# Patient Record
Sex: Male | Born: 1940
Health system: Southern US, Community
[De-identification: ages and names within clinical notes are randomized; demographics above are authoritative.]

## PROBLEM LIST (undated history)

## (undated) DIAGNOSIS — G5620 Lesion of ulnar nerve, unspecified upper limb: Secondary | ICD-10-CM

## (undated) DIAGNOSIS — R0789 Other chest pain: Secondary | ICD-10-CM

## (undated) DIAGNOSIS — L0291 Cutaneous abscess, unspecified: Secondary | ICD-10-CM

## (undated) DIAGNOSIS — R935 Abnormal findings on diagnostic imaging of other abdominal regions, including retroperitoneum: Secondary | ICD-10-CM

## (undated) DIAGNOSIS — D649 Anemia, unspecified: Secondary | ICD-10-CM

## (undated) DIAGNOSIS — Z136 Encounter for screening for cardiovascular disorders: Secondary | ICD-10-CM

## (undated) DIAGNOSIS — M1990 Unspecified osteoarthritis, unspecified site: Secondary | ICD-10-CM

## (undated) DIAGNOSIS — M6200 Separation of muscle (nontraumatic), unspecified site: Secondary | ICD-10-CM

## (undated) DIAGNOSIS — S53106A Unspecified dislocation of unspecified ulnohumeral joint, initial encounter: Secondary | ICD-10-CM

## (undated) DIAGNOSIS — H269 Unspecified cataract: Secondary | ICD-10-CM

## (undated) DIAGNOSIS — L0390 Cellulitis, unspecified: Secondary | ICD-10-CM

## (undated) DIAGNOSIS — E119 Type 2 diabetes mellitus without complications: Secondary | ICD-10-CM

## (undated) DIAGNOSIS — Z9889 Other specified postprocedural states: Secondary | ICD-10-CM

## (undated) DIAGNOSIS — T783XXA Angioneurotic edema, initial encounter: Secondary | ICD-10-CM

## (undated) DIAGNOSIS — I10 Essential (primary) hypertension: Secondary | ICD-10-CM

## (undated) DIAGNOSIS — Z01818 Encounter for other preprocedural examination: Secondary | ICD-10-CM

## (undated) HISTORY — DX: Cellulitis, unspecified: L03.90

## (undated) HISTORY — DX: Essential (primary) hypertension: I10

## (undated) HISTORY — DX: Unspecified dislocation of unspecified ulnohumeral joint, initial encounter: S53.106A

## (undated) HISTORY — DX: Encounter for other preprocedural examination: Z01.818

## (undated) HISTORY — DX: Other specified postprocedural states: Z98.890

## (undated) HISTORY — DX: Cutaneous abscess, unspecified: L02.91

## (undated) HISTORY — DX: Abnormal findings on diagnostic imaging of other abdominal regions, including retroperitoneum: R93.5

## (undated) HISTORY — DX: Angioneurotic edema, initial encounter: T78.3XXA

## (undated) HISTORY — DX: Separation of muscle (nontraumatic), unspecified site: M62.00

## (undated) HISTORY — DX: Other chest pain: R07.89

## (undated) HISTORY — DX: Lesion of ulnar nerve, unspecified upper limb: G56.20

## (undated) HISTORY — DX: Unspecified osteoarthritis, unspecified site: M19.90

## (undated) HISTORY — DX: Anemia, unspecified: D64.9

## (undated) HISTORY — DX: Encounter for screening for cardiovascular disorders: Z13.6

## (undated) HISTORY — DX: Unspecified cataract: H26.9

---
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
## 1968-04-29 HISTORY — PX: INGUINAL HERNIA REPAIR: SUR1180

## 1996-08-27 HISTORY — PX: ANTERIOR CRUCIATE LIGAMENT REPAIR: SHX115

## 1997-09-06 ENCOUNTER — Encounter: Admission: RE | Admit: 1997-09-06 | Discharge: 1997-09-06 | Payer: Self-pay | Admitting: Family Medicine

## 1998-01-04 ENCOUNTER — Encounter: Admission: RE | Admit: 1998-01-04 | Discharge: 1998-01-04 | Payer: Self-pay | Admitting: Family Medicine

## 1998-02-21 ENCOUNTER — Encounter: Admission: RE | Admit: 1998-02-21 | Discharge: 1998-02-21 | Payer: Self-pay | Admitting: Sports Medicine

## 1998-04-11 ENCOUNTER — Encounter: Admission: RE | Admit: 1998-04-11 | Discharge: 1998-04-11 | Payer: Self-pay | Admitting: Family Medicine

## 1998-07-05 ENCOUNTER — Encounter: Admission: RE | Admit: 1998-07-05 | Discharge: 1998-07-05 | Payer: Self-pay | Admitting: Family Medicine

## 1999-08-03 ENCOUNTER — Encounter: Admission: RE | Admit: 1999-08-03 | Discharge: 1999-08-03 | Payer: Self-pay | Admitting: Family Medicine

## 1999-08-07 ENCOUNTER — Encounter: Admission: RE | Admit: 1999-08-07 | Discharge: 1999-08-07 | Payer: Self-pay | Admitting: Family Medicine

## 1999-08-07 ENCOUNTER — Ambulatory Visit (HOSPITAL_COMMUNITY): Admission: RE | Admit: 1999-08-07 | Discharge: 1999-08-07 | Payer: Self-pay | Admitting: Family Medicine

## 1999-08-17 ENCOUNTER — Emergency Department (HOSPITAL_COMMUNITY): Admission: EM | Admit: 1999-08-17 | Discharge: 1999-08-17 | Payer: Self-pay

## 1999-08-23 ENCOUNTER — Encounter: Admission: RE | Admit: 1999-08-23 | Discharge: 1999-08-23 | Payer: Self-pay | Admitting: Family Medicine

## 1999-09-05 ENCOUNTER — Encounter: Admission: RE | Admit: 1999-09-05 | Discharge: 1999-09-05 | Payer: Self-pay | Admitting: Family Medicine

## 1999-09-19 ENCOUNTER — Encounter: Admission: RE | Admit: 1999-09-19 | Discharge: 1999-09-19 | Payer: Self-pay | Admitting: Family Medicine

## 2000-01-01 ENCOUNTER — Encounter: Admission: RE | Admit: 2000-01-01 | Discharge: 2000-01-01 | Payer: Self-pay | Admitting: Family Medicine

## 2000-02-26 ENCOUNTER — Encounter: Admission: RE | Admit: 2000-02-26 | Discharge: 2000-02-26 | Payer: Self-pay | Admitting: Family Medicine

## 2000-05-06 ENCOUNTER — Encounter: Admission: RE | Admit: 2000-05-06 | Discharge: 2000-05-06 | Payer: Self-pay | Admitting: Family Medicine

## 2000-11-21 ENCOUNTER — Encounter: Admission: RE | Admit: 2000-11-21 | Discharge: 2000-11-21 | Payer: Self-pay | Admitting: Family Medicine

## 2001-01-13 ENCOUNTER — Encounter: Admission: RE | Admit: 2001-01-13 | Discharge: 2001-01-13 | Payer: Self-pay | Admitting: Family Medicine

## 2001-05-12 ENCOUNTER — Encounter: Admission: RE | Admit: 2001-05-12 | Discharge: 2001-05-12 | Payer: Self-pay | Admitting: Family Medicine

## 2001-05-26 ENCOUNTER — Encounter: Admission: RE | Admit: 2001-05-26 | Discharge: 2001-05-26 | Payer: Self-pay | Admitting: Family Medicine

## 2001-07-14 ENCOUNTER — Encounter: Admission: RE | Admit: 2001-07-14 | Discharge: 2001-07-14 | Payer: Self-pay | Admitting: Family Medicine

## 2001-08-18 ENCOUNTER — Encounter: Admission: RE | Admit: 2001-08-18 | Discharge: 2001-08-18 | Payer: Self-pay | Admitting: Family Medicine

## 2002-03-11 ENCOUNTER — Encounter: Admission: RE | Admit: 2002-03-11 | Discharge: 2002-03-11 | Payer: Self-pay | Admitting: Family Medicine

## 2002-06-22 ENCOUNTER — Encounter: Admission: RE | Admit: 2002-06-22 | Discharge: 2002-06-22 | Payer: Self-pay | Admitting: Family Medicine

## 2002-06-28 DIAGNOSIS — Z136 Encounter for screening for cardiovascular disorders: Secondary | ICD-10-CM

## 2002-06-28 HISTORY — DX: Encounter for screening for cardiovascular disorders: Z13.6

## 2002-11-30 ENCOUNTER — Encounter: Admission: RE | Admit: 2002-11-30 | Discharge: 2002-11-30 | Payer: Self-pay | Admitting: Sports Medicine

## 2002-12-02 ENCOUNTER — Encounter: Admission: RE | Admit: 2002-12-02 | Discharge: 2002-12-02 | Payer: Self-pay | Admitting: Family Medicine

## 2003-01-05 ENCOUNTER — Encounter: Admission: RE | Admit: 2003-01-05 | Discharge: 2003-01-05 | Payer: Self-pay | Admitting: Family Medicine

## 2003-02-16 ENCOUNTER — Encounter: Admission: RE | Admit: 2003-02-16 | Discharge: 2003-02-16 | Payer: Self-pay | Admitting: Family Medicine

## 2003-08-09 ENCOUNTER — Encounter: Admission: RE | Admit: 2003-08-09 | Discharge: 2003-08-09 | Payer: Self-pay | Admitting: Family Medicine

## 2003-09-06 ENCOUNTER — Encounter: Admission: RE | Admit: 2003-09-06 | Discharge: 2003-09-06 | Payer: Self-pay | Admitting: Family Medicine

## 2004-02-28 DIAGNOSIS — R935 Abnormal findings on diagnostic imaging of other abdominal regions, including retroperitoneum: Secondary | ICD-10-CM

## 2004-02-28 HISTORY — DX: Abnormal findings on diagnostic imaging of other abdominal regions, including retroperitoneum: R93.5

## 2004-03-13 ENCOUNTER — Ambulatory Visit: Payer: Self-pay | Admitting: Family Medicine

## 2004-03-14 ENCOUNTER — Encounter: Admission: RE | Admit: 2004-03-14 | Discharge: 2004-03-14 | Payer: Self-pay | Admitting: Family Medicine

## 2004-04-03 ENCOUNTER — Ambulatory Visit: Payer: Self-pay | Admitting: Sports Medicine

## 2004-04-17 ENCOUNTER — Ambulatory Visit: Payer: Self-pay | Admitting: Family Medicine

## 2004-05-29 ENCOUNTER — Ambulatory Visit: Payer: Self-pay | Admitting: Family Medicine

## 2004-06-08 ENCOUNTER — Ambulatory Visit (HOSPITAL_COMMUNITY): Admission: RE | Admit: 2004-06-08 | Discharge: 2004-06-08 | Payer: Self-pay | Admitting: Gastroenterology

## 2004-07-13 ENCOUNTER — Ambulatory Visit: Payer: Self-pay | Admitting: Family Medicine

## 2004-07-17 ENCOUNTER — Ambulatory Visit: Payer: Self-pay | Admitting: Family Medicine

## 2004-10-22 DIAGNOSIS — Z01818 Encounter for other preprocedural examination: Secondary | ICD-10-CM

## 2004-10-22 HISTORY — DX: Encounter for other preprocedural examination: Z01.818

## 2004-11-13 ENCOUNTER — Ambulatory Visit: Payer: Self-pay | Admitting: Family Medicine

## 2005-02-27 HISTORY — PX: BASAL CELL CARCINOMA EXCISION: SHX1214

## 2005-03-29 HISTORY — PX: BLEPHAROPLASTY: SUR158

## 2005-04-02 ENCOUNTER — Ambulatory Visit: Payer: Self-pay | Admitting: Family Medicine

## 2005-06-04 ENCOUNTER — Ambulatory Visit: Payer: Self-pay | Admitting: Family Medicine

## 2005-10-08 ENCOUNTER — Ambulatory Visit: Payer: Self-pay | Admitting: Family Medicine

## 2005-12-02 ENCOUNTER — Ambulatory Visit (HOSPITAL_BASED_OUTPATIENT_CLINIC_OR_DEPARTMENT_OTHER): Admission: RE | Admit: 2005-12-02 | Discharge: 2005-12-02 | Payer: Self-pay | Admitting: Surgery

## 2005-12-13 ENCOUNTER — Ambulatory Visit: Payer: Self-pay | Admitting: Family Medicine

## 2006-01-22 ENCOUNTER — Ambulatory Visit: Payer: Self-pay | Admitting: Family Medicine

## 2006-03-05 ENCOUNTER — Encounter: Admission: RE | Admit: 2006-03-05 | Discharge: 2006-03-05 | Payer: Self-pay | Admitting: Otolaryngology

## 2006-03-29 LAB — CONVERTED CEMR LAB: PSA: NORMAL ng/mL

## 2006-04-08 ENCOUNTER — Ambulatory Visit: Payer: Self-pay | Admitting: Family Medicine

## 2006-06-26 DIAGNOSIS — N401 Enlarged prostate with lower urinary tract symptoms: Secondary | ICD-10-CM | POA: Insufficient documentation

## 2006-06-26 DIAGNOSIS — E1143 Type 2 diabetes mellitus with diabetic autonomic (poly)neuropathy: Secondary | ICD-10-CM | POA: Insufficient documentation

## 2006-06-26 DIAGNOSIS — K219 Gastro-esophageal reflux disease without esophagitis: Secondary | ICD-10-CM | POA: Insufficient documentation

## 2006-06-26 DIAGNOSIS — D649 Anemia, unspecified: Secondary | ICD-10-CM | POA: Insufficient documentation

## 2006-06-26 DIAGNOSIS — E7800 Pure hypercholesterolemia, unspecified: Secondary | ICD-10-CM | POA: Insufficient documentation

## 2006-06-26 DIAGNOSIS — T783XXA Angioneurotic edema, initial encounter: Secondary | ICD-10-CM | POA: Insufficient documentation

## 2006-07-01 ENCOUNTER — Ambulatory Visit: Payer: Self-pay | Admitting: Family Medicine

## 2006-07-01 LAB — CONVERTED CEMR LAB: Hgb A1c MFr Bld: 5.3 %

## 2006-07-31 ENCOUNTER — Telehealth: Payer: Self-pay | Admitting: *Deleted

## 2006-11-11 ENCOUNTER — Telehealth: Payer: Self-pay | Admitting: Family Medicine

## 2006-11-14 ENCOUNTER — Ambulatory Visit: Payer: Self-pay | Admitting: Family Medicine

## 2006-11-17 LAB — CONVERTED CEMR LAB
ALT: 19 units/L (ref 0–53)
AST: 20 units/L (ref 0–37)
Albumin: 4.6 g/dL (ref 3.5–5.2)
Alkaline Phosphatase: 46 units/L (ref 39–117)
BUN: 20 mg/dL (ref 6–23)
CO2: 25 meq/L (ref 19–32)
Calcium: 9.3 mg/dL (ref 8.4–10.5)
Chloride: 106 meq/L (ref 96–112)
Cholesterol: 175 mg/dL (ref 0–200)
Creatinine, Ser: 1.29 mg/dL (ref 0.40–1.50)
Folate: >20 ng/mL
Glucose, Bld: 117 mg/dL — ABNORMAL HIGH (ref 70–99)
HCT: 34.4 % — ABNORMAL LOW (ref 39.0–52.0)
HDL: 39 mg/dL — ABNORMAL LOW (ref >39)
Hemoglobin: 11.5 g/dL — ABNORMAL LOW (ref 13.0–17.0)
LDL Cholesterol: 65 mg/dL (ref 0–99)
MCHC: 33.4 g/dL (ref 30.0–36.0)
MCV: 98.6 fL (ref 78.0–100.0)
Platelets: 164 10*3/uL (ref 150–400)
Potassium: 4.1 meq/L (ref 3.5–5.3)
RBC: 3.49 M/uL — ABNORMAL LOW (ref 4.22–5.81)
RDW: 14.8 % — ABNORMAL HIGH (ref 11.5–14.0)
Retic Count, Absolute: 62.8 (ref 19.0–186.0)
Retic Ct Pct: 1.8 % (ref 0.4–3.1)
Sodium: 140 meq/L (ref 135–145)
TSH: 1.185 microintl units/mL (ref 0.350–5.50)
Total Bilirubin: 0.7 mg/dL (ref 0.3–1.2)
Total CHOL/HDL Ratio: 4.5
Total Protein: 7.1 g/dL (ref 6.0–8.3)
Triglycerides: 355 mg/dL — ABNORMAL HIGH (ref <150)
VLDL: 71 mg/dL — ABNORMAL HIGH (ref 0–40)
Vitamin B-12: 166 pg/mL — ABNORMAL LOW (ref 211–911)
WBC: 3.8 10*3/uL — ABNORMAL LOW (ref 4.0–10.5)

## 2006-11-18 ENCOUNTER — Ambulatory Visit: Payer: Self-pay | Admitting: Family Medicine

## 2006-11-18 ENCOUNTER — Encounter: Payer: Self-pay | Admitting: *Deleted

## 2006-11-18 ENCOUNTER — Encounter: Payer: Self-pay | Admitting: Family Medicine

## 2006-11-18 LAB — CONVERTED CEMR LAB
Homocysteine: 12.2 micromoles/L (ref 4.0–15.4)
Vitamin B-12: 227 pg/mL (ref 211–911)

## 2006-12-02 ENCOUNTER — Ambulatory Visit: Payer: Self-pay | Admitting: Family Medicine

## 2006-12-02 LAB — CONVERTED CEMR LAB: Hgb A1c MFr Bld: 5.4 %

## 2006-12-03 ENCOUNTER — Encounter: Payer: Self-pay | Admitting: Family Medicine

## 2006-12-03 ENCOUNTER — Telehealth: Payer: Self-pay | Admitting: *Deleted

## 2006-12-04 ENCOUNTER — Ambulatory Visit: Payer: Self-pay | Admitting: Oncology

## 2006-12-25 ENCOUNTER — Encounter: Payer: Self-pay | Admitting: Family Medicine

## 2006-12-25 LAB — CBC WITH DIFFERENTIAL/PLATELET
BASO%: 0.4 % (ref 0.0–2.0)
Basophils Absolute: 0 10*3/uL (ref 0.0–0.1)
EOS%: 1.8 % (ref 0.0–7.0)
Eosinophils Absolute: 0.1 10*3/uL (ref 0.0–0.5)
HCT: 30.9 % — ABNORMAL LOW (ref 38.7–49.9)
HGB: 11.2 g/dL — ABNORMAL LOW (ref 13.0–17.1)
LYMPH%: 32.2 % (ref 14.0–48.0)
MCH: 34 pg — ABNORMAL HIGH (ref 28.0–33.4)
MCHC: 36.3 g/dL — ABNORMAL HIGH (ref 32.0–35.9)
MCV: 93.7 fL (ref 81.6–98.0)
MONO#: 0.3 10*3/uL (ref 0.1–0.9)
MONO%: 8.1 % (ref 0.0–13.0)
NEUT#: 2.2 10*3/uL (ref 1.5–6.5)
NEUT%: 57.5 % (ref 40.0–75.0)
Platelets: 150 10*3/uL (ref 145–400)
RBC: 3.3 10*6/uL — ABNORMAL LOW (ref 4.20–5.71)
RDW: 13.4 % (ref 11.2–14.6)
WBC: 3.8 10*3/uL — ABNORMAL LOW (ref 4.0–10.0)
lymph#: 1.2 10*3/uL (ref 0.9–3.3)

## 2006-12-25 LAB — CHCC SMEAR

## 2006-12-30 LAB — COMPREHENSIVE METABOLIC PANEL
ALT: 23 U/L (ref 0–53)
AST: 22 U/L (ref 0–37)
Albumin: 4.5 g/dL (ref 3.5–5.2)
Alkaline Phosphatase: 46 U/L (ref 39–117)
BUN: 18 mg/dL (ref 6–23)
CO2: 25 mEq/L (ref 19–32)
Calcium: 9 mg/dL (ref 8.4–10.5)
Chloride: 107 mEq/L (ref 96–112)
Creatinine, Ser: 1.18 mg/dL (ref 0.40–1.50)
Glucose, Bld: 149 mg/dL — ABNORMAL HIGH (ref 70–99)
Potassium: 3.8 mEq/L (ref 3.5–5.3)
Sodium: 143 mEq/L (ref 135–145)
Total Bilirubin: 0.7 mg/dL (ref 0.3–1.2)
Total Protein: 6.7 g/dL (ref 6.0–8.3)

## 2006-12-30 LAB — IRON AND TIBC
%SAT: 28 % (ref 20–55)
Iron: 90 ug/dL (ref 42–165)
TIBC: 326 ug/dL (ref 215–435)
UIBC: 236 ug/dL

## 2006-12-30 LAB — IMMUNOFIXATION ELECTROPHORESIS
IgA: 118 mg/dL (ref 68–378)
IgG (Immunoglobin G), Serum: 901 mg/dL (ref 694–1618)
IgM, Serum: 112 mg/dL (ref 60–263)
Total Protein, Serum Electrophoresis: 6.7 g/dL (ref 6.0–8.3)

## 2006-12-30 LAB — LACTATE DEHYDROGENASE: LDH: 141 U/L (ref 94–250)

## 2006-12-30 LAB — VITAMIN B12: Vitamin B-12: 187 pg/mL — ABNORMAL LOW (ref 211–911)

## 2006-12-30 LAB — SEDIMENTATION RATE: Sed Rate: 9 mm/hr (ref 0–16)

## 2006-12-30 LAB — FERRITIN: Ferritin: 63 ng/mL (ref 22–322)

## 2006-12-30 LAB — FOLATE RBC: RBC Folate: 842 ng/mL — ABNORMAL HIGH (ref 180–600)

## 2007-01-14 ENCOUNTER — Ambulatory Visit: Payer: Self-pay | Admitting: Oncology

## 2007-02-03 ENCOUNTER — Encounter: Payer: Self-pay | Admitting: Family Medicine

## 2007-02-03 LAB — CBC WITH DIFFERENTIAL/PLATELET
BASO%: 0.9 % (ref 0.0–2.0)
Basophils Absolute: 0 10*3/uL (ref 0.0–0.1)
EOS%: 1.6 % (ref 0.0–7.0)
Eosinophils Absolute: 0.1 10*3/uL (ref 0.0–0.5)
HCT: 29.6 % — ABNORMAL LOW (ref 38.7–49.9)
HGB: 10.9 g/dL — ABNORMAL LOW (ref 13.0–17.1)
LYMPH%: 36.3 % (ref 14.0–48.0)
MCH: 34.5 pg — ABNORMAL HIGH (ref 28.0–33.4)
MCHC: 36.8 g/dL — ABNORMAL HIGH (ref 32.0–35.9)
MCV: 93.6 fL (ref 81.6–98.0)
MONO#: 0.3 10*3/uL (ref 0.1–0.9)
MONO%: 7.9 % (ref 0.0–13.0)
NEUT#: 1.9 10*3/uL (ref 1.5–6.5)
NEUT%: 53.3 % (ref 40.0–75.0)
Platelets: 154 10*3/uL (ref 145–400)
RBC: 3.16 10*6/uL — ABNORMAL LOW (ref 4.20–5.71)
RDW: 13.7 % (ref 11.2–14.6)
WBC: 3.7 10*3/uL — ABNORMAL LOW (ref 4.0–10.0)
lymph#: 1.3 10*3/uL (ref 0.9–3.3)

## 2007-02-03 LAB — VITAMIN B12: Vitamin B-12: 435 pg/mL (ref 211–911)

## 2007-02-10 ENCOUNTER — Other Ambulatory Visit: Admission: RE | Admit: 2007-02-10 | Discharge: 2007-02-10 | Payer: Self-pay | Admitting: Oncology

## 2007-02-10 ENCOUNTER — Encounter (HOSPITAL_COMMUNITY): Payer: Self-pay | Admitting: Oncology

## 2007-02-24 ENCOUNTER — Ambulatory Visit: Payer: Self-pay | Admitting: Family Medicine

## 2007-02-24 LAB — CONVERTED CEMR LAB
Cholesterol, target level: 200 mg/dL
HDL goal, serum: 40 mg/dL
LDL Goal: 100 mg/dL

## 2007-03-03 ENCOUNTER — Ambulatory Visit: Payer: Self-pay | Admitting: Oncology

## 2007-03-05 ENCOUNTER — Encounter: Payer: Self-pay | Admitting: Family Medicine

## 2007-03-05 LAB — CBC WITH DIFFERENTIAL/PLATELET
BASO%: 0.6 % (ref 0.0–2.0)
Basophils Absolute: 0 10*3/uL (ref 0.0–0.1)
EOS%: 1.2 % (ref 0.0–7.0)
Eosinophils Absolute: 0 10*3/uL (ref 0.0–0.5)
HCT: 31.3 % — ABNORMAL LOW (ref 38.7–49.9)
HGB: 11.4 g/dL — ABNORMAL LOW (ref 13.0–17.1)
LYMPH%: 35.7 % (ref 14.0–48.0)
MCH: 34 pg — ABNORMAL HIGH (ref 28.0–33.4)
MCHC: 36.4 g/dL — ABNORMAL HIGH (ref 32.0–35.9)
MCV: 93.4 fL (ref 81.6–98.0)
MONO#: 0.2 10*3/uL (ref 0.1–0.9)
MONO%: 6.2 % (ref 0.0–13.0)
NEUT#: 2.3 10*3/uL (ref 1.5–6.5)
NEUT%: 56.3 % (ref 40.0–75.0)
Platelets: 157 10*3/uL (ref 145–400)
RBC: 3.35 10*6/uL — ABNORMAL LOW (ref 4.20–5.71)
RDW: 13.1 % (ref 11.2–14.6)
WBC: 4 10*3/uL (ref 4.0–10.0)
lymph#: 1.4 10*3/uL (ref 0.9–3.3)

## 2007-03-05 LAB — COMPREHENSIVE METABOLIC PANEL
ALT: 19 U/L (ref 0–53)
AST: 17 U/L (ref 0–37)
Albumin: 4.5 g/dL (ref 3.5–5.2)
Alkaline Phosphatase: 38 U/L — ABNORMAL LOW (ref 39–117)
BUN: 19 mg/dL (ref 6–23)
CO2: 22 mEq/L (ref 19–32)
Calcium: 9.7 mg/dL (ref 8.4–10.5)
Chloride: 102 mEq/L (ref 96–112)
Creatinine, Ser: 1.27 mg/dL (ref 0.40–1.50)
Glucose, Bld: 130 mg/dL — ABNORMAL HIGH (ref 70–99)
Potassium: 4 mEq/L (ref 3.5–5.3)
Sodium: 140 mEq/L (ref 135–145)
Total Bilirubin: 0.8 mg/dL (ref 0.3–1.2)
Total Protein: 7.1 g/dL (ref 6.0–8.3)

## 2007-03-05 LAB — CONVERTED CEMR LAB
HCT: 31.3 %
Hemoglobin: 11.4 g/dL
MCV: 93.4 fL
Neutrophils Relative %: 56.3 %
Platelets: 157 10*3/uL
RBC: 3.35 M/uL
RDW: 13.1 %
WBC: 4 10*3/uL

## 2007-03-05 LAB — VITAMIN B12: Vitamin B-12: 341 pg/mL (ref 211–911)

## 2007-03-08 LAB — GLIA (IGA/G) + TTG IGA
Gliadin IgA: 0 U/mL (ref <7)
Gliadin IgG: 1 U/mL (ref <7)
Reticulin Antibody, IgA: <1:10 {titer}
Tissue Transglutaminase Ab, IgA: 0 U/mL (ref <7)

## 2007-03-11 LAB — FECAL OCCULT BLOOD, GUAIAC: Occult Blood: NEGATIVE

## 2007-03-19 ENCOUNTER — Encounter: Payer: Self-pay | Admitting: Family Medicine

## 2007-04-16 ENCOUNTER — Ambulatory Visit: Payer: Self-pay | Admitting: Oncology

## 2007-04-20 LAB — CBC WITH DIFFERENTIAL/PLATELET
BASO%: 0.7 % (ref 0.0–2.0)
Basophils Absolute: 0 10*3/uL (ref 0.0–0.1)
EOS%: 1.9 % (ref 0.0–7.0)
Eosinophils Absolute: 0.1 10*3/uL (ref 0.0–0.5)
HCT: 34.9 % — ABNORMAL LOW (ref 38.7–49.9)
HGB: 12.2 g/dL — ABNORMAL LOW (ref 13.0–17.1)
LYMPH%: 38.6 % (ref 14.0–48.0)
MCH: 33.1 pg (ref 28.0–33.4)
MCHC: 35.1 g/dL (ref 32.0–35.9)
MCV: 94.3 fL (ref 81.6–98.0)
MONO#: 0.3 10*3/uL (ref 0.1–0.9)
MONO%: 6.7 % (ref 0.0–13.0)
NEUT#: 2 10*3/uL (ref 1.5–6.5)
NEUT%: 52.1 % (ref 40.0–75.0)
Platelets: 139 10*3/uL — ABNORMAL LOW (ref 145–400)
RBC: 3.7 10*6/uL — ABNORMAL LOW (ref 4.20–5.71)
RDW: 11.8 % (ref 11.2–14.6)
WBC: 3.8 10*3/uL — ABNORMAL LOW (ref 4.0–10.0)
lymph#: 1.5 10*3/uL (ref 0.9–3.3)

## 2007-05-06 LAB — CYTOGENETICS, PERIPHERAL BLOOD

## 2007-05-12 ENCOUNTER — Encounter: Payer: Self-pay | Admitting: Family Medicine

## 2007-05-12 LAB — CBC WITH DIFFERENTIAL/PLATELET
BASO%: 0.6 % (ref 0.0–2.0)
Basophils Absolute: 0 10*3/uL (ref 0.0–0.1)
EOS%: 1.3 % (ref 0.0–7.0)
Eosinophils Absolute: 0.1 10*3/uL (ref 0.0–0.5)
HCT: 34.6 % — ABNORMAL LOW (ref 38.7–49.9)
HGB: 12.3 g/dL — ABNORMAL LOW (ref 13.0–17.1)
LYMPH%: 34.9 % (ref 14.0–48.0)
MCH: 33.2 pg (ref 28.0–33.4)
MCHC: 35.5 g/dL (ref 32.0–35.9)
MCV: 93.5 fL (ref 81.6–98.0)
MONO#: 0.4 10*3/uL (ref 0.1–0.9)
MONO%: 9.6 % (ref 0.0–13.0)
NEUT#: 2.4 10*3/uL (ref 1.5–6.5)
NEUT%: 53.6 % (ref 40.0–75.0)
Platelets: 164 10*3/uL (ref 145–400)
RBC: 3.7 10*6/uL — ABNORMAL LOW (ref 4.20–5.71)
RDW: 13.4 % (ref 11.2–14.6)
WBC: 4.4 10*3/uL (ref 4.0–10.0)
lymph#: 1.5 10*3/uL (ref 0.9–3.3)

## 2007-05-12 LAB — COMPREHENSIVE METABOLIC PANEL
ALT: 33 U/L (ref 0–53)
AST: 23 U/L (ref 0–37)
Albumin: 4.6 g/dL (ref 3.5–5.2)
Alkaline Phosphatase: 50 U/L (ref 39–117)
BUN: 18 mg/dL (ref 6–23)
CO2: 25 mEq/L (ref 19–32)
Calcium: 9.1 mg/dL (ref 8.4–10.5)
Chloride: 103 mEq/L (ref 96–112)
Creatinine, Ser: 1.27 mg/dL (ref 0.40–1.50)
Glucose, Bld: 147 mg/dL — ABNORMAL HIGH (ref 70–99)
Potassium: 3.7 mEq/L (ref 3.5–5.3)
Sodium: 140 mEq/L (ref 135–145)
Total Bilirubin: 0.6 mg/dL (ref 0.3–1.2)
Total Protein: 7.2 g/dL (ref 6.0–8.3)

## 2007-05-12 LAB — IRON AND TIBC
%SAT: 33 % (ref 20–55)
Iron: 111 ug/dL (ref 42–165)
TIBC: 333 ug/dL (ref 215–435)
UIBC: 222 ug/dL

## 2007-05-12 LAB — FERRITIN: Ferritin: 1304 ng/mL — ABNORMAL HIGH (ref 22–322)

## 2007-05-12 LAB — LACTATE DEHYDROGENASE: LDH: 140 U/L (ref 94–250)

## 2007-05-12 LAB — VITAMIN B12: Vitamin B-12: 298 pg/mL (ref 211–911)

## 2007-06-16 ENCOUNTER — Ambulatory Visit: Payer: Self-pay | Admitting: Family Medicine

## 2007-06-16 LAB — CONVERTED CEMR LAB: Hgb A1c MFr Bld: 5.8 %

## 2007-06-24 ENCOUNTER — Encounter: Admission: RE | Admit: 2007-06-24 | Discharge: 2007-06-24 | Payer: Self-pay | Admitting: Family Medicine

## 2007-06-30 ENCOUNTER — Telehealth: Payer: Self-pay | Admitting: *Deleted

## 2007-06-30 ENCOUNTER — Encounter: Payer: Self-pay | Admitting: *Deleted

## 2007-07-01 ENCOUNTER — Ambulatory Visit: Payer: Self-pay | Admitting: Family Medicine

## 2007-07-01 LAB — CONVERTED CEMR LAB
BUN: 23 mg/dL (ref 6–23)
Creatinine, Ser: 1.25 mg/dL (ref 0.40–1.50)

## 2007-07-02 ENCOUNTER — Encounter (INDEPENDENT_AMBULATORY_CARE_PROVIDER_SITE_OTHER): Payer: Self-pay | Admitting: *Deleted

## 2007-07-03 ENCOUNTER — Encounter: Admission: RE | Admit: 2007-07-03 | Discharge: 2007-07-03 | Payer: Self-pay | Admitting: Family Medicine

## 2007-07-11 ENCOUNTER — Encounter: Payer: Self-pay | Admitting: Family Medicine

## 2007-07-14 ENCOUNTER — Encounter: Payer: Self-pay | Admitting: Family Medicine

## 2007-07-15 ENCOUNTER — Encounter: Payer: Self-pay | Admitting: Family Medicine

## 2007-07-16 ENCOUNTER — Encounter: Payer: Self-pay | Admitting: Family Medicine

## 2007-07-16 ENCOUNTER — Telehealth: Payer: Self-pay | Admitting: *Deleted

## 2007-08-05 ENCOUNTER — Ambulatory Visit: Payer: Self-pay | Admitting: Oncology

## 2007-08-10 ENCOUNTER — Ambulatory Visit (HOSPITAL_COMMUNITY): Admission: RE | Admit: 2007-08-10 | Discharge: 2007-08-10 | Payer: Self-pay | Admitting: Oncology

## 2007-08-10 ENCOUNTER — Encounter: Payer: Self-pay | Admitting: Family Medicine

## 2007-08-10 LAB — CBC WITH DIFFERENTIAL/PLATELET
BASO%: 0.4 % (ref 0.0–2.0)
Basophils Absolute: 0 10*3/uL (ref 0.0–0.1)
EOS%: 1.6 % (ref 0.0–7.0)
Eosinophils Absolute: 0.1 10*3/uL (ref 0.0–0.5)
HCT: 31.3 % — ABNORMAL LOW (ref 38.7–49.9)
HGB: 11.3 g/dL — ABNORMAL LOW (ref 13.0–17.1)
LYMPH%: 33.6 % (ref 14.0–48.0)
MCH: 34 pg — ABNORMAL HIGH (ref 28.0–33.4)
MCHC: 36.1 g/dL — ABNORMAL HIGH (ref 32.0–35.9)
MCV: 94 fL (ref 81.6–98.0)
MONO#: 0.4 10*3/uL (ref 0.1–0.9)
MONO%: 9.1 % (ref 0.0–13.0)
NEUT#: 2.1 10*3/uL (ref 1.5–6.5)
NEUT%: 55.3 % (ref 40.0–75.0)
Platelets: 149 10*3/uL (ref 145–400)
RBC: 3.33 10*6/uL — ABNORMAL LOW (ref 4.20–5.71)
RDW: 13.4 % (ref 11.2–14.6)
WBC: 3.9 10*3/uL — ABNORMAL LOW (ref 4.0–10.0)
lymph#: 1.3 10*3/uL (ref 0.9–3.3)

## 2007-08-10 LAB — COMPREHENSIVE METABOLIC PANEL
ALT: 22 U/L (ref 0–53)
AST: 25 U/L (ref 0–37)
Albumin: 4.6 g/dL (ref 3.5–5.2)
Alkaline Phosphatase: 38 U/L — ABNORMAL LOW (ref 39–117)
BUN: 27 mg/dL — ABNORMAL HIGH (ref 6–23)
CO2: 23 mEq/L (ref 19–32)
Calcium: 9.4 mg/dL (ref 8.4–10.5)
Chloride: 103 mEq/L (ref 96–112)
Creatinine, Ser: 1.6 mg/dL — ABNORMAL HIGH (ref 0.40–1.50)
Glucose, Bld: 118 mg/dL — ABNORMAL HIGH (ref 70–99)
Potassium: 3.7 mEq/L (ref 3.5–5.3)
Sodium: 140 mEq/L (ref 135–145)
Total Bilirubin: 0.7 mg/dL (ref 0.3–1.2)
Total Protein: 7 g/dL (ref 6.0–8.3)

## 2007-08-10 LAB — VITAMIN B12: Vitamin B-12: 481 pg/mL (ref 211–911)

## 2007-08-10 LAB — CONVERTED CEMR LAB
BUN: 27 mg/dL
Creatinine, Ser: 1.6 mg/dL
Glucose, Bld: 118 mg/dL
Vitamin B-12: 481 pg/mL

## 2007-08-10 LAB — IRON AND TIBC
%SAT: 24 % (ref 20–55)
Iron: 75 ug/dL (ref 42–165)
TIBC: 307 ug/dL (ref 215–435)
UIBC: 232 ug/dL

## 2007-08-10 LAB — FERRITIN: Ferritin: 657 ng/mL — ABNORMAL HIGH (ref 22–322)

## 2007-08-10 LAB — LACTATE DEHYDROGENASE: LDH: 160 U/L (ref 94–250)

## 2007-08-13 ENCOUNTER — Encounter: Payer: Self-pay | Admitting: Family Medicine

## 2007-08-13 DIAGNOSIS — Z9889 Other specified postprocedural states: Secondary | ICD-10-CM

## 2007-08-13 HISTORY — DX: Other specified postprocedural states: Z98.890

## 2007-08-17 ENCOUNTER — Encounter: Payer: Self-pay | Admitting: Family Medicine

## 2007-11-04 ENCOUNTER — Ambulatory Visit: Payer: Self-pay | Admitting: Oncology

## 2007-11-09 ENCOUNTER — Encounter: Payer: Self-pay | Admitting: Family Medicine

## 2007-11-09 LAB — CONVERTED CEMR LAB
Creatinine, Ser: 1.6 mg/dL
Glucose, Bld: 118 mg/dL
HCT: 31.3 %
Hemoglobin: 11.3 g/dL
MCV: 94 fL
Platelets: 149 10*3/uL
WBC: 3.9 10*3/uL

## 2007-11-09 LAB — COMPREHENSIVE METABOLIC PANEL
ALT: 23 U/L (ref 0–53)
AST: 19 U/L (ref 0–37)
Albumin: 4.7 g/dL (ref 3.5–5.2)
Alkaline Phosphatase: 39 U/L (ref 39–117)
BUN: 27 mg/dL — ABNORMAL HIGH (ref 6–23)
CO2: 21 mEq/L (ref 19–32)
Calcium: 9 mg/dL (ref 8.4–10.5)
Chloride: 102 mEq/L (ref 96–112)
Creatinine, Ser: 1.66 mg/dL — ABNORMAL HIGH (ref 0.40–1.50)
Glucose, Bld: 149 mg/dL — ABNORMAL HIGH (ref 70–99)
Potassium: 3.7 mEq/L (ref 3.5–5.3)
Sodium: 140 mEq/L (ref 135–145)
Total Bilirubin: 0.9 mg/dL (ref 0.3–1.2)
Total Protein: 7 g/dL (ref 6.0–8.3)

## 2007-11-09 LAB — CBC WITH DIFFERENTIAL/PLATELET
BASO%: 0.4 % (ref 0.0–2.0)
Basophils Absolute: 0 10*3/uL (ref 0.0–0.1)
EOS%: 1.3 % (ref 0.0–7.0)
Eosinophils Absolute: 0.1 10*3/uL (ref 0.0–0.5)
HCT: 31.4 % — ABNORMAL LOW (ref 38.7–49.9)
HGB: 11.4 g/dL — ABNORMAL LOW (ref 13.0–17.1)
LYMPH%: 35 % (ref 14.0–48.0)
MCH: 34.1 pg — ABNORMAL HIGH (ref 28.0–33.4)
MCHC: 36.5 g/dL — ABNORMAL HIGH (ref 32.0–35.9)
MCV: 93.6 fL (ref 81.6–98.0)
MONO#: 0.4 10*3/uL (ref 0.1–0.9)
MONO%: 8.2 % (ref 0.0–13.0)
NEUT#: 2.4 10*3/uL (ref 1.5–6.5)
NEUT%: 55.1 % (ref 40.0–75.0)
Platelets: 165 10*3/uL (ref 145–400)
RBC: 3.35 10*6/uL — ABNORMAL LOW (ref 4.20–5.71)
RDW: 13.1 % (ref 11.2–14.6)
WBC: 4.4 10*3/uL (ref 4.0–10.0)
lymph#: 1.6 10*3/uL (ref 0.9–3.3)

## 2007-11-09 LAB — IRON AND TIBC
%SAT: 33 % (ref 20–55)
Iron: 107 ug/dL (ref 42–165)
TIBC: 324 ug/dL (ref 215–435)
UIBC: 217 ug/dL

## 2007-11-09 LAB — VITAMIN B12: Vitamin B-12: 412 pg/mL (ref 211–911)

## 2007-11-09 LAB — LACTATE DEHYDROGENASE: LDH: 152 U/L (ref 94–250)

## 2007-11-09 LAB — FERRITIN: Ferritin: 532 ng/mL — ABNORMAL HIGH (ref 22–322)

## 2007-11-10 ENCOUNTER — Encounter: Payer: Self-pay | Admitting: Family Medicine

## 2007-12-08 ENCOUNTER — Ambulatory Visit: Payer: Self-pay | Admitting: Family Medicine

## 2007-12-08 DIAGNOSIS — M6200 Separation of muscle (nontraumatic), unspecified site: Secondary | ICD-10-CM

## 2007-12-08 DIAGNOSIS — I10 Essential (primary) hypertension: Secondary | ICD-10-CM | POA: Insufficient documentation

## 2007-12-08 HISTORY — DX: Separation of muscle (nontraumatic), unspecified site: M62.00

## 2007-12-08 LAB — CONVERTED CEMR LAB: Hgb A1c MFr Bld: 5.6 %

## 2007-12-09 ENCOUNTER — Encounter: Payer: Self-pay | Admitting: Family Medicine

## 2007-12-09 DIAGNOSIS — E119 Type 2 diabetes mellitus without complications: Secondary | ICD-10-CM | POA: Insufficient documentation

## 2007-12-09 LAB — CONVERTED CEMR LAB
ALT: 19 units/L (ref 0–53)
AST: 19 units/L (ref 0–37)
Albumin: 4.5 g/dL (ref 3.5–5.2)
Alkaline Phosphatase: 40 units/L (ref 39–117)
BUN: 23 mg/dL (ref 6–23)
CO2: 25 meq/L (ref 19–32)
Calcium: 8.6 mg/dL (ref 8.4–10.5)
Chloride: 103 meq/L (ref 96–112)
Cholesterol: 140 mg/dL (ref 0–200)
Creatinine, Ser: 1.52 mg/dL — ABNORMAL HIGH (ref 0.40–1.50)
Glucose, Bld: 115 mg/dL — ABNORMAL HIGH (ref 70–99)
HDL: 39 mg/dL — ABNORMAL LOW (ref >39)
LDL Cholesterol: 49 mg/dL (ref 0–99)
Potassium: 3.8 meq/L (ref 3.5–5.3)
Sodium: 140 meq/L (ref 135–145)
Total Bilirubin: 0.7 mg/dL (ref 0.3–1.2)
Total CHOL/HDL Ratio: 3.6
Total Protein: 6.9 g/dL (ref 6.0–8.3)
Triglycerides: 260 mg/dL — ABNORMAL HIGH (ref <150)
VLDL: 52 mg/dL — ABNORMAL HIGH (ref 0–40)

## 2008-01-11 ENCOUNTER — Encounter: Payer: Self-pay | Admitting: Family Medicine

## 2008-01-20 ENCOUNTER — Encounter: Payer: Self-pay | Admitting: Family Medicine

## 2008-01-31 ENCOUNTER — Encounter: Payer: Self-pay | Admitting: Family Medicine

## 2008-06-07 ENCOUNTER — Ambulatory Visit: Payer: Self-pay | Admitting: Family Medicine

## 2008-06-07 DIAGNOSIS — G5620 Lesion of ulnar nerve, unspecified upper limb: Secondary | ICD-10-CM | POA: Insufficient documentation

## 2008-06-07 HISTORY — DX: Lesion of ulnar nerve, unspecified upper limb: G56.20

## 2008-06-07 LAB — CONVERTED CEMR LAB
BUN: 25 mg/dL — ABNORMAL HIGH (ref 6–23)
CO2: 24 meq/L (ref 19–32)
Calcium: 9.4 mg/dL (ref 8.4–10.5)
Chloride: 101 meq/L (ref 96–112)
Creatinine, Ser: 1.28 mg/dL (ref 0.40–1.50)
Glucose, Bld: 106 mg/dL — ABNORMAL HIGH (ref 70–99)
HCT: 31.1 % — ABNORMAL LOW (ref 39.0–52.0)
Hemoglobin: 10.3 g/dL — ABNORMAL LOW (ref 13.0–17.0)
Hgb A1c MFr Bld: 5.7 %
MCHC: 33.1 g/dL (ref 30.0–36.0)
MCV: 94.5 fL (ref 78.0–100.0)
Microalbumin U total vol: NORMAL mg/L
PSA: 3.67 ng/mL (ref 0.10–4.00)
Platelets: 204 10*3/uL (ref 150–400)
Potassium: 3.9 meq/L (ref 3.5–5.3)
RBC: 3.29 M/uL — ABNORMAL LOW (ref 4.22–5.81)
RDW: 13.9 % (ref 11.5–15.5)
Sodium: 139 meq/L (ref 135–145)
WBC: 5.2 10*3/uL (ref 4.0–10.5)

## 2008-06-08 ENCOUNTER — Encounter: Payer: Self-pay | Admitting: Family Medicine

## 2008-07-01 ENCOUNTER — Ambulatory Visit: Payer: Self-pay | Admitting: Family Medicine

## 2008-07-01 ENCOUNTER — Encounter: Payer: Self-pay | Admitting: Family Medicine

## 2008-07-01 LAB — CONVERTED CEMR LAB
Ferritin: 566 ng/mL — ABNORMAL HIGH (ref 22–322)
HCT: 30.3 % — ABNORMAL LOW (ref 39.0–52.0)
Hemoglobin: 10.5 g/dL — ABNORMAL LOW (ref 13.0–17.0)
Iron: 72 ug/dL (ref 42–165)
MCHC: 34.7 g/dL (ref 30.0–36.0)
MCV: 96.8 fL (ref 78.0–100.0)
Platelets: 129 10*3/uL — ABNORMAL LOW (ref 150–400)
RBC: 3.13 M/uL — ABNORMAL LOW (ref 4.22–5.81)
RDW: 14.5 % (ref 11.5–15.5)
Saturation Ratios: 23 % (ref 20–55)
TIBC: 316 ug/dL (ref 215–435)
UIBC: 244 ug/dL
WBC: 3.6 10*3/uL — ABNORMAL LOW (ref 4.0–10.5)

## 2008-07-07 ENCOUNTER — Encounter: Payer: Self-pay | Admitting: Family Medicine

## 2008-08-15 ENCOUNTER — Encounter: Payer: Self-pay | Admitting: Family Medicine

## 2008-11-22 ENCOUNTER — Ambulatory Visit: Payer: Self-pay | Admitting: Family Medicine

## 2008-11-22 LAB — CONVERTED CEMR LAB: Hgb A1c MFr Bld: 5.3 %

## 2008-12-22 ENCOUNTER — Ambulatory Visit: Payer: Self-pay | Admitting: Family Medicine

## 2008-12-22 ENCOUNTER — Encounter: Payer: Self-pay | Admitting: Family Medicine

## 2008-12-22 LAB — CONVERTED CEMR LAB
ALT: 16 units/L (ref 0–53)
AST: 19 units/L (ref 0–37)
Albumin: 4.4 g/dL (ref 3.5–5.2)
Alkaline Phosphatase: 42 units/L (ref 39–117)
BUN: 20 mg/dL (ref 6–23)
CO2: 25 meq/L (ref 19–32)
Calcium: 8.8 mg/dL (ref 8.4–10.5)
Chloride: 104 meq/L (ref 96–112)
Cholesterol: 136 mg/dL (ref 0–200)
Creatinine, Ser: 1.36 mg/dL (ref 0.40–1.50)
Glucose, Bld: 114 mg/dL — ABNORMAL HIGH (ref 70–99)
HCT: 32.9 % — ABNORMAL LOW (ref 39.0–52.0)
HDL: 32 mg/dL — ABNORMAL LOW (ref >39)
Hemoglobin: 10.7 g/dL — ABNORMAL LOW (ref 13.0–17.0)
LDL Cholesterol: 37 mg/dL (ref 0–99)
MCHC: 32.5 g/dL (ref 30.0–36.0)
MCV: 98.8 fL (ref 78.0–100.0)
Platelets: 157 10*3/uL (ref 150–400)
Potassium: 3.9 meq/L (ref 3.5–5.3)
RBC: 3.33 M/uL — ABNORMAL LOW (ref 4.22–5.81)
RDW: 14.1 % (ref 11.5–15.5)
Sodium: 142 meq/L (ref 135–145)
Total Bilirubin: 0.5 mg/dL (ref 0.3–1.2)
Total CHOL/HDL Ratio: 4.3
Total Protein: 6.8 g/dL (ref 6.0–8.3)
Triglycerides: 335 mg/dL — ABNORMAL HIGH (ref <150)
VLDL: 67 mg/dL — ABNORMAL HIGH (ref 0–40)
WBC: 4 10*3/uL (ref 4.0–10.5)

## 2008-12-26 ENCOUNTER — Encounter: Payer: Self-pay | Admitting: Family Medicine

## 2009-01-20 ENCOUNTER — Encounter: Payer: Self-pay | Admitting: Family Medicine

## 2009-01-31 ENCOUNTER — Ambulatory Visit: Payer: Self-pay | Admitting: Family Medicine

## 2009-03-07 ENCOUNTER — Ambulatory Visit: Payer: Self-pay | Admitting: Family Medicine

## 2009-03-07 LAB — CONVERTED CEMR LAB: Hgb A1c MFr Bld: 5.8 %

## 2009-04-04 ENCOUNTER — Ambulatory Visit: Payer: Self-pay | Admitting: Family Medicine

## 2009-04-05 ENCOUNTER — Encounter: Payer: Self-pay | Admitting: Family Medicine

## 2009-05-30 ENCOUNTER — Ambulatory Visit: Payer: Self-pay | Admitting: Family Medicine

## 2009-05-30 LAB — CONVERTED CEMR LAB
BUN: 21 mg/dL (ref 6–23)
CO2: 23 meq/L (ref 19–32)
Calcium: 8.9 mg/dL (ref 8.4–10.5)
Chloride: 99 meq/L (ref 96–112)
Creatinine, Ser: 1.3 mg/dL (ref 0.40–1.50)
Glucose, Bld: 154 mg/dL — ABNORMAL HIGH (ref 70–99)
HCT: 31.8 % — ABNORMAL LOW (ref 39.0–52.0)
Hemoglobin: 11.1 g/dL — ABNORMAL LOW (ref 13.0–17.0)
MCHC: 34.9 g/dL (ref 30.0–36.0)
MCV: 90.1 fL (ref 78.0–100.0)
PSA: 2.19 ng/mL (ref 0.10–4.00)
Platelets: 139 10*3/uL — ABNORMAL LOW (ref 150–400)
Potassium: 3.8 meq/L (ref 3.5–5.3)
RBC: 3.53 M/uL — ABNORMAL LOW (ref 4.22–5.81)
RDW: 14.1 % (ref 11.5–15.5)
Sodium: 137 meq/L (ref 135–145)
WBC: 4.7 10*3/uL (ref 4.0–10.5)

## 2009-06-02 ENCOUNTER — Encounter: Payer: Self-pay | Admitting: Family Medicine

## 2009-08-08 ENCOUNTER — Ambulatory Visit: Payer: Self-pay | Admitting: Family Medicine

## 2009-08-08 LAB — CONVERTED CEMR LAB: Hgb A1c MFr Bld: 6.2 %

## 2009-08-22 ENCOUNTER — Encounter: Payer: Self-pay | Admitting: Family Medicine

## 2009-11-23 ENCOUNTER — Ambulatory Visit: Payer: Self-pay | Admitting: Family Medicine

## 2009-11-24 ENCOUNTER — Ambulatory Visit: Payer: Self-pay | Admitting: Family Medicine

## 2009-12-13 ENCOUNTER — Telehealth: Payer: Self-pay | Admitting: Family Medicine

## 2010-02-27 ENCOUNTER — Encounter (INDEPENDENT_AMBULATORY_CARE_PROVIDER_SITE_OTHER): Payer: Self-pay | Admitting: Pharmacist

## 2010-03-06 ENCOUNTER — Ambulatory Visit: Payer: Self-pay | Admitting: Family Medicine

## 2010-03-06 LAB — CONVERTED CEMR LAB: Hgb A1c MFr Bld: 5.9 %

## 2010-03-08 ENCOUNTER — Encounter: Payer: Self-pay | Admitting: *Deleted

## 2010-03-13 ENCOUNTER — Ambulatory Visit
Admission: RE | Admit: 2010-03-13 | Discharge: 2010-03-13 | Payer: Self-pay | Source: Home / Self Care | Admitting: Family Medicine

## 2010-03-15 ENCOUNTER — Encounter: Payer: Self-pay | Admitting: Family Medicine

## 2010-03-16 ENCOUNTER — Telehealth: Payer: Self-pay | Admitting: Family Medicine

## 2010-03-28 ENCOUNTER — Telehealth: Payer: Self-pay | Admitting: Family Medicine

## 2010-04-03 ENCOUNTER — Ambulatory Visit: Payer: Self-pay | Admitting: Family Medicine

## 2010-04-03 ENCOUNTER — Encounter: Payer: Self-pay | Admitting: Family Medicine

## 2010-04-04 ENCOUNTER — Encounter: Payer: Self-pay | Admitting: Family Medicine

## 2010-04-25 LAB — CONVERTED CEMR LAB
ALT: 28 units/L (ref 0–53)
AST: 24 units/L (ref 0–37)
Albumin: 4.4 g/dL (ref 3.5–5.2)
Alkaline Phosphatase: 45 units/L (ref 39–117)
BUN: 21 mg/dL (ref 6–23)
CO2: 26 meq/L (ref 19–32)
Calcium: 8.9 mg/dL (ref 8.4–10.5)
Chloride: 105 meq/L (ref 96–112)
Cholesterol: 111 mg/dL (ref 0–200)
Creatinine, Ser: 1.55 mg/dL — ABNORMAL HIGH (ref 0.40–1.50)
Glucose, Bld: 176 mg/dL — ABNORMAL HIGH (ref 70–99)
HCT: 32.8 % — ABNORMAL LOW (ref 39.0–52.0)
HDL: 30 mg/dL — ABNORMAL LOW (ref >39)
Hemoglobin: 10.6 g/dL — ABNORMAL LOW (ref 13.0–17.0)
LDL Cholesterol: 36 mg/dL (ref 0–99)
MCHC: 32.3 g/dL (ref 30.0–36.0)
MCV: 96.5 fL (ref 78.0–100.0)
Platelets: 125 10*3/uL — ABNORMAL LOW (ref 150–400)
Potassium: 3.9 meq/L (ref 3.5–5.3)
RBC: 3.4 M/uL — ABNORMAL LOW (ref 4.22–5.81)
RDW: 14.8 % (ref 11.5–15.5)
Sodium: 141 meq/L (ref 135–145)
Total Bilirubin: 0.5 mg/dL (ref 0.3–1.2)
Total CHOL/HDL Ratio: 3.7
Total Protein: 6.5 g/dL (ref 6.0–8.3)
Triglycerides: 224 mg/dL — ABNORMAL HIGH (ref <150)
VLDL: 45 mg/dL — ABNORMAL HIGH (ref 0–40)
Vitamin B-12: 306 pg/mL (ref 211–911)
WBC: 3.7 10*3/uL — ABNORMAL LOW (ref 4.0–10.5)

## 2010-05-20 ENCOUNTER — Encounter: Payer: Self-pay | Admitting: Family Medicine

## 2010-05-29 NOTE — Consult Note (Signed)
Summary: Cancer Center Note/ConsiderGI,AAA studies  [facility] Progress Note   Imported By: Haydee Salter 05/20/2007 16:19:46  _____________________________________________________________________  External Attachment:    Type:   Image     Comment:   External Document

## 2010-05-29 NOTE — Letter (Signed)
Summary: Results Follow-up Letter  [facility]  [address]   [place], [place] [address]   Phone: [phone]  Fax: [phone]    06/02/2009  [address] [place], [place]  [address]  Dear [name],   The following are the results of your recent test(s): Patient: [name] Your hemoglobin has improved (less anemic) and your kidney function remains good.  Tests: (1) CBC NO Diff (Complete Blood Count) (10000)   WBC                       4.7 K/uL                    4.0-10.5   RBC                  [L]  3.53 MIL/uL                 4.22-5.81   Hemoglobin           [L]  11.1 g/dL                   83.1-51.7   Hematocrit           [L]  31.8 %                      39.0-52.0   MCV                       90.1 fL                     78.0-100.0   MCHC                      34.9 g/dL                   61.6-07.3   RDW                       14.1 %                      11.5-15.5   Platelet Count       [L]  139 K/uL                    150-400  Tests: (2) Basic Metabolic Panel (71062)   Sodium                    137 mEq/L                   135-145   Potassium                 3.8 mEq/L                   3.5-5.3   Chloride                  99 mEq/L                    96-112   CO2                       23 mEq/L                    19-32   Glucose              [  H]  154 mg/dL                   16-10   BUN                       21 mg/dL                    9-60   Creatinine                1.30 mg/dL                  0.40-1.50   Calcium                   8.9 mg/dL                   4.5-40.9 Your prostate screening test is lower, which is good. Tests: (3) PSA (81191)   PSA                       2.19 ng/mL                  0.10-4.00     Test Methodology: [employer] PSA  Document Creation Date: 05/30/2009 11:58 PM _______________________________________________________________________  Sincerely,  Zachery Dauer MD [facility]            Appended  Document: Results Follow-up Letter mailed.

## 2010-05-29 NOTE — Consult Note (Signed)
Summary: Heme Plan - trial of IV iron  [facility] Progress Note   Imported By: Haydee Salter 03/25/2007 14:37:19  _____________________________________________________________________  External Attachment:    Type:   Image     Comment:   External Document

## 2010-05-29 NOTE — Assessment & Plan Note (Signed)
Summary: DM, R elbow discomfort/ bmc   Vital Signs:  Patient Profile:   70 Years Old Male Height:     66.5 inches Weight:      155.3 pounds BMI:     24.78 Pulse rate:   64 / minute BP sitting:   110 / 62  (left arm)  Pt. in pain?   no  Vitals Entered By: Arlyss Repress CMA, (June 07, 2008 3:31 PM)              Is Patient Diabetic? Yes    Last Flu Vaccine:  given (12/28/2005 2:19:24 PM) Flu Vaccine Result Date:  01/27/2008 Flu Vaccine Result:  given Flu Vaccine Next Due:  1 yr Last HGBA1C:  5.6 (12/08/2007 9:34:54 AM) HGBA1C Result Date:  06/07/2008 HGBA1C Next Due:  6 mo Microalbumin Result Date:  06/07/2008 Microalbumin Result:  normal Microalbumin Next Due:  1 yr Foot Care Education Date:  06/07/2008 Foot Care Education:  completed Foot Care Education Due:  1 yr Self-Mgt EDU Date:  06/07/2008 Self-Mgt EDU:  completed Self-Mgt EDU Next Due:  1 yr   PCP:  Zachery Dauer  Chief Complaint:  regular visit. f/up DM.  History of Present Illness: For a couple days last week he felt hot and cold and diaphoretic. No appetite and lost 4-5 pounds.   Feet have felt strange for several years. Not oversensitive, feels better barefoot.  [facility] [employer] [facility]. Hopes to do more when her retires in June.   He dislocated his right elbow at age 5 and 59. He devleopes numbness in the entire right hand when he lies in bed wtih the right elbow flexed. Has been told there is scar tissue that would have to be drained to get full extension of the elbow and he wonders if it is impeding his circulation    Current Allergies: ! * MAALOX ! ACE INHIBITORS  Past Medical History:    right elbow dislocation 1961 and 1978    ETT 04/08/94 -, ETT neg poor HR and BP recovery - 07/07/2002    Hep B vaccinated 2005    Abdominal U.S. no aneurysm, fatty liver - 03/19/2004    EGD neg - 10/22/2004    PPD neg - 12/02/2002, TSH nl - 01/16/2001        Waist 35.5 in - 08/17/2001   Family History:   Alzheimers - M died age 83    Family History of Aneurysm Aortic - Father died at age 88,  Orlando Penner has had AAA repaired plus aorto iliac graft.     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  well-developed and well-nourished.   Lungs:     normal respiratory effort, normal breath sounds, and no crackles.   Heart:     normal rate, regular rhythm, and no murmur.   Abdomen:     diastasis recti and abdominal obesity  linear thickening right inguinal surgery area, slightly bulging with Valsalva. unchanged Msk:     partial loss of metatarsal arches. No callouses Pulses:  R & L posterior tibial normal and R dorsalis pedis normal.  R posterior tibial normal and R dorsalis pedis normal.  Hair on toes Extremities:     right elbow lacks 15 degrees of extension. thickened tissue anterior and posterior. Neurologic:     Normal strength in the right hand, normal sensation  Diabetes Management Exam:    Foot Exam (with socks and/or shoes not present):       Sensory-Pinprick/Light touch:          Left medial foot (L-4): normal          Left dorsal foot (L-5): normal          Left lateral foot (S-1): normal          Right medial foot (L-4): normal          Right dorsal foot (L-5): normal          Right lateral foot (S-1): normal       Sensory-Monofilament:          Left foot: normal          Right foot: normal       Inspection:          Left foot: normal          Right foot: normal       Nails:          Left foot: normal          Right foot: normal    Impression & Recommendations:  Problem # 1:  DIABETES MELLITUS, TYPE II, CONTROLLED, W/RENAL COMPS (ICD-250.40) Well  controlled His updated medication list for this problem includes:    Avandamet 07-998 Mg Tabs (Rosiglitazone-metformin) .Marland Kitchen... Take one tablet twice daily    Bayer Childrens Aspirin 81 Mg Chew (Aspirin) .Marland Kitchen... Take 1 tablet by mouth once a day    Glipizide 2.5 Mg Tb24 (Glipizide) .Marland Kitchen... Take one tablet daily  Orders: A1C-[facility] ([address]) UA Microalbumin-[facility] (60454) Basic Met-[facility] ([address]-91478) [facility]- Est  Level 4 (29562)   Problem # 2:  ESSENTIAL HYPERTENSION, BENIGN (ICD-401.1) Controlled His updated medication list for this problem includes:    Doxazosin Mesylate 4 Mg Tabs (Doxazosin mesylate) .Marland Kitchen... Take one tablet daily at bedtime    Hydrochlorothiazide 25 Mg Tabs (Hydrochlorothiazide) .Marland Kitchen... Take 1/2  tab daily    Metoprolol Tartrate 50 Mg Tabs (Metoprolol tartrate) .Marland Kitchen... Take 1 tablet by mouth twice a day  Orders: [facility]- Est  Level 4 (13086)   Problem # 3:  ANGIOEDEMA (ICD-995.1) Had swelling of the tongue for half a day recently. Onion was apparent precipitant. ACE contraindicated  Problem # 4:  NEUROPATHY, PERIPHERAL (ICD-356.9) Probable non-diabetic component, but not at risk for diabetic foot ulcers at this point Orders: [facility]- Est  Level 4 (99214)   Problem # 5:  FAMILY HISTORY OF ANEURYSM AORTIC (ICD-V17.4) Five years since his last screen so will order another ultrasound.  Orders: Ultrasound (Ultrasound)   Problem # 6:  ANEMIA, OTHER, UNSPECIFIED (ICD-285.9) Will follow-up  His updated medication list for this problem includes:    Vitamin B-12 Cr 1000 Mcg Tbcr (Cyanocobalamin) .Marland Kitchen... Take one tablet by mouth daily  Orders: CBC-[facility] (57846) [facility]- Est  Level 4 (96295)   Problem # 7:  INGUINAL HERNIA (ICD-550.90) Scar tissue in area of mesh placement.   Problem # 8:  BPH (ICD-600) No progression of symptoms   Problem # 9:  STIFFNESS OF JOINT NEC UPPER ARM (ICD-719.52)  Problem # 10:  ULNAR NERVE  ENTRAPMENT, RIGHT (ICD-354.2) Suspect this is the cause of numbness of R  hand, symptomatic when the right elbow is flexed.   Complete Medication List: 1)  Avandamet 07-998 Mg Tabs (Rosiglitazone-metformin) .... Take one tablet twice daily 2)  Bayer Childrens Aspirin 81 Mg Chew (Aspirin) .... Take 1 tablet by mouth once a day 3)  Doxazosin Mesylate 4 Mg Tabs (Doxazosin mesylate) .... Take one tablet daily at bedtime 4)  Epipen 2-pak 0.3 Mg/0.47ml (1:1000) Devi (Epinephrine hcl (anaphylaxis)) .... Inject 1 pen intramuscularly as directed 5)  Glipizide 2.5 Mg Tb24 (Glipizide) .... Take one tablet daily 6)  Hydrochlorothiazide 25 Mg Tabs (Hydrochlorothiazide) .... Take 1/2  tab daily 7)  Metoprolol Tartrate 50 Mg Tabs (Metoprolol tartrate) .... Take 1 tablet by mouth twice a day 8)  Omeprazole 20 Mg Cpdr (Omeprazole) .... Take one tab each am 9)  Simvastatin 20 Mg Tabs (Simvastatin) .... Take one tablet daily at bedtime 10)  Fish Oil Concentrate 1000 Mg Caps (Omega-3 fatty acids) .... Take one cap two times a day 11)  Vitamin B-12 Cr 1000 Mcg Tbcr (Cyanocobalamin) .... Take one tablet by mouth daily 12)  Freestyle Test Strp (Glucose blood) .... Check fasting blood sugars  Other Orders: PSA-[facility] ([id number])   Patient Instructions: 1)  Please schedule a follow-up appointment in 6 months. 2)  It is important that you exercise regularly at least 20 minutes 5 times a week. If you develop chest pain, have severe difficulty breathing, or feel very tired , stop exercising immediately and seek medical attention.   Prescriptions: GLIPIZIDE 2.5 MG TB24 (GLIPIZIDE) take one tablet daily  #30 Tablet x 11   Entered and Authorized by:   Zachery Dauer MD   Signed by:   Zachery Dauer MD on 06/07/2008   Method used:   Electronically to        [facility] (retail)       [address]       [place], [place]  [address]       Ph: [phone] or [phone]       Fax: [phone]   RxID:   [id number] FREESTYLE TEST  STRP (GLUCOSE BLOOD) Check fasting blood sugars  #50 x 11    Entered and Authorized by:   Zachery Dauer MD   Signed by:   Zachery Dauer MD on 06/07/2008   Method used:   Electronically to        [facility] (retail)       [address]       [place], [place]  [address]       Ph: [phone] or [phone]       Fax: [phone]   RxID:   [id number]    Laboratory Results   Urine Tests  Date/Time Received: June 07, 2008 4:37 PM  Date/Time Reported: June 07, 2008 4:54 PM   Microalbumin (urine): normal mg/L   Comments: ..............test performed by Maree Erie (SMA)...... ...........verified by Dewitt Hoes, [place](ASCP).........   Blood Tests   Date/Time Received: June 07, 2008 3:37 PM  Date/Time Reported: June 07, 2008 3:57 PM   HGBA1C: 5.7%   (Normal Range: Non-Diabetic - 3-6%   Control Diabetic - 6-8%)  Comments: ..........test performed by.....Marland KitchenMarland KitchenDeseree Blount (SMA)                 confirmed by...Marland KitchenMarland KitchenMarland Kitchen Bonnie A. [place], [place] (ASCP)

## 2010-05-29 NOTE — Consult Note (Signed)
Summary: [place] [facility] No retinopathy  [facility]   Imported By: Knox Royalty 03/15/2010 16:37:53  _____________________________________________________________________  External Attachment:    Type:   Image     Comment:   External Document  Appended Document:  Opth No retinopathy    Clinical Lists Changes  Observations: Added new observation of DIAB EYE EX: No diabetic retinopathy.   Cataract.    (03/13/2010 21:53)       Diabetic Eye Exam  Procedure date:  03/13/2010  Findings:      No diabetic retinopathy.   Cataract.     Comments:      Dr Eulah Pont   Diabetic Eye Exam  Procedure date:  03/13/2010  Findings:      No diabetic retinopathy.   Cataract.     Comments:      Dr Eulah Pont

## 2010-05-29 NOTE — Miscellaneous (Signed)
  Clinical Lists Changes  Orders: Added new Test order of Creatinine-[facility] [id number]) - Signed Added new Test order of Miscellaneous Lab Charge-[facility] [phone]) - Signed

## 2010-05-29 NOTE — Assessment & Plan Note (Signed)
Summary: DM, anemia/ up wp   Vital Signs:  Patient Profile:   70 Years Old Male Height:     66.5 inches Weight:      156 pounds Pulse rate:   65 / minute BP sitting:   145 / 79  (right arm)  Pt. in pain?   no  Vitals Entered By: Tanner Mason CMA, (February 24, 2007 8:28 AM)              Is Patient Diabetic? Yes      PCP:  Tanner Mason  Chief Complaint:  f/up per Tanner.Arzu Mason; f/up labs.  History of Present Illness: After series of Vitamin B12 by Tanner continues to feel tired described as fatigability. Better with rest. Sleeping well, nocturia x 2. Hasn't gotten a report from his bone marrow test. Wonders if Avandia may be causing the anemia.   Feet uncomfortable when first walks,  gradually improves. No nighttime symptoms.  Toes still numb bilaterally  Mild discomfort in R inguinal area of herniorrhaphy at times.  The dysesthesias R ant lat thigh has improved, but still mildly numb there. Not aware of a compression injury above there.   Diabetes Management History:      The patient is a 70 years old male who comes in for evaluation of DM Type 2.  He has not been enrolled in the "Diabetic Education Program".  He is checking home blood sugars.  He says that he is exercising.  Type of exercise includes: walking.  Duration of exercise is estimated to be 45 min.  He is doing this 3 times per week.        Hypoglycemic symptoms are not occurring.  No hyperglycemic symptoms are reported.  Other comments include: Fasting CBGs 98-110.    Dyspepsia History:      There is a prior history of GERD.    Lipid Management History:      Positive NCEP/ATP III risk factors include male age 51 years old or older, diabetes, and HDL cholesterol less than 40.  Negative NCEP/ATP III risk factors include non-tobacco-user status.       Current Allergies: ! * MAALOX ! ACE INHIBITORS     Alzheimers - M died age 28    Family History of Aneurysm Aortic - Father died,  Bro has    Family History Diabetes  1st degree relative -  Bro     Physical Exam  General:     alert and well-nourished.   Lungs:     normal respiratory effort, normal breath sounds, and no crackles.   Heart:     normal rate, regular rhythm, and no murmur.   Abdomen:     Bowel sounds positive,abdomen soft and non-tender without masses, organomegaly or hernias noted.    Impression & Recommendations:  Problem # 1:  DIABETES MELLITUS II, UNCOMPLICATED (ICD-250.00)  His updated medication list for this problem includes:    Avandamet 07-998 Mg Tabs (Rosiglitazone-metformin)    Bayer Childrens Aspirin 81 Mg Chew (Aspirin) .Marland Kitchen... Take 1 tablet by mouth once a day    Glipizide 2.5 Mg Tb24 (Glipizide)  Orders: [facility]- Est  Level 4 (69629)   Problem # 2:  GASTROESOPHAGEAL REFLUX, NO ESOPHAGITIS (ICD-530.81) Assessment: Improved  His updated medication list for this problem includes:    Omeprazole 20 Mg Cpdr (Omeprazole)  Orders: [facility]- Est  Level 4 (52841)   Problem # 3:  ANEMIA, OTHER, UNSPECIFIED (ICD-285.9) Will discuss with his hematologist the possible benefit from switching from  Avandia. The bone marrow biopsy apparently was normal. Orders: [facility]- Est  Level 4 (99214)   Problem # 4:  INGUINAL HERNIA (ICD-550.90) No sign of recurrence  Problem # 5:  MERALGIA PARESTHETICA (ICD-355.1) Improving. May relate to his inguinal hernia surgery.  Complete Medication List: 1)  Avandamet 07-998 Mg Tabs (Rosiglitazone-metformin) 2)  Bayer Childrens Aspirin 81 Mg Chew (Aspirin) .... Take 1 tablet by mouth once a day 3)  Doxazosin Mesylate 4 Mg Tabs (Doxazosin mesylate) 4)  Epipen 2-pak 0.3 Mg/0.58ml (1:1000) Devi (Epinephrine hcl (anaphylaxis)) .... Inject 1 pen intramuscularly as directed 5)  Glipizide 2.5 Mg Tb24 (Glipizide) 6)  Hydrochlorothiazide 25 Mg Tabs (Hydrochlorothiazide) 7)  Lovaza 1 Gm Caps (Omega-3-acid ethyl esters) .... Take 2 capsule by mouth twice a day 8)  Metoprolol Tartrate 50 Mg Tabs (Metoprolol  tartrate) .... Take 1 tablet by mouth twice a day 9)  Omeprazole 20 Mg Cpdr (Omeprazole) 10)  Simvastatin 20 Mg Tabs (Simvastatin) 11)  Zyrtec Allergy 10 Mg Tabs (Cetirizine hcl) .... Take 1 tablet by mouth once a day 12)  Therapeutic Multivitamin Tabs (Multiple vitamin) .... Take 1 tab daily  Diabetes Management Assessment/Plan:      The following lipid goals have been established for the patient: Total cholesterol goal of 200; LDL cholesterol goal of 100; HDL cholesterol goal of 40; Triglyceride goal of 150.    Lipid Assessment/Plan:      Based on NCEP/ATP III, the patient's risk factor category is "history of diabetes".  From this information, the patient's calculated lipid goals are as follows: Total cholesterol goal is 200; LDL cholesterol goal is 100; HDL cholesterol goal is 40; Triglyceride goal is 150.     Patient Instructions: 1)  Please schedule a follow-up appointment in 3 months. 2)  It is important that you exercise regularly at least 20 minutes 5 times a week. If you develop chest pain, have severe difficulty breathing, or feel very tired , stop exercising immediately and seek medical attention. 3)  Restart fish oil capsules. Call if would like a Rx for it.  4)  Get flu shot at work 5)  I'll contact you about possibly changing meds to see effects on blood count and fatigue.    ]  Appended Document: DM, anemia/ up wp I spoke with his hematologist, Tanner Mason, who recalls the Tanner Mason biopsy showing scanty iron stores. I told him of Tanner Mason colonoscopy last year and history of esophagitis. Tanner Mason will contact me if he thinks it worthwhile to try him off Avandia or another of his meds.

## 2010-05-29 NOTE — Miscellaneous (Signed)
Summary: Omeprazole authorization signed  Moderate GERD with daily and disabling symptoms  Clinical Lists Changes  Problems: Changed problem from GASTROESOPHAGEAL REFLUX, NO ESOPHAGITIS (ICD-530.81) to GASTROESOPHAGEAL REFLUX DISEASE, CHRONIC (ICD-530.81)

## 2010-05-29 NOTE — Miscellaneous (Signed)
Summary: prevention update  Clinical Lists Changes  Observations: Added new observation of COLONNXTDUE: 08/12/2012 (01/11/2008 12:14)     Last Colonoscopy:  Rectal polyp excised (08/13/2007 10:53:05 AM) Colonoscopy Next Due:  5 yr

## 2010-05-29 NOTE — Consult Note (Signed)
Summary: [facility]  [facility]   Imported By: [facility] KIVETT 12/10/2007 14:21:05  _____________________________________________________________________  External Attachment:    Type:   Image     Comment:   External Document

## 2010-05-29 NOTE — Miscellaneous (Signed)
Summary: re: appt for LE Dopplers/ts  called pt and lmvm to call back. PT HAS APPT AT [facility] FOR LE ARTERIAL DOPPLER 03-13-10 AT 10 AM Arlyss Repress CMA,  March 08, 2010 4:46 PM called pt again and lmvm with above information .Arlyss Repress CMA,  March 09, 2010 2:41 PM

## 2010-05-29 NOTE — Assessment & Plan Note (Signed)
Summary: f/u,df   Vital Signs:  Patient profile:   70 year old male Height:      66.5 inches Weight:      151 pounds BMI:     24.09 Temp:     97.9 degrees F oral Pulse rate:   61 / minute BP sitting:   154 / 84  (right arm) Cuff size:   regular  Vitals Entered By: Jimmy Footman, CMA (November 23, 2009 8:35 AM)  Serial Vital Signs/Assessments:  Time      Position  BP       Pulse  Resp  Temp     By                     135/75                         Angelena Sole MD  CC: f/u Is Patient Diabetic? Yes   Primary Care Provider:  Zachery Dauer  CC:  f/u.  History of Present Illness: 1. DM: - taking and tolerating medicines as prescribed - CBGs at home averaging 120-130 - Has annual eye exams ROS: denies vision problems, numbness / weakness  2. HTN - taking and tolerating medicines as prescribed - doesn't check his blood pressure at home ROS: denies chest pain, shortness of breath  3. HLD - Taking and tolerating medicine as prescribed - Last LDL was 37  4. Acid Reflux - Taking Omeprazole as prescribed - No recent problems or burning type symptoms  Habits & Providers  Alcohol-Tobacco-Diet     Tobacco Status: never  Current Medications (verified): 1)  Bayer Childrens Aspirin 81 Mg Chew (Aspirin) .... Take 1 Tablet By Mouth Once A Day 2)  Doxazosin Mesylate 4 Mg Tabs (Doxazosin Mesylate) .... Take One Tablet Daily At Bedtime 3)  Epipen 2-Pak 0.3 Mg/0.72ml (1:1000) Devi (Epinephrine Hcl (Anaphylaxis)) .... Inject 1 Pen Intramuscularly As Directed 4)  Glipizide 5 Mg Tabs (Glipizide) .... Take One Half  Tab Daily 5)  Hydrochlorothiazide 25 Mg Tabs (Hydrochlorothiazide) .... Take 1  Tab Daily 6)  Metoprolol Tartrate 100 Mg Tabs (Metoprolol Tartrate) .... Take One Tablet Twice Daily 7)  Omeprazole 20 Mg Cpdr (Omeprazole) .... Take One Tab Each Am 8)  Simvastatin 20 Mg Tabs (Simvastatin) .... Take One Tablet Daily At Bedtime 9)  Fish Oil Concentrate 1000 Mg Caps (Omega-3 Fatty  Acids) .... Take One Cap Two Times A Day 10)  Vitamin B-12 Cr 1000 Mcg  Tbcr (Cyanocobalamin) .... Take One Tablet By Mouth Daily 11)  Freestyle Test  Strp (Glucose Blood) .... Check Fasting Blood Sugars 12)  Metformin Hcl 1000 Mg Tabs (Metformin Hcl) .... Take One Tab Two Times A Day 13)  Zostavax 16109 Unt/0.68ml Solr (Zoster Vaccine Live) .... Injection  Allergies: 1)  ! * Maalox 2)  ! Ace Inhibitors  Past History:  Past Medical History: Reviewed history from 06/07/2008 and no changes required. right elbow dislocation 1961 and 1978 ETT 04/08/94 -, ETT neg poor HR and BP recovery - 07/07/2002 Hep B vaccinated 2005 Abdominal U.S. no aneurysm, fatty liver - 03/19/2004 EGD neg - 10/22/2004 PPD neg - 12/02/2002, TSH nl - 01/16/2001  Waist 35.5 in - 08/17/2001  Social History: Reviewed history from 08/08/2009 and no changes required. Married, Oncologist, retired Runner, broadcasting/film/video;  Daughter, Music therapist in [place], 2 children 11 and 9 Daughter, Brooke - [place], 2 children, 2 and 4, third on the way Non-smoker since college;  2 years Alcohol occ Occupation:teacher, retired in June, 2010 Regular exercise-yes  Physical Exam  General:  vitals reviewed and rechecked.  alert.  well-developed and well-nourished.   Eyes:  fundoscopic exam benign Mouth:  No apparent facial swelling Lungs:  normal respiratory effort, normal breath sounds, and no crackles.   Heart:  normal rate, regular rhythm, and no murmur.   Abdomen:  soft, non-tender, normal bowel sounds, and no distention.   Extremities:  No clubbing, cyanosis, edema Psych:  Cognition and judgment appear intact. Alert and cooperative with normal attention span and concentration. No apparent delusions, illusions, hallucinations   Impression & Recommendations:  Problem # 1:  DIABETES MELLITUS, TYPE II, CONTROLLED, W/RENAL COMPS (ICD-250.40) Assessment Unchanged Appears well controlled.   Check A1C today.  Continue current meds. His updated  medication list for this problem includes:    Bayer Childrens Aspirin 81 Mg Chew (Aspirin) .Marland Kitchen... Take 1 tablet by mouth once a day    Glipizide 5 Mg Tabs (Glipizide) .Marland Kitchen... Take one half  tab daily    Metformin Hcl 1000 Mg Tabs (Metformin hcl) .Marland Kitchen... Take one tab two times a day  Orders: A1C-[facility] (04540) [facility]- Est  Level 4 (98119)  Problem # 2:  ESSENTIAL HYPERTENSION, BENIGN (ICD-401.1) Assessment: Unchanged  At goal when rechecked.  Continue current medications. His updated medication list for this problem includes:    Doxazosin Mesylate 4 Mg Tabs (Doxazosin mesylate) .Marland Kitchen... Take one tablet daily at bedtime    Hydrochlorothiazide 25 Mg Tabs (Hydrochlorothiazide) .Marland Kitchen... Take 1  tab daily    Metoprolol Tartrate 100 Mg Tabs (Metoprolol tartrate) .Marland Kitchen... Take one tablet twice daily  Orders: [facility]- Est  Level 4 (14782)  Problem # 3:  HYPERLIPIDEMIA (ICD-272.4) Assessment: Unchanged  At goal.  Continue Simvastatin His updated medication list for this problem includes:    Simvastatin 20 Mg Tabs (Simvastatin) .Marland Kitchen... Take one tablet daily at bedtime  Orders: [facility]- Est  Level 4 (95621)  Problem # 4:  GASTROESOPHAGEAL REFLUX DISEASE, CHRONIC (ICD-530.81) Assessment: Unchanged  Well controlled His updated medication list for this problem includes:    Omeprazole 20 Mg Cpdr (Omeprazole) .Marland Kitchen... Take one tab each am  Orders: [facility]- Est  Level 4 (30865)  Complete Medication List: 1)  Bayer Childrens Aspirin 81 Mg Chew (Aspirin) .... Take 1 tablet by mouth once a day 2)  Doxazosin Mesylate 4 Mg Tabs (Doxazosin mesylate) .... Take one tablet daily at bedtime 3)  Epipen 2-pak 0.3 Mg/0.54ml (1:1000) Devi (Epinephrine hcl (anaphylaxis)) .... Inject 1 pen intramuscularly as directed 4)  Glipizide 5 Mg Tabs (Glipizide) .... Take one half  tab daily 5)  Hydrochlorothiazide 25 Mg Tabs (Hydrochlorothiazide) .... Take 1  tab daily 6)  Metoprolol Tartrate 100 Mg Tabs (Metoprolol tartrate) .... Take one tablet  twice daily 7)  Omeprazole 20 Mg Cpdr (Omeprazole) .... Take one tab each am 8)  Simvastatin 20 Mg Tabs (Simvastatin) .... Take one tablet daily at bedtime 9)  Fish Oil Concentrate 1000 Mg Caps (Omega-3 fatty acids) .... Take one cap two times a day 10)  Vitamin B-12 Cr 1000 Mcg Tbcr (Cyanocobalamin) .... Take one tablet by mouth daily 11)  Freestyle Test Strp (Glucose blood) .... Check fasting blood sugars 12)  Metformin Hcl 1000 Mg Tabs (Metformin hcl) .... Take one tab two times a day 13)  Zostavax 78469 Unt/0.30ml Solr (Zoster vaccine live) .... Injection  Patient Instructions: 1)  It was a pleasure to meet you. 2)  You are doing great 3)  We will not make any changes today 4)  Here is a prescription for the Shingles vaccine, you can take it to any pharmacy and see how expensive it will be 5)  Please schedule a follow up appointment in 3 months Prescriptions: ZOSTAVAX 16109 UNT/0.65ML SOLR (ZOSTER VACCINE LIVE) Injection  #1 x 0   Entered and Authorized by:   Angelena Sole MD   Signed by:   Angelena Sole MD on 11/23/2009   Method used:   Print then Give to Patient   RxID:   [id number]   Prevention & Chronic Care Immunizations   Influenza vaccine: Fluvax MCR  (01/31/2009)   Influenza vaccine due: 01/26/2009    Tetanus booster: 03/29/2004: given   Tetanus booster due: 03/29/2014    Pneumococcal vaccine: Pneumovax (Medicare)  (04/04/2009)   Pneumococcal vaccine deferral: Not indicated  (11/22/2008)   Pneumococcal vaccine due: None    H. zoster vaccine: Not documented  Colorectal Screening   Hemoccult: Done.  (06/27/2004)   Hemoccult due: Not Indicated    Colonoscopy: Rectal polyp excised  (08/13/2007)   Colonoscopy due: 08/12/2012  Other Screening   PSA: 2.19  (05/30/2009)   PSA action/deferral: Discussed-PSA requested  (05/30/2009)   PSA due due: 03/2008   Smoking status: never  (11/23/2009)  Diabetes Mellitus   HgbA1C: 6.2  (08/08/2009)   Hemoglobin  A1C due: 06/09/2008    Eye exam: normal  (04/04/2009)   Eye exam due: 01/19/2009    Foot exam: yes  (08/08/2009)   High risk foot: Not documented   Foot care education: completed  (06/07/2008)   Foot exam due: 12/07/2008    Urine microalbumin/creatinine ratio: Not documented   Urine microalbumin action/deferral: Not indicated   Urine microalbumin/cr due: 06/07/2009    Diabetes flowsheet reviewed?: Yes   Progress toward A1C goal: Unchanged  Lipids   Total Cholesterol: 136  (12/22/2008)   LDL: 37  (12/22/2008)   LDL Direct: Not documented   HDL: 32  (12/22/2008)   Triglycerides: 335  (12/22/2008)    SGOT (AST): 19  (12/22/2008)   SGPT (ALT): 16  (12/22/2008)   Alkaline phosphatase: 42  (12/22/2008)   Total bilirubin: 0.5  (12/22/2008)    Lipid flowsheet reviewed?: Yes   Progress toward LDL goal: Unchanged  Hypertension   Last Blood Pressure: 154 / 84  (11/23/2009)   Serum creatinine: 1.30  (05/30/2009)   Serum potassium 3.8  (05/30/2009)    Hypertension flowsheet reviewed?: Yes   Progress toward BP goal: Unchanged  Self-Management Support :   Personal Goals (by the next clinic visit) :     Personal A1C goal: 7  (01/20/2009)     Personal blood pressure goal: 130/80  (01/20/2009)     Personal LDL goal: 100  (01/20/2009)    Diabetes self-management support: CBG self-monitoring log  (03/07/2009)   Last diabetes self-management training by diabetes educator: completed    Hypertension self-management support: BP self-monitoring log  (03/07/2009)    Lipid self-management support: Not documented    Nursing Instructions: Give Herpes zoster vaccine today   Appended Document: A1c  5.6 %    Lab Visit  Laboratory Results   Blood Tests   Date/Time Received: November 23, 2009 9:19 AM  Date/Time Reported: November 23, 2009 9:34 AM   HGBA1C: 5.6%   (Normal Range: Non-Diabetic - 3-6%   Control Diabetic - 6-8%)  Comments: ...............test performed by......Marland KitchenBonnie A.  [place], MLS (ASCP)cm    Orders Today:

## 2010-05-29 NOTE — Assessment & Plan Note (Signed)
Summary: Question about AAA screening  I called Tanner Mason with the CT results. I recommended repeat aortic ultrasound perhaps in 2012  CT of Abdomen  Procedure date:  07/03/2007  Findings:      normal:  No aortic enlargement     CT of Abdomen  Procedure date:  07/03/2007  Findings:      normal:  No aortic enlargement

## 2010-05-29 NOTE — Procedures (Signed)
Summary: Colonoscopy and EGD normal  Hardcopy in MD box.  Colonoscopy  Procedure date:  08/13/2007  Findings:      Rectal polyp excised  EGD  Procedure date:  08/13/2007  Findings:      Normal esophagus, stomach, and duodum. Bx of duodenum for Celiac Disease showed only duodenitis  Colposcopy Biopsy Results  Procedure date:  08/13/2007  Findings:      non-adenomatous rectal polyp    Colonoscopy  Procedure date:  08/13/2007  Findings:      Rectal polyp excised  EGD  Procedure date:  08/13/2007  Findings:      Normal esophagus, stomach, and duodum. Bx of duodenum for Celiac Disease showed only duodenitis  Colposcopy Biopsy Results  Procedure date:  08/13/2007  Findings:      non-adenomatous rectal polyp

## 2010-05-29 NOTE — Miscellaneous (Signed)
Summary: Normal opthalmologic exam  Clinical Lists Changes  Observations: Added new observation of DB FT EX DUE: 12/07/2008 (01/31/2008 20:23) Added new observation of CREATNXTDUE: 12/07/2008 (01/31/2008 20:23) Added new observation of POTASSIUMDUE: 12/07/2008 (01/31/2008 20:23) Added new observation of HGBA1CNXTDUE: 06/09/2008 (01/31/2008 20:23) Added new observation of DMEYEEXAMNXT: 01/19/2009 (01/31/2008 20:23) Added new observation of DBT EY CK DT: 01/20/2008 (01/20/2008 20:25) Added new observation of DIAB EYE EX: normal (01/20/2008 20:25) Added new observation of EYE: FUNDI: No retinopathy (01/20/2008 20:23)     Last Diabetes Foot Check:  yes (12/08/2007 9:34:54 AM) Diabetes Foot Check Next Due:  1 yr Diabetic Eye Exam Date:  01/20/2008 Diabetes Eye Exam Result:  normal Diabetes Eye Exam Due:  1 yr Last HGBA1C:  5.6 (12/08/2007 9:34:54 AM) HGBA1C Next Due:  6 mo Last Creatinine:  1.52 (12/08/2007 6:25:00 PM) Creatinine Next Due: 1 yr Last Potassium:  3.8 (12/08/2007 6:25:00 PM) Potassium Next Due:  1 yr

## 2010-05-29 NOTE — Letter (Signed)
Summary: Results Follow-up Letter  [facility]  [address]   [place], [place] [address]   Phone: [phone]  Fax: [phone]    06/08/2008  [address] [place], [place]  [address]  Dear Mr. Gautier,   The following are the results of your recent test(s): Patient: Tanner Mason Note: All result statuses are Final unless otherwise noted.  Tests: (1) CBC NO Diff (Complete Blood Count) (10000)   WBC                       5.2 K/uL                    4.0-10.5   RBC                  [L]  3.29 MIL/uL                 4.22-5.81   Hemoglobin           [L]  10.3 g/dL                   02.7-25.3   Hematocrit           [L]  31.1 %                      39.0-52.0   MCV                       94.5 fL                     78.0-100.0   MCHC                      33.1 g/dL                   66.4-40.3   RDW                       13.9 %                      11.5-15.5   Platelet Count            204 K/uL                    150-400 Your hemoglobin has decreased from 11.4 when it was last checked. Please call to schedule a repeat in one month with iron studies at that time. YOur kidney function (creatinine) has improved.  Tests: (2) Basic Metabolic Panel (47425)   Sodium                    139 mEq/L                   135-145   Potassium                 3.9 mEq/L                   3.5-5.3   Chloride                  101 mEq/L                   96-112   CO2  24 mEq/L                    19-32   Glucose              [H]  106 mg/dL                   53-66   BUN                  [H]  25 mg/dL                    4-40   Creatinine                1.28 mg/dL                  0.40-1.50   Calcium                   9.4 mg/dL                   3.4-74.2  Tests: (3) PSA (59563)   PSA                       3.67 ng/mL                  0.10-4.00     Test Methodology: Hybritech PSA This test for prostate cancer is considered to identify those at risk when it is over 4.0. It  was 2.32 in December of 2007. The rise can just be from benign prostate enlargement or inflammation, but it should be repeated in 6 months to see how fast it is changing.  Document Creation Date: 06/08/2008 2:37 AM  Sincerely,  Zachery Dauer MD [facility]            Appended Document: Anemia followup labs    Clinical Lists Changes  Orders: Added new Test order of CBC-[facility] (87564) - Signed Added new Test order of Iron -[facility] [phone]) - Signed Added new Test order of Iron Binding Cap (TIBC)-[facility] ([address]) - Signed Added new Test order of Ferritin-[facility] [phone]) - Signed      Appended Document: Results Follow-up Letter mailed

## 2010-05-29 NOTE — Assessment & Plan Note (Signed)
Summary: fu DM, lightheadedwp   Vital Signs:  Patient Profile:   70 Years Old Male Height:     66.5 inches Weight:      151 pounds BMI:     24.09 Pulse rate:   64 / minute BP sitting:   112 / 72  (right arm) BP standing:   104 / 64  Pt. in pain?   no  Vitals Entered By: Arlyss Repress CMA, (December 08, 2007 9:37 AM)              Is Patient Diabetic? Yes      Serial Vital Signs/Assessments:  Time      Position  BP       Pulse  Resp  Temp     By           Lying RA  132/76                         Zachery Dauer MD           Standing  104/64                         Zachery Dauer MD  Comments: 3 mininute 112/62 By: Zachery Dauer MD    PCP:  Zachery Dauer  Chief Complaint:  f/up DM.  History of Present Illness: Going to [employer] [facility] 3 x weekly. eliptical and walking.   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   One episode of hypoglycemia symptoms capillary blood glucose was 65. capillary blood glucose fasting usually 125 -175  dizzy spells when stands up after bending over, lightheaded without vision change. No gait changes    Current Allergies: ! * MAALOX ! ACE INHIBITORS      Physical Exam  General:     alert.  well-developed and well-nourished.   Lungs:     normal respiratory effort, normal breath sounds, and no crackles.   Heart:     normal rate, regular rhythm, and no murmur.   Abdomen:     diastasis recti  linear thickening right inguinal surgery area, slightly bulging with Valsalva  Diabetes Management Exam:    Foot Exam (with socks and/or shoes not present):       Sensory-Monofilament:          Left foot: normal          Right foot: normal       Inspection:          Left foot: normal          Right foot: normal       Nails:          Left foot: normal          Right foot: normal    Impression & Recommendations:  Problem # 1:  DIABETES MELLITUS II, UNCOMPLICATED (ICD-250.00) Assessment: Unchanged  His updated medication list for  this problem includes:    Avandamet 07-998 Mg Tabs (Rosiglitazone-metformin) .Marland Kitchen... Take one tablet twice daily    Bayer Childrens Aspirin 81 Mg Chew (Aspirin) .Marland Kitchen... Take 1 tablet by mouth once a day    Glipizide 2.5 Mg Tb24 (Glipizide) .Marland Kitchen... Take one tablet daily  Orders: A1C-[facility] (16109) Comp Met-[facility] ([address]) [facility]- Est  Level 4 (91478)   Problem # 2:  DIASTASIS RECTI (ICD-728.84) benign, recommende crunches  Problem # 3:  INGUINAL HERNIA (ICD-550.90) No significant recurrence, had mesh repair  Problem #  4:  HYPERLIPIDEMIA (ICD-272.4)  His updated medication list for this problem includes:    Simvastatin 20 Mg Tabs (Simvastatin) .Marland Kitchen... Take one tablet daily at bedtime  Orders: Lipid-[facility] ([address]-60454) [facility]- Est  Level 4 (09811)   Problem # 5:  ANEMIA, OTHER, UNSPECIFIED (ICD-285.9) Due to CRF per hematologist His updated medication list for this problem includes:    Vitamin B-12 Cr 1000 Mcg Tbcr (Cyanocobalamin) .Marland Kitchen... Take one tablet by mouth daily   Problem # 6:  NEUROPATHY, PERIPHERAL (ICD-356.9)  Orders: Comp Met-[facility] ([address]1478-29562) [facility]- Est  Level 4 (13086)   Problem # 7:  ESSENTIAL HYPERTENSION, BENIGN (ICD-401.1) overcontrolled, decrease HCTZ His updated medication list for this problem includes:    Doxazosin Mesylate 4 Mg Tabs (Doxazosin mesylate) .Marland Kitchen... Take one tablet daily at bedtime    Hydrochlorothiazide 25 Mg Tabs (Hydrochlorothiazide) .Marland Kitchen... Take 1/2  tab daily    Metoprolol Tartrate 50 Mg Tabs (Metoprolol tartrate) .Marland Kitchen... Take 1 tablet by mouth twice a day   Complete Medication List: 1)  Avandamet 07-998 Mg Tabs (Rosiglitazone-metformin) .... Take one tablet twice daily 2)  Bayer Childrens Aspirin 81 Mg Chew (Aspirin) .... Take 1 tablet by mouth once a day 3)  Doxazosin Mesylate 4 Mg Tabs (Doxazosin mesylate) .... Take one tablet daily at bedtime 4)  Epipen 2-pak 0.3 Mg/0.36ml (1:1000) Devi (Epinephrine hcl (anaphylaxis)) .... Inject 1 pen intramuscularly  as directed 5)  Glipizide 2.5 Mg Tb24 (Glipizide) .... Take one tablet daily 6)  Hydrochlorothiazide 25 Mg Tabs (Hydrochlorothiazide) .... Take 1/2  tab daily 7)  Metoprolol Tartrate 50 Mg Tabs (Metoprolol tartrate) .... Take 1 tablet by mouth twice a day 8)  Omeprazole 20 Mg Cpdr (Omeprazole) .... Take one tab each am 9)  Simvastatin 20 Mg Tabs (Simvastatin) .... Take one tablet daily at bedtime 10)  Fish Oil 300 Mg Caps (Omega-3 fatty acids) .... Two  two times a day 11)  Vitamin B-12 Cr 1000 Mcg Tbcr (Cyanocobalamin) .... Take one tablet by mouth daily   Patient Instructions: 1)  Decrease HCTZ to 1/2 tab daily. Drink plenty of fluids 2)  I will call with lab results if they are abnormal  3)  Heart rate goal above 100 - 125. Add some weight lifting 4)  Please schedule a follow-up appointment in 6 months.   Prescriptions: HYDROCHLOROTHIAZIDE 25 MG TABS (HYDROCHLOROTHIAZIDE) Take 1/2  tab daily  #16 x 11   Entered and Authorized by:   Zachery Dauer MD   Signed by:   Zachery Dauer MD on 12/08/2007   Method used:   Electronically sent to ...       [facility] [address]  [id number]       [address]       [place], [place]  [address]       Ph: [phone] or [phone]       Fax: [phone]   RxID:   [id number] VITAMIN B-12 CR 1000 MCG  TBCR (CYANOCOBALAMIN) Take one tablet by mouth daily  #30 x 12   Entered and Authorized by:   Zachery Dauer MD   Signed by:   Zachery Dauer MD on 12/08/2007   Method used:   Historical   RxID:   [id number]  ] Laboratory Results   Blood Tests   Date/Time Received: December 08, 2007 9:44 AM   Date/Time Reported: December 08, 2007 9:57 AM   HGBA1C: 5.6%   (Normal Range: Non-Diabetic - 3-6%  Control Diabetic - 6-8%)  Comments: ...........test performed by...........Marland KitchenTerese Door, CMA

## 2010-05-29 NOTE — Assessment & Plan Note (Signed)
Summary: f/u DM, Htn,df   Vital Signs:  Patient profile:   70 year old male Height:      66.5 inches Weight:      157.4 pounds Pulse rate:   80 / minute BP sitting:   148 / 81  (right arm)  Vitals Entered By: Arlyss Repress CMA, (March 07, 2009 2:24 PM) CC: f/up DM. change of meds. possible refill for metformin Is Patient Diabetic? Yes Pain Assessment Patient in pain? no        Primary Care Provider:  Zachery Dauer  CC:  f/up DM. change of meds. possible refill for metformin.  History of Present Illness: Feeling fine.  capillary blood glucose have been higher 140-150 with change to Metformin. Continues with intermittent diarrhea with occasional cramping.   No chest pain or wheezing  Recent swelling episode of lip, tongue and cheek.   Restarted fish oil   Habits & Providers  Alcohol-Tobacco-Diet     Tobacco Status: never  Allergies (verified): 1)  ! * Maalox 2)  ! Ace Inhibitors  Physical Exam  General:  alert.  well-developed and well-nourished.  Only abdominal obesity Mouth:  Oral mucosa and oropharynx without lesions or exudates.  Teeth in good repair. Lungs:  normal respiratory effort, normal breath sounds, and no crackles.   Heart:  normal rate, regular rhythm, and no murmur.     Impression & Recommendations:  Problem # 1:  DIABETES MELLITUS, TYPE II, CONTROLLED, W/RENAL COMPS (ICD-250.40) Increase glipizide due to higher fasting capillary blood glucoses His updated medication list for this problem includes:    Bayer Childrens Aspirin 81 Mg Chew (Aspirin) .Marland Kitchen... Take 1 tablet by mouth once a day    Glipizide 5 Mg Tabs (Glipizide) .Marland Kitchen... Take one tab daily    Metformin Hcl 1000 Mg Tabs (Metformin hcl) .Marland Kitchen... Take one tab two times a day  Orders: A1C-[facility] (72536) [facility]- Est Level  3 ([address])  Problem # 2:  ESSENTIAL HYPERTENSION, BENIGN (ICD-401.1)  Inadequate control. Increase metoprolol after returns from vacation His updated medication list for this  problem includes:    Doxazosin Mesylate 4 Mg Tabs (Doxazosin mesylate) .Marland Kitchen... Take one tablet daily at bedtime    Hydrochlorothiazide 25 Mg Tabs (Hydrochlorothiazide) .Marland Kitchen... Take 1/2  tab daily    Metoprolol Tartrate 100 Mg Tabs (Metoprolol tartrate) .Marland Kitchen... Take one tablet twice daily  Orders: [facility]- Est Level  3 (47425)  Complete Medication List: 1)  Bayer Childrens Aspirin 81 Mg Chew (Aspirin) .... Take 1 tablet by mouth once a day 2)  Doxazosin Mesylate 4 Mg Tabs (Doxazosin mesylate) .... Take one tablet daily at bedtime 3)  Epipen 2-pak 0.3 Mg/0.38ml (1:1000) Devi (Epinephrine hcl (anaphylaxis)) .... Inject 1 pen intramuscularly as directed 4)  Glipizide 5 Mg Tabs (Glipizide) .... Take one tab daily 5)  Hydrochlorothiazide 25 Mg Tabs (Hydrochlorothiazide) .... Take 1/2  tab daily 6)  Metoprolol Tartrate 100 Mg Tabs (Metoprolol tartrate) .... Take one tablet twice daily 7)  Omeprazole 20 Mg Cpdr (Omeprazole) .... Take one tab each am 8)  Simvastatin 20 Mg Tabs (Simvastatin) .... Take one tablet daily at bedtime 9)  Fish Oil Concentrate 1000 Mg Caps (Omega-3 fatty acids) .... Take one cap two times a day 10)  Vitamin B-12 Cr 1000 Mcg Tbcr (Cyanocobalamin) .... Take one tablet by mouth daily 11)  Freestyle Test Strp (Glucose blood) .... Check fasting blood sugars 12)  Metformin Hcl 1000 Mg Tabs (Metformin hcl) .... Take one tab two times a day  Patient Instructions: 1)  Please schedule a follow-up appointment in 1 month.  2)  Bring meds to appointment. Bring blood pressure and pulse readings.  3)  Your glipizide and metoprolol doses will increase with next prescription. Call if any side effects.  4)  Try to get your Zoster shot.  Prescriptions: METOPROLOL TARTRATE 100 MG TABS (METOPROLOL TARTRATE) Take one tablet twice daily  #60 x 11   Entered and Authorized by:   Zachery Dauer MD   Signed by:   Zachery Dauer MD on 03/08/2009   Method used:   Electronically to        [id number]  (retail)       [address]       [place], [place]  [address]       Ph: [phone] or [phone]       Fax: [phone]   RxID:   [id number] GLIPIZIDE 5 MG TABS (GLIPIZIDE) Take one tab daily  #30 x 11   Entered and Authorized by:   Zachery Dauer MD   Signed by:   Zachery Dauer MD on 03/08/2009   Method used:   Electronically to        [facility] (retail)       [address]       [place], [place]  [address]       Ph: [phone] or [phone]       Fax: [phone]   RxID:   [id number]   Laboratory Results   Blood Tests   Date/Time Received: March 07, 2009 2:26 PM  Date/Time Reported: March 07, 2009 2:39 PM   HGBA1C: 5.8 %   (Normal Range: Non-Diabetic - 3-6%   Control Diabetic - 6-8%)  Comments: ...............test performed by......Marland KitchenBonnie A. [place]az[place], MLS (ASCP)cm        Prevention & Chronic Care Immunizations   Influenza vaccine: Fluvax MCR  (01/31/2009)   Influenza vaccine due: 01/26/2009    Tetanus booster: 03/29/2004: given   Tetanus booster due: 03/29/2014    Pneumococcal vaccine: Done.  (03/23/1996)   Pneumococcal vaccine deferral: Not indicated  (11/22/2008)   Pneumococcal vaccine due: None    H. zoster vaccine: Not documented  Colorectal Screening   Hemoccult: Done.  (06/27/2004)   Hemoccult due: Not Indicated    Colonoscopy: Rectal polyp excised  (08/13/2007)   Colonoscopy due: 08/12/2012  Other Screening   PSA: 3.67  (06/07/2008)   PSA due due: 03/2008   Smoking status: never  (03/07/2009)  Diabetes Mellitus   HgbA1C: 5.8   (03/07/2009)   Hemoglobin A1C due: 06/09/2008    Eye exam: normal  (01/20/2008)   Eye exam due: 01/19/2009    Foot exam: yes  (01/31/2009)   High risk foot: Not documented   Foot care education: completed  (06/07/2008)   Foot exam due: 12/07/2008    Urine microalbumin/creatinine ratio: Not documented   Urine microalbumin action/deferral: Not indicated   Urine microalbumin/cr due: 06/07/2009     Diabetes flowsheet reviewed?: Yes   Progress toward A1C goal: At goal  Lipids   Total Cholesterol: 136  (12/22/2008)   LDL: 37  (12/22/2008)   LDL Direct: Not documented   HDL: 32  (12/22/2008)   Triglycerides: 335  (12/22/2008)    SGOT (AST): 19  (12/22/2008)   SGPT (ALT): 16  (12/22/2008)   Alkaline phosphatase: 42  (12/22/2008)   Total bilirubin: 0.5  (12/22/2008)  Hypertension   Last Blood Pressure: 148 / 81  (03/07/2009)  Serum creatinine: 1.36  (12/22/2008)   Serum potassium 3.9  (12/22/2008)    Hypertension flowsheet reviewed?: Yes   Progress toward BP goal: Unchanged  Self-Management Support :   Personal Goals (by the next clinic visit) :     Personal A1C goal: 7  (01/20/2009)     Personal blood pressure goal: 130/80  (01/20/2009)     Personal LDL goal: 100  (01/20/2009)    Patient will work on the following items until the next clinic visit to reach self-care goals:     Eating: eat more vegetables  (03/07/2009)     Activity: take a 30 minute walk every day  (03/07/2009)     Other: upper body at [facility]  (03/07/2009)    Diabetes self-management support: CBG self-monitoring log  (03/07/2009)   Last diabetes self-management training by diabetes educator: completed    Hypertension self-management support: BP self-monitoring log  (03/07/2009)    Lipid self-management support: Not documented

## 2010-05-29 NOTE — Progress Notes (Signed)
Summary: phn msg  Phone Note Call from Patient Call back at [phone]   Caller: Patient Summary of Call: pt called back and would like to talk to him 1st before an appt is made with neurologist Initial call taken by: De Nurse,  March 16, 2010 3:54 PM  Follow-up for Phone Call        He asked about podiatry referral which I supported, but his symptoms suggest neuropathy so will make neuro referral

## 2010-05-29 NOTE — Consult Note (Signed)
Summary: [facility] NP [facility] NP Eval   Imported By: [facility] KIVETT 01/05/2007 14:45:50  _____________________________________________________________________  External Attachment:    Type:   Image     Comment:   External Document

## 2010-05-29 NOTE — Assessment & Plan Note (Signed)
Summary: Followup DM@   Vital Signs:  Patient Profile:   70 Years Old Male Weight:      156 pounds Temp:     98.7 degrees F Pulse rate:   98 / minute BP sitting:   105 / 70  Pt. in pain?   no  Vitals Entered By: Kathaleen Maser CMA, (July 01, 2006 11:28 AM)              Is Patient Diabetic? Yes    Preventive Care Screening  PSA:    Date:  03/29/2006    Next Due:  03/2008    Results:  normal ng/mL  Last Flu Shot:    Date:  12/28/2005    Next Due:  12/2006    Results:  given   Bone Density:    Next Due:  06/2016  Colonoscopy:    Date:  06/27/2004    Results:  normal   Last Tetanus Booster:    Date:  03/29/2004    Results:  given   Last Pneumovax:    Date:  02/28/1996    Results:  given       Visit Type:  followup PCP:  Zachery Dauer  Chief Complaint:  dm f/up visit.  History of Present Illness: Feeling weak and tired and sore past couple. Maybe low grade fever. Poor appetite, lightheaded.   Buying OTC omeprazole. I faxed the authorization form for it to the PBM yesterday    Diabetes Management History:      The patient is a 70 years old male who comes in for evaluation of DM Type 2.  He has not been enrolled in the "Diabetic Education Program".  He states understanding of dietary principles and is following his diet appropriately.  Sensory loss is noted.  Self foot exams are being performed.  He is checking home blood sugars.  He says that he is exercising.  Type of exercise includes: walking.  Duration of exercise is estimated to be 45 min.  He is doing this 3 times per week.        Hypoglycemic symptoms are not occurring.  No hyperglycemic symptoms are reported.        Symptoms which suggest diabetic complications include sexual dysfunction.  Since his last visit, no infections have occurred.  He's had no treatment plan problems.    Current Allergies: ! * MAALOX ! ACE INHIBITORS   Family History:    Alzheimers - M died age 51    Family History of  Aneurysm Aortic - Father died,  Bro has    Family History Diabetes 1st degree relative -  Bro  Social History:    Married, Oncologist, retired Runner, broadcasting/film/video;     Daughter, Music therapist in [place],     Daughter, Brooke - [place], 2 children      Non-smoker since college;     Alcohol occ    Occupation:teacher,     Regular exercise-yes   Risk Factors:  Tobacco use:  never Exercise:  yes    Times per week:  3    Type:  walking  Colonoscopy History:     Date of Last Colonoscopy:  06/27/2004    Results:  normal    Review of Systems  CV      Denies chest pain or discomfort and difficulty breathing at night.  GU      Complains of nocturia.   Physical Exam  General:     pale.   Lungs:  normal respiratory effort, normal breath sounds, and no crackles.   Heart:     normal rate, regular rhythm, and no murmur.   Pulses:     R & L posterior tibial normal and R dorsalis pedis normal.  R posterior tibial normal and R dorsalis pedis normal.   Extremities:     No clubbing, cyanosis, edema, or deformity noted with normal full range of motion of all joints.    Diabetes Management Exam:    Foot Exam (with socks and/or shoes not present):       Sensory-Pinprick/Light touch:          Left medial foot (L-4): diminished          Left dorsal foot (L-5): diminished          Left lateral foot (S-1): diminished          Right medial foot (L-4): diminished          Right lateral foot (S-1): diminished       Sensory-Monofilament:          Left foot: normal          Right foot: normal       Inspection:          Left foot: normal       Nails:          Left foot: normal          Right foot: normal    Impression & Recommendations:  Problem # 1:  DIABETES MELLITUS II, UNCOMPLICATED (ICD-250.00) Assessment: Unchanged continue diet and exercise Orders: A1C-[facility] (01093) normal  Orders: A1C-[facility] (23557) [facility]- Est  Level 4 (32202)   Problem # 2:  BPH (ICD-600) Assessment: Unchanged   Orders: [facility]- Est  Level 4 (99214)   Problem # 3:  NEUROPATHY, PERIPHERAL (ICD-356.9) Assessment: Unchanged  Orders: [facility]- Est  Level 4 (99214)   Problem # 4:  GASTROESOPHAGEAL REFLUX, NO ESOPHAGITIS (ICD-530.81) Assessment: Unchanged   Orders: [facility]- Est  Level 4 (54270)   Problem # 5:  ANGIOEDEMA (ICD-995.1) Assessment: Unchanged  Dr Thurmon Fair complement tests, ESR, and sed rate were all normal on 10/07 He had prescribed an Epipen  Diabetes Management Assessment/Plan:      The following lipid goals have been established for the patient: Total cholesterol goal of 200; LDL cholesterol goal of 100; HDL cholesterol goal of 40; Triglyceride goal of 200.     Laboratory Results   Blood Tests   Date/Time Recieved: July 01, 2006 12:15 PM   HGBA1C: 5.3%   (Normal Range: Non-Diabetic - 3-6%   Control Diabetic - 6-8%)

## 2010-05-29 NOTE — Miscellaneous (Signed)
Summary: SCHED. PT FOR B12 SHOT  Clinical Lists Changes CALLED PT NEEDS B12 SHOT ...................................................................Alper Guilmette CMA,  November 18, 2006 10:33 AM

## 2010-05-29 NOTE — Assessment & Plan Note (Signed)
Summary: f/u DM,df   Vital Signs:  Patient profile:   70 year old male Height:      66.5 inches Weight:      153.3 pounds BMI:     24.46 Pulse rate:   79 / minute BP sitting:   106 / 64  (left arm)  Vitals Entered By: Arlyss Repress CMA, (November 22, 2008 2:53 PM) CC: f/up DM.  Is Patient Diabetic? Yes  Pain Assessment Patient in pain? no        Primary Care Provider:  Zachery Dauer  CC:  f/up DM. Marland Kitchen  History of Present Illness: Accuchecks fasting usually in the 100 teens. No hypoglycemic symptoms   He's enjoying retirement with his wife.   No heartburn, Occ lip swelling.   Nocuturia x 2, urgency at times  Dysesthesias in his feet when driving a couple hours. Also very stiff in his hips after sitting.   Is aware of the Avandia controversy but not anxious to change meds   Allergies: 1)  ! * Maalox 2)  ! 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  well-developed and well-nourished.   Head:  Soft tissue fullness below both ears "jowls" Mouth:  Lips normal Lungs:  normal respiratory effort, normal breath sounds, and no crackles.   Heart:  normal rate, regular rhythm, and no murmur.   Abdomen:  diastasis recti and mild abdominal obesity  linear thickening right inguinal surgery area, slightly bulging with Valsalva. unchanged Extremities:  No edema  Diabetes Management Exam:    Foot Exam (with socks and/or shoes not present):       Inspection:          Left foot: normal          Right foot: normal       Nails:          Left foot: normal          Right foot: normal   Complete Medication List: 1)  Avandamet 07-998 Mg Tabs (Rosiglitazone-metformin) .... Take one tablet each a.m. 2)   Bayer Childrens Aspirin 81 Mg Chew (Aspirin) .... Take 1 tablet by mouth once a day 3)  Doxazosin Mesylate 4 Mg Tabs (Doxazosin mesylate) .... Take one tablet daily at bedtime 4)  Epipen 2-pak 0.3 Mg/0.89ml (1:1000) Devi (Epinephrine hcl (anaphylaxis)) .... Inject 1 pen intramuscularly as directed 5)  Glipizide 2.5 Mg Tb24 (Glipizide) .... Take one tablet daily 6)  Hydrochlorothiazide 25 Mg Tabs (Hydrochlorothiazide) .... Take 1/2  tab daily 7)  Metoprolol Tartrate 50 Mg Tabs (Metoprolol tartrate) .... Take 1 tablet by mouth twice a day 8)  Omeprazole 20 Mg Cpdr (Omeprazole) .... Take one tab each am 9)  Simvastatin 20 Mg Tabs (Simvastatin) .... Take one tablet daily at bedtime 10)  Fish Oil Concentrate 1000 Mg Caps (Omega-3 fatty acids) .... Take one cap two times a day 11)  Vitamin B-12 Cr 1000 Mcg Tbcr (Cyanocobalamin) .... Take one tablet by mouth daily 12)  Freestyle Test Strp (Glucose blood) .... Check fasting blood sugars 13)  Metformin Hcl 1000 Mg Tabs (Metformin hcl) .... Take one tab each p.m.  Other Orders: A1C-[facility] (09811) [facility]- Est  Level 4 (91478) Future Orders: Lipid-[facility] ([id number]) ... 10/27/2009 CBC-[facility] (  [address]) ... 10/28/2009 Comp Met-[facility] ([id number]) ... 10/27/2009  Patient Instructions: 1)  Please schedule a follow-up appointment in 3 months .  2)  Email [email or web address] [email or web address] with fasting blood glucoses in a couple weeks.  3)  Decrease Avandamet to 1 each A.M. Start metformin 1000 mg each P.M. which is actually maintaining it at the same dose.  4)  The right chest lesion is a seborrheic keratosis which is benign.  5)  Please return for a FASTING Lipid Profile after August 19th  Prescriptions: AVANDAMET 07-998 MG TABS (ROSIGLITAZONE-METFORMIN) Take one tablet each A.M.  #30 x 0   Entered and Authorized by:   Zachery Dauer MD   Signed by:   Zachery Dauer MD on 11/22/2008   Method used:   Electronically to        [facility]       [address]        [place], [place]  [address]       Ph: [phone] or [phone]       Fax: [phone]   RxID:   [id number] METFORMIN HCL 1000 MG TABS (METFORMIN HCL) Take one tab each P.M.  #30 x 11   Entered and Authorized by:   Zachery Dauer MD   Signed by:   Zachery Dauer MD on 11/22/2008   Method used:   Electronically to        [facility] ([facility]ail)       [address]       [place], [place]  [address]       Ph: [phone] or [phone]       Fax: [phone]   RxID:   [id number]    Prevention & Chronic Care Immunizations   Influenza vaccine: given  (01/27/2008)   Influenza vaccine due: 01/26/2009    Tetanus booster: 03/29/2004: given   Tetanus booster due: 03/29/2014    Pneumococcal vaccine: Done.  (03/23/1996)   Pneumococcal vaccine deferral: Not indicated  (11/22/2008)   Pneumococcal vaccine due: None    H. zoster vaccine: Not documented  Colorectal Screening   Hemoccult: Done.  (06/27/2004)   Hemoccult due: Not Indicated    Colonoscopy: Rectal polyp excised  (08/13/2007)   Colonoscopy due: 08/12/2012  Other Screening   PSA: 3.67  (06/07/2008)   PSA due due: 03/2008   Smoking status: quit  (12/02/2006)  Diabetes Mellitus   HgbA1C: 5.3  (11/22/2008)   Hemoglobin A1C due: 06/09/2008    Eye exam: normal  (01/20/2008)   Eye exam due: 01/19/2009    Foot exam: yes  (11/22/2008)   High risk foot: Not documented   Foot care education: completed  (06/07/2008)   Foot exam due: 12/07/2008    Urine microalbumin/creatinine ratio: Not documented   Urine microalbumin action/deferral: Not indicated   Urine microalbumin/cr due: 06/07/2009    Diabetes flowsheet reviewed?: Yes   Progress toward A1C goal: At goal  Lipids   Total Cholesterol: 140  (12/08/2007)   LDL: 49  (12/08/2007)   LDL Direct: Not documented   HDL: 39  (12/08/2007)   Triglycerides: 260  (12/08/2007)    SGOT (AST): 19  (12/08/2007)   SGPT (ALT): 19  (12/08/2007) CMP ordered    Alkaline  phosphatase: 40  (12/08/2007)   Total bilirubin: 0.7  (12/08/2007)  Hypertension   Last Blood Pressure: 106 / 64  (11/22/2008)   Serum creatinine: 1.28  (06/07/2008)   Serum potassium 3.9  (06/07/2008) CMP ordered   Self-Management Support :  Diabetes self-management support: CBG self-monitoring log  (11/22/2008)   Last diabetes self-management training by diabetes educator: completed    Hypertension self-management support: Not documented    Lipid self-management support: Not documented    Laboratory Results   Blood Tests   Date/Time Received: November 22, 2008 2:56 PM  Date/Time Reported: November 22, 2008 3:22 PM   HGBA1C: 5.3%   (Normal Range: Non-Diabetic - 3-6%   Control Diabetic - 6-8%)  Comments: ...............test performed by......Marland KitchenBonnie A. [place], [place] (ASCP)

## 2010-05-29 NOTE — Assessment & Plan Note (Signed)
Summary: f/up DM,tcb   Vital Signs:  Patient profile:   70 year old male Height:      66.5 inches Weight:      156 pounds BMI:     24.89 Pulse rate:   79 / minute BP sitting:   125 / 76  (right arm)  Vitals Entered By: Arlyss Repress CMA, (August 08, 2009 1:39 PM) CC: f/up DM Is Patient Diabetic? Yes Pain Assessment Patient in pain? no        Primary Care Provider:  Zachery Dauer  CC:  f/up DM.  History of Present Illness: Feeling better. No hypoglycemic episodes.    Feet uncomfortable when first up in AM and after prolonged sitting. Hard to describe. No problem walking a distance. Some discomfort between toes 2&3, but no numbness there.   Going to the [facility] about 3 x weekly, wts and elliptical for 60 minutes. No chest pain or dyspnea on exertion Not doing the outside walking the other 2 days.    swelled again, cheek and even more tongue last several times last couple months. cause not apparent. Breathing not affected, Has an Epipen.  Loratidine doesn't help much. Once was concerned about breathing because involved the side of the throat.    Habits & Providers  Alcohol-Tobacco-Diet     Tobacco Status: never  Allergies: 1)  ! * Maalox 2)  ! Ace Inhibitors  Social History: Married, [name], retired Runner, broadcasting/film/video;  Daughter, Marcelino Duster in [place], 2 children 11 and 9 Daughter, Brooke - [place], 2 children, 2 and 4, third on the way Non-smoker since college; 2 years Alcohol occ Occupation:teacher, retired in June, 2010 Regular exercise-yes  Physical Exam  General:  alert.  well-developed and well-nourished.  Only abdominal obesity Lungs:  normal respiratory effort, normal breath sounds, and no crackles.   Heart:  normal rate, regular rhythm, and no murmur.   Pulses:  R posterior tibial normal, R dorsalis pedis normal, L posterior tibial normal, and L dorsalis pedis normal.   Extremities:  No clubbing, cyanosis, edema, or deformity noted with normal full range of motion of  all joints.  Some loss of metatarsal archl. No tenderness between toes.  Neurologic:  alert & oriented X3 and DTRs symmetrical and normal.   Psych:  Cognition and judgment appear intact. Alert and cooperative with normal attention span and concentration. No apparent delusions, illusions, hallucinations  Diabetes Management Exam:    Foot Exam (with socks and/or shoes not present):       Sensory-Pinprick/Light touch:          Left medial foot (L-4): normal          Left dorsal foot (L-5): normal          Left lateral foot (S-1): normal          Right medial foot (L-4): normal          Right dorsal foot (L-5): normal          Right lateral foot (S-1): normal       Sensory-Monofilament:          Left foot: normal          Right foot: normal       Inspection:          Left foot: normal          Right foot: normal       Nails:          Left foot: normal  Right foot: normal   Impression & Recommendations:  Problem # 1:  NEUROPATHY, PERIPHERAL (ICD-356.9)  Unusual in that doesn't keep him awake. No sign of arterial insufficiency  Orders: [facility]- Est Level  3 (56213)  Problem # 2:  ANGIOEDEMA (ICD-995.1)  Has Epipen to use if severe throat swelling.   Orders: [facility]- Est Level  3 (08657)  Problem # 3:  DIABETES MELLITUS, TYPE II, CONTROLLED, W/RENAL COMPS (ICD-250.40) Satisfactory control. Recommended more walking to reduce abdominal obesity His updated medication list for this problem includes:    Bayer Childrens Aspirin 81 Mg Chew (Aspirin) .Marland Kitchen... Take 1 tablet by mouth once a day    Glipizide 5 Mg Tabs (Glipizide) .Marland Kitchen... Take one half  tab daily    Metformin Hcl 1000 Mg Tabs (Metformin hcl) .Marland Kitchen... Take one tab two times a day  Orders: A1C-[facility] (84696) [facility]- Est Level  3 (29528)  Problem # 4:  GASTROESOPHAGEAL REFLUX DISEASE, CHRONIC (ICD-530.81) Assessment: Improved  His updated medication list for this problem includes:    Omeprazole 20 Mg Cpdr (Omeprazole) .Marland Kitchen... Take  one tab each am  Orders: [facility]- Est Level  3 (41324)  Complete Medication List: 1)  Bayer Childrens Aspirin 81 Mg Chew (Aspirin) .... Take 1 tablet by mouth once a day 2)  Doxazosin Mesylate 4 Mg Tabs (Doxazosin mesylate) .... Take one tablet daily at bedtime 3)  Epipen 2-pak 0.3 Mg/0.79ml (1:1000) Devi (Epinephrine hcl (anaphylaxis)) .... Inject 1 pen intramuscularly as directed 4)  Glipizide 5 Mg Tabs (Glipizide) .... Take one half  tab daily 5)  Hydrochlorothiazide 25 Mg Tabs (Hydrochlorothiazide) .... Take 1  tab daily 6)  Metoprolol Tartrate 100 Mg Tabs (Metoprolol tartrate) .... Take one tablet twice daily 7)  Omeprazole 20 Mg Cpdr (Omeprazole) .... Take one tab each am 8)  Simvastatin 20 Mg Tabs (Simvastatin) .... Take one tablet daily at bedtime 9)  Fish Oil Concentrate 1000 Mg Caps (Omega-3 fatty acids) .... Take one cap two times a day 10)  Vitamin B-12 Cr 1000 Mcg Tbcr (Cyanocobalamin) .... Take one tablet by mouth daily 11)  Freestyle Test Strp (Glucose blood) .... Check fasting blood sugars 12)  Metformin Hcl 1000 Mg Tabs (Metformin hcl) .... Take one tab two times a day  Patient Instructions: 1)  Please schedule a follow-up appointment in 3 months .  2)  I'll call if any suggestions for the foot discomfort.  3)  You will walk outside twice weekly.   Laboratory Results   Blood Tests   Date/Time Received: August 08, 2009 1:40 PM  Date/Time Reported: August 08, 2009 1:54 PM   HGBA1C: 6.2%   (Normal Range: Non-Diabetic - 3-6%   Control Diabetic - 6-8%)  Comments: ...........test performed by..........Marland Kitchen Ashlee Dais, SMA .............entered by...........Marland KitchenBonnie A. Swaz[place], MLS (ASCP)cm

## 2010-05-29 NOTE — Consult Note (Signed)
[facility]   Imported By: Haydee Salter 07/20/2007 14:00:58  _____________________________________________________________________  External Attachment:    Type:   Image     Comment:   External Document

## 2010-05-29 NOTE — Consult Note (Signed)
Summary: [facility] Progress Note  [facility] Progress Note   Imported By: Haydee Salter 02/17/2007 14:21:06  _____________________________________________________________________  External Attachment:    Type:   Image     Comment:   External Document

## 2010-05-29 NOTE — Letter (Signed)
Summary: Results Follow-up Letter  [facility]  [address]   [place], [place] [address]   Phone: [phone]  Fax: [phone]    12/26/2008  [address] [place], [place]  [address]  Dear [name],   The following are the results of your recent test(s):  Patient: Tanner Mason Your kidney function and blood count are stable.  Tests: (1) CBC NO Diff (Complete Blood Count) (10000)   Order Note: FASTING   WBC                       4.0 K/uL                    4.0-10.5   RBC                  [L]  3.33 MIL/uL                 4.22-5.81   Hemoglobin           [L]  10.7 g/dL                   44.0-10.2   Hematocrit           [L]  32.9 %                      39.0-52.0   MCV                       98.8 fL                     78.0-100.0   MCHC                      32.5 g/dL                   72.5-36.6   RDW                       14.1 %                      11.5-15.5   Platelet Count            157 K/uL                    150-400  Tests: (2) Comprehensive Metabolic Panel (44034)   Sodium                    142 mEq/L                   135-145   Potassium                 3.9 mEq/L                   3.5-5.3   Chloride                  104 mEq/L                   96-112   CO2                       25 mEq/L                    19-32  Glucose              [H]  114 mg/dL                   16-10   BUN                       20 mg/dL                    9-60   Creatinine                1.36 mg/dL                  0.40-1.50   Bilirubin, Total          0.5 mg/dL                   4.5-4.0   Alkaline Phosphatase      42 U/L                      39-117   AST/SGOT                  19 U/L                      0-37   ALT/SGPT                  16 U/L                      0-53   Total Protein             6.8 g/dL                    9.8-1.1   Albumin                   4.4 g/dL                    9.1-4.7   Calcium                   8.8 mg/dL                   8.2-95.6  Tests:  (3) Lipid Profile (21308)   Cholesterol               136 mg/dL                   6-578        Triglyceride         [H]  335 mg/dL                   <469   HDL Cholesterol      [L]  32 mg/dL                    >62   Total Chol/HDL Ratio      4.3 Ratio  VLDL Cholesterol (Calc)                        [H]  67 mg/dL                    9-52  LDL Cholesterol (Calc)  37 mg/dL                    3-47       Document Creation Date: 12/22/2008 10:32 PM_________ Sincerely,  Zachery Dauer MD [facility]           Appended Document: Results Follow-up Letter Mailed.

## 2010-05-29 NOTE — Consult Note (Signed)
Summary: [facility]   Imported By: Knox Royalty 01/28/2008 11:03:18  _____________________________________________________________________  External Attachment:    Type:   Image     Comment:   External Document

## 2010-05-29 NOTE — Letter (Signed)
Summary: [employer] approved Omeprazole  This patient has been approved to obtain omeprazole from 06/25/07 until 07/15/08, See MD box for hardcopy

## 2010-05-29 NOTE — Miscellaneous (Signed)
Summary: prior auth  Clinical Lists Changes prior auth to md. then will fax to [facility] for omeprazole...Marland KitchenMarland KitchenGolden Circle RN  August 22, 2009 1:37 PM  Completed and returned to Kennon Rounds  Appended Document: prior auth omeprazole approved by [employer]

## 2010-05-29 NOTE — Assessment & Plan Note (Signed)
Summary: b12 shot/ts  Nurse Visit        Prior Medications: AVANDAMET 07-998 MG TABS (ROSIGLITAZONE-METFORMIN)  BAYER CHILDRENS ASPIRIN 81 MG CHEW (ASPIRIN) Take 1 tablet by mouth once a day DOXAZOSIN MESYLATE 4 MG TABS (DOXAZOSIN MESYLATE)  EPIPEN 2-PAK 0.3 MG/0.3ML (1:1000) DEVI (EPINEPHRINE HCL (ANAPHYLAXIS)) Inject 1 pen intramuscularly as directed GLIPIZIDE 2.5 MG TB24 (GLIPIZIDE)  HYDROCHLOROTHIAZIDE 25 MG TABS (HYDROCHLOROTHIAZIDE)  LOVAZA 1 GM CAPS (OMEGA-3-ACID ETHYL ESTERS) Take 2 capsule by mouth twice a day METOPROLOL TARTRATE 50 MG TABS (METOPROLOL TARTRATE) Take 1 tablet by mouth twice a day OMEPRAZOLE 20 MG CPDR (OMEPRAZOLE)  SIMVASTATIN 20 MG TABS (SIMVASTATIN)  ZYRTEC ALLERGY 10 MG TABS (CETIRIZINE HCL) Take 1 tablet by mouth once a day THERAPEUTIC MULTIVITAMIN   TABS (MULTIPLE VITAMIN) Take 1 tab daily Current Allergies: ! * MAALOX ! ACE INHIBITORS    Medication Administration  Injection # 1:    Medication: Vit B12 1000 mcg    Diagnosis: ANEMIA, OTHER, UNSPECIFIED (ICD-285.9)    Route: IM    Site: L deltoid    Exp Date: 04/29/2008    Lot #: [id number]    Mfr: american regent    Patient tolerated injection without complications    Given by: Dedra Skeens CMA, (November 18, 2006 11:34 AM)  Orders Added: 1)  Vit B12 1000 mcg [J3420] 2)  Est Level 1- [facility] [81191]

## 2010-05-29 NOTE — Assessment & Plan Note (Signed)
Summary: shingles vaccine,tcb  Nurse Visit patient has contacted insurance company and he will need to receive Shingles vaccine thru a pharmacy . he has an RX.  no charge for visit today. Theresia Lo RN  November 24, 2009 11:55 AM   Allergies: 1)  ! * Maalox 2)  ! Ace Inhibitors  Orders Added: 1)  No Charge Patient Arrived (NCPA0) [NCPA0]  Appended Document: shingles vaccine,tcb      Allergies: 1)  ! * Maalox 2)  ! Ace Inhibitors   Complete Medication List: 1)  Bayer Childrens Aspirin 81 Mg Chew (Aspirin) .... Take 1 tablet by mouth once a day 2)  Doxazosin Mesylate 4 Mg Tabs (Doxazosin mesylate) .... Take one tablet daily at bedtime 3)  Epipen 2-pak 0.3 Mg/0.61ml (1:1000) Devi (Epinephrine hcl (anaphylaxis)) .... Inject 1 pen intramuscularly as directed 4)  Glipizide 5 Mg Tabs (Glipizide) .... Take one half  tab daily 5)  Hydrochlorothiazide 25 Mg Tabs (Hydrochlorothiazide) .... Take 1  tab daily 6)  Metoprolol Tartrate 100 Mg Tabs (Metoprolol tartrate) .... Take one tablet twice daily 7)  Omeprazole 20 Mg Cpdr (Omeprazole) .... Take one tab each am 8)  Simvastatin 20 Mg Tabs (Simvastatin) .... Take one tablet daily at bedtime 9)  Fish Oil Concentrate 1000 Mg Caps (Omega-3 fatty acids) .... Take one cap two times a day 10)  Vitamin B-12 Cr 1000 Mcg Tbcr (Cyanocobalamin) .... Take one tablet by mouth daily 11)  Freestyle Test Strp (Glucose blood) .... Check fasting blood sugars 12)  Metformin Hcl 1000 Mg Tabs (Metformin hcl) .... Take one tab two times a day 13)  Zostavax 13086 Unt/0.33ml Solr (Zoster vaccine live) .... Injection   Prevention & Chronic Care Immunizations   Influenza vaccine: Fluvax MCR  (01/31/2009)   Influenza vaccine due: 01/26/2009    Tetanus booster: 03/29/2004: given   Tetanus booster due: 03/29/2014    Pneumococcal vaccine: Pneumovax (Medicare)  (04/04/2009)   Pneumococcal vaccine deferral: Not indicated  (11/22/2008)   Pneumococcal vaccine due:  None    H. zoster vaccine: Not documented  Colorectal Screening   Hemoccult: Done.  (06/27/2004)   Hemoccult due: Not Indicated    Colonoscopy: Rectal polyp excised  (08/13/2007)   Colonoscopy due: 08/12/2012  Other Screening   PSA: 2.19  (05/30/2009)   PSA action/deferral: Discussed-PSA requested  (05/30/2009)   PSA due due: 03/2008   Smoking status: never  (11/23/2009)  Diabetes Mellitus   HgbA1C: 5.6  (11/23/2009)   Hemoglobin A1C due: 06/09/2008    Eye exam: normal  (04/04/2009)   Eye exam due: 01/19/2009    Foot exam: yes  (08/08/2009)   High risk foot: Not documented   Foot care education: completed  (06/07/2008)   Foot exam due: 12/07/2008    Urine microalbumin/creatinine ratio: Not documented   Urine microalbumin action/deferral: Not indicated   Urine microalbumin/cr due: 06/07/2009  Lipids   Total Cholesterol: 136  (12/22/2008)   LDL: 37  (12/22/2008)   LDL Direct: Not documented   HDL: 32  (12/22/2008)   Triglycerides: 335  (12/22/2008)    SGOT (AST): 19  (12/22/2008)   SGPT (ALT): 16  (12/22/2008)   Alkaline phosphatase: 42  (12/22/2008)   Total bilirubin: 0.5  (12/22/2008)  Hypertension   Last Blood Pressure: 154 / 84  (11/23/2009)   Serum creatinine: 1.30  (05/30/2009)   Serum potassium 3.8  (05/30/2009)  Self-Management Support :   Personal Goals (by the next clinic visit) :     Personal  A1C goal: 7  (01/20/2009)     Personal blood pressure goal: 130/80  (01/20/2009)     Personal LDL goal: 100  (01/20/2009)    Diabetes self-management support: CBG self-monitoring log  (03/07/2009)   Last diabetes self-management training by diabetes educator: completed    Hypertension self-management support: BP self-monitoring log  (03/07/2009)    Lipid self-management support: Not documented    Nursing Instructions: Give Flu vaccine today Give Herpes zoster vaccine today

## 2010-05-29 NOTE — Letter (Signed)
Summary: *Referral Letter  [facility]  [address]   [place], [place] [address]   Phone: [phone]  Fax: [phone]    01/31/2008  Tanner Mason [address] [place], [place]  [address]  Phone: [phone]  Thank you, Dr Eulah Pont, for the report on Tanner Mason' eye exam.   Current Medical Problems: 1)  DIABETES MELLITUS, TYPE II, CONTROLLED, W/RENAL COMPS (ICD-250.40) 2)  ESSENTIAL HYPERTENSION, BENIGN (ICD-401.1) 3)  DIASTASIS RECTI (ICD-728.84) 4)  SPECIAL SCREENING MALIGNANT NEOPLASM OF PROSTATE (ICD-V76.44) 5)  FAMILY HISTORY DIABETES 1ST DEGREE RELATIVE (ICD-V18.0) 6)  FAMILY HISTORY OF ANEURYSM AORTIC (ICD-V17.4) 7)  NEUROPATHY, PERIPHERAL (ICD-356.9) 8)  INGUINAL HERNIA (ICD-550.90) 9)  HYPERLIPIDEMIA (ICD-272.4) 10)  GASTROESOPHAGEAL REFLUX DISEASE, CHRONIC (ICD-530.81) 11)  BPH (ICD-600) 12)  ANGIOEDEMA (ICD-995.1) 13)  ANEMIA, OTHER, UNSPECIFIED (ICD-285.9)   Current Medications: 1)  AVANDAMET 07-998 MG TABS (ROSIGLITAZONE-METFORMIN) Take one tablet twice daily 2)  BAYER CHILDRENS ASPIRIN 81 MG CHEW (ASPIRIN) Take 1 tablet by mouth once a day 3)  DOXAZOSIN MESYLATE 4 MG TABS (DOXAZOSIN MESYLATE) Take one tablet daily at bedtime 4)  EPIPEN 2-PAK 0.3 MG/0.3ML (1:1000) DEVI (EPINEPHRINE HCL (ANAPHYLAXIS)) Inject 1 pen intramuscularly as directed 5)  GLIPIZIDE 2.5 MG TB24 (GLIPIZIDE) take one tablet daily 6)  HYDROCHLOROTHIAZIDE 25 MG TABS (HYDROCHLOROTHIAZIDE) Take 1/2  tab daily 7)  METOPROLOL TARTRATE 50 MG TABS (METOPROLOL TARTRATE) Take 1 tablet by mouth twice a day 8)  OMEPRAZOLE 20 MG CPDR (OMEPRAZOLE) Take one tab each AM 9)  SIMVASTATIN 20 MG TABS (SIMVASTATIN) Take one tablet daily at bedtime 10)  FISH OIL 300 MG  CAPS (OMEGA-3 FATTY ACIDS) two  two times a day 11)  VITAMIN B-12 CR 1000 MCG  TBCR (CYANOCOBALAMIN) Take one tablet by mouth daily   Pertinent Labs: A1c 5.3   Thank you again for agreeing to see our patient; please  contact us if you have any further questions or need additional information.  Sincerely,  Zachery Dauer MD  Appended Document: *Referral Letter mailed

## 2010-05-29 NOTE — Progress Notes (Signed)
Summary: requesting to speak with Dr. Sheffield Slider       21  22  23  24  25   Phone Note Call from Patient Call back at Home Phone [phone]   Reason for Call: Talk to Doctor Summary of Call: pt sts he had an insurance company obtain his medical records for a policy and they denied it b/c of an abnormal cbc, pt isn't sure what that's about and wants to know if he needs to come back in to have it redone? Initial call taken by: ERIN LEVAN,  November 11, 2006 4:50 PM  Follow-up for Phone Call        Reminded him that he has been mildly anemic for 3 years. Didn't respond to iron.   We will repeat the CBC with other anemia labs, and get the result to the insurance agent if it has normalized.   He will make an appt with me in early August Follow-up by: Zachery Dauer MD,  November 12, 2006 4:56 PM

## 2010-05-29 NOTE — Miscellaneous (Signed)
Summary: Hematology consult  Clinical Lists Changes  Observations: Added new observation of CREATININE: 1.6 mg/dL (14/78/2956 21:30) Added new observation of BG RANDOM: 118 mg/dL (86/57/8469 62:95) Added new observation of PLATELETK/UL: 149 K/uL (11/09/2007 16:19) Added new observation of MCV: 94 fL (11/09/2007 16:19) Added new observation of HCT: 31.3 % (11/09/2007 16:19) Added new observation of HGB: 11.3 g/dL (28/41/3244 01:02) Added new observation of WBC COUNT: 3.9 10*3/microliter (11/09/2007 16:19)  Dr Arline Asp thinks his anemia likely of renal etiology

## 2010-05-29 NOTE — Progress Notes (Signed)
Summary: NEED RECORDS  Phone Note From Other Clinic Call back at [phone]   Caller: [facility] : [facility] Summary of Call: NEED RECORDS FOR REFERRAL Initial call taken by: Levada Schilling,  December 03, 2006 8:41 AM  Follow-up for Phone Call        message left on voicemail that records will be sent as soon as Dr. Sheffield Slider finishes and signs office note from yesterday. Follow-up by: Theresia Lo RN,  December 03, 2006 8:55 AM

## 2010-05-29 NOTE — Consult Note (Signed)
Summary: Hgb11.3, recheck 3 mos  [facility] Progress Note   Imported By: Haydee Salter 08/31/2007 14:40:24  _____________________________________________________________________  External Attachment:    Type:   Image     Comment:   External Document

## 2010-05-29 NOTE — Progress Notes (Signed)
Summary: ct scan/ts  ---- Converted from flag ---- ---- 06/30/2007 10:07 AM, Dennison Nancy RN wrote:   ---- 06/25/2007 5:31 PM, Zachery Dauer MD wrote: Please schedule Abdominal CT without and with iv contrast for evaluation of liver and pancreas as follow-up of the ultrasound report. ------------------------------  sched. above procedure for 07-03-07, 10:30am, at [facility] [address]. pt to pick up contrast. called pt and advised of appt and to pick up contrast. needs prior authorization. will fax the request to ins. co. also, sched. lab visit for tomorrow to check BUN/Creat ...................................................................Verenise Moulin CMA,  June 30, 2007 10:54 AM

## 2010-05-29 NOTE — Assessment & Plan Note (Signed)
Summary: f/u,df   Vital Signs:  Patient profile:   70 year old male Height:      66.5 inches Weight:      152 pounds BMI:     24.25 Temp:     97.9 degrees F oral Pulse rate:   71 / minute BP sitting:   150 / 81  (right arm) Cuff size:   regular  Vitals Entered By: Tessie Fass CMA (March 06, 2010 4:09 PM) CC: F/U Is Patient Diabetic? Yes Pain Assessment Patient in pain? no        Primary Care Provider:  Zachery Dauer  CC:  F/U.  History of Present Illness: Feel well except for discomfort in his legs which requires him to get up and walk around after sitting for a couple hours. Can walk long distances without discomfort in calves. But feet feel like he is walking on eggs. Brother had surgery for iliac disease.   Nocturia x 2, urgency, slightly incontinent if his bladder is too full.   Had a bad episode of tongue and lip swelling recently  Was getting capillary blood glucose fasting ot 170-180 recently, but was using outdated strips with the wrong code. No hypoglycemic symptoms .  Habits & Providers  Alcohol-Tobacco-Diet     Tobacco Status: quit  Current Medications (verified): 1)  Bayer Childrens Aspirin 81 Mg Chew (Aspirin) .... Take 1 Tablet By Mouth Once A Day 2)  Doxazosin Mesylate 4 Mg Tabs (Doxazosin Mesylate) .... Take One Tablet Daily At Bedtime 3)  Epipen 2-Pak 0.3 Mg/0.52ml (1:1000) Devi (Epinephrine Hcl (Anaphylaxis)) .... Inject 1 Pen Intramuscularly As Directed 4)  Glipizide 5 Mg Tabs (Glipizide) .... Take One Half  Tab Daily 5)  Hydrochlorothiazide 25 Mg Tabs (Hydrochlorothiazide) .... Take 1  Tab Daily 6)  Metoprolol Tartrate 100 Mg Tabs (Metoprolol Tartrate) .... Take One Tablet Twice Daily 7)  Omeprazole 20 Mg Cpdr (Omeprazole) .... Take One Tab Each Am 8)  Simvastatin 20 Mg Tabs (Simvastatin) .... Take One Tablet Daily At Bedtime 9)  Fish Oil Concentrate 1000 Mg Caps (Omega-3 Fatty Acids) .... Take One Cap Two Times A Day 10)  Freestyle Test  Strp  (Glucose Blood) .... Check Fasting Blood Sugars 11)  Metformin Hcl 1000 Mg Tabs (Metformin Hcl) .... Take One Tab Two Times A Day  Allergies (verified): 1)  ! * Maalox 2)  ! Ace Inhibitors  Social History: Smoking Status:  quit  Physical Exam  General:  vitals reviewed and rechecked.  alert.  well-developed and well-nourished.   Neck:  no masses.  Parotids less prominent Lungs:  normal respiratory effort, normal breath sounds, and no crackles.   Heart:  normal rate, regular rhythm, and no murmur.   Extremities:  Mild contractures of palmar fascia greater on left, not affecting hand function     Impression & Recommendations:  Problem # 1:  NEUROPATHY, PERIPHERAL (ICD-356.9)  His leg symptoms may be just progression of this, but considering his brother's iliac diseases and irregularity of the aorta seen on his last CT I will get arterial dopplers. If normal, consider referral for evaluation of neuropathy.   Orders: LE Arterial Doppler/ABI (LE arterial doppler)  Problem # 2:  DIABETES MELLITUS, TYPE II, CONTROLLED, W/RENAL COMPS (ICD-250.40) Very well controlled. His updated medication list for this problem includes:    Bayer Childrens Aspirin 81 Mg Chew (Aspirin) .Marland Kitchen... Take 1 tablet by mouth once a day    Glipizide 5 Mg Tabs (Glipizide) .Marland Kitchen... Take one half  tab daily    Metformin Hcl 1000 Mg Tabs (Metformin hcl) .Marland Kitchen... Take one tab two times a day  Orders: A1C-[facility] (64332) [facility]- Est  Level 4 (99214)Future Orders: Comp Met-[facility] ([id number]) ... 02/28/2011  Problem # 3:  ESSENTIAL HYPERTENSION, BENIGN (ICD-401.1) More elevated. Will try to control with increased Doxazosin for convenience since BPH symptoms no worse His updated medication list for this problem includes:    Doxazosin Mesylate 4 Mg Tabs (Doxazosin mesylate) .Marland Kitchen... Take two tablets daily at bedtime    Hydrochlorothiazide 25 Mg Tabs (Hydrochlorothiazide) .Marland Kitchen... Take 1  tab daily    Metoprolol Tartrate 100 Mg Tabs  (Metoprolol tartrate) .Marland Kitchen... Take one tablet twice daily  Orders: [facility]- Est  Level 4 (99214)Future Orders: Comp Met-[facility] ([id number]) ... 02/28/2011  Problem # 4:  ANGIOEDEMA (ICD-995.1) Recurrent. Precludes use of ACE or ARB's Orders: [facility]- Est  Level 4 (23557)  Complete Medication List: 1)  Bayer Childrens Aspirin 81 Mg Chew (Aspirin) .... Take 1 tablet by mouth once a day 2)  Doxazosin Mesylate 4 Mg Tabs (Doxazosin mesylate) .... Take two tablets daily at bedtime 3)  Epipen 2-pak 0.3 Mg/0.28ml (1:1000) Devi (Epinephrine hcl (anaphylaxis)) .... Inject 1 pen intramuscularly as directed 4)  Glipizide 5 Mg Tabs (Glipizide) .... Take one half  tab daily 5)  Hydrochlorothiazide 25 Mg Tabs (Hydrochlorothiazide) .... Take 1  tab daily 6)  Metoprolol Tartrate 100 Mg Tabs (Metoprolol tartrate) .... Take one tablet twice daily 7)  Omeprazole 20 Mg Cpdr (Omeprazole) .... Take one tab each am 8)  Simvastatin 20 Mg Tabs (Simvastatin) .... Take one tablet daily at bedtime 9)  Freestyle Test Strp (Glucose blood) .... Check fasting blood sugars 10)  Metformin Hcl 1000 Mg Tabs (Metformin hcl) .... Take one tab two times a day  Other Orders: Influenza Vaccine MCR (32202) Future Orders: Lipid-[facility] ([id number]) ... 03/30/2011 CBC-[facility] (28315) ... 02/28/2011  Patient Instructions: 1)  Please schedule a follow-up appointment in 1 month.  2)  Call  or email.in 2 weeks with your blood pressure and urinary response to increasing the Doxazosin to 2 of the 4 mg tabs daily.  Prescriptions: FREESTYLE TEST  STRP (GLUCOSE BLOOD) Check fasting blood sugars  #50 x 11   Entered and Authorized by:   Zachery Dauer MD   Signed by:   Zachery Dauer MD on 03/06/2010   Method used:   Electronically to        [facility] ([facility]ail[facility]       [address]       [place], [place]  [address]       Ph: [phone] or [phone]       Fax: [phone]   RxID:   [id number]    Orders Added: 1)  A1C-[facility] [83036] 2)   Influenza Vaccine MCR [00025] 3)  [facility]- Est  Level 4 [10258] 4)  LE Arterial Doppler/ABI [LE arterial doppler] 5)  Comp Met-[facility] [80053-22900] 6)  Lipid-[facility] [80061-22930] 7)  CBC-[facility] [85027]   Immunization History:  Zostavax History:    Zostavax # 1:  zostavax (12/06/2009)  Immunizations Administered:  Influenza Vaccine # 1:    Vaccine Type: Fluvax MCR    Site: right deltoid    Mfr: GlaxoSmithKline    Dose: 0.5 ml    Route: IM    Given by: Tessie Fass CMA    Exp. Date: 10/27/2010    Lot #: [id number]    VIS given: 11/21/09 version given March 06, 2010.  Flu Vaccine Consent Questions:  Do you have a history of severe allergic reactions to this vaccine? no    Any prior history of allergic reactions to egg and/or gelatin? no    Do you have a sensitivity to the preservative Thimersol? no    Do you have a past history of Guillan-Barre Syndrome? no    Do you currently have an acute febrile illness? no    Have you ever had a severe reaction to latex? no    Vaccine information given and explained to patient? yes   Immunization History:  Zostavax History:    Zostavax # 1:  Zostavax (12/06/2009)  Immunizations Administered:  Influenza Vaccine # 1:    Vaccine Type: Fluvax MCR    Site: right deltoid    Mfr: GlaxoSmithKline    Dose: 0.5 ml    Route: IM    Given by: Tessie Fass CMA    Exp. Date: 10/27/2010    Lot #: [id number]    VIS given: 11/21/09 version given March 06, 2010.  Laboratory Results   Blood Tests   Date/Time Received: March 06, 2010 4:05 PM  Date/Time Reported: March 06, 2010 4:27 PM   HGBA1C: 5.9%   (Normal Range: Non-Diabetic - 3-6%   Control Diabetic - 6-8%)  Comments: ...............test performed by......Marland KitchenBonnie A. [place], MLS (ASCP)cm        Prevention & Chronic Care Immunizations   Influenza vaccine: Fluvax MCR  (03/06/2010)   Influenza vaccine due: 01/26/2009    Tetanus booster: 03/29/2004: given   Tetanus booster  due: 03/29/2014    Pneumococcal vaccine: Pneumovax (Medicare)  (04/04/2009)   Pneumococcal vaccine deferral: Not indicated  (11/22/2008)   Pneumococcal vaccine due: None    H. zoster vaccine: 12/06/2009: Zostavax  Colorectal Screening   Hemoccult: Done.  (06/27/2004)   Hemoccult due: Not Indicated    Colonoscopy: Rectal polyp excised  (08/13/2007)   Colonoscopy due: 08/12/2012  Other Screening   PSA: 2.19  (05/30/2009)   PSA action/deferral: Discussed-PSA requested  (05/30/2009)   PSA due due: 03/2008   Smoking status: quit  (03/06/2010)  Diabetes Mellitus   HgbA1C: 5.9  (03/06/2010)   Hemoglobin A1C due: 06/09/2008    Eye exam: normal  (04/04/2009)   Eye exam due: 01/19/2009    Foot exam: yes  (08/08/2009)   High risk foot: Not documented   Foot care education: completed  (06/07/2008)   Foot exam due: 12/07/2008    Urine microalbumin/creatinine ratio: Not documented   Urine microalbumin action/deferral: Not indicated   Urine microalbumin/cr due: 06/07/2009    Diabetes flowsheet reviewed?: Yes   Progress toward A1C goal: At goal  Lipids   Total Cholesterol: 136  (12/22/2008)   LDL: 37  (12/22/2008)   LDL Direct: Not documented   HDL: 32  (12/22/2008)   Triglycerides: 335  (12/22/2008)    SGOT (AST): 19  (12/22/2008)   SGPT (ALT): 16  (12/22/2008) CMP ordered    Alkaline phosphatase: 42  (12/22/2008)   Total bilirubin: 0.5  (12/22/2008)    Lipid flowsheet reviewed?: Yes   Progress toward LDL goal: At goal  Hypertension   Last Blood Pressure: 150 / 81  (03/06/2010)   Serum creatinine: 1.30  (05/30/2009)   Serum potassium 3.8  (05/30/2009) CMP ordered     Hypertension flowsheet reviewed?: Yes   Progress toward BP goal: Deteriorated  Self-Management Support :   Personal Goals (by the next clinic visit) :     Personal A1C goal: 7  (01/20/2009)  Personal blood pressure goal: 130/80  (01/20/2009)     Personal LDL goal: 100  (01/20/2009)    Diabetes  self-management support: CBG self-monitoring log  (03/07/2009)   Last diabetes self-management training by diabetes educator: completed    Hypertension self-management support: BP self-monitoring log  (03/07/2009)    Lipid self-management support: Not documented

## 2010-05-29 NOTE — Progress Notes (Signed)
----   Converted from flag ---- ---- 07/15/2007 5:45 PM, Zachery Dauer MD wrote: Please send the referral letter, Abd CT and ultrasound report, and most recent labs to Dr Dahlia Client at [facility] ------------------------------  records sent as requested .

## 2010-05-29 NOTE — Assessment & Plan Note (Signed)
Summary: f/u htn,df   Vital Signs:  Patient profile:   70 year old male Height:      66.5 inches Weight:      154.3 pounds BMI:     24.62 Pulse rate:   80 / minute BP sitting:   134 / 75  (left arm)  Vitals Entered By: Renato Battles slade,cma CC: f/up HTN and DM Is Patient Diabetic? Yes Pain Assessment Patient in pain? no        Primary Care Provider:  Zachery Dauer  CC:  f/up HTN and DM.  History of Present Illness: Hypoglycemic twice, measured in 50's once before supper, dizzy, disoriented, weak, tired.   Going to the [facility] about 3 x weekly, wts and elliptical for 60 minutes. No chest pain or dyspnea on exertion   cheek and  tongue swelled last several times last month. cause not apparent. Benadryl doesn't help much. Once was concerned about breathing because into the side of the throat.    Habits & Providers  Alcohol-Tobacco-Diet     Tobacco Status: never  Current Medications (verified): 1)  Bayer Childrens Aspirin 81 Mg Chew (Aspirin) .... Take 1 Tablet By Mouth Once A Day 2)  Doxazosin Mesylate 4 Mg Tabs (Doxazosin Mesylate) .... Take One Tablet Daily At Bedtime 3)  Epipen 2-Pak 0.3 Mg/0.57ml (1:1000) Devi (Epinephrine Hcl (Anaphylaxis)) .... Inject 1 Pen Intramuscularly As Directed 4)  Glipizide 5 Mg Tabs (Glipizide) .... Take One Tab Daily 5)  Hydrochlorothiazide 25 Mg Tabs (Hydrochlorothiazide) .... Take 1  Tab Daily 6)  Metoprolol Tartrate 100 Mg Tabs (Metoprolol Tartrate) .... Take One Tablet Twice Daily 7)  Omeprazole 20 Mg Cpdr (Omeprazole) .... Take One Tab Each Am 8)  Simvastatin 20 Mg Tabs (Simvastatin) .... Take One Tablet Daily At Bedtime 9)  Fish Oil Concentrate 1000 Mg Caps (Omega-3 Fatty Acids) .... Take One Cap Two Times A Day 10)  Vitamin B-12 Cr 1000 Mcg  Tbcr (Cyanocobalamin) .... Take One Tablet By Mouth Daily 11)  Freestyle Test  Strp (Glucose Blood) .... Check Fasting Blood Sugars 12)  Metformin Hcl 1000 Mg Tabs (Metformin Hcl) .... Take One Tab Two  Times A Day  Allergies (verified): 1)  ! * Maalox 2)  ! Ace Inhibitors  Physical Exam  Mouth:  No apparent facial swelling Lungs:  normal respiratory effort, normal breath sounds, and no crackles.   Heart:  normal rate, regular rhythm, and no murmur.   Psych:  normally interactive, good eye contact, and not anxious appearing.     Impression & Recommendations:  Problem # 1:  DIABETES MELLITUS, TYPE II, CONTROLLED, W/RENAL COMPS (ICD-250.40) Decreae glipizide due to hypoglycemia risk His updated medication list for this problem includes:    Bayer Childrens Aspirin 81 Mg Chew (Aspirin) .Marland Kitchen... Take 1 tablet by mouth once a day    Glipizide 5 Mg Tabs (Glipizide) .Marland Kitchen... Take one half  tab daily    Metformin Hcl 1000 Mg Tabs (Metformin hcl) .Marland Kitchen... Take one tab two times a day  Orders: Basic Met-[facility] ([address]-60454)  Problem # 2:  ESSENTIAL HYPERTENSION, BENIGN (ICD-401.1)  His updated medication list for this problem includes:    Doxazosin Mesylate 4 Mg Tabs (Doxazosin mesylate) .Marland Kitchen... Take one tablet daily at bedtime    Hydrochlorothiazide 25 Mg Tabs (Hydrochlorothiazide) .Marland Kitchen... Take 1  tab daily    Metoprolol Tartrate 100 Mg Tabs (Metoprolol tartrate) .Marland Kitchen... Take one tablet twice daily  Orders: [facility]- Est  Level 4 (09811)  Problem # 3:  GASTROESOPHAGEAL  REFLUX DISEASE, CHRONIC (ICD-530.81) Assessment: Unchanged  His updated medication list for this problem includes:    Omeprazole 20 Mg Cpdr (Omeprazole) .Marland Kitchen... Take one tab each am  Problem # 4:  ANEMIA, OTHER, UNSPECIFIED (ICD-285.9)  His updated medication list for this problem includes:    Vitamin B-12 Cr 1000 Mcg Tbcr (Cyanocobalamin) .Marland Kitchen... Take one tablet by mouth daily  Orders: CBC-[facility] (13244)  Problem # 5:  ANGIOEDEMA (ICD-995.1) Assessment: Unchanged Epipen renewed  Complete Medication List: 1)  Bayer Childrens Aspirin 81 Mg Chew (Aspirin) .... Take 1 tablet by mouth once a day 2)  Doxazosin Mesylate 4 Mg Tabs (Doxazosin  mesylate) .... Take one tablet daily at bedtime 3)  Epipen 2-pak 0.3 Mg/0.44ml (1:1000) Devi (Epinephrine hcl (anaphylaxis)) .... Inject 1 pen intramuscularly as directed 4)  Glipizide 5 Mg Tabs (Glipizide) .... Take one half  tab daily 5)  Hydrochlorothiazide 25 Mg Tabs (Hydrochlorothiazide) .... Take 1  tab daily 6)  Metoprolol Tartrate 100 Mg Tabs (Metoprolol tartrate) .... Take one tablet twice daily 7)  Omeprazole 20 Mg Cpdr (Omeprazole) .... Take one tab each am 8)  Simvastatin 20 Mg Tabs (Simvastatin) .... Take one tablet daily at bedtime 9)  Fish Oil Concentrate 1000 Mg Caps (Omega-3 fatty acids) .... Take one cap two times a day 10)  Vitamin B-12 Cr 1000 Mcg Tbcr (Cyanocobalamin) .... Take one tablet by mouth daily 11)  Freestyle Test Strp (Glucose blood) .... Check fasting blood sugars 12)  Metformin Hcl 1000 Mg Tabs (Metformin hcl) .... Take one tab two times a day  Other Orders: PSA (Medicare)-[facility] (W1027)  Patient Instructions: 1)  Please schedule a follow-up appointment in 2 months.  2)  Decrease the Glipizide to 1/2 tab each AM.  Prescriptions: GLIPIZIDE 5 MG TABS (GLIPIZIDE) Take one half  tab daily  #15 x 11   Entered and Authorized by:   Zachery Dauer MD   Signed by:   Zachery Dauer MD on 05/30/2009   Method used:   Electronically to        [facility]       [address]       [place], [place]  [address]       Ph: [phone] or [phone]       Fax: [phone]   RxID:   [id number] EPIPEN 2-PAK 0.3 MG/0.3ML (1:1000) DEVI (EPINEPHRINE HCL (ANAPHYLAXIS)) Inject 1 pen intramuscularly as directed  #1 x 1   Entered and Authorized by:   Zachery Dauer MD   Signed by:   Zachery Dauer MD on 05/30/2009   Method used:   Electronically to        [facility] ([facility]       [address]       [place], [place]  [address]       Ph: [phone] or [phone]       Fax: [phone]   RxID:   [id number]    Prevention & Chronic Care Immunizations    Influenza vaccine: Fluvax MCR  (01/31/2009)   Influenza vaccine due: 01/26/2009    Tetanus booster: 03/29/2004: given   Tetanus booster due: 03/29/2014    Pneumococcal vaccine: Pneumovax (Medicare)  (04/04/2009)   Pneumococcal vaccine deferral: Not indicated  (11/22/2008)   Pneumococcal vaccine due: None    H. zoster vaccine: Not documented  Colorectal Screening   Hemoccult: Done.  (06/27/2004)   Hemoccult due: Not Indicated    Colonoscopy: Rectal polyp excised  (08/13/2007)   Colonoscopy  due: 08/12/2012  Other Screening   PSA: 3.67  (06/07/2008)   PSA action/deferral: Discussed-PSA requested  (05/30/2009)   PSA due due: 03/2008   Smoking status: never  (05/30/2009)  Diabetes Mellitus   HgbA1C: 5.8   (03/07/2009)   Hemoglobin A1C due: 06/09/2008    Eye exam: normal  (04/04/2009)   Eye exam due: 01/19/2009    Foot exam: yes  (01/31/2009)   High risk foot: Not documented   Foot care education: completed  (06/07/2008)   Foot exam due: 12/07/2008    Urine microalbumin/creatinine ratio: Not documented   Urine microalbumin action/deferral: Not indicated   Urine microalbumin/cr due: 06/07/2009    Diabetes flowsheet reviewed?: Yes   Progress toward A1C goal: At goal  Lipids   Total Cholesterol: 136  (12/22/2008)   LDL: 37  (12/22/2008)   LDL Direct: Not documented   HDL: 32  (12/22/2008)   Triglycerides: 335  (12/22/2008)    SGOT (AST): 19  (12/22/2008)   SGPT (ALT): 16  (12/22/2008)   Alkaline phosphatase: 42  (12/22/2008)   Total bilirubin: 0.5  (12/22/2008)    Lipid flowsheet reviewed?: Yes   Progress toward LDL goal: At goal  Hypertension   Last Blood Pressure: 134 / 75  (05/30/2009)   Serum creatinine: 1.36  (12/22/2008)   Serum potassium 3.9  (12/22/2008)    Hypertension flowsheet reviewed?: Yes   Progress toward BP goal: Improved  Self-Management Support :   Personal Goals (by the next clinic visit) :     Personal A1C goal: 7  (01/20/2009)      Personal blood pressure goal: 130/80  (01/20/2009)     Personal LDL goal: 100  (01/20/2009)    Diabetes self-management support: CBG self-monitoring log  (03/07/2009)   Last diabetes self-management training by diabetes educator: completed    Hypertension self-management support: BP self-monitoring log  (03/07/2009)    Lipid self-management support: Not documented

## 2010-05-29 NOTE — Miscellaneous (Signed)
Summary: [facility] preauthorization form completed  Clinical Lists Changes Chronic GERD and anemia noted as reasons for chronic Omeprazole need.   Appended Document: [facility] preauthorization form completed omeprazole approved thru 08/15/09. faxed to his pharmacy

## 2010-05-29 NOTE — Miscellaneous (Signed)
Summary: Orders Update  Clinical Lists Changes  Problems: Added new problem of ENCOUNTER FOR LONG-TERM USE OF OTHER MEDICATIONS (ICD-V58.69) Orders: Added new Test order of B12-[facility] [phone]) - Signed Added new Test order of CBC-[facility] (09811) - Signed OK per Dr. Sheffield Slider

## 2010-05-29 NOTE — Progress Notes (Signed)
Summary: phn msg switch to [facility]  Phone Note Call from Patient Call back at [facility] Phone [phone]   Caller: Patient Summary of Call: Just wanted to let us know that for his long term medications he is switching to [facility]. Initial call taken by: Clydell Hakim,  December 13, 2009 11:01 AM  Follow-up for Phone Call        fyi to pcp Follow-up by: Golden Circle RN,  December 13, 2009 11:12 AM     Appended Document: phn msg switch to [facility]    Prescriptions: HYDROCHLOROTHIAZIDE 25 MG TABS (HYDROCHLOROTHIAZIDE) Take 1  tab daily  #90 x 3   Entered and Authorized by:   Zachery Dauer MD   Signed by:   Zachery Dauer MD on 12/13/2009   Method used:   Faxed to ...       [facility] (mail-order)             , [place]         Ph: [phone]       Fax: [phone]   RxID:   [id number] METFORMIN HCL 1000 MG TABS (METFORMIN HCL) Take one tab two times a day  #180 x 3   Entered and Authorized by:   Zachery Dauer MD   Signed by:   Zachery Dauer MD on 12/13/2009   Method used:   Faxed to ...       [facility] (mail-order)             , [place]         Ph: [phone]       Fax: [phone]   RxID:   [id number] SIMVASTATIN 20 MG TABS (SIMVASTATIN) Take one tablet daily at bedtime  #90 x 3   Entered and Authorized by:   Zachery Dauer MD   Signed by:   Zachery Dauer MD on 12/13/2009   Method used:   Faxed to ...       [facility] (mail-order)             , [place]         Ph: [phone]       Fax: [phone]   RxID:   [id number] GLIPIZIDE 5 MG TABS (GLIPIZIDE) Take one half  tab daily  #45 x 3   Entered and Authorized by:   Zachery Dauer MD   Signed by:   Zachery Dauer MD on 12/13/2009   Method used:   Faxed to ...       [facility] (mail-order)             , [place]         Ph: [phone]       Fax: [phone]   RxID:   [id number] DOXAZOSIN MESYLATE 4 MG TABS (DOXAZOSIN MESYLATE) Take one tablet daily at bedtime  #90 Tablet x 3   Entered and Authorized by:   Zachery Dauer MD   Signed by:   Zachery Dauer MD on  12/13/2009   Method used:   Faxed to ...       [facility] (mail-order)             , [place]         Ph: [phone]       Fax: [phone]   RxID:   [id number] METOPROLOL TARTRATE 100 MG TABS (METOPROLOL TARTRATE) Take one tablet twice daily  #180 x 3   Entered and Authorized by:   Zachery Dauer MD   Signed by:   Tyro  Sheffield Slider MD on 12/13/2009   Method used:   Faxed to ...       [facility] (mail-order)             , [place]         Ph: [phone]       Fax: [phone]   RxID:   [id number] OMEPRAZOLE 20 MG CPDR (OMEPRAZOLE) Take one tab each AM  #90 Capsule x 3   Entered and Authorized by:   Zachery Dauer MD   Signed by:   Zachery Dauer MD on 12/13/2009   Method used:   Faxed to ...       [facility] (mail-order)             , [place]         Ph: [phone]       Fax: [phone]   RxID:   [id number]

## 2010-05-29 NOTE — Letter (Signed)
Summary: Results Follow-up Letter  [facility]  [address]   [place], [place] [address]   Phone: [phone]  Fax: [phone]    12/09/2007  [address] [place], [place]  [address]  Dear [name],   The following are the results of your recent test(s): Patient: Tanner Mason The results are good and your kidney function is a little improved, but I'm concerned that we do everything to prevent on-going loss of function. Please schedule a follow-up visit with me to check your urine and see if your blood pressure is ideal.  Tests: (1) Comprehensive Metabolic Panel (57846)   Order Note: FASTING   Sodium                    140 mEq/L                   135-145   Potassium                 3.8 mEq/L                   3.5-5.3   Chloride                  103 mEq/L                   96-112   CO2                       25 mEq/L                    19-32   Glucose              [H]  115 mg/dL                   96-29   BUN                       23 mg/dL                    5-28   Creatinine           [H]  1.52 mg/dL                  0.40-1.50   Bilirubin, Total          0.7 mg/dL                   4.1-3.2   Alkaline Phosphatase      40 U/L                      39-117   AST/SGOT                  19 U/L                      0-37   ALT/SGPT                  19 U/L                      0-53   Total Protein             6.9 g/dL                    4.4-0.1   Albumin  4.5 g/dL                    1.6-1.0   Calcium                   8.6 mg/dL                   9.6-04.5  Tests: (2) Lipid Profile (40981)   Cholesterol               140 mg/dL                   1-914     ATP III Classification:           < 200        mg/dL        Desirable          200 - 239     mg/dL        Borderline High          >= 240        mg/dL        High         Triglyceride         [H]  260 mg/dL                   <782   HDL Cholesterol      [L]  39 mg/dL                    >95  Total Chol/HDL Ratio      3.6 Ratio  VLDL Cholesterol (Calc)                        [H]  52 mg/dL                    6-21  LDL Cholesterol (Calc)                             49 mg/dL                    3-08  Document Creation Date: 12/08/2007 10:42 PM  Sincerely,  Zachery Dauer MD [facility]           Appended Document: Results Follow-up Letter sent

## 2010-05-31 NOTE — Progress Notes (Signed)
Summary: Email, bp on Doxazosin  Phone Note Call from Patient   Caller: Patient Summary of Call: BP 133/84 on 03-13-10 Urinary situation about the same. Email message Initial call taken by: Zachery Dauer MD,  March 28, 2010 4:34 PM  Follow-up for Phone Call        03/26/2010  at 11:30 am , 123/70 pulse 76 , I am experiencing some dizziness when standing up quickly. Is this caused by doubling my Doxazosin ?  Chuck   Follow-up by: Zachery Dauer MD,  March 28, 2010 4:35 PM  Additional Follow-up for Phone Call Additional follow up Details #1::        Likely that is the cause. That side effect is the reason we have you take the dose at bedtime. It usually resolves quickly. If it doesn't we would change to a different BP medicine, especially if your urinary pattern isn't improved.    Additional Follow-up by: Zachery Dauer MD,  March 28, 2010 4:35 PM    Additional Follow-up for Phone Call Additional follow up Details #2::    No answer.  Follow-up by: Zachery Dauer MD,  April 12, 2010 5:36 PM

## 2010-06-14 NOTE — Letter (Signed)
Summary: Results Follow-up Letter  [facility]  [address]   [place], [place] [address]   Phone: [phone]  Fax: [phone]    04/04/2010  [address] [place], [place]  [address]  Dear [name],   The following are the results of your recent test(s):Sorry I delayed sending it  Patient: Tanner Mason  Tests: (1) CMP with Estimated GFR (2402)   Order Note: FASTING   Sodium                    141 mEq/L                   135-145   Potassium                 3.9 mEq/L                   3.5-5.3   Chloride                  105 mEq/L                   96-112   CO2                       26 mEq/L                    19-32   Glucose              [H]  176 mg/dL                   62-95   BUN                       21 mg/dL                    2-84   Creatinine           [H]  1.55 mg/dL                  0.40-1.50   Bilirubin, Total          0.5 mg/dL                   1.3-2.4   Alkaline Phosphatase      45 U/L                      39-117   AST/SGOT                  24 U/L                      0-37   ALT/SGPT                  28 U/L                      0-53   Total Protein             6.5 g/dL                    4.0-1.0   Albumin                   4.4 g/dL  3.5-5.2   Calcium                   8.9 mg/dL                   9.5-09.3 ! Est GFR, African American                        [L]  54 mL/min                   >60 ! Est GFR, NonAfrican American                        [L]  45 mL/min                   >60 Your kidney function has worsened somewhat compared with values earlier this year. Ideal blood pressure control will slow any worsening of this. I may need to make some changes in your medication for diabetes if the creatinine is higher (means worse kidney function) when we check it on your next visit.   Tests: (2) CBC NO Diff (Complete Blood Count) (10000)   WBC                  [L]  3.7 K/uL                    4.0-10.5   RBC                   [L]  3.40 MIL/uL                 4.22-5.81   Hemoglobin           [L]  10.6 g/dL                   26.7-12.4   Hematocrit           [L]  32.8 %                      39.0-52.0   MCV                       96.5 fL                     78.0-100.0 ! MCH                       31.2 pg                     26.0-34.0   MCHC                      32.3 g/dL                   58.0-99.8   RDW                       14.8 %                      11.5-15.5   Platelet Count       [L]  125 K/uL                    150-400  Tests: (3) Lipid Profile (33825)   Cholesterol  111 mg/dL                   4-010     ATP III Classification:           < 200        mg/dL        Desirable          200 - 239     mg/dL        Borderline High          >= 240        mg/dL        High         Triglyceride         [H]  224 mg/dL                   <272   HDL Cholesterol      [L]  30 mg/dL                    >53   Total Chol/HDL Ratio      3.7 Ratio  VLDL Cholesterol (Calc)                        [H]  45 mg/dL                    6-64  LDL Cholesterol (Calc)                             36 mg/dL                    4-03           Total Cholesterol/HDL Ratio:CHD Risk                            Coronary Heart Disease Risk Table                                            Men       Women              1/2 Average Risk              3.4        3.3                  Average Risk              5.0        4.4              2 X Average Risk              9.6        7.1              3 X Average Risk             23.4       11.0     Use the calculated Patient Ratio above and the CHD Risk table      to determine the patient's CHD Risk.     ATP III Classification (LDL):           < 100  mg/dL         Optimal          100 - 129     mg/dL         Near or Above Optimal          130 - 159     mg/dL         Borderline High          160 - 189     mg/dL         High           > 190        mg/dL         Very High        Tests: (4)  Vitamin B12 (08657)   Vitamin B12               306 pg/mL                   211-911  Document Creation Date: 04/04/2010 2:03 AM _______________________________________________________________________  Sincerely,  Zachery Dauer MD [facility]            Appended Document: Results Follow-up Letter mailed

## 2010-07-03 ENCOUNTER — Ambulatory Visit (INDEPENDENT_AMBULATORY_CARE_PROVIDER_SITE_OTHER): Payer: Medicare Other | Admitting: Family Medicine

## 2010-07-03 ENCOUNTER — Other Ambulatory Visit: Payer: Self-pay | Admitting: Family Medicine

## 2010-07-03 ENCOUNTER — Encounter: Payer: Self-pay | Admitting: Family Medicine

## 2010-07-03 VITALS — BP 126/80 | HR 73 | Ht 67.0 in | Wt 153.9 lb

## 2010-07-03 DIAGNOSIS — I10 Essential (primary) hypertension: Secondary | ICD-10-CM

## 2010-07-03 DIAGNOSIS — N179 Acute kidney failure, unspecified: Secondary | ICD-10-CM

## 2010-07-03 DIAGNOSIS — G609 Hereditary and idiopathic neuropathy, unspecified: Secondary | ICD-10-CM

## 2010-07-03 DIAGNOSIS — T783XXA Angioneurotic edema, initial encounter: Secondary | ICD-10-CM

## 2010-07-03 DIAGNOSIS — E1129 Type 2 diabetes mellitus with other diabetic kidney complication: Secondary | ICD-10-CM

## 2010-07-03 LAB — BASIC METABOLIC PANEL
BUN: 28 mg/dL — ABNORMAL HIGH (ref 6–23)
CO2: 24 mEq/L (ref 19–32)
Calcium: 9 mg/dL (ref 8.4–10.5)
Chloride: 99 mEq/L (ref 96–112)
Creat: 1.9 mg/dL — ABNORMAL HIGH (ref 0.40–1.50)
Glucose, Bld: 124 mg/dL — ABNORMAL HIGH (ref 70–99)
Potassium: 3.6 mEq/L (ref 3.5–5.3)
Sodium: 139 mEq/L (ref 135–145)

## 2010-07-03 LAB — CBC
HCT: 30.8 % — ABNORMAL LOW (ref 39.0–52.0)
Hemoglobin: 10.5 g/dL — ABNORMAL LOW (ref 13.0–17.0)
MCH: 31.4 pg (ref 26.0–34.0)
MCHC: 34.1 g/dL (ref 30.0–36.0)
MCV: 92.2 fL (ref 78.0–100.0)
Platelets: 137 10*3/uL — ABNORMAL LOW (ref 150–400)
RBC: 3.34 MIL/uL — ABNORMAL LOW (ref 4.22–5.81)
RDW: 13.9 % (ref 11.5–15.5)
WBC: 4.5 10*3/uL (ref 4.0–10.5)

## 2010-07-03 LAB — CONVERTED CEMR LAB
BUN: 28 mg/dL — ABNORMAL HIGH (ref 6–23)
CO2: 24 meq/L (ref 19–32)
Calcium: 9 mg/dL (ref 8.4–10.5)
Chloride: 99 meq/L (ref 96–112)
Creatinine, Ser: 1.9 mg/dL — ABNORMAL HIGH (ref 0.40–1.50)
Glucose, Bld: 124 mg/dL — ABNORMAL HIGH (ref 70–99)
HCT: 30.8 % — ABNORMAL LOW (ref 39.0–52.0)
Hemoglobin: 10.5 g/dL — ABNORMAL LOW (ref 13.0–17.0)
MCHC: 34.1 g/dL (ref 30.0–36.0)
MCV: 92.2 fL (ref 78.0–100.0)
Platelets: 137 10*3/uL — ABNORMAL LOW (ref 150–400)
Potassium: 3.6 meq/L (ref 3.5–5.3)
RBC: 3.34 M/uL — ABNORMAL LOW (ref 4.22–5.81)
RDW: 13.9 % (ref 11.5–15.5)
Sodium: 139 meq/L (ref 135–145)
WBC: 4.5 10*3/uL (ref 4.0–10.5)

## 2010-07-03 LAB — POCT GLYCOSYLATED HEMOGLOBIN (HGB A1C): Hemoglobin A1C: 6.4

## 2010-07-03 NOTE — Assessment & Plan Note (Signed)
Well controlled 

## 2010-07-03 NOTE — Patient Instructions (Addendum)
I will call with your lab test results.  Continue off the Metformin and on the 1/2 HCTZ daily and return for recheck of creatinine.

## 2010-07-03 NOTE — Assessment & Plan Note (Addendum)
He has been having more frequent episodes, none with throat swelling, but is concerned that this could happen. No apparent precipitants. Dr Sandria Manly asked whether a C1 esterase test had been done. It was years ago. Evaluation by Dr Arline Asp 2009 didn't show a hematologic cause and his anemia didn't respond to B12 or iron. He feels well generally.  I will do a c1esterase and SPEP and seek immunology specialist that he could see for this.

## 2010-07-03 NOTE — Assessment & Plan Note (Signed)
Dr Sandria Manly didn't do any lab or nerve conduction testing, but started him on Metanx.

## 2010-07-03 NOTE — Assessment & Plan Note (Signed)
A1c has increased, but still in well controlled range. He believes he can improve his diet. Will continue his 3 day a week gym workouts and twice weekly walks with his wife.

## 2010-07-03 NOTE — Progress Notes (Signed)
  Subjective:    Patient ID: Tanner Mason, male    DOB: 1940-05-20, 70 y.o.   MRN: [id number]  HPIGenerally feels well, but continues the pedal discomfort. Doesn't keep him awake. Still wonders if it is his circulation.  Metanx hasn't improved the symptoms yet, and costs $1 a pill.     Review of Systems     Objective:   Physical ExamFeet normal without callouses. Position and sharp touch are normal Chest clear Cor RR Abd normal without masses.         Assessment & Plan:  Concern for lymphoproliferative disorder or multiple myeloma.

## 2010-07-05 LAB — CONVERTED CEMR LAB
Albumin ELP: 63.7 % (ref 55.8–66.1)
Alpha-1-Globulin: 4.2 % (ref 2.9–4.9)
Alpha-2-Globulin: 9.1 % (ref 7.1–11.8)
Beta Globulin: 6.1 % (ref 4.7–7.2)
Gamma Globulin: 13.4 % (ref 11.1–18.8)
Total Protein, Serum Electrophoresis: 7.4 g/dL (ref 6.0–8.3)

## 2010-07-05 LAB — C1 ESTERASE INHIBITOR: C1INH SerPl-mCnc: 12 mg/dL (ref 11–26)

## 2010-07-09 ENCOUNTER — Encounter: Payer: Self-pay | Admitting: Home Health Services

## 2010-07-10 LAB — PROTEIN ELECTROPHORESIS, SERUM
Albumin ELP: 63.7 % (ref 55.8–66.1)
Alpha-1-Globulin: 4.2 % (ref 2.9–4.9)
Alpha-2-Globulin: 9.1 % (ref 7.1–11.8)
Beta 2: 3.5 % (ref 3.2–6.5)
Beta Globulin: 6.1 % (ref 4.7–7.2)
Gamma Globulin: 13.4 % (ref 11.1–18.8)
Total Protein, Serum Electrophoresis: 7.4 g/dL (ref 6.0–8.3)

## 2010-07-11 ENCOUNTER — Encounter: Payer: Self-pay | Admitting: Family Medicine

## 2010-07-11 NOTE — Progress Notes (Signed)
  Subjective:    Patient ID: Tanner Mason, male    DOB: 08/12/1940, 70 y.o.   MRN: [id number]  HPI    Review of Systems     Objective:   Physical Exam        Assessment & Plan:  Call from Katherine Roan with [employer] [id number] recommended Corwin Levins, MD, [facility], [address] [place], [place] [address] [phone] I called and confirmed that they see adults Mr Sjogren called and confirmed that they take his insurance and scheduled an appointment later this month.

## 2010-07-20 ENCOUNTER — Other Ambulatory Visit: Payer: Medicare Other

## 2010-07-20 ENCOUNTER — Other Ambulatory Visit: Payer: Self-pay | Admitting: Family Medicine

## 2010-07-20 DIAGNOSIS — E1129 Type 2 diabetes mellitus with other diabetic kidney complication: Secondary | ICD-10-CM

## 2010-07-20 DIAGNOSIS — E1122 Type 2 diabetes mellitus with diabetic chronic kidney disease: Secondary | ICD-10-CM | POA: Insufficient documentation

## 2010-07-20 DIAGNOSIS — D649 Anemia, unspecified: Secondary | ICD-10-CM

## 2010-07-20 LAB — BASIC METABOLIC PANEL
BUN: 18 mg/dL (ref 6–23)
CO2: 22 mEq/L (ref 19–32)
Calcium: 9.2 mg/dL (ref 8.4–10.5)
Chloride: 103 mEq/L (ref 96–112)
Creat: 1.55 mg/dL — ABNORMAL HIGH (ref 0.40–1.50)
Glucose, Bld: 194 mg/dL — ABNORMAL HIGH (ref 70–99)
Potassium: 3.7 mEq/L (ref 3.5–5.3)
Sodium: 139 mEq/L (ref 135–145)

## 2010-07-20 MED ORDER — HYDROCHLOROTHIAZIDE 25 MG PO TABS: 12.5000 mg | ORAL_TABLET | Freq: Every day | ORAL | Status: DC

## 2010-07-20 NOTE — Assessment & Plan Note (Signed)
May be over diuresed. He denies reasons for dehydration. Will stop metformin and half HCTZ dose

## 2010-07-20 NOTE — Progress Notes (Signed)
Addended by: Zachery Dauer on: 07/20/2010 09:20 AM   Modules accepted: Orders

## 2010-07-20 NOTE — Assessment & Plan Note (Signed)
Will recheck creatinine 

## 2010-07-20 NOTE — Progress Notes (Signed)
Drew pt for BMP and Methylmalonic Acid. 2sst. AC

## 2010-07-23 ENCOUNTER — Encounter: Payer: Self-pay | Admitting: Family Medicine

## 2010-07-23 LAB — METHYLMALONIC ACID, SERUM: Methylmalonic Acid, Quantitative: 73 nmol/L — ABNORMAL LOW (ref 87–318)

## 2010-09-14 NOTE — Op Note (Signed)
NAME:  Tanner Mason, Tanner Mason            ACCOUNT NO.:  [id number]   MEDICAL RECORD NO.:  [id number]          PATIENT TYPE:  AMB   LOCATION:  ENDO                         FACILITY:  [facility]   PHYSICIAN:  James L. Malon Kindle., M.D.DATE OF BIRTH:  05/29/1940   DATE OF PROCEDURE:  06/08/2004  DATE OF DISCHARGE:                                 OPERATIVE REPORT   PROCEDURE:  Colonoscopy.   MEDICATIONS:  Fentanyl 75 mcg, Versed 8 mg IV.   INDICATIONS FOR PROCEDURE:  Heme positive stool, strong family history of  polyps in multiple family members.   DESCRIPTION OF PROCEDURE:  The procedure had been explained to the patient  and consent obtained. With the patient in the left lateral decubitus  position, the Olympus scope was inserted and advanced. The prep was  excellent. We were able to advance up to the cecum using abdominal pressure  and position changes. The cecum including the ileocecal valve and  appendiceal orifice were seen well. The ileocecal valve was quite prominent.  The scope was withdrawn and the cecum, ascending colon, hepatic flexure,  transverse colon, splenic flexure, descending and sigmoid colon were seen  well. The transverse, descending and sigmoid colon seen, no polyps seen, no  significant diverticular disease. The scope was withdrawn in the rectum and  the rectum was free of polyps. The scope withdrawn, the patient tolerated  the procedure.   ASSESSMENT:  1.  Heme positive stool with essentially negative colonoscopy, 792.1.  2.  Strong family history of colonic neoplasia, V16.0.   RECOMMENDATIONS:  Will recommend seeing him back in the office in 4-6 weeks.  Recommend yearly Hemoccults and repeat colonoscopy in five years.      JLE/MEDQ  D:  06/08/2004  T:  06/08/2004  Job:  [id number]   cc:   Deniece Portela A. Sheffield Slider, M.D.  Fax: [phone]

## 2010-09-14 NOTE — Op Note (Signed)
NAME:  Tanner Mason, Tanner Mason            ACCOUNT NO.:  [id number]   MEDICAL RECORD NO.:  [id number]          PATIENT TYPE:  AMB   LOCATION:  [facility]                         FACILITY:  [facility]   PHYSICIAN:  Thornton Park. Daphine Deutscher, MD  DATE OF BIRTH:  11-12-1940   DATE OF PROCEDURE:  12/02/2005  DATE OF DISCHARGE:                                 OPERATIVE REPORT   PREOPERATIVE DIAGNOSIS:  Right inguinal hernia.   POSTOPERATIVE DIAGNOSIS:  Right direct inguinal hernia with hernia involving  pretty much the entire floor.   PROCEDURE:  Primary reapproximation of native tissue with onlay of Bard  polypropylene mesh.   DESCRIPTION OF PROCEDURE:  [name] was taken to room 4 at [facility], and the right inguinal region was clipped and prepped with  Betadine and draped sterilely.  I made a small oblique incision in that  region and carried it down to the fatty tissue of the external oblique.  I  then saw where the hernia had appeared to be going out to the external ring,  and I opened along the fibers and then eventually mobilized the cord,  although the hernia was a very large direct hernia that was really  displacing the cord structures inferiorly.  I mobilized the cord structures  and elevated it with a Penrose drain, freed it up to the internal ring.  I  explored the internal ring, did not see an indirect sac.  I then tucked the  large, protuberant, fatty, direct hernia inside, and then closed this with  running 2-0 Prolene and then a simple interrupted 2-0 Prolene to keep it  from bulging out the floor.  I cut a piece of mesh to fit and sutured along  the inguinal ligament inferiorly and medially to the fascia, bringing it  around the cord and suturing it in placed a single horizontal mattress  suture of 2-0 Prolene.  This was tucked beneath the external oblique, which  was then closed running 2-0 Vicryl.  The area was infiltrated with Marcaine  and lidocaine mixture.  A 4-0  Vicryl used to align a skin scratch that I  used to lie the skin incision, using 4-0 Vicryl and subcutaneous closure.  I  then closed the skin with interrupted 4-0 Vicryl subcuticularly, and then  with Dermabond.  The patient tolerated procedure well.  He is given Percocet  5/325 to take for pain.  He will be followed up in the office in about 3  weeks.      Thornton Park Daphine Deutscher, MD  Electronically Signed     MBM/MEDQ  D:  12/02/2005  T:  12/02/2005  Job:  [id number]   cc:   Deniece Portela A. Sheffield Slider, M.D.

## 2010-09-26 ENCOUNTER — Telehealth (INDEPENDENT_AMBULATORY_CARE_PROVIDER_SITE_OTHER): Payer: Medicare Other | Admitting: Family Medicine

## 2010-09-26 DIAGNOSIS — S2249XA Multiple fractures of ribs, unspecified side, initial encounter for closed fracture: Secondary | ICD-10-CM

## 2010-09-26 DIAGNOSIS — S2241XA Multiple fractures of ribs, right side, initial encounter for closed fracture: Secondary | ICD-10-CM

## 2010-09-26 DIAGNOSIS — K219 Gastro-esophageal reflux disease without esophagitis: Secondary | ICD-10-CM

## 2010-09-26 NOTE — Telephone Encounter (Signed)
Pt says he received a call from [facility] that we need to send in new Rxs for his omeprazole, asked pt if he asked pharmacy to send a refill request, pt says they told him to call us, pt also cracked 5 ribs over the weekend [place] prescribed an rx for pain, pt wants to know if MD would be willing to refill the pain medication?

## 2010-09-27 DIAGNOSIS — S2241XA Multiple fractures of ribs, right side, initial encounter for closed fracture: Secondary | ICD-10-CM | POA: Insufficient documentation

## 2010-09-27 MED ORDER — HYDROCODONE-ACETAMINOPHEN 10-500 MG PO TABS: 1.0000 | ORAL_TABLET | Freq: Four times a day (QID) | ORAL | Status: DC | PRN

## 2010-09-27 MED ORDER — OMEPRAZOLE 20 MG PO CPDR: 20.0000 mg | DELAYED_RELEASE_CAPSULE | ORAL | Status: DC

## 2010-09-27 NOTE — Telephone Encounter (Signed)
He slipped on the carpet going down the steps at [facility] and hit his back and R side. At the [facility] in [place] he had xrays that confirmed R lower rib fractures. He was given 20 Lortab 10/500 which help but he only has 5 left.   I will also send in the Omeprazole to [facility] but asked to have them send the request electronically in the future.   He will schedule a follow up appointment with me next week

## 2010-09-27 NOTE — Telephone Encounter (Signed)
Will fwd. To Dr.Hale. .Tanner Mason  

## 2010-09-27 NOTE — Telephone Encounter (Signed)
Pt is calling again to speak with Dr Sheffield Slider.

## 2010-09-28 ENCOUNTER — Telehealth: Payer: Self-pay | Admitting: *Deleted

## 2010-09-28 NOTE — Telephone Encounter (Signed)
Called pt and informed, that I have called in Rx (Lortab) to [facility] [address]  (spoke with pharmacist Mellody Dance. They have not received the Rx, that Dr.Hale wrote yesterday) .Tanner Mason

## 2010-10-02 ENCOUNTER — Ambulatory Visit (INDEPENDENT_AMBULATORY_CARE_PROVIDER_SITE_OTHER): Payer: Medicare Other | Admitting: Family Medicine

## 2010-10-02 ENCOUNTER — Encounter: Payer: Self-pay | Admitting: Family Medicine

## 2010-10-02 VITALS — BP 134/79 | HR 73 | Ht 66.5 in | Wt 151.9 lb

## 2010-10-02 DIAGNOSIS — S2241XA Multiple fractures of ribs, right side, initial encounter for closed fracture: Secondary | ICD-10-CM

## 2010-10-02 DIAGNOSIS — I10 Essential (primary) hypertension: Secondary | ICD-10-CM

## 2010-10-02 DIAGNOSIS — T783XXA Angioneurotic edema, initial encounter: Secondary | ICD-10-CM

## 2010-10-02 DIAGNOSIS — S2249XA Multiple fractures of ribs, unspecified side, initial encounter for closed fracture: Secondary | ICD-10-CM

## 2010-10-02 DIAGNOSIS — E1129 Type 2 diabetes mellitus with other diabetic kidney complication: Secondary | ICD-10-CM

## 2010-10-02 LAB — POCT GLYCOSYLATED HEMOGLOBIN (HGB A1C): Hemoglobin A1C: 7.9

## 2010-10-02 MED ORDER — HYDROCODONE-ACETAMINOPHEN 10-500 MG PO TABS: 1.0000 | ORAL_TABLET | Freq: Four times a day (QID) | ORAL | Status: AC | PRN

## 2010-10-02 MED ORDER — GLIPIZIDE 5 MG PO TABS: 5.0000 mg | ORAL_TABLET | Freq: Two times a day (BID) | ORAL | Status: DC

## 2010-10-02 NOTE — Patient Instructions (Signed)
Today's A1c was 7.9 goal under 7.0  Change Glipizide to a whole 5 mg tablet twice daily  Return to see Dr Sheffield Slider in one month.

## 2010-10-02 NOTE — Progress Notes (Signed)
  Subjective:    Patient ID: Tanner Mason, male    DOB: [birth year], 70 y.o.   MRN: [id number]  HPI he continues to have pain in his right chest laterally where he has the 5 fractured ribs. He's been using hydrocodone every 4 hours. He now has a slight cough but has not had shortness of breath and has remained active. His chest hurts a lot when he goes from lying to standing. He is developed bruising in the right flank below where the rib fractures occurred.  His blood sugars have been often over 200 and he wonders if the metformin could be restarted. Has not had any hypoglycemic episodes  Has not followed up with Dr. Larina Bras the allergy specialist. He was started on Claritin which is so far prevent a recurrence of his angioedema.  Review of Systems  Constitutional: Positive for activity change. Negative for fever.  Respiratory: Positive for cough. Negative for shortness of breath.   Cardiovascular: Positive for chest pain.  Genitourinary: Negative for difficulty urinating.  Musculoskeletal: Positive for back pain.       Objective:   Physical Exam He appears thinner but moves well Chest clear without crackles. Tender to palpation over the posterior axillary line of the lower ribs Skin is a large ecchymotic area in the right lower flank Heart regular rhythm without murmur Ankles no edema Feet have normal sensation to filament testing no abnormal calluses does have loss of metatarsal arch and slight hammering of toes       Assessment & Plan:

## 2010-10-03 ENCOUNTER — Other Ambulatory Visit (INDEPENDENT_AMBULATORY_CARE_PROVIDER_SITE_OTHER): Payer: Medicare Other | Admitting: Family Medicine

## 2010-10-03 ENCOUNTER — Encounter: Payer: Self-pay | Admitting: Family Medicine

## 2010-10-03 DIAGNOSIS — E1129 Type 2 diabetes mellitus with other diabetic kidney complication: Secondary | ICD-10-CM

## 2010-10-03 MED ORDER — GLIPIZIDE 5 MG PO TABS: 5.0000 mg | ORAL_TABLET | Freq: Two times a day (BID) | ORAL | Status: DC

## 2010-10-03 NOTE — Assessment & Plan Note (Signed)
well controlled  

## 2010-10-03 NOTE — Assessment & Plan Note (Signed)
No episodes since on Loratidine

## 2010-10-03 NOTE — Telephone Encounter (Signed)
Rx resent and also corrected on form faxed back. Error was old sig was combined with new.

## 2010-10-03 NOTE — Assessment & Plan Note (Signed)
Remain painful. No sign of complication

## 2010-10-03 NOTE — Assessment & Plan Note (Signed)
Worsened control off the Metformin. Creatinine back close to 1.5. Will try maximum Glipizide then recheck creatinine

## 2010-10-17 ENCOUNTER — Other Ambulatory Visit: Payer: Self-pay | Admitting: Family Medicine

## 2010-10-30 ENCOUNTER — Other Ambulatory Visit: Payer: Self-pay | Admitting: Family Medicine

## 2010-10-30 NOTE — Telephone Encounter (Signed)
Refill request

## 2010-11-02 ENCOUNTER — Encounter: Payer: Self-pay | Admitting: Family Medicine

## 2010-11-14 ENCOUNTER — Telehealth: Payer: Self-pay | Admitting: Family Medicine

## 2010-11-14 DIAGNOSIS — K219 Gastro-esophageal reflux disease without esophagitis: Secondary | ICD-10-CM

## 2010-11-14 MED ORDER — RANITIDINE HCL 150 MG PO CAPS: 150.0000 mg | ORAL_CAPSULE | Freq: Every evening | ORAL | Status: DC

## 2010-11-14 NOTE — Telephone Encounter (Signed)
I spoke with him and recommended that he try Ranitidine in place of Omeprazole in hopes it will also help keep Angioedema away. If his GERD sx recur it will justify Omeprazole

## 2010-11-20 ENCOUNTER — Ambulatory Visit: Payer: Medicare Other | Admitting: Family Medicine

## 2010-11-27 ENCOUNTER — Encounter: Payer: Self-pay | Admitting: Family Medicine

## 2010-11-27 ENCOUNTER — Ambulatory Visit (INDEPENDENT_AMBULATORY_CARE_PROVIDER_SITE_OTHER): Payer: Medicare Other | Admitting: Family Medicine

## 2010-11-27 ENCOUNTER — Encounter: Payer: Medicare Other | Admitting: Home Health Services

## 2010-11-27 ENCOUNTER — Ambulatory Visit: Payer: Medicare Other | Admitting: Home Health Services

## 2010-11-27 VITALS — BP 125/76 | HR 74 | Temp 98.3°F | Ht 66.5 in | Wt 151.0 lb

## 2010-11-27 DIAGNOSIS — K219 Gastro-esophageal reflux disease without esophagitis: Secondary | ICD-10-CM

## 2010-11-27 DIAGNOSIS — E1129 Type 2 diabetes mellitus with other diabetic kidney complication: Secondary | ICD-10-CM

## 2010-11-27 DIAGNOSIS — T783XXA Angioneurotic edema, initial encounter: Secondary | ICD-10-CM

## 2010-11-27 DIAGNOSIS — M949 Disorder of cartilage, unspecified: Secondary | ICD-10-CM

## 2010-11-27 DIAGNOSIS — I10 Essential (primary) hypertension: Secondary | ICD-10-CM

## 2010-11-27 DIAGNOSIS — R5383 Other fatigue: Secondary | ICD-10-CM

## 2010-11-27 DIAGNOSIS — M899 Disorder of bone, unspecified: Secondary | ICD-10-CM

## 2010-11-27 DIAGNOSIS — S2241XA Multiple fractures of ribs, right side, initial encounter for closed fracture: Secondary | ICD-10-CM

## 2010-11-27 DIAGNOSIS — S2249XA Multiple fractures of ribs, unspecified side, initial encounter for closed fracture: Secondary | ICD-10-CM

## 2010-11-27 DIAGNOSIS — M6200 Separation of muscle (nontraumatic), unspecified site: Secondary | ICD-10-CM

## 2010-11-27 DIAGNOSIS — N179 Acute kidney failure, unspecified: Secondary | ICD-10-CM

## 2010-11-27 DIAGNOSIS — R531 Weakness: Secondary | ICD-10-CM

## 2010-11-27 DIAGNOSIS — R5381 Other malaise: Secondary | ICD-10-CM

## 2010-11-27 LAB — COMPREHENSIVE METABOLIC PANEL
ALT: 26 U/L (ref 0–53)
AST: 18 U/L (ref 0–37)
Albumin: 4.7 g/dL (ref 3.5–5.2)
Alkaline Phosphatase: 75 U/L (ref 39–117)
BUN: 27 mg/dL — ABNORMAL HIGH (ref 6–23)
CO2: 22 mEq/L (ref 19–32)
Calcium: 9.5 mg/dL (ref 8.4–10.5)
Chloride: 98 mEq/L (ref 96–112)
Creat: 1.87 mg/dL — ABNORMAL HIGH (ref 0.50–1.35)
Glucose, Bld: 304 mg/dL — ABNORMAL HIGH (ref 70–99)
Potassium: 3.6 mEq/L (ref 3.5–5.3)
Sodium: 135 mEq/L (ref 135–145)
Total Bilirubin: 0.7 mg/dL (ref 0.3–1.2)
Total Protein: 7.2 g/dL (ref 6.0–8.3)

## 2010-11-27 LAB — POCT GLYCOSYLATED HEMOGLOBIN (HGB A1C): Hemoglobin A1C: 7.5

## 2010-11-27 MED ORDER — GLIPIZIDE 5 MG PO TABS: ORAL_TABLET | ORAL | Status: DC

## 2010-11-27 NOTE — Progress Notes (Signed)
a 

## 2010-11-27 NOTE — Patient Instructions (Addendum)
Call or email Dr Sheffield Slider with blood sugar readings in 2 weeks on Glipizide 3 daily.   Take Ranitidine one twice daily.   Recheck in 3 months.

## 2010-11-28 ENCOUNTER — Telehealth: Payer: Self-pay | Admitting: *Deleted

## 2010-11-28 ENCOUNTER — Encounter: Payer: Self-pay | Admitting: Family Medicine

## 2010-11-28 NOTE — Progress Notes (Signed)
  Subjective:    Patient ID: Tanner Mason, male    DOB: 11-24-1940, 70 y.o.   MRN: [id number]  HPIHe hasn't been feeling bad, but his fasting blood sugars have been in the 170-180 range and sugars later in the day have been over 300.   Angioedema - minimal swellings of lips at times on the Loratidine  Polyneuropathy - Tanner Mason discontinued the Metanx which wasn't helping. The symptoms haven't been as bad.   Osteoporosis - his brother has this diagnosis and has broken a wrist. Tanner Mason continues with a discomfort over the left lower lateral ribs like something is loose, but is no longer taking pain medication.   Diastasis recti - seems worse and he's concerned about herniation though no symptoms of that.    Review of Systems     Objective:   Physical Exam  Constitutional: He appears well-developed and well-nourished.  Cardiovascular: Normal rate and regular rhythm.   Pulmonary/Chest: Effort normal and breath sounds normal.  Abdominal: Soft. Bowel sounds are normal. He exhibits no distension.       Mildly tender over the right 11th rib in the mid-axillary line.No crepitus heard there  Musculoskeletal:       Normal exam of feet, no callouses, subjectively decreased sensation to touch, but can feel the flilament          Assessment & Plan:

## 2010-11-28 NOTE — Assessment & Plan Note (Signed)
well controlled Hoping Ranitidine will help, but thus far it isn't controlling the GERD symptoms

## 2010-11-28 NOTE — Assessment & Plan Note (Signed)
Poorly controlled and his increased creatinine rule out using Metformin and too many concerns about Actos. I believe he'll need insulin, but will try maximum dose Glipizide first.

## 2010-11-28 NOTE — Assessment & Plan Note (Signed)
Not well controlled by Ranitidine 150, so will maximize dose at 130 bid. He has Omeprazole to use for breakthrough symptoms

## 2010-11-28 NOTE — Assessment & Plan Note (Signed)
The creatinine is back up again without apparent cause. He may need a renal consult. SPEP, UPEP first

## 2010-11-28 NOTE — Assessment & Plan Note (Signed)
well controlled  

## 2010-11-28 NOTE — Telephone Encounter (Signed)
Called and informed patient of bone density scan at the [facility] on 12/06/10 at 2:30.Busick, Rodena Medin

## 2010-11-29 ENCOUNTER — Ambulatory Visit (INDEPENDENT_AMBULATORY_CARE_PROVIDER_SITE_OTHER): Payer: Medicare Other | Admitting: Home Health Services

## 2010-11-29 ENCOUNTER — Encounter: Payer: Self-pay | Admitting: Home Health Services

## 2010-11-29 ENCOUNTER — Encounter: Payer: Self-pay | Admitting: Family Medicine

## 2010-11-29 VITALS — BP 122/71 | HR 75 | Temp 97.1°F | Ht 67.0 in | Wt 149.0 lb

## 2010-11-29 DIAGNOSIS — Z0000 Encounter for general adult medical examination without abnormal findings: Secondary | ICD-10-CM

## 2010-11-29 NOTE — Progress Notes (Signed)
Patient here for annual wellness visit, patient reports: Risk Factors/Conditions needing evaluation or treatment: Pt does not have any risk factors that need evaluation. Home Safety: Pt lives with wife in 1 story home. Pt reports having smoke detectors and some adaptive equipment in bathroom. Other Information: Corrective lens: Pt wears daily corrective lens and visits eye doctor annually.  Dentures: Pt does not have dentures.  Memory: Pt denies memory problems. Patient's Mini Mental Score (recorded in doc. flowsheet): 30  Balance/Gait: Pt does not have any noticeable impairments.   Pt reports feeling like walking on marshmallows in his feet.  Balance Abnormal Patient value  Sitting balance    Sit to stand    Attempts to arise    Immediate standing balance    Standing balance    Nudge    Eyes closed- Romberg    Tandem stance    Back lean    Neck Rotation    360 degree turn    Sitting down     Gait Abnormal Patient value  Initiation of gait    Step length-left    Step length-right    Step height-left    Step height-right    Step symmetry    Step continuity    Path deviation    Trunk movement    Walking stance        Annual Wellness Visit Requirements Recorded Today In  Medical, family, social history Past Medical, Family, Social History Section  Current providers Care team  Current medications Medications  Wt, BP, Ht, BMI Vital signs  Hearing assessment (welcome visit) Hearing/vision  Tobacco, alcohol, illicit drug use History  ADL Nurse Assessment  Depression Screening Nurse Assessment  Cognitive impairment Nurse Assessment  Mini Mental Status Document Flowsheet  Fall Risk Nurse Assessment  Home Safety Progress Note  End of Life Planning (welcome visit) Social Documentation  Medicare preventative services Progress Note  Risk factors/conditions needing evaluation/treatment Progress Note  Personalized health advice Patient Instructions, goals, letter  Diet &  Exercise Social Documentation  Emergency Contact Social Documentation  Seat Belts Social Documentation  Sun exposure/protection Social Documentation    Medicare Prevention Plan: PT is up to date on recommended screenings.   Recommended Medicare Prevention Screenings Men over 25 Test For Frequency Date of Last- BOLD if needed  Colorectal Cancer 1-10 yrs 4/09  Prostate Cancer Never or yearly 2/11  Aortic Aneurysm Once if 65-75 with hx of smoking Pt reported done  Cholesterol 5 yrs 12/11  Diabetes yearly 7/12  HIV yearly declined  Influenza Shot yearly 2012  Pneumonia Shot once 12/10  Zostavax Shot once 8/11

## 2010-11-29 NOTE — Progress Notes (Signed)
  Subjective:    Patient ID: Tanner Mason, male    DOB: 06-27-1940, 70 y.o.   MRN: [id number]  HPI    Review of Systems     Objective:   Physical Exam        Assessment & Plan:  I have reviewed this visit and discussed with Tanner Mason and agree with her documentation. Tanner Mason A. Sheffield Slider, MD

## 2010-11-29 NOTE — Patient Instructions (Signed)
1. Continue to exercise 3 times a week at [facility]. 2. Focus on eating 3-4 vegetables a day and pairing carbohydrates with protein at meals. 3. Consider completing Living Will and bring a copy to Dr. Sheffield Slider.

## 2010-12-06 ENCOUNTER — Ambulatory Visit
Admission: RE | Admit: 2010-12-06 | Discharge: 2010-12-06 | Disposition: A | Discharge status: Home / Self Care | Payer: Medicare Other | Source: Ambulatory Visit | Attending: Family Medicine | Admitting: Family Medicine

## 2010-12-06 DIAGNOSIS — S2241XA Multiple fractures of ribs, right side, initial encounter for closed fracture: Secondary | ICD-10-CM

## 2010-12-10 ENCOUNTER — Encounter: Payer: Self-pay | Admitting: Family Medicine

## 2010-12-16 ENCOUNTER — Other Ambulatory Visit: Payer: Self-pay | Admitting: Family Medicine

## 2010-12-16 NOTE — Telephone Encounter (Signed)
Refill request

## 2010-12-22 ENCOUNTER — Other Ambulatory Visit: Payer: Self-pay | Admitting: Family Medicine

## 2010-12-22 DIAGNOSIS — E1129 Type 2 diabetes mellitus with other diabetic kidney complication: Secondary | ICD-10-CM

## 2010-12-22 NOTE — Telephone Encounter (Signed)
Here are some blood sugar numbers from the last two weeks: 8/3   7:30 am   201 8/7   7:30 am   226 8/8    7:30 am   220 8/9   6:00 am   183 8/14   8:00 am  196 8/14   11;00 am  275 8/14   7:00 pm   201 8/15   8:00 am   206 8/16   8:30 am   195  These numbers are while taking three glipizide tablets per day.  I would like to find an alternative to injecting insulin, however I don't want to jeopardize liver or kidney functions in the process.     I did receive the results from the blood test and the bone density test. You can let me know what we need to do.  Chuck, via email  I recommend that you try Januvia which has worked well for several of my patients. It's expensive since it isn't generic yet. If your blood sugars drop to below 125 fasting, decrease the Glipizide to two 5 mg tablets daily. Send me your sugar results after you are on the Januvia a couple weeks.   Jenika Chiem A. Sheffield Slider, MD, MS

## 2010-12-23 MED ORDER — SITAGLIPTIN PHOSPHATE 50 MG PO TABS: 50.0000 mg | ORAL_TABLET | Freq: Every day | ORAL | Status: DC

## 2011-01-12 ENCOUNTER — Telehealth: Payer: Self-pay | Admitting: Family Medicine

## 2011-01-12 ENCOUNTER — Encounter: Payer: Self-pay | Admitting: Family Medicine

## 2011-01-12 DIAGNOSIS — E1129 Type 2 diabetes mellitus with other diabetic kidney complication: Secondary | ICD-10-CM

## 2011-01-12 NOTE — Telephone Encounter (Signed)
Here are the blood sugar numbers since starting the Januvia. I'm taking one pill in the morning.  8/29 7:00 am 205 8/30 7:00 am 195 9/1 8:00 am 209 9/4 8:00 am 201 9/5 7:00am 191 9/6 10:00 am 204 9/10 11:30 am 261 9/11 9:00 am 193 9/13 8:00 am 194  The numbers are still high. Should I take 2 of the Januvia per day? Would taking it in the evening have any effect on the morning numbers? Something else I've noticed since our last visit, I have ringing in my ears. I don't know if it is related to the high blood sugar, but it seems to have started in the last few weeks. Please advise Tanner Mason (by email 01/11/11  Unfortunately that's the highest dose at the kidney function level on your last lab tests. The Creatinine measure of kidney had gotten significantly worse. Would you come in for a repeat lab test of your creatinine. It's likely that it is time to start insulin. You are not the typical obese type 2 diabetic who is very insulin resistant. I suspect you are underproducing insulin and will do very well with replacing it, maybe with one injection daily.  [place]

## 2011-01-14 ENCOUNTER — Encounter: Payer: Self-pay | Admitting: Family Medicine

## 2011-01-21 ENCOUNTER — Other Ambulatory Visit: Payer: Medicare Other

## 2011-01-21 DIAGNOSIS — E1129 Type 2 diabetes mellitus with other diabetic kidney complication: Secondary | ICD-10-CM

## 2011-01-21 LAB — BASIC METABOLIC PANEL
BUN: 29 mg/dL — ABNORMAL HIGH (ref 6–23)
CO2: 31 mEq/L (ref 19–32)
Calcium: 10.3 mg/dL (ref 8.4–10.5)
Chloride: 96 mEq/L (ref 96–112)
Creat: 1.96 mg/dL — ABNORMAL HIGH (ref 0.50–1.35)
Glucose, Bld: 247 mg/dL — ABNORMAL HIGH (ref 70–99)
Potassium: 3.4 mEq/L — ABNORMAL LOW (ref 3.5–5.3)
Sodium: 139 mEq/L (ref 135–145)

## 2011-01-21 LAB — C-PEPTIDE: C-Peptide: 7.3 ng/mL — ABNORMAL HIGH (ref 0.80–3.90)

## 2011-01-21 NOTE — Progress Notes (Signed)
Bmp and c peptide done today [facility] Manus Weedman

## 2011-01-29 ENCOUNTER — Encounter: Payer: Self-pay | Admitting: Family Medicine

## 2011-01-29 ENCOUNTER — Telehealth: Payer: Self-pay | Admitting: Family Medicine

## 2011-01-29 NOTE — Telephone Encounter (Signed)
I called to follow up on his labs. He reports tinnitus without change in hearing or vertigo. GERD symptoms and has to take omeprazole every third day despite taking the Ranitidine. Blood sugars still in the 200s fasting.  I asked him to stop the Ranitidine and start the Omeprazole daily.  He will come 02/07/11 at 8:30 to see Dr Raymondo Band to start insulin and I will also see him to get labs including UPEP and make referral for renal consult.

## 2011-01-29 NOTE — Progress Notes (Signed)
  Subjective:    Patient ID: Tanner Mason, male    DOB: [birth year], 70 y.o.   MRN: [id number]  HPI    Review of Systems     Objective:   Physical Exam        Assessment & Plan:

## 2011-02-01 ENCOUNTER — Other Ambulatory Visit: Payer: Self-pay | Admitting: Family Medicine

## 2011-02-01 NOTE — Telephone Encounter (Signed)
Refill request

## 2011-02-07 ENCOUNTER — Telehealth: Payer: Self-pay | Admitting: Family Medicine

## 2011-02-07 ENCOUNTER — Encounter: Payer: Self-pay | Admitting: Pharmacist

## 2011-02-07 ENCOUNTER — Ambulatory Visit (INDEPENDENT_AMBULATORY_CARE_PROVIDER_SITE_OTHER): Payer: Medicare Other | Admitting: Pharmacist

## 2011-02-07 VITALS — BP 144/81 | HR 78 | Ht 66.75 in | Wt 154.2 lb

## 2011-02-07 DIAGNOSIS — E1129 Type 2 diabetes mellitus with other diabetic kidney complication: Secondary | ICD-10-CM

## 2011-02-07 LAB — DIFFERENTIAL
Basophils Absolute: 0
Basophils Relative: 1
Eosinophils Absolute: 0.1
Eosinophils Relative: 3
Lymphocytes Relative: 35
Lymphs Abs: 1.1
Monocytes Absolute: 0.3
Monocytes Relative: 9
Neutro Abs: 1.6 — ABNORMAL LOW
Neutrophils Relative %: 54

## 2011-02-07 LAB — CBC
HCT: 32.2 — ABNORMAL LOW
Hemoglobin: 11.3 — ABNORMAL LOW
MCHC: 35.3
MCV: 95
Platelets: 159
RBC: 3.39 — ABNORMAL LOW
RDW: 13.8
WBC: 3.1 — ABNORMAL LOW

## 2011-02-07 LAB — BONE MARROW EXAM

## 2011-02-07 LAB — CHROMOSOME ANALYSIS, BONE MARROW

## 2011-02-07 MED ORDER — INSULIN GLARGINE 100 UNIT/ML ~~LOC~~ SOLN: 8.0000 [IU] | Freq: Every day | SUBCUTANEOUS | Status: DC

## 2011-02-07 MED ORDER — GLUCOSE BLOOD VI STRP: ORAL_STRIP | Status: DC

## 2011-02-07 NOTE — Assessment & Plan Note (Signed)
Lab Results  Component Value Date   HGBA1C 7.5 11/27/2010

## 2011-02-07 NOTE — Progress Notes (Signed)
Addended by: Kathrin Ruddy on: 02/07/2011 05:01 PM   Modules accepted: Orders

## 2011-02-07 NOTE — Telephone Encounter (Signed)
The patient called saying the pharmacy has not gotten the Rx for the pen.  He would like a call back either way as to when it is done.  He is currently at the pharmacy.

## 2011-02-07 NOTE — Patient Instructions (Signed)
Start Lantus insulin today. Start with 8 unit injection every morning. Increase the dose by 1 unit each day until your fasting blood glucose is 100-110. Follow up visit in Pharmacy Clinic in 4 weeks.

## 2011-02-07 NOTE — Telephone Encounter (Signed)
Tanner Mason is calling back because the pen is still not at the pharmacy.

## 2011-02-07 NOTE — Progress Notes (Signed)
  Subjective:    Patient ID: Tanner Mason, male    DOB: 1940/06/11, 70 y.o.   MRN: [id number]  HPI Patient in good spirit. Well dressed and groomed. Pt came in for insulin therapy initiation. Pt says his fasting BG was in the low 200's lately. He has been controlled well for 14 years, but recently his numbers have been 'creeping up'. Pt inquired if he still needs both of his oral medicines.   Review of Systems     Objective:   Physical Exam A1c = 7.5 (11/27/2010)       Assessment & Plan:   Diabetes of 14 yrs duration currently under good control of blood glucose based on  . Lab Results  Component Value Date   HGBA1C 7.5 11/27/2010     ,home fasting CBG readings of 200. Control is suboptimal due to need of insulin. Denies hypoglycemic events.  Able to verbalize appropriate hypoglycemia management plan. Started basal insulin Lantus (insulin glargine) 8 units. Patient will continue to titrate 1 unit/day if fasting CBGs > 100mg /dl until fasting CBGs reach goal or next visit.  Written patient instructions provided.  Follow up in  Pharmacist Clinic Visit  3-4 weeks.   TTFFC 30 minutes.  Patient seen with Adrian Blackwater, PharmD Candidate. Marland Kitchen

## 2011-02-11 NOTE — Progress Notes (Signed)
  Subjective:    Patient ID: Tanner Mason, male    DOB: 1941-01-02, 70 y.o.   MRN: [id number]  HPI  Reviewed and agree with Dr. Macky Lower management.     Review of Systems     Objective:   Physical Exam        Assessment & Plan:

## 2011-02-18 ENCOUNTER — Telehealth: Payer: Self-pay | Admitting: Family Medicine

## 2011-02-18 DIAGNOSIS — E1129 Type 2 diabetes mellitus with other diabetic kidney complication: Secondary | ICD-10-CM

## 2011-02-18 DIAGNOSIS — D649 Anemia, unspecified: Secondary | ICD-10-CM

## 2011-02-18 NOTE — Telephone Encounter (Signed)
Patient started Lantus Insulin 10/14.  He has checked BS daily at 7:30 AM fasting.  Readings range 263 to 305.  Has increased insulin by one unit daily and is currently up to 16 units as of this AM . Reading today fasting was 273.     he did check BS at 7:00 PM before dinner  on 10/14 with reading of 365 and on 10/15 at 11:30 PM with reading of 438.    will forward message to Dr. Sheffield Slider.

## 2011-02-18 NOTE — Telephone Encounter (Signed)
Pt was given insulin pens last week and now his sugar seems to be going up and not down.  Needs to talk to nurse

## 2011-02-19 ENCOUNTER — Other Ambulatory Visit: Payer: Self-pay | Admitting: Family Medicine

## 2011-02-19 NOTE — Telephone Encounter (Signed)
His blood sugars are increasing although he's up to 17 units of lantus daily. No symptoms of infection. I recommended he increase Lantus to 20 units daily then continue increasing by one unit daily. Continue the Januvia. Call if symptoms of infection. We'll get BMET next visit to Dr Raymondo Band and I'll consider renal consult if creatinine hasn't improved.

## 2011-03-01 ENCOUNTER — Encounter: Payer: Self-pay | Admitting: Pharmacist

## 2011-03-01 ENCOUNTER — Ambulatory Visit (INDEPENDENT_AMBULATORY_CARE_PROVIDER_SITE_OTHER): Payer: Medicare Other | Admitting: Pharmacist

## 2011-03-01 VITALS — BP 133/85 | HR 76 | Ht 67.5 in | Wt 158.2 lb

## 2011-03-01 DIAGNOSIS — D649 Anemia, unspecified: Secondary | ICD-10-CM

## 2011-03-01 DIAGNOSIS — E1129 Type 2 diabetes mellitus with other diabetic kidney complication: Secondary | ICD-10-CM

## 2011-03-01 LAB — CBC WITH DIFFERENTIAL/PLATELET
Basophils Absolute: 0 10*3/uL (ref 0.0–0.1)
Basophils Relative: 0 % (ref 0–1)
Eosinophils Absolute: 0.1 10*3/uL (ref 0.0–0.7)
Eosinophils Relative: 3 % (ref 0–5)
HCT: 33.8 % — ABNORMAL LOW (ref 39.0–52.0)
Hemoglobin: 11.1 g/dL — ABNORMAL LOW (ref 13.0–17.0)
Lymphocytes Relative: 33 % (ref 12–46)
Lymphs Abs: 1.1 10*3/uL (ref 0.7–4.0)
MCH: 31.8 pg (ref 26.0–34.0)
MCHC: 32.8 g/dL (ref 30.0–36.0)
MCV: 96.8 fL (ref 78.0–100.0)
Monocytes Absolute: 0.3 10*3/uL (ref 0.1–1.0)
Monocytes Relative: 8 % (ref 3–12)
Neutro Abs: 1.9 10*3/uL (ref 1.7–7.7)
Neutrophils Relative %: 56 % (ref 43–77)
Platelets: 122 10*3/uL — ABNORMAL LOW (ref 150–400)
RBC: 3.49 MIL/uL — ABNORMAL LOW (ref 4.22–5.81)
RDW: 15.1 % (ref 11.5–15.5)
WBC: 3.4 10*3/uL — ABNORMAL LOW (ref 4.0–10.5)

## 2011-03-01 LAB — SEDIMENTATION RATE: Sed Rate: 8 mm/hr (ref 0–16)

## 2011-03-01 LAB — COMPREHENSIVE METABOLIC PANEL
ALT: 40 U/L (ref 0–53)
AST: 38 U/L — ABNORMAL HIGH (ref 0–37)
Albumin: 4.4 g/dL (ref 3.5–5.2)
Alkaline Phosphatase: 56 U/L (ref 39–117)
BUN: 21 mg/dL (ref 6–23)
CO2: 26 mEq/L (ref 19–32)
Calcium: 9.2 mg/dL (ref 8.4–10.5)
Chloride: 101 mEq/L (ref 96–112)
Creat: 1.7 mg/dL — ABNORMAL HIGH (ref 0.50–1.35)
Glucose, Bld: 203 mg/dL — ABNORMAL HIGH (ref 70–99)
Potassium: 3.6 mEq/L (ref 3.5–5.3)
Sodium: 139 mEq/L (ref 135–145)
Total Bilirubin: 0.6 mg/dL (ref 0.3–1.2)
Total Protein: 6.8 g/dL (ref 6.0–8.3)

## 2011-03-01 NOTE — Patient Instructions (Addendum)
Increase Lantus (insulin glargine) to 32 units tomorrow morning unless blood sugar in the morning is <100. Continue increasing your dose of Lantus by one unit per day until your morning fasting blood sugars are about 100 (range: 90-120).  Please start checking your blood sugar about two hours after your largest meal at least one time per day.

## 2011-03-01 NOTE — Progress Notes (Signed)
  Subjective:    Patient ID: Tanner Mason, male    DOB: 06/20/1940, 70 y.o.   MRN: [id number]  HPI Pt arrives in good spirits with his glucometer and blood glucose log for follow-up appointment of this diabetes. Pt states that he has been increasing his insulin glargine by one unit each morning for elevated blood glucose readings as directed. Reports a blood glucose of 169 this morning for which he took 29 units of his insulin glargine. States that he has been taking glipizide 5mg , two tabs in the AM and one tab in the PM as well as his sitagliptin.   Upon reviewing pt's allergies, pt states he had an episode of tongue swelling on 02/22/2011 which started in the back side of his tongue and progressed overnight with a swollen/numb tongue by the next morning. Denies biting his tongue. Reports that a prior episode was couple months before the most recent episode.    Review of Systems     Objective:   Physical Exam BP 133/85  Pulse 76  Ht 5' 7.5" (1.715 m)  Wt 158 lb 3.2 oz (71.759 kg)  BMI 24.41 kg/m2  10/27 147 --> 23 units 10/28 146 --> 24 units 10/29 150 --> 25 units 10/30 142 --> 26 units 10/31 162 --> 27 units 11/1 164 --> 28 units 11/2 169 --> 29 units     Assessment & Plan:  Diabetes of ~14 yrs duration currently under improved control of blood glucose based on improving home fasting CBG readings of 142-169 compared to previous reading in the 200's. Goal fasting blood sugars of about 100 (range: 90-120). Lab Results  Component Value Date   HGBA1C 7.5 11/27/2010  Denies hypoglycemic events.  Able to verbalize appropriate hypoglycemia management plan. Increased dose of basal insulin Lantus (insulin glargine) to 32 units tomorrow morning unless AM BG is <100. Patient will continue to titrate 1 unit/day if fasting CBGs > 100mg /dl until fasting CBGs reach goal or next visit. Requested pt to start checking post-prandial BG's 2h after his largest meal at least 1x per day Written  patient instructions provided.  Follow up in  Pharmacist Clinic Visit 3 weeks.   TTFFC 35 minutes.    Patient seen with Marijo Conception, PharmD Candidate and Maudry Mayhew, Pharmacy Resident

## 2011-03-01 NOTE — Assessment & Plan Note (Signed)
Diabetes of ~14 yrs duration currently under improved control of blood glucose based on improving home fasting CBG readings of 142-169 compared to previous reading in the 200's. Goal fasting blood sugars of about 100 (range: 90-120). Lab Results  Component Value Date   HGBA1C 7.5 11/27/2010  Denies hypoglycemic events.  Able to verbalize appropriate hypoglycemia management plan. Increased dose of basal insulin Lantus (insulin glargine) to 32 units tomorrow morning unless AM BG is <100. Patient will continue to titrate 1 unit/day if fasting CBGs > 100mg /dl until fasting CBGs reach goal or next visit. Requested pt to start checking post-prandial BG's 2h after his largest meal at least 1x per day Written patient instructions provided.  Follow up in  Pharmacist Clinic Visit 3 weeks.   TTFFC 35 minutes.    Patient seen with Marijo Conception, PharmD Candidate and Maudry Mayhew, Pharmacy Resident

## 2011-03-01 NOTE — Progress Notes (Signed)
  Subjective:    Patient ID: Tanner Mason, male    DOB: 05/17/1940, 70 y.o.   MRN: [id number]  HPI  Reviewed and agree with Dr. Koval's management.     Review of Systems     Objective:   Physical Exam        Assessment & Plan:   

## 2011-03-05 ENCOUNTER — Other Ambulatory Visit: Payer: Self-pay | Admitting: Family Medicine

## 2011-03-05 DIAGNOSIS — N400 Enlarged prostate without lower urinary tract symptoms: Secondary | ICD-10-CM

## 2011-03-05 NOTE — Telephone Encounter (Signed)
Refill request

## 2011-03-08 ENCOUNTER — Encounter: Payer: Self-pay | Admitting: Family Medicine

## 2011-03-15 NOTE — Progress Notes (Signed)
This encounter was created in error - please disregard.

## 2011-04-05 ENCOUNTER — Ambulatory Visit (INDEPENDENT_AMBULATORY_CARE_PROVIDER_SITE_OTHER): Payer: Medicare Other | Admitting: Pharmacist

## 2011-04-05 ENCOUNTER — Encounter: Payer: Self-pay | Admitting: Pharmacist

## 2011-04-05 DIAGNOSIS — I10 Essential (primary) hypertension: Secondary | ICD-10-CM

## 2011-04-05 DIAGNOSIS — E1129 Type 2 diabetes mellitus with other diabetic kidney complication: Secondary | ICD-10-CM

## 2011-04-05 NOTE — Progress Notes (Signed)
  Subjective:    Patient ID: Tanner Mason, male    DOB: 02/24/1941, 70 y.o.   MRN: [id number]  HPI  Reviewed and agree with Dr. Koval's management.     Review of Systems     Objective:   Physical Exam        Assessment & Plan:   

## 2011-04-05 NOTE — Assessment & Plan Note (Signed)
BP remains greater than goal of <130 / 80 despite HCTZ and doxazosin.  Given potaium values of < 4.0 and Scr < 2.0 may consider use of low dose spironolactone in combination with lower dose of HCTZ to improve BP and potentially reduce proteinuria.   Next visit with Dr. Sheffield Slider.

## 2011-04-05 NOTE — Progress Notes (Signed)
  Subjective:    Patient ID: Tanner Mason, male    DOB: [birth year], 70 y.o.   MRN: [id number]  HPI Patient arrives in good spirits.   He reports increasing his Lantus dose to 35 units QAM.   He denies any symptomatic episodes of low blood sugar.   Review of his blood glucose log book reveals two readings < 80.   AM fastings are routinely 80-135 Random 2 hour Post Prandial CBGs were 130-160 with no reading > 200.      Review of Systems     Objective:   Physical Exam        Assessment & Plan:   Diabetes of 14 yrs duration currently under excellent control of blood glucose based on  . Lab Results  Component Value Date   HGBA1C 7.5 11/27/2010     ,home fasting CBG readings and random CBG readings.  Denies symptomatic hypoglycemic events.  Able to verbalize appropriate hypoglycemia management plan. Continued basal insulin Lantus (insulin glargine) at 35 unit QAM however patient was instructed to titrate down to 32 units QAM if symptomatic hypoglycemia OR multiple readings of < 80 occur.  Written patient instructions provided.  Follow up with Dr. Sheffield Slider in January.    Total time in face to face counseling 25 minutes.  Patient seen with Orland Jarred, Medical student.

## 2011-04-05 NOTE — Assessment & Plan Note (Signed)
  Diabetes of 14 yrs duration currently under excellent control of blood glucose based on  . Lab Results  Component Value Date   HGBA1C 7.5 11/27/2010     ,home fasting CBG readings and random CBG readings.  Denies symptomatic hypoglycemic events.  Able to verbalize appropriate hypoglycemia management plan. Continued basal insulin Lantus (insulin glargine) at 35 unit QAM however patient was instructed to titrate down to 32 units QAM if symptomatic hypoglycemia OR multiple readings of < 80 occur.  Written patient instructions provided.  Follow up with Dr. Sheffield Slider in January.    Total time in face to face counseling 25 minutes.  Patient seen with Orland Jarred, Medical student.

## 2011-04-05 NOTE — Patient Instructions (Signed)
Continue with 35 unit of Lantus each morning.   Reduce to 32 units if you plan to be busy or have more than one reading less than 80 in a week.  Next visit with Dr. Sheffield Slider January.

## 2011-04-19 ENCOUNTER — Other Ambulatory Visit: Payer: Self-pay | Admitting: Family Medicine

## 2011-04-19 NOTE — Telephone Encounter (Signed)
Refill request

## 2011-04-26 ENCOUNTER — Encounter: Payer: Self-pay | Admitting: Family Medicine

## 2011-05-05 ENCOUNTER — Other Ambulatory Visit: Payer: Self-pay | Admitting: Family Medicine

## 2011-05-05 DIAGNOSIS — K219 Gastro-esophageal reflux disease without esophagitis: Secondary | ICD-10-CM

## 2011-05-05 NOTE — Telephone Encounter (Signed)
Refill request

## 2011-05-14 ENCOUNTER — Encounter: Payer: Self-pay | Admitting: Family Medicine

## 2011-05-14 ENCOUNTER — Ambulatory Visit (INDEPENDENT_AMBULATORY_CARE_PROVIDER_SITE_OTHER): Payer: Medicare Other | Admitting: Family Medicine

## 2011-05-14 VITALS — BP 127/74 | HR 73 | Ht 65.0 in | Wt 166.0 lb

## 2011-05-14 DIAGNOSIS — M722 Plantar fascial fibromatosis: Secondary | ICD-10-CM | POA: Insufficient documentation

## 2011-05-14 DIAGNOSIS — E785 Hyperlipidemia, unspecified: Secondary | ICD-10-CM

## 2011-05-14 DIAGNOSIS — E1129 Type 2 diabetes mellitus with other diabetic kidney complication: Secondary | ICD-10-CM

## 2011-05-14 DIAGNOSIS — R3915 Urgency of urination: Secondary | ICD-10-CM | POA: Insufficient documentation

## 2011-05-14 DIAGNOSIS — I10 Essential (primary) hypertension: Secondary | ICD-10-CM

## 2011-05-14 DIAGNOSIS — M79609 Pain in unspecified limb: Secondary | ICD-10-CM

## 2011-05-14 DIAGNOSIS — M79671 Pain in right foot: Secondary | ICD-10-CM

## 2011-05-14 LAB — POCT URINALYSIS DIPSTICK
Bilirubin, UA: NEGATIVE
Blood, UA: NEGATIVE
Glucose, UA: 500
Ketones, UA: NEGATIVE
Leukocytes, UA: NEGATIVE
Nitrite, UA: NEGATIVE
Protein, UA: NEGATIVE
Spec Grav, UA: <=1.005
Urobilinogen, UA: 0.2
pH, UA: 6

## 2011-05-14 LAB — BASIC METABOLIC PANEL
BUN: 29 mg/dL — ABNORMAL HIGH (ref 6–23)
CO2: 29 mEq/L (ref 19–32)
Calcium: 8.9 mg/dL (ref 8.4–10.5)
Chloride: 97 mEq/L (ref 96–112)
Creat: 1.99 mg/dL — ABNORMAL HIGH (ref 0.50–1.35)
Glucose, Bld: 259 mg/dL — ABNORMAL HIGH (ref 70–99)
Potassium: 3.3 mEq/L — ABNORMAL LOW (ref 3.5–5.3)
Sodium: 138 mEq/L (ref 135–145)

## 2011-05-14 LAB — LDL CHOLESTEROL, DIRECT: Direct LDL: 37 mg/dL

## 2011-05-14 LAB — POCT GLYCOSYLATED HEMOGLOBIN (HGB A1C): Hemoglobin A1C: 6.9

## 2011-05-14 LAB — URIC ACID: Uric Acid, Serum: 8.5 mg/dL — ABNORMAL HIGH (ref 4.0–7.8)

## 2011-05-14 NOTE — Assessment & Plan Note (Signed)
Urinalysis doesn't suggest infection. Consider short acting anti-cholinergic with increased Cardura.

## 2011-05-14 NOTE — Assessment & Plan Note (Signed)
Check direct LDL 

## 2011-05-14 NOTE — Assessment & Plan Note (Signed)
Now well controlled, but gaining weight. No hypoglycemia symptoms

## 2011-05-14 NOTE — Assessment & Plan Note (Signed)
Uric acid to rule out gout. May also have early plantar fasciitis

## 2011-05-14 NOTE — Patient Instructions (Signed)
I will call with the lab results before you leave on Sat.   We'll discuss the foot pain and urgency then.  Please return to see Dr Sheffield Slider in 3months.

## 2011-05-14 NOTE — Progress Notes (Signed)
  Subjective:    Patient ID: Tanner Mason, male    DOB: [birth year], 71 y.o.   MRN: [id number]  HPI I visited the grandchildren in [place] over the holidays. They will travel to [place] from February 19 through the 30th.  He is happy that his blood sugars are much improved. Wonders if he can stop taking either the glipizide or Januvia.  His glucose diary shows values all in the mid 100s  Continues having nocturia x2 despite taking the Cardura. Recently he has had some urgency which occurs if he doesn't empty his bladder before every 2 hours. He denies burning on urination.  Over the past week he's had pain in the right metatarsal head area without any trauma. Also notes some pain in the heels particularly while on first awakening.     Review of Systems     Objective:   Physical Exam  Constitutional: He appears well-developed and well-nourished.       Increased abdominal fat  Cardiovascular: Normal rate, regular rhythm and normal heart sounds.   Pulmonary/Chest: Effort normal and breath sounds normal.  Musculoskeletal: He exhibits no edema.       Feet normal pulses and filament. No callouses.  Sl warm and tender over right 5th metatarsal head.   Neurological: He is alert.  Psychiatric: He has a normal mood and affect. His behavior is normal. Judgment and thought content normal.          Assessment & Plan:

## 2011-05-14 NOTE — Assessment & Plan Note (Signed)
well controlled on repeat test

## 2011-05-16 ENCOUNTER — Encounter: Payer: Self-pay | Admitting: Family Medicine

## 2011-07-04 ENCOUNTER — Other Ambulatory Visit: Payer: Self-pay | Admitting: Family Medicine

## 2011-07-04 DIAGNOSIS — E789 Disorder of lipoprotein metabolism, unspecified: Secondary | ICD-10-CM

## 2011-07-04 DIAGNOSIS — I10 Essential (primary) hypertension: Secondary | ICD-10-CM

## 2011-07-04 NOTE — Telephone Encounter (Signed)
Refill request

## 2011-08-20 ENCOUNTER — Ambulatory Visit (INDEPENDENT_AMBULATORY_CARE_PROVIDER_SITE_OTHER): Payer: Medicare Other | Admitting: Family Medicine

## 2011-08-20 ENCOUNTER — Encounter: Payer: Self-pay | Admitting: Family Medicine

## 2011-08-20 VITALS — BP 129/76 | HR 80 | Ht 67.0 in | Wt 168.0 lb

## 2011-08-20 DIAGNOSIS — N058 Unspecified nephritic syndrome with other morphologic changes: Secondary | ICD-10-CM

## 2011-08-20 DIAGNOSIS — N400 Enlarged prostate without lower urinary tract symptoms: Secondary | ICD-10-CM

## 2011-08-20 DIAGNOSIS — E1129 Type 2 diabetes mellitus with other diabetic kidney complication: Secondary | ICD-10-CM

## 2011-08-20 DIAGNOSIS — M79609 Pain in unspecified limb: Secondary | ICD-10-CM

## 2011-08-20 DIAGNOSIS — N183 Chronic kidney disease, stage 3 unspecified: Secondary | ICD-10-CM

## 2011-08-20 DIAGNOSIS — E1122 Type 2 diabetes mellitus with diabetic chronic kidney disease: Secondary | ICD-10-CM

## 2011-08-20 DIAGNOSIS — I10 Essential (primary) hypertension: Secondary | ICD-10-CM

## 2011-08-20 DIAGNOSIS — M79671 Pain in right foot: Secondary | ICD-10-CM

## 2011-08-20 LAB — POCT GLYCOSYLATED HEMOGLOBIN (HGB A1C): Hemoglobin A1C: 6.7

## 2011-08-20 MED ORDER — "PEN NEEDLES 3/16"" 31G X 5 MM MISC": 1.0000 | Status: DC

## 2011-08-20 MED ORDER — FINASTERIDE 5 MG PO TABS: 5.0000 mg | ORAL_TABLET | Freq: Every day | ORAL | Status: DC

## 2011-08-20 MED ORDER — IBUPROFEN 200 MG PO TABS: 600.0000 mg | ORAL_TABLET | Freq: Three times a day (TID) | ORAL | Status: AC

## 2011-08-20 NOTE — Progress Notes (Signed)
  Subjective:    Patient ID: Tanner Mason, male    DOB: Sep 17, 1940, 71 y.o.   MRN: [id number]  HPI Check his generally been doing well but rhythm is recently bothered by increasing pain in his heels particularly the right. Is worse when he first gets up in the morning or after he sits for a while. He can be severe he has walk on his tiptoes.   His blood sugars will run up to 150 or 160 at time. No hypoglycemic symptoms. No chest pain or shortness of breath  Disappointed that he has gained weight.   He continues nocturia x 2. Denies cutting off or urgency of urination. Review of Systems     Objective:   Physical Exam  Constitutional: He appears well-developed and well-nourished.  Cardiovascular: Normal rate and regular rhythm.   Pulmonary/Chest: Effort normal and breath sounds normal.  Abdominal: He exhibits no distension. There is no tenderness.  Musculoskeletal:       Exquisitely tender anterior medial surface of calcaneus. Right greater than left   Neurological: He is alert.  Psychiatric: He has a normal mood and affect. His behavior is normal. Thought content normal.          Assessment & Plan:

## 2011-08-20 NOTE — Assessment & Plan Note (Signed)
Short course of NSAID plus stretching

## 2011-08-20 NOTE — Assessment & Plan Note (Signed)
Creatinine next visit

## 2011-08-20 NOTE — Assessment & Plan Note (Signed)
Symptoms stable. We discussed adding Finesteride. He's not concerned about possible adverse effects on potency.

## 2011-08-20 NOTE — Assessment & Plan Note (Signed)
well controlled  

## 2011-08-20 NOTE — Patient Instructions (Addendum)
Take Ibuprofen 3 tabs of 200 mg three times daily with meals for 5 days. Thereafter Acetaminophen 500 mg 2 tabs 3 times daily there after as needed for pain.   Please return to see Dr Sheffield Slider in 3months.      Plantar Fasciitis (Heel Spur Syndrome) with Rehab The plantar fascia is a fibrous, ligament-like, soft-tissue structure that spans the bottom of the foot. Plantar fasciitis is a condition that causes pain in the foot due to inflammation of the tissue. SYMPTOMS   Pain and tenderness on the underneath side of the foot.   Pain that worsens with standing or walking.  CAUSES  Plantar fasciitis is caused by irritation and injury to the plantar fascia on the underneath side of the foot. Common mechanisms of injury include:  Direct trauma to bottom of the foot.   Damage to a small nerve that runs under the foot where the main fascia attaches to the heel bone.   Stress placed on the plantar fascia due to bone spurs.  RISK INCREASES WITH:   Activities that place stress on the plantar fascia (running, jumping, pivoting, or cutting).   Poor strength and flexibility.   Improperly fitted shoes.   Tight calf muscles.   Flat feet.   Failure to warm-up properly before activity.   Obesity.  PREVENTION  Warm up and stretch properly before activity.   Allow for adequate recovery between workouts.   Maintain physical fitness:   Strength, flexibility, and endurance.   Cardiovascular fitness.   Maintain a health body weight.   Avoid stress on the plantar fascia.   Wear properly fitted shoes, including arch supports for individuals who have flat feet.  PROGNOSIS  If treated properly, then the symptoms of plantar fasciitis usually resolve without surgery. However, occasionally surgery is necessary. RELATED COMPLICATIONS   Recurrent symptoms that may result in a chronic condition.   Problems of the lower back that are caused by compensating for the injury, such as limping.    Pain or weakness of the foot during push-off following surgery.   Chronic inflammation, scarring, and partial or complete fascia tear, occurring more often from repeated injections.  TREATMENT  Treatment initially involves the use of ice and medication to help reduce pain and inflammation. The use of strengthening and stretching exercises may help reduce pain with activity, especially stretches of the Achilles tendon. These exercises may be performed at home or with a therapist. Your caregiver may recommend that you use heel cups of arch supports to help reduce stress on the plantar fascia. Occasionally, corticosteroid injections are given to reduce inflammation. If symptoms persist for greater than 6 months despite non-surgical (conservative), then surgery may be recommended.  MEDICATION   If pain medication is necessary, then nonsteroidal anti-inflammatory medications, such as aspirin and ibuprofen, or other minor pain relievers, such as acetaminophen, are often recommended.   Do not take pain medication within 7 days before surgery.   Prescription pain relievers may be given if deemed necessary by your caregiver. Use only as directed and only as much as you need.   Corticosteroid injections may be given by your caregiver. These injections should be reserved for the most serious cases, because they may only be given a certain number of times.  HEAT AND COLD  Cold treatment (icing) relieves pain and reduces inflammation. Cold treatment should be applied for 10 to 15 minutes every 2 to 3 hours for inflammation and pain and immediately after any activity that aggravates your symptoms.  Use ice packs or massage the area with a piece of ice (ice massage).   Heat treatment may be used prior to performing the stretching and strengthening activities prescribed by your caregiver, physical therapist, or athletic trainer. Use a heat pack or soak the injury in warm water.  SEEK IMMEDIATE MEDICAL CARE  IF:  Treatment seems to offer no benefit, or the condition worsens.   Any medications produce adverse side effects.  EXERCISES RANGE OF MOTION (ROM) AND STRETCHING EXERCISES - Plantar Fasciitis (Heel Spur Syndrome) These exercises may help you when beginning to rehabilitate your injury. Your symptoms may resolve with or without further involvement from your physician, physical therapist or athletic trainer. While completing these exercises, remember:   Restoring tissue flexibility helps normal motion to return to the joints. This allows healthier, less painful movement and activity.   An effective stretch should be held for at least 30 seconds.   A stretch should never be painful. You should only feel a gentle lengthening or release in the stretched tissue.  RANGE OF MOTION - Toe Extension, Flexion  Sit with your right / left leg crossed over your opposite knee.   Grasp your toes and gently pull them back toward the top of your foot. You should feel a stretch on the bottom of your toes and/or foot.   Hold this stretch for __________ seconds.   Now, gently pull your toes toward the bottom of your foot. You should feel a stretch on the top of your toes and or foot.   Hold this stretch for __________ seconds.  Repeat __________ times. Complete this stretch __________ times per day.  RANGE OF MOTION - Ankle Dorsiflexion, Active Assisted  Remove shoes and sit on a chair that is preferably not on a carpeted surface.   Place right / left foot under knee. Extend your opposite leg for support.   Keeping your heel down, slide your right / left foot back toward the chair until you feel a stretch at your ankle or calf. If you do not feel a stretch, slide your bottom forward to the edge of the chair, while still keeping your heel down.   Hold this stretch for __________ seconds.  Repeat __________ times. Complete this stretch __________ times per day.  STRETCH - Gastroc, Standing  Place  hands on wall.   Extend right / left leg, keeping the front knee somewhat bent.   Slightly point your toes inward on your back foot.   Keeping your right / left heel on the floor and your knee straight, shift your weight toward the wall, not allowing your back to arch.   You should feel a gentle stretch in the right / left calf. Hold this position for __________ seconds.  Repeat __________ times. Complete this stretch __________ times per day. STRETCH - Soleus, Standing  Place hands on wall.   Extend right / left leg, keeping the other knee somewhat bent.   Slightly point your toes inward on your back foot.   Keep your right / left heel on the floor, bend your back knee, and slightly shift your weight over the back leg so that you feel a gentle stretch deep in your back calf.   Hold this position for __________ seconds.  Repeat __________ times. Complete this stretch __________ times per day. STRETCH - Gastrocsoleus, Standing  Note: This exercise can place a lot of stress on your foot and ankle. Please complete this exercise only if specifically instructed by your  caregiver.   Place the ball of your right / left foot on a step, keeping your other foot firmly on the same step.   Hold on to the wall or a rail for balance.   Slowly lift your other foot, allowing your body weight to press your heel down over the edge of the step.   You should feel a stretch in your right / left calf.   Hold this position for __________ seconds.   Repeat this exercise with a slight bend in your right / left knee.  Repeat __________ times. Complete this stretch __________ times per day.  STRENGTHENING EXERCISES - Plantar Fasciitis (Heel Spur Syndrome)  These exercises may help you when beginning to rehabilitate your injury. They may resolve your symptoms with or without further involvement from your physician, physical therapist or athletic trainer. While completing these exercises, remember:    Muscles can gain both the endurance and the strength needed for everyday activities through controlled exercises.   Complete these exercises as instructed by your physician, physical therapist or athletic trainer. Progress the resistance and repetitions only as guided.  STRENGTH - Towel Curls  Sit in a chair positioned on a non-carpeted surface.   Place your foot on a towel, keeping your heel on the floor.   Pull the towel toward your heel by only curling your toes. Keep your heel on the floor.   If instructed by your physician, physical therapist or athletic trainer, add ____________________ at the end of the towel.  Repeat __________ times. Complete this exercise __________ times per day. STRENGTH - Ankle Inversion  Secure one end of a rubber exercise band/tubing to a fixed object (table, pole). Loop the other end around your foot just before your toes.   Place your fists between your knees. This will focus your strengthening at your ankle.   Slowly, pull your big toe up and in, making sure the band/tubing is positioned to resist the entire motion.   Hold this position for __________ seconds.   Have your muscles resist the band/tubing as it slowly pulls your foot back to the starting position.  Repeat __________ times. Complete this exercises __________ times per day.  Document Released: 04/15/2005 Document Revised: 04/04/2011 Document Reviewed: 07/28/2008 [facility] Patient Information 2012 [place], [place].

## 2011-10-07 ENCOUNTER — Other Ambulatory Visit: Payer: Self-pay | Admitting: Family Medicine

## 2011-11-18 ENCOUNTER — Other Ambulatory Visit: Payer: Self-pay | Admitting: Family Medicine

## 2011-11-26 ENCOUNTER — Encounter: Payer: Self-pay | Admitting: Family Medicine

## 2011-11-26 ENCOUNTER — Ambulatory Visit (INDEPENDENT_AMBULATORY_CARE_PROVIDER_SITE_OTHER): Payer: Medicare Other | Admitting: Family Medicine

## 2011-11-26 ENCOUNTER — Other Ambulatory Visit: Payer: Self-pay | Admitting: Family Medicine

## 2011-11-26 VITALS — BP 118/77 | HR 72 | Ht 67.0 in | Wt 165.5 lb

## 2011-11-26 DIAGNOSIS — N183 Chronic kidney disease, stage 3 unspecified: Secondary | ICD-10-CM

## 2011-11-26 DIAGNOSIS — G609 Hereditary and idiopathic neuropathy, unspecified: Secondary | ICD-10-CM

## 2011-11-26 DIAGNOSIS — E1122 Type 2 diabetes mellitus with diabetic chronic kidney disease: Secondary | ICD-10-CM

## 2011-11-26 DIAGNOSIS — N058 Unspecified nephritic syndrome with other morphologic changes: Secondary | ICD-10-CM

## 2011-11-26 DIAGNOSIS — N401 Enlarged prostate with lower urinary tract symptoms: Secondary | ICD-10-CM

## 2011-11-26 DIAGNOSIS — E119 Type 2 diabetes mellitus without complications: Secondary | ICD-10-CM

## 2011-11-26 DIAGNOSIS — I10 Essential (primary) hypertension: Secondary | ICD-10-CM

## 2011-11-26 DIAGNOSIS — E1129 Type 2 diabetes mellitus with other diabetic kidney complication: Secondary | ICD-10-CM

## 2011-11-26 LAB — COMPREHENSIVE METABOLIC PANEL
ALT: 44 U/L (ref 0–53)
AST: 32 U/L (ref 0–37)
Albumin: 4.5 g/dL (ref 3.5–5.2)
Alkaline Phosphatase: 54 U/L (ref 39–117)
BUN: 29 mg/dL — ABNORMAL HIGH (ref 6–23)
CO2: 28 mEq/L (ref 19–32)
Calcium: 9.6 mg/dL (ref 8.4–10.5)
Chloride: 100 mEq/L (ref 96–112)
Creat: 2.01 mg/dL — ABNORMAL HIGH (ref 0.50–1.35)
Glucose, Bld: 153 mg/dL — ABNORMAL HIGH (ref 70–99)
Potassium: 4.1 mEq/L (ref 3.5–5.3)
Sodium: 140 mEq/L (ref 135–145)
Total Bilirubin: 0.8 mg/dL (ref 0.3–1.2)
Total Protein: 7.1 g/dL (ref 6.0–8.3)

## 2011-11-26 LAB — POCT GLYCOSYLATED HEMOGLOBIN (HGB A1C): Hemoglobin A1C: 6.7

## 2011-11-26 MED ORDER — TAMSULOSIN HCL 0.4 MG PO CAPS: 0.4000 mg | ORAL_CAPSULE | Freq: Every day | ORAL | Status: DC

## 2011-11-26 MED ORDER — GABAPENTIN 600 MG PO TABS: 300.0000 mg | ORAL_TABLET | Freq: Three times a day (TID) | ORAL | Status: DC

## 2011-11-26 NOTE — Progress Notes (Signed)
  Subjective:    Patient ID: Tanner Mason, male    DOB: 1941-03-31, 71 y.o.   MRN: [id number]  HPI Diabetes - cbg's have been good. No hypoglycemia  Peripheral neuropathy, plantar fasciitis - foot discomfort 4/10 in heels. Takes occasional Ibuprofen.   BPH - Nocturia x 2-3. Doxazosin no longer carried by his PBM, needs to change.   Angioedema - slight tongue swelling at times, but seems to be better on the daily Loratadine.    Review of Systems     Objective:   Physical Exam  Cardiovascular: Normal rate and regular rhythm.   Pulmonary/Chest: Effort normal and breath sounds normal.  Musculoskeletal: He exhibits no edema.       Feet without lesions          Assessment & Plan:

## 2011-11-26 NOTE — Patient Instructions (Addendum)
I will send your lab results, and call if any changes are needed  Congratulations on your diabetes  Please return to see Dr Sheffield Slider in 3months.

## 2011-11-29 ENCOUNTER — Other Ambulatory Visit: Payer: Self-pay | Admitting: Family Medicine

## 2011-11-29 NOTE — Assessment & Plan Note (Signed)
Since Doxazosin is no longer available to him, will replace it with Flomax and observe effects on his blood pressure

## 2011-11-29 NOTE — Assessment & Plan Note (Signed)
well controlled. Will see effects of changing from Doxazosin to Flomax

## 2011-11-29 NOTE — Assessment & Plan Note (Signed)
well controlled  

## 2011-12-05 ENCOUNTER — Encounter: Payer: Self-pay | Admitting: Family Medicine

## 2011-12-05 NOTE — Addendum Note (Signed)
Addended by: Zachery Dauer on: 12/05/2011 02:39 PM   Modules accepted: Orders

## 2011-12-11 ENCOUNTER — Telehealth: Payer: Self-pay | Admitting: Family Medicine

## 2011-12-11 NOTE — Telephone Encounter (Signed)
I spoke with Tanner Mason by phone. He hasn't been called yet for the renal consult so we will check to make sure the referral has been completed.

## 2011-12-19 ENCOUNTER — Telehealth: Payer: Self-pay | Admitting: Family Medicine

## 2011-12-19 NOTE — Telephone Encounter (Signed)
Patient is checking in on when the appt with the Nephrologist will be.

## 2011-12-19 NOTE — Telephone Encounter (Signed)
LMOM at [id number] appt will be sched for pt

## 2011-12-23 ENCOUNTER — Encounter: Payer: Self-pay | Admitting: Home Health Services

## 2011-12-24 ENCOUNTER — Telehealth: Payer: Self-pay | Admitting: *Deleted

## 2011-12-24 NOTE — Telephone Encounter (Signed)
Tanner Mason at [facility] calling to confirm that she received paperwork from our office requesting appt.  Patient has been "rated and is not considered to be urgent."  They will contact patient for an appt and call our office back later with appt info.  Tanner Brooks, RN

## 2011-12-24 NOTE — Telephone Encounter (Signed)
TC from [place] are reviewing records-feel it is non-urgent -will call us w/appt info.

## 2012-01-16 ENCOUNTER — Encounter: Payer: Self-pay | Admitting: Family Medicine

## 2012-01-17 ENCOUNTER — Other Ambulatory Visit: Payer: Self-pay | Admitting: Family Medicine

## 2012-01-21 ENCOUNTER — Ambulatory Visit (INDEPENDENT_AMBULATORY_CARE_PROVIDER_SITE_OTHER): Payer: Medicare Other | Admitting: Home Health Services

## 2012-01-21 ENCOUNTER — Encounter: Payer: Self-pay | Admitting: Home Health Services

## 2012-01-21 VITALS — BP 172/90 | HR 65 | Temp 97.4°F | Ht 67.0 in | Wt 163.5 lb

## 2012-01-21 DIAGNOSIS — Z23 Encounter for immunization: Secondary | ICD-10-CM

## 2012-01-21 DIAGNOSIS — Z0000 Encounter for general adult medical examination without abnormal findings: Secondary | ICD-10-CM

## 2012-01-21 NOTE — Progress Notes (Signed)
Patient here for annual wellness visit, patient reports: Risk Factors/Conditions needing evaluation or treatment: Pt does not have any new risk factors that need evaluation. Home Safety: Pt reports living with wife in 1 story home.  Pt reports having smoke detectors. Other Information: Corrective lens: Pt wears daily corrective lens.  Has annual eye exams. Dentures: Pt does not have dentures. Has annual dental exams. Memory: Pt denies memory problems.  Patient's Mini Mental Score (recorded in doc. flowsheet): 30  Balance/Gait: Pt reports pain in heels of feet.  Balance Abnormal Patient value  Sitting balance    Sit to stand    Attempts to arise    Immediate standing balance    Standing balance    Nudge    Eyes closed- Romberg x dizzy  Tandem stance    Back lean    Neck Rotation    360 degree turn    Sitting down     Gait Abnormal Patient value  Initiation of gait    Step length-left    Step length-right    Step height-left    Step height-right    Step symmetry    Step continuity    Path deviation    Trunk movement x   Walking stance x       Annual Wellness Visit Requirements Recorded Today In  Medical, family, social history Past Medical, Family, Social History Section  Current providers Care team  Current medications Medications  Wt, BP, Ht, BMI Vital signs  Tobacco, alcohol, illicit drug use History  ADL Nurse Assessment  Depression Screening Nurse Assessment  Cognitive impairment Nurse Assessment  Mini Mental Status Document Flowsheet  Fall Risk Nurse Assessment  Home Safety Progress Note  End of Life Planning (welcome visit) Social Documentation  Medicare preventative services Progress Note  Risk factors/conditions needing evaluation/treatment Progress Note  Personalized health advice Patient Instructions, goals, letter  Diet & Exercise Social Documentation  Emergency Contact Social Documentation  Seat Belts Social Documentation  Sun exposure/protection  Social Documentation    Medicare Prevention Plan:   Recommended Medicare Prevention Screenings Men over 65 Test For Frequency Date of Last- BOLD if needed  Colorectal Cancer 1-10 yrs 4/09  Prostate Cancer Never or yearly Under care   Aortic Aneurysm Once if 65-75 with hx of smoking Pt reports done  Cholesterol 5 yrs 1/13  Diabetes yearly 7/13  HIV yearly declined  Influenza Shot yearly 9/13  Pneumonia Shot once 12/10  Zostavax Shot once 8/11

## 2012-01-21 NOTE — Patient Instructions (Signed)
1. Continue to exercise at [facility] 3 times a week. 2. Continue to monitor daily sweets/carbohydrate intake. 3. Continue to monitor blood sugar and keeping record. 4. Start taking blood pressure several times a week and keep a record.  If it is consistently higher than 140/90 than please schedule an appointment with Dr. Sheffield Slider.

## 2012-01-22 ENCOUNTER — Telehealth: Payer: Self-pay | Admitting: Family Medicine

## 2012-01-22 ENCOUNTER — Encounter: Payer: Self-pay | Admitting: Home Health Services

## 2012-01-22 NOTE — Telephone Encounter (Signed)
I called Tanner Mason and he is continuing plantar fasciitis type despite the stretching exercises. It would be most efficient for this to be evaluated at Sports Medicine, in case an orthotic is indicated.   His renal evaluation has been determined non-urgent, but they will be scheduling his evaluation.

## 2012-01-22 NOTE — Progress Notes (Signed)
Patient ID: Tanner Mason, male   DOB: 1940-10-22, 71 y.o.   MRN: [id number] I have reviewed this visit and discussed with [name] and agree with her documentation.

## 2012-01-23 ENCOUNTER — Other Ambulatory Visit: Payer: Self-pay | Admitting: *Deleted

## 2012-01-23 DIAGNOSIS — M79673 Pain in unspecified foot: Secondary | ICD-10-CM

## 2012-02-18 ENCOUNTER — Ambulatory Visit (INDEPENDENT_AMBULATORY_CARE_PROVIDER_SITE_OTHER): Payer: Medicare Other | Admitting: Sports Medicine

## 2012-02-18 VITALS — BP 142/80 | Ht 67.0 in | Wt 165.0 lb

## 2012-02-18 DIAGNOSIS — M722 Plantar fascial fibromatosis: Secondary | ICD-10-CM

## 2012-02-18 NOTE — Progress Notes (Signed)
Patient ID: [name], male   DOB: February 14, 1941, 71 y.o.   MRN: [id number] SUBJECTIVE: Tanner Mason is a 71 y.o. male who reports onset of right foot in late June with onset of left foot pain in mid-July.  Denies any specific injury or event onset.  [place].  Patient reports early onset of pain in midfoot with pain onset proximal mid-foot that then migrated to distal, medial portion of calcaneus.  Pt reports resolution of pain with rest but onset of pain when walking.  Pt reports first few steps are most painful, both early in morning and after prolonged sitting at work.   First reported pain to Dr. Sheffield Slider approx 3 months ago.  Pt reports that since that time he has been doing exercises he found at a plantar fasciitis clinic at late August / early September.  Pt has been doing exercises on/off since then with some relief. Pt has used some ibuprofen with some relief.  However, pt has been hesitant b/c of elevated Creatine but when using it does provide relief.  Pt has been using ice prn with some relief (in evenings)   PMHx: Diabetes  Peripheral neuropathy GERD HTN HLD  Social: 2 beers once per week Non-smoker, no smokeless tobacco Retired  OBJECTIVE: Vital signs as noted above. Appearance:Well appearing male in no acute distress. Foot/ankle exam:  No gross abnormalities on inspection to include erythema, edema.   Pes cavus bilaterally when seated, persists when standing Transverse arch of bilateral feet collapse when standing  AROM is symmetric and appropriate in dorsiflexion, plantar flexion, eversion/inversion 5/5 strength in the same movements above.  No pain with any of those movements. No tenderness to palpation of navicular, malleoli, cuboid bilateraly.  Mild tenderness to palpation of medial surface of calcaneus; no tenderness to compression. Negative talar tilt, anterior drawer.  Negative thompson test.     Labs/Imaging: No new labs or imaging  ASSESSMENT: #1 - Bilateral Foot Pain secondary to plantar fascitis #2 - Pes cavus feet #3 - Diabetes with diabetic neuropathy   PLAN: Pt with history and exam to suggest plantar fasciitis as source of bilateral foot pain.  He appears to be improving currently.  We added plantar fascia stretching and eccentric heel drop exercises to expedite recovery.  Recommended daily ice bath of bilateral feet for 10 minutes.  Green sport inserts and navicular cushion provided for increased support.  The above measures were used to expedite recovery; pt with f/u in 3-4 weeks to reassess response to treatment and continued improvement. Instructed to avoid NSAIDS given baseline Cr elevation and upcoming nephrology referral.  Diabetes well controlled per chart review.  -2LT Kenney Houseman, MS IV

## 2012-02-19 ENCOUNTER — Other Ambulatory Visit: Payer: Self-pay | Admitting: Family Medicine

## 2012-02-20 ENCOUNTER — Other Ambulatory Visit: Payer: Self-pay | Admitting: Nephrology

## 2012-02-20 DIAGNOSIS — N183 Chronic kidney disease, stage 3 unspecified: Secondary | ICD-10-CM

## 2012-02-21 ENCOUNTER — Other Ambulatory Visit: Payer: Self-pay | Admitting: Family Medicine

## 2012-02-25 ENCOUNTER — Encounter: Payer: Self-pay | Admitting: Family Medicine

## 2012-02-25 ENCOUNTER — Ambulatory Visit (INDEPENDENT_AMBULATORY_CARE_PROVIDER_SITE_OTHER): Payer: Medicare Other | Admitting: Family Medicine

## 2012-02-25 VITALS — BP 130/78 | HR 64 | Ht 67.0 in | Wt 165.0 lb

## 2012-02-25 DIAGNOSIS — E119 Type 2 diabetes mellitus without complications: Secondary | ICD-10-CM

## 2012-02-25 DIAGNOSIS — I10 Essential (primary) hypertension: Secondary | ICD-10-CM

## 2012-02-25 DIAGNOSIS — G609 Hereditary and idiopathic neuropathy, unspecified: Secondary | ICD-10-CM

## 2012-02-25 DIAGNOSIS — M722 Plantar fascial fibromatosis: Secondary | ICD-10-CM

## 2012-02-25 DIAGNOSIS — N401 Enlarged prostate with lower urinary tract symptoms: Secondary | ICD-10-CM

## 2012-02-25 DIAGNOSIS — E1129 Type 2 diabetes mellitus with other diabetic kidney complication: Secondary | ICD-10-CM

## 2012-02-25 LAB — POCT GLYCOSYLATED HEMOGLOBIN (HGB A1C): Hemoglobin A1C: 7.5

## 2012-02-25 MED ORDER — OXYBUTYNIN CHLORIDE 5 MG PO TABS: 5.0000 mg | ORAL_TABLET | Freq: Every day | ORAL | Status: DC | PRN

## 2012-02-25 MED ORDER — INSULIN GLARGINE 100 UNIT/ML ~~LOC~~ SOLN: 37.0000 [IU] | Freq: Every day | SUBCUTANEOUS | Status: DC

## 2012-02-25 NOTE — Progress Notes (Signed)
  Subjective:    Patient ID: Tanner Mason, male    DOB: Jul 17, 1940, 71 y.o.   MRN: [id number]  HPI generally he is feeling good Hypertension - he saw the nephrologist Dr. Lowell Guitar who recommended adding amlodipine for his elevated blood pressure. He is tolerating that well the past 5 days . He has a renal ultrasound ordered for 2 days from now. His goal blood pressure is 120-130/70-80  Foot pain- his plantar fasciitis has gotten much better with further stretching and shoe inserts. He wears tennis running shoes at all times.   Neuropathy - well controlled on the gabapentin  Diabetes - fasting was 195 last evening it was 125. It was in the 300s in the nephrologist office one hour after eating. Hasn't had hypoglycemic episodes. Lunch is his biggest meal the day which he eats between 2 and 3 PM.   Angioedema - only very slight swelling the tip of his tongue at times.   Prostatic hypertrophy - has nocturia x4-5. The daytime sometimes has urgency. Asked about medication to delay that for when he doesn't have convenient bathroom access.   Review of Systems  see HPI for ROS      Objective:   Physical Exam  Constitutional: He appears well-developed and well-nourished.  Cardiovascular: Normal rate and regular rhythm.   Pulmonary/Chest: Effort normal and breath sounds normal.  Musculoskeletal: He exhibits no edema.  Psychiatric: He has a normal mood and affect. His behavior is normal. Judgment and thought content normal.          Assessment & Plan:

## 2012-02-25 NOTE — Assessment & Plan Note (Signed)
Ditropan for prn use.

## 2012-02-25 NOTE — Patient Instructions (Addendum)
Increase the Lantus by 2 units weekly In about 3 weeks send Dr Sheffield Slider a diary via MyChart of your blood sugars before meals and 2 hours after meals and at bedtime to determine the need for short acting insulin.   Please return to see Dr Sheffield Slider in 3 months.

## 2012-02-25 NOTE — Assessment & Plan Note (Signed)
well controlled on Gabapentin

## 2012-02-25 NOTE — Assessment & Plan Note (Signed)
Less well controlled. May need mealtime insulin, but will start with increasing Lantus 2 units weekly for next to weeks, then to send my before and 2 hour after meals cbg's

## 2012-02-25 NOTE — Assessment & Plan Note (Signed)
He will monitor. If doesn't get to desired range in a couple weeks, will increase Amlodipine to 10 mg daily

## 2012-02-25 NOTE — Assessment & Plan Note (Signed)
improved

## 2012-02-27 ENCOUNTER — Ambulatory Visit
Admission: RE | Admit: 2012-02-27 | Discharge: 2012-02-27 | Disposition: A | Discharge status: Home / Self Care | Payer: Medicare Other | Source: Ambulatory Visit | Attending: Nephrology | Admitting: Nephrology

## 2012-02-27 DIAGNOSIS — N183 Chronic kidney disease, stage 3 unspecified: Secondary | ICD-10-CM

## 2012-03-11 ENCOUNTER — Ambulatory Visit: Payer: Medicare Other | Admitting: Sports Medicine

## 2012-03-24 LAB — HM DIABETES EYE EXAM

## 2012-03-31 ENCOUNTER — Encounter: Payer: Self-pay | Admitting: Family Medicine

## 2012-04-01 ENCOUNTER — Other Ambulatory Visit: Payer: Self-pay | Admitting: Family Medicine

## 2012-04-02 ENCOUNTER — Encounter: Payer: Self-pay | Admitting: Family Medicine

## 2012-04-03 ENCOUNTER — Other Ambulatory Visit: Payer: Self-pay | Admitting: Family Medicine

## 2012-04-07 ENCOUNTER — Other Ambulatory Visit: Payer: Self-pay | Admitting: Family Medicine

## 2012-04-08 ENCOUNTER — Other Ambulatory Visit: Payer: Self-pay | Admitting: Family Medicine

## 2012-04-08 MED ORDER — GLUCOSE BLOOD VI STRP: ORAL_STRIP | Status: DC

## 2012-04-24 ENCOUNTER — Other Ambulatory Visit: Payer: Self-pay | Admitting: Family Medicine

## 2012-05-07 ENCOUNTER — Encounter: Payer: Self-pay | Admitting: Family Medicine

## 2012-05-08 ENCOUNTER — Encounter: Payer: Self-pay | Admitting: *Deleted

## 2012-05-08 ENCOUNTER — Other Ambulatory Visit: Payer: Self-pay | Admitting: Family Medicine

## 2012-05-08 MED ORDER — INSULIN GLARGINE 100 UNIT/ML ~~LOC~~ SOLN: SUBCUTANEOUS | Status: DC

## 2012-05-08 NOTE — Telephone Encounter (Signed)
error 

## 2012-05-13 ENCOUNTER — Encounter: Payer: Self-pay | Admitting: Family Medicine

## 2012-05-26 ENCOUNTER — Ambulatory Visit: Payer: Medicare Other | Admitting: Family Medicine

## 2012-06-05 ENCOUNTER — Other Ambulatory Visit: Payer: Self-pay | Admitting: Family Medicine

## 2012-06-08 ENCOUNTER — Other Ambulatory Visit: Payer: Self-pay | Admitting: Family Medicine

## 2012-06-09 ENCOUNTER — Other Ambulatory Visit: Payer: Self-pay | Admitting: *Deleted

## 2012-06-09 MED ORDER — SIMVASTATIN 20 MG PO TABS: 20.0000 mg | ORAL_TABLET | Freq: Every day | ORAL | Status: DC

## 2012-06-16 ENCOUNTER — Encounter: Payer: Self-pay | Admitting: Family Medicine

## 2012-06-16 ENCOUNTER — Other Ambulatory Visit: Payer: Self-pay | Admitting: Family Medicine

## 2012-06-16 ENCOUNTER — Ambulatory Visit (INDEPENDENT_AMBULATORY_CARE_PROVIDER_SITE_OTHER): Payer: Medicare Other | Admitting: Family Medicine

## 2012-06-16 VITALS — BP 125/75 | HR 69 | Ht 67.0 in | Wt 170.0 lb

## 2012-06-16 DIAGNOSIS — D631 Anemia in chronic kidney disease: Secondary | ICD-10-CM

## 2012-06-16 DIAGNOSIS — N183 Chronic kidney disease, stage 3 unspecified: Secondary | ICD-10-CM

## 2012-06-16 DIAGNOSIS — E1122 Type 2 diabetes mellitus with diabetic chronic kidney disease: Secondary | ICD-10-CM

## 2012-06-16 DIAGNOSIS — D649 Anemia, unspecified: Secondary | ICD-10-CM

## 2012-06-16 DIAGNOSIS — I10 Essential (primary) hypertension: Secondary | ICD-10-CM

## 2012-06-16 DIAGNOSIS — N189 Chronic kidney disease, unspecified: Secondary | ICD-10-CM

## 2012-06-16 DIAGNOSIS — N039 Chronic nephritic syndrome with unspecified morphologic changes: Secondary | ICD-10-CM

## 2012-06-16 DIAGNOSIS — E785 Hyperlipidemia, unspecified: Secondary | ICD-10-CM

## 2012-06-16 DIAGNOSIS — E1129 Type 2 diabetes mellitus with other diabetic kidney complication: Secondary | ICD-10-CM

## 2012-06-16 LAB — POCT GLYCOSYLATED HEMOGLOBIN (HGB A1C): Hemoglobin A1C: 7.4

## 2012-06-16 NOTE — Assessment & Plan Note (Signed)
CBC

## 2012-06-16 NOTE — Assessment & Plan Note (Signed)
well controlled  

## 2012-06-16 NOTE — Assessment & Plan Note (Signed)
Will check CMET ?

## 2012-06-16 NOTE — Progress Notes (Signed)
  Subjective:    Patient ID: Tanner Mason, male    DOB: Sep 07, 1940, 72 y.o.   MRN: [id number]  Diabetes   Leaves tomorrow to visit daughter in [place] to help her move, then he and his wife will go to [place].  DIABETES MELLITUS - blood sugars don't seem as well controlled. AM fastings 150 - 160. Two hours after meals 180 to 314. Hypoglycemia once after missing breakfast.  Lightheaded if he stands too quickly.   Plantar fasciitis is nearly resolved after pads and exercises from the sports medicine clinic.   Urge incontinence - hasn't been taking Oxybutynin since it didn't seem to help much. Might try it again when driving to [place].   Review of Systems     Objective:   Physical Exam  Cardiovascular: Normal rate and regular rhythm.   Pulmonary/Chest: Effort normal and breath sounds normal.  Abdominal:  Mild abdominal obesity  Musculoskeletal:  Foot exam without deformity, lesion, or loss of sensation  Neurological: He is alert.  Psychiatric: He has a normal mood and affect.          Assessment & Plan:

## 2012-06-16 NOTE — Assessment & Plan Note (Signed)
Lipids due

## 2012-06-16 NOTE — Patient Instructions (Signed)
I will send you a message about your labs and insulin dosing.  Continue your medications the same for now.   Safe travels!

## 2012-06-16 NOTE — Assessment & Plan Note (Addendum)
Improved control, however He may need to take insulin with each meal.

## 2012-06-17 LAB — COMPREHENSIVE METABOLIC PANEL
ALT: 33 U/L (ref 0–53)
AST: 25 U/L (ref 0–37)
Albumin: 4.2 g/dL (ref 3.5–5.2)
Alkaline Phosphatase: 61 U/L (ref 39–117)
BUN: 22 mg/dL (ref 6–23)
CO2: 29 mEq/L (ref 19–32)
Calcium: 9 mg/dL (ref 8.4–10.5)
Chloride: 101 mEq/L (ref 96–112)
Creat: 1.83 mg/dL — ABNORMAL HIGH (ref 0.50–1.35)
Glucose, Bld: 123 mg/dL — ABNORMAL HIGH (ref 70–99)
Potassium: 3.7 mEq/L (ref 3.5–5.3)
Sodium: 143 mEq/L (ref 135–145)
Total Bilirubin: 0.7 mg/dL (ref 0.3–1.2)
Total Protein: 6.8 g/dL (ref 6.0–8.3)

## 2012-06-17 LAB — CBC
HCT: 34.7 % — ABNORMAL LOW (ref 39.0–52.0)
Hemoglobin: 11.6 g/dL — ABNORMAL LOW (ref 13.0–17.0)
MCH: 30.9 pg (ref 26.0–34.0)
MCHC: 33.4 g/dL (ref 30.0–36.0)
MCV: 92.5 fL (ref 78.0–100.0)
Platelets: 139 10*3/uL — ABNORMAL LOW (ref 150–400)
RBC: 3.75 MIL/uL — ABNORMAL LOW (ref 4.22–5.81)
RDW: 15.4 % (ref 11.5–15.5)
WBC: 5.7 10*3/uL (ref 4.0–10.5)

## 2012-06-17 LAB — LDL CHOLESTEROL, DIRECT: Direct LDL: 41 mg/dL

## 2012-06-18 ENCOUNTER — Encounter: Payer: Self-pay | Admitting: Family Medicine

## 2012-06-24 ENCOUNTER — Other Ambulatory Visit: Payer: Self-pay | Admitting: Family Medicine

## 2012-06-24 MED ORDER — HYDROCHLOROTHIAZIDE 25 MG PO TABS: 12.5000 mg | ORAL_TABLET | Freq: Every day | ORAL | Status: DC

## 2012-06-24 NOTE — Addendum Note (Signed)
Addended by: Damita Lack on: 06/24/2012 12:24 PM   Modules accepted: Orders

## 2012-07-07 ENCOUNTER — Encounter: Payer: Self-pay | Admitting: Family Medicine

## 2012-07-07 ENCOUNTER — Ambulatory Visit (INDEPENDENT_AMBULATORY_CARE_PROVIDER_SITE_OTHER): Payer: Medicare Other | Admitting: Family Medicine

## 2012-07-07 VITALS — BP 115/73 | HR 75 | Temp 98.0°F | Ht 67.0 in | Wt 165.0 lb

## 2012-07-07 DIAGNOSIS — E1129 Type 2 diabetes mellitus with other diabetic kidney complication: Secondary | ICD-10-CM

## 2012-07-07 DIAGNOSIS — N390 Urinary tract infection, site not specified: Secondary | ICD-10-CM

## 2012-07-07 DIAGNOSIS — T783XXA Angioneurotic edema, initial encounter: Secondary | ICD-10-CM

## 2012-07-07 DIAGNOSIS — J069 Acute upper respiratory infection, unspecified: Secondary | ICD-10-CM | POA: Insufficient documentation

## 2012-07-07 MED ORDER — INSULIN GLARGINE 100 UNIT/ML ~~LOC~~ SOLN: 40.0000 [IU] | Freq: Two times a day (BID) | SUBCUTANEOUS | Status: DC

## 2012-07-07 MED ORDER — EPINEPHRINE 0.3 MG/0.3ML IJ DEVI: 0.3000 mg | Freq: Once | INTRAMUSCULAR | Status: DC

## 2012-07-07 MED ORDER — PREDNISONE 20 MG PO TABS: 40.0000 mg | ORAL_TABLET | Freq: Every day | ORAL | Status: DC

## 2012-07-07 NOTE — Assessment & Plan Note (Signed)
Tongue swelling already improving, but because it has been affecting his airway will have him take a short course of Prednisone and tell me in 3 days how he is doing.

## 2012-07-07 NOTE — Progress Notes (Signed)
  Subjective:    Patient ID: Tanner Mason, male    DOB: 01/31/1941, 72 y.o.   MRN: [id number]  HPI URI - began about 10 days ago with nasal congestion, dry throat, and cough. The latter is getting better, but still some difficulty breathing through the nose. No headache, sinus pain, fever or chills. No spinning vertigo, but feels off balance like he's leaning forward. Feels weak and sleepy. Claritin D plus Benadryl isn't helping.   Angioedema - began 3 days ago with swelling of the face, lips and tongue. The tongue swelling somewhat interfered with sleep last night, but is better today. No wheezing. Also felt like his feet were swelling, but no urticaria.   diabetes mellitus - he is up to Lantus 40 mg twice daily and still running capilllary blood glucose's in the 190's fasting. No hypoglycemic symptoms    Review of Systems     Objective:   Physical Exam  Constitutional: No distress.  HENT:  Right Ear: External ear normal.  Left Ear: External ear normal.  Nose: Nose normal.  Mouth/Throat: Oropharynx is clear and moist. No oropharyngeal exudate.  Mild swelling of the lips, cheeks and tongue  Eyes: Conjunctivae are normal.  Neck: No tracheal deviation present. No thyromegaly present.  Cardiovascular: Normal rate and regular rhythm.   No murmur heard. Pulmonary/Chest: Effort normal and breath sounds normal. No stridor. He has no wheezes. He has no rales.  Abdominal: Soft. He exhibits no distension. There is no tenderness.  Lymphadenopathy:    He has no cervical adenopathy.  Neurological: He is alert. No cranial nerve deficit. Coordination normal.  Skin: No rash noted.          Assessment & Plan:

## 2012-07-07 NOTE — Patient Instructions (Addendum)
Take Prednisone 20 mg 2 tablets daily for 3 days.  Use the Epi Pen if the tongue or throat swelling recur. Call Dr Sheffield Slider Friday AM with how you are doing and we'll decide on further treatment. Continue plain Loratadine 10 mg daily and as needed Diphenhydramine 25 - 50 mg every 4 hours as needed.  Schedule diabetes management appointment with Dr Raymondo Band in 1 - 2 weeks.

## 2012-07-07 NOTE — Assessment & Plan Note (Signed)
Appears to be resolving. Warnings given of signs of secondary sickening.

## 2012-07-07 NOTE — Assessment & Plan Note (Signed)
Anticipate elevation of his glucose on the prednisone. Gradually increase the Lantus, but suspect he may need mealtime insulin. Will arrange consult with Dr Raymondo Band in pharmacy clinic in 1-2 weeks.

## 2012-07-10 ENCOUNTER — Other Ambulatory Visit: Payer: Self-pay | Admitting: Family Medicine

## 2012-07-10 ENCOUNTER — Telehealth: Payer: Self-pay | Admitting: Family Medicine

## 2012-07-10 NOTE — Telephone Encounter (Signed)
I called him for follow up and his angioedema symptoms have resolved with capilllary blood glucoses still high but not into the 200's. He will restart the Prednisone if angioedema symptoms recur. He will continue Lantus 40 units twice daily, but may increase by 1 unit twice daily daily if glucoses are higher. He will call to schedule with Dr Raymondo Band to determine if there are alternatives to starting mealtime insulin for better control.

## 2012-07-13 ENCOUNTER — Other Ambulatory Visit: Payer: Self-pay | Admitting: *Deleted

## 2012-07-13 MED ORDER — OMEPRAZOLE 20 MG PO CPDR: 20.0000 mg | DELAYED_RELEASE_CAPSULE | Freq: Every day | ORAL | Status: DC

## 2012-07-26 ENCOUNTER — Other Ambulatory Visit: Payer: Self-pay | Admitting: Family Medicine

## 2012-08-06 ENCOUNTER — Ambulatory Visit (INDEPENDENT_AMBULATORY_CARE_PROVIDER_SITE_OTHER): Payer: 59 | Admitting: Pharmacist

## 2012-08-06 ENCOUNTER — Encounter: Payer: Self-pay | Admitting: Pharmacist

## 2012-08-06 VITALS — BP 131/76 | HR 73 | Ht 67.0 in | Wt 169.0 lb

## 2012-08-06 DIAGNOSIS — E1129 Type 2 diabetes mellitus with other diabetic kidney complication: Secondary | ICD-10-CM

## 2012-08-06 MED ORDER — LIRAGLUTIDE 18 MG/3ML ~~LOC~~ SOLN: 1.2000 mg | Freq: Every day | SUBCUTANEOUS | Status: DC

## 2012-08-06 NOTE — Patient Instructions (Addendum)
Decrease your Lantus to 20 units twice a day. In one week decrease the Lantus to 30 units once a day.   Start taking Victoza 0.6 mg once a day for one week and then increase to 1.2 mg daily.  If you feel full, stop eating. This will help to prevent the nausea.  Check your blood sugar more often 2 hours after meals (breakfast, lunch, or dinner)  Also check your blood sugar when you feel low  If you have multiple blood glucose readings < 90, call us.  Come back to see Dr. Raymondo Band in 3 weeks.

## 2012-08-06 NOTE — Assessment & Plan Note (Addendum)
Diabetes of 15 yrs duration currently under good control of blood glucose based on  . Lab Results  Component Value Date   HGBA1C 7.4 06/16/2012     ,home fasting CBG readings of 73-206 and random CBG readings of 119-280. Control is suboptimal after meals due to inadequate insulin coverage and dietary indiscretion. Denies hypoglycemic events.  Able to verbalize appropriate hypoglycemia management plan. Decreased dose of basal insulin Lantus (insulin glargine) to 20 units twice a day. In one week, he will decrease it to 30 units daily. Victoza was also initiated. He is to inject 0.6 mg daily for one week and then titrate up to 1.2 mg daily. All of the patient's questions concerning his diabetes and medications were answered. Written patient instructions provided. Patient instructed to call if multiple blood glucose readings <90.  Stop Januvia at next visit. Follow up in  Pharmacist Clinic Visit in 3 weeks.   Total time in face to face counseling 35 minutes.  Patient seen with Juanita Craver, PharmD candidate.

## 2012-08-06 NOTE — Progress Notes (Signed)
  Subjective:    Patient ID: Tanner Mason, male    DOB: Oct 15, 1940, 72 y.o.   MRN: [id number]  HPI Patient arrives to clinic in good spirits.   He reports taking Lantus 30 units twice a day.   He denies that he has ever been prescribed Novolog.  He brings his log book. He reports that his blood glucose has been well controlled and that he was able to come down on his dose of Lantus.   Patient had many questions about diabetes and his medications. He is also desires to lose weight with goal weight of 140-150lbs.   Review of Systems     Objective:   Physical Exam        Assessment & Plan:   Diabetes of 15 yrs duration currently under good control of blood glucose based on  . Lab Results  Component Value Date   HGBA1C 7.4 06/16/2012     ,home fasting CBG readings of 73-206 and random CBG readings of 119-280. Control is suboptimal after meals due to inadequate insulin coverage and dietary indiscretion. Denies hypoglycemic events.  Able to verbalize appropriate hypoglycemia management plan. Decreased dose of basal insulin Lantus (insulin glargine) to 20 units twice a day. In one week, he will decrease it to 30 units daily. Victoza was also initiated. He is to inject 0.6 mg daily for one week and then titrate up to 1.2 mg daily. All of the patient's questions concerning his diabetes and medications were answered. Written patient instructions provided. Patient instructed to call if multiple blood glucose readings <90.  Follow up in  Pharmacist Clinic Visit in 3 weeks.   Total time in face to face counseling 35 minutes.  Patient seen with Juanita Craver, PharmD candidate. Marland Kitchen

## 2012-08-07 NOTE — Progress Notes (Signed)
Patient ID: Tanner Mason, male   DOB: 10/03/1940, 72 y.o.   MRN: [id number] Reviewed: Agree with Dr. Koval's documentation and management. 

## 2012-08-11 ENCOUNTER — Telehealth: Payer: Self-pay | Admitting: Family Medicine

## 2012-08-11 NOTE — Telephone Encounter (Signed)
Message for Dr. Raymondo Band.  Since Sunday, patient has had tremendous gas in his stomach with constant churning.  He isn't sure if this is from the insulin and he would like to discuss this.

## 2012-08-17 NOTE — Telephone Encounter (Signed)
Contacted patient. He stated that the gas subsided and that it could have been due to the food that he ate 4/12. He increased the dose to 1.2 on  4/18.  4/21, he reports that he had diarrhea. He is to have a colonoscopy 4/22 and was told to hold meds.  His blood glucose have continued to be well controlled. He reports one episode of hypoglycemia (blood glucose 55) and he treated it with a coke.    He will hold his Lantus and Victoza tomorrow for colonoscopy as instructed by MD. He also held his Januvia.   I instructed the patient to restart the Victoza at 0.6 mg daily and Lantus 30 units once daily on Wednesday 4/23.   Plan f/u phone call Wednesday.

## 2012-08-20 ENCOUNTER — Telehealth: Payer: Self-pay | Admitting: Pharmacist

## 2012-08-20 NOTE — Telephone Encounter (Signed)
Doing well with lower dose of Victoza (0.6 mg) and Lantus 30 units daily and will continue this until his appt 08/27/12. Not experiencing any side effects now. Blood glucose 90s-140s.

## 2012-08-27 ENCOUNTER — Ambulatory Visit (INDEPENDENT_AMBULATORY_CARE_PROVIDER_SITE_OTHER): Payer: 59 | Admitting: Pharmacist

## 2012-08-27 ENCOUNTER — Encounter: Payer: Self-pay | Admitting: Pharmacist

## 2012-08-27 VITALS — BP 129/74 | HR 88 | Ht 67.5 in | Wt 164.0 lb

## 2012-08-27 DIAGNOSIS — E1129 Type 2 diabetes mellitus with other diabetic kidney complication: Secondary | ICD-10-CM

## 2012-08-27 NOTE — Progress Notes (Signed)
  Subjective:    Patient ID: Tanner Mason, male    DOB: 04-14-1941, 72 y.o.   MRN: [id number]  HPI Patient arrived to clinic in good spirits today with her blood glucose log.  He reports that he has been tolerating the Victoza well.  He states that he has been eating smaller portions but has not exercised.  He reports one random CBG reading of 318 but that it was due to a milkshake.   He denies any changes in his vision or neuropathy. He reports nocturia 2-3 times per night.    Review of Systems     Objective:   Physical Exam        Assessment & Plan:   Diabetes of many yrs duration currently under good control of blood glucose based on  . Lab Results  Component Value Date   HGBA1C 7.4 06/16/2012     ,home fasting CBG readings of 80-140s and random CBG readings of 78-176. Control is suboptimal due to dietary indiscretion and lack of physical activity. Denies hypoglycemic events.  Able to verbalize appropriate hypoglycemia management plan. Decreased dose of basal insulin Lantus (insulin glargine) from 30 units to 15 units daily, and if patient has multiple readings >150, to increase Lantus to 20 units. Victoza increased to 1.2 mg.  Written patient instructions provided.  Follow up in  Pharmacist Clinic Visit in June.   Total time in face to face counseling 20 minutes.  Patient seen with Juanita Craver, PharmD Candidate.

## 2012-08-27 NOTE — Patient Instructions (Addendum)
It was great to see you again today! Congratulations on losing 6 pounds!  Increase your Victoza to 1.2 mg once daily  Decrease your Lantus to 15-20 units depending on your blood sugar. Try the 15 units and if you notice that you have multiple readings higher than 150, then increase to 20 units.  Return to see Dr. Raymondo Band in June.

## 2012-08-27 NOTE — Assessment & Plan Note (Signed)
Diabetes of many yrs duration currently under good control of blood glucose based on  . Lab Results  Component Value Date   HGBA1C 7.4 06/16/2012     ,home fasting CBG readings of 80-140s and random CBG readings of 78-176. Control is suboptimal due to dietary indiscretion and lack of physical activity. Denies hypoglycemic events.  Able to verbalize appropriate hypoglycemia management plan. Decreased dose of basal insulin Lantus (insulin glargine) from 30 units to 15 units daily, and if patient has multiple readings >150, to increase Lantus to 20 units. Victoza increased to 1.2 mg.  Written patient instructions provided.  Follow up in  Pharmacist Clinic Visit in June.   Total time in face to face counseling 20 minutes.  Patient seen with Juanita Craver, PharmD Candidate

## 2012-08-28 NOTE — Progress Notes (Signed)
Patient ID: Tanner Mason, male   DOB: 12/24/1940, 72 y.o.   MRN: [id number] Reviewed: Agree with Dr. Koval's documentation and management. 

## 2012-09-15 ENCOUNTER — Ambulatory Visit (INDEPENDENT_AMBULATORY_CARE_PROVIDER_SITE_OTHER): Payer: 59 | Admitting: Family Medicine

## 2012-09-15 ENCOUNTER — Encounter: Payer: Self-pay | Admitting: Family Medicine

## 2012-09-15 VITALS — BP 126/64 | HR 89 | Ht 67.0 in | Wt 164.0 lb

## 2012-09-15 DIAGNOSIS — I10 Essential (primary) hypertension: Secondary | ICD-10-CM

## 2012-09-15 DIAGNOSIS — Z6825 Body mass index (BMI) 25.0-25.9, adult: Secondary | ICD-10-CM

## 2012-09-15 DIAGNOSIS — E663 Overweight: Secondary | ICD-10-CM | POA: Insufficient documentation

## 2012-09-15 DIAGNOSIS — E1129 Type 2 diabetes mellitus with other diabetic kidney complication: Secondary | ICD-10-CM

## 2012-09-15 LAB — POCT GLYCOSYLATED HEMOGLOBIN (HGB A1C): Hemoglobin A1C: 6.3

## 2012-09-15 MED ORDER — METOPROLOL TARTRATE 100 MG PO TABS: ORAL_TABLET | ORAL | Status: DC

## 2012-09-15 NOTE — Assessment & Plan Note (Signed)
Encouraged restriction of carbs and fats and increased exercise

## 2012-09-15 NOTE — Assessment & Plan Note (Signed)
well controlled on the increased Amlodipine

## 2012-09-15 NOTE — Patient Instructions (Addendum)
Decrease your salt intake if the ankle swelling gets worse  I will be retiring in the summer of this year. Thank you for allowing me to work with you in maintaining your health. Donnella Sham, MD will be my replacement beginning the second week in August. He is moving to [place] having completed his family medicine residency training in [place], [place]. Please let me know if you would like to be assigned to a different physician.   Please return to see Dr Randolm Idol in 3 months.

## 2012-09-15 NOTE — Progress Notes (Signed)
  Subjective:    Patient ID: Tanner Mason, male    DOB: March 07, 1941, 72 y.o.   MRN: [id number]  HPI diabetes mellitus - happy with his improved control using the Victoza. Has been busy traveling, but hasn't been able to increase exercise. No hypoglycemic episodes.      Review of Systems     Objective:   Physical Exam        Assessment & Plan:

## 2012-09-29 ENCOUNTER — Encounter: Payer: Self-pay | Admitting: Pharmacist

## 2012-09-29 ENCOUNTER — Ambulatory Visit (INDEPENDENT_AMBULATORY_CARE_PROVIDER_SITE_OTHER): Payer: 59 | Admitting: Pharmacist

## 2012-09-29 ENCOUNTER — Telehealth: Payer: Self-pay | Admitting: *Deleted

## 2012-09-29 VITALS — BP 124/76 | HR 90 | Wt 157.4 lb

## 2012-09-29 DIAGNOSIS — E1129 Type 2 diabetes mellitus with other diabetic kidney complication: Secondary | ICD-10-CM

## 2012-09-29 MED ORDER — LIRAGLUTIDE 18 MG/3ML ~~LOC~~ SOPN: 1.8000 mg | PEN_INJECTOR | Freq: Every day | SUBCUTANEOUS | Status: DC

## 2012-09-29 MED ORDER — INSULIN GLARGINE 100 UNIT/ML SOLOSTAR PEN: PEN_INJECTOR | SUBCUTANEOUS | Status: DC

## 2012-09-29 MED ORDER — INSULIN GLARGINE 100 UNIT/ML ~~LOC~~ SOLN: 25.0000 [IU] | Freq: Every day | SUBCUTANEOUS | Status: DC

## 2012-09-29 NOTE — Assessment & Plan Note (Signed)
  Diabetes of 54yrs duration currently under good control of blood glucose based on  . Lab Results  Component Value Date   HGBA1C 6.3 09/15/2012     ,home fasting CBG readings of on average ~150-160. Control is suboptimal due to dietary indiscretion d/t life events (wedding/trip), however overall has lost 7 pounds since last visit- congratulated on success. Denies hypoglycemic events.  Able to verbalize appropriate hypoglycemia management plan. Increased dose of basal insulin Lantus (insulin glargine) to 25 units daily. Victoza increased to 1.8mg  daily. Januvia d/c'd. Written patient instructions provided.  Follow up in  Pharmacist Clinic Visit 1-2 months.   Total time in face to face counseling 20 minutes.  Patient seen with Brigid Re, PharmD Resident.

## 2012-09-29 NOTE — Progress Notes (Signed)
  Subjective:    Patient ID: Tanner Mason, male    DOB: November 16, 1940, 72 y.o.   MRN: [id number]  HPI Patient arrives in good spirits walking without assistance. Down 7 pounds since last visit despite a few weeks of lifestyle changes due to trips.  Reports worsened control with blood sugars 120-220.  Review of Systems     Objective:   Physical Exam        Assessment & Plan:   Diabetes of 17yrs duration currently under good control of blood glucose based on  . Lab Results  Component Value Date   HGBA1C 6.3 09/15/2012     ,home fasting CBG readings of on average ~150-160. Control is suboptimal due to dietary indiscretion d/t life events (wedding/trip), however overall has lost 7 pounds since last visit- congratulated on success. Denies hypoglycemic events.  Able to verbalize appropriate hypoglycemia management plan. Increased dose of basal insulin Lantus (insulin glargine) to 25 units daily. Victoza increased to 1.8mg  daily. Januvia d/c'd. Written patient instructions provided.  Follow up in  Pharmacist Clinic Visit 1-2 months.   Total time in face to face counseling 20 minutes.  Patient seen with Brigid Re, PharmD Resident.

## 2012-09-29 NOTE — Patient Instructions (Addendum)
Stop Januvia  Increase your Victoza - to 1.8mg  pens  Increase you Lantus to 25 units once daily  Continue to exercise 3 or more days per week.   Next visit in July with New doctor or Dr. Raymondo Band - Rx Clinic.

## 2012-09-30 ENCOUNTER — Telehealth: Payer: Self-pay | Admitting: Nephrology

## 2012-09-30 NOTE — Telephone Encounter (Signed)
Pt has been taking insulin by the pen. RX was written for vial.  Phamacy [facility] [address] states RX needs to be changed to pen Please advise

## 2012-09-30 NOTE — Progress Notes (Signed)
Patient ID: Tanner Mason, male   DOB: 05/01/1940, 72 y.o.   MRN: [id number] Reviewed: Agree with Dr. Koval's documentation and management. 

## 2012-10-22 ENCOUNTER — Other Ambulatory Visit: Payer: Self-pay | Admitting: *Deleted

## 2012-10-26 ENCOUNTER — Other Ambulatory Visit: Payer: Self-pay | Admitting: Family Medicine

## 2012-11-02 LAB — BASIC METABOLIC PANEL
BUN: 22 mg/dL — AB (ref 4–21)
Creatinine: 1.8 mg/dL — AB (ref 0.6–1.3)
Glucose: 183 mg/dL
Potassium: 3.2 mmol/L — AB (ref 3.4–5.3)
Sodium: 136 mmol/L — AB (ref 137–147)

## 2012-11-03 ENCOUNTER — Ambulatory Visit (INDEPENDENT_AMBULATORY_CARE_PROVIDER_SITE_OTHER): Payer: 59 | Admitting: Pharmacist

## 2012-11-03 ENCOUNTER — Encounter: Payer: Self-pay | Admitting: Pharmacist

## 2012-11-03 VITALS — Wt 158.4 lb

## 2012-11-03 DIAGNOSIS — E1129 Type 2 diabetes mellitus with other diabetic kidney complication: Secondary | ICD-10-CM

## 2012-11-03 MED ORDER — LIRAGLUTIDE 18 MG/3ML ~~LOC~~ SOPN: 1.8000 mg | PEN_INJECTOR | Freq: Every day | SUBCUTANEOUS | Status: DC

## 2012-11-03 MED ORDER — INSULIN GLARGINE 100 UNIT/ML SOLOSTAR PEN: PEN_INJECTOR | SUBCUTANEOUS | Status: DC

## 2012-11-03 NOTE — Progress Notes (Signed)
S:  Patient arrives in good spirits.   States he has been doing well with diabetes.   He is happy that his weight is reduced back to levels seen in 2012.   He would like his weight to be 145-150.   Home CBGs are reported to be good, denies hypoglycemia.   O: Home CBGs - AM fasting routinely > 200 however last week CBGs have been < 200 with lowest 123.   A:  Longstanding Diabetes in patient who appears to be tolerating combination of liraglutide (Victoza) and Lantus without hypoglycemia and able to reduce weight back to lowest weight in > 1 year.    Willing to increase lantus until fasting CBGs have reached ~ 100.   Increased dose of basal insulin Lantus (insulin glargine). Patient will continue to titrate 1 unit/day if fasting CBGs > 100mg /dl until fasting CBGs reach goal or next visit.  Written patient instructions provided.  Follow up in  Pharmacist Clinic Visit after last visit with Dr. Sheffield Slider in August.  Total time in face to face counseling 25 minutes.   Clarified doses and instructions with new Rxs to patient's pharmacy.

## 2012-11-03 NOTE — Patient Instructions (Addendum)
Continue Victoza 1.8 daily.   Increase your lantus 1 unit per day if your morning fasting is higher than 100.  Likely need to get to 40-45 units in the AM to improve control back to goal.   Good luck with your weight loss goal.

## 2012-11-03 NOTE — Assessment & Plan Note (Signed)
Longstanding Diabetes in patient who appears to be tolerating combination of liraglutide (Victoza) and Lantus without hypoglycemia and able to reduce weight back to lowest weight in > 1 year.    Willing to increase lantus until fasting CBGs have reached ~ 100.   Increased dose of basal insulin Lantus (insulin glargine). Patient will continue to titrate 1 unit/day if fasting CBGs > 100mg /dl until fasting CBGs reach goal or next visit.  Written patient instructions provided.  Follow up in  Pharmacist Clinic Visit after last visit with Dr. Sheffield Slider in August.  Total time in face to face counseling 25 minutes.

## 2012-11-04 ENCOUNTER — Encounter: Payer: Self-pay | Admitting: Family Medicine

## 2012-11-04 LAB — PTH, INTACT: PTH Interp: 96

## 2012-11-04 NOTE — Progress Notes (Signed)
Patient ID: Tanner Mason, male   DOB: 02/13/1941, 72 y.o.   MRN: [id number] Reviewed: Agree with Dr. Koval's documentation and management. 

## 2012-11-28 ENCOUNTER — Other Ambulatory Visit: Payer: Self-pay | Admitting: Family Medicine

## 2012-12-15 ENCOUNTER — Encounter: Payer: Self-pay | Admitting: Family Medicine

## 2012-12-15 ENCOUNTER — Ambulatory Visit (INDEPENDENT_AMBULATORY_CARE_PROVIDER_SITE_OTHER): Payer: 59 | Admitting: Family Medicine

## 2012-12-15 VITALS — BP 137/84 | HR 86 | Temp 98.3°F | Ht 67.0 in | Wt 161.0 lb

## 2012-12-15 DIAGNOSIS — E1129 Type 2 diabetes mellitus with other diabetic kidney complication: Secondary | ICD-10-CM

## 2012-12-15 DIAGNOSIS — I10 Essential (primary) hypertension: Secondary | ICD-10-CM

## 2012-12-15 DIAGNOSIS — G609 Hereditary and idiopathic neuropathy, unspecified: Secondary | ICD-10-CM

## 2012-12-15 LAB — POCT GLYCOSYLATED HEMOGLOBIN (HGB A1C): Hemoglobin A1C: 6.5

## 2012-12-15 NOTE — Assessment & Plan Note (Addendum)
Diabetes remains relatively controlled however fasting glucoses have been elevated and A1c showed slight rise to 6.5. Currently on 40 units of Lantus daily in addition to Victoza and glipizide. No hypoglycemic episodes. Up-to-date on diabetic eye exam and routine foot examinations. -He was counseled to gradually increase Lantus dose by one unit every 3 days as needed to get fasting blood glucoses less than 120. -Counseled patient to check occasional preprandial glucoses over the next few weeks -If glucoses and A1c continue to rise may consider transition to mealtime insulin coverage.

## 2012-12-15 NOTE — Patient Instructions (Addendum)
Diabetes - please increase Lantus by 1 unit every 3 days until fasting blood glucoses are less than 120. Please check preprandial (before breakfast/lunch/dinner) a few times per week. If blood glucoses remain elevated may consider switch to mealtime insulin in addition to Lantus.   No changes to blood pressure medications. No changes to Gabapentin today.   Follow up with Dr. Raymondo Band in 1-2 weeks  Follow up with Dr. Randolm Idol in 1-2 months.

## 2012-12-15 NOTE — Progress Notes (Signed)
  Subjective:    Patient ID: Tanner Mason, male    DOB: November 08, 1940, 72 y.o.   MRN: [id number]  HPI 72 year old male presents for followup of multiple medical conditions.  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 He is currently on 40 units of Lantus daily, glipizide 5 mg 2 tablets in the morning and 1 tablet in the evening, and Victoza 1.8 mg daily. Tolerating medications well, denies hypoglycemic episodes, he has had diabetic eye and foot examination performed within the past year  Diabetic peripheral neuropathy: Patient's symptoms have been well controlled on current dose of gabapentin  Hypertension: Patient currently on amlodipine 10 mg daily, Lopressor 100 mg twice daily, and Hydrocort Dyazide 25 mg daily, his blood pressure medications are comanaged between family practice and nephrology. Patient states that he does have occasional lightheadedness with standing, denies loss of consciousness, this occurs infrequently.   Review of Systems  Constitutional: Positive for chills. Negative for fever.  Respiratory: Negative for cough and wheezing.   Cardiovascular: Negative for chest pain.  Gastrointestinal: Negative for nausea, vomiting and diarrhea.       Objective:   Physical Exam Vitals: Reviewed HEENT: Normocephalic, pupils are round reactive light, extraocular movements are intact, moist mucous membranes, uvula midline, neck was supple without evidence of lymphadenopathy General: Pleasant male, no acute distress Cardiac: Regular in rhythm, S1 and S2 present, no murmurs, no heaves or thrills Respiratory: Clear to auscultation bilaterally, good effort Vascular: 2+ dorsalis pedis and posterior tibial pulses, no lower extremity edema Skin examination: No foot ulcerations noted, normal diabetic foot exam       Assessment & Plan:  Please see  problem specific assessment and plan

## 2012-12-15 NOTE — Assessment & Plan Note (Signed)
Well-controlled on current dose of gabapentin. -No change in current management

## 2012-12-15 NOTE — Assessment & Plan Note (Signed)
Blood pressure is well-controlled. He does have occasional symptoms that may be consistent with orthostatic hypotension. -Patient to continue current therapy with amlodipine, Lopressor, and HCTZ. Patient at goal blood pressure given CK3. -He was counseled that if he continues to have episodes of lightheadedness that he should return to office for further evaluation of blood pressure and possible change in pharmacotherapy.

## 2012-12-24 ENCOUNTER — Other Ambulatory Visit: Payer: Self-pay | Admitting: Family Medicine

## 2013-01-07 ENCOUNTER — Encounter: Payer: Self-pay | Admitting: Pharmacist

## 2013-01-07 ENCOUNTER — Ambulatory Visit (INDEPENDENT_AMBULATORY_CARE_PROVIDER_SITE_OTHER): Payer: 59 | Admitting: Pharmacist

## 2013-01-07 VITALS — BP 126/63 | HR 93 | Ht 67.0 in | Wt 164.5 lb

## 2013-01-07 DIAGNOSIS — E1129 Type 2 diabetes mellitus with other diabetic kidney complication: Secondary | ICD-10-CM

## 2013-01-07 NOTE — Patient Instructions (Addendum)
Thanks for visiting [place] today!  Keep up the good work. Your blood sugar readings a week. With your current readings, we will continue with Lantus 45 units once daily in the morning in addition to your glipizide and victoza Cooking at home more often will help with diet and reducing weight.  Next visit with Dr. Randolm Idol

## 2013-01-07 NOTE — Progress Notes (Signed)
S:    Patient arrives in good spirits with small bag of medications with him today. He presents to the clinic for diabetes follow up, currently on Glipizide 5mg  (two tablets qam, one tablet qpm), Victoza 1.8mg  sq once daily, and Lantus, which was increased to 45 units once daily in the morning. Over the last few visits, patient has been concerned with weight, stating his goal weight is 148-150 lbs. He exercises 3 times a week at the [facility] but admits to eating out most nights of the week with his wife.Patient reports having long standing diabetes > 15 years. After initiating Victoza in 07/2012, patient's CBGs seem to be under control with readings in his log book in the 160s-180s.   O: BP 126/63 HR 93 Weight today: 164.5 lb (trending slightly up since last visit)  A/P: Long standing diabetes > 15 years currently currently on Glipizide 5mg  (two tabs QAM, one tablet QPM), Victoza 1.8mg  sq once daily, Lantus 45 units once daily in the AM Lab Results  Component Value Date   HGBA1C 6.5 12/15/2012   AND home fasting CBG readings of 160s-180s.  Denies hypoglycemic events and is able to verbalize appropriate hypoglycemia management plan.  Reports adherence with medication and relatively active lifestyle, going to the [employer] 3x per week. However, he admits to eating out most days of the week. Patient is meeting goals for blood glucose control at this time with an A1C of 6.5. To improve further weight loss and healthy lifestyle, encouraged patient to continue exercising while trying to reduce eating out as much as possible. All current diabetes medications continued with no changes. Written patient instructions provided.  Next visit is with Dr. Randolm Idol. Total time in face to face counseling 25 minutes.  Patient seen with Anthony Sar, PharmD resident.

## 2013-01-07 NOTE — Assessment & Plan Note (Signed)
Long standing diabetes > 15 years currently currently on Glipizide 5mg  (two tabs QAM, one tablet QPM), Victoza 1.8mg  sq once daily, Lantus 45 units once daily in the AM  Lab Results   Component  Value  Date    HGBA1C  6.5  12/15/2012   AND home fasting CBG readings of 160s-180s. Denies hypoglycemic events and is able to verbalize appropriate hypoglycemia management plan. Reports adherence with medication and relatively active lifestyle, going to the [employer] 3x per week. However, he admits to eating out most days of the week. Patient is meeting goals for blood glucose control at this time with an A1C of 6.5. To improve further weight loss and healthy lifestyle, encouraged patient to continue exercising while trying to reduce eating out as much as possible. All current diabetes medications continued with no changes. Written patient instructions provided. Next visit is with Dr. Randolm Idol. Total time in face to face counseling 25 minutes. Patient seen with Anthony Sar, PharmD resident.

## 2013-01-11 NOTE — Progress Notes (Signed)
Patient ID: Tanner Mason, male   DOB: 03/03/1941, 72 y.o.   MRN: [id number] Reviewed: Agree with Dr. Koval's documentation and management. 

## 2013-02-08 ENCOUNTER — Other Ambulatory Visit: Payer: Self-pay | Admitting: Family Medicine

## 2013-02-09 MED ORDER — INSULIN PEN NEEDLE 32G X 4 MM MISC: Status: DC

## 2013-03-04 ENCOUNTER — Other Ambulatory Visit: Payer: Self-pay

## 2013-03-15 ENCOUNTER — Other Ambulatory Visit: Payer: Self-pay | Admitting: Family Medicine

## 2013-03-15 ENCOUNTER — Ambulatory Visit (INDEPENDENT_AMBULATORY_CARE_PROVIDER_SITE_OTHER): Payer: 59 | Admitting: Family Medicine

## 2013-03-15 ENCOUNTER — Encounter: Payer: Self-pay | Admitting: Family Medicine

## 2013-03-15 VITALS — BP 130/80 | HR 80 | Ht 67.0 in | Wt 167.0 lb

## 2013-03-15 DIAGNOSIS — K219 Gastro-esophageal reflux disease without esophagitis: Secondary | ICD-10-CM

## 2013-03-15 DIAGNOSIS — G609 Hereditary and idiopathic neuropathy, unspecified: Secondary | ICD-10-CM

## 2013-03-15 DIAGNOSIS — S81009A Unspecified open wound, unspecified knee, initial encounter: Secondary | ICD-10-CM

## 2013-03-15 DIAGNOSIS — I10 Essential (primary) hypertension: Secondary | ICD-10-CM

## 2013-03-15 DIAGNOSIS — E785 Hyperlipidemia, unspecified: Secondary | ICD-10-CM

## 2013-03-15 DIAGNOSIS — S81802A Unspecified open wound, left lower leg, initial encounter: Secondary | ICD-10-CM | POA: Insufficient documentation

## 2013-03-15 DIAGNOSIS — E1129 Type 2 diabetes mellitus with other diabetic kidney complication: Secondary | ICD-10-CM

## 2013-03-15 LAB — BASIC METABOLIC PANEL
BUN: 26 mg/dL — ABNORMAL HIGH (ref 6–23)
CO2: 28 mEq/L (ref 19–32)
Calcium: 9.5 mg/dL (ref 8.4–10.5)
Chloride: 98 mEq/L (ref 96–112)
Creat: 1.95 mg/dL — ABNORMAL HIGH (ref 0.50–1.35)
Glucose, Bld: 180 mg/dL — ABNORMAL HIGH (ref 70–99)
Potassium: 3.2 mEq/L — ABNORMAL LOW (ref 3.5–5.3)
Sodium: 135 mEq/L (ref 135–145)

## 2013-03-15 LAB — POCT GLYCOSYLATED HEMOGLOBIN (HGB A1C): Hemoglobin A1C: 7

## 2013-03-15 MED ORDER — INSULIN GLARGINE 100 UNIT/ML SOLOSTAR PEN: PEN_INJECTOR | SUBCUTANEOUS | Status: DC

## 2013-03-15 MED ORDER — ATORVASTATIN CALCIUM 40 MG PO TABS: 40.0000 mg | ORAL_TABLET | Freq: Every day | ORAL | Status: DC

## 2013-03-15 NOTE — Assessment & Plan Note (Signed)
Well-controlled on current dose of gabapentin.

## 2013-03-15 NOTE — Assessment & Plan Note (Signed)
Appears uncontrolled based on fasting blood sugars. Due for recheck of hemoglobin A1c today. Due for ophthalmology examination next month. Reports infrequent hypoglycemic episodes. -Will increase Lantus to 50 units daily -Will discontinue glipizide to prevent against hypoglycemic episodes -Continue current dose of Victoza -Patient was counseled to continue to check a.m. blood sugars as well as occasional blood sugars prior to meals and at bedtime.

## 2013-03-15 NOTE — Patient Instructions (Addendum)
Diabetes - stop Glipizide, Increase Lantus to 50 units daily, Continue Victoza, return to office in 3 months for recheck of Hemoglobin A1C  High Blood Pressure - well controlled, continue current medications  High Cholesterol - stop Simvastatin, start Lipitor 40 mg daily  Foot wound - apply vasoline daly, call if worsening  Acid Reflux - continue current dose of omeprazole, do not eat late in the night, raise your head at night    Make 2 apointments. The first in 3 months for recheck Diabetes and the second in 1-2 months for annual exam.

## 2013-03-15 NOTE — Assessment & Plan Note (Signed)
Reports infrequent nighttime symptoms of heartburn. -No change in pharmacotherapy -Recommended behavioral modification including no late night meals and elevating the head of the bed at night.

## 2013-03-15 NOTE — Assessment & Plan Note (Signed)
0.5 x 0.5 cm healing wound of the posterior aspect of the left heel, no evidence of acute cellulitis -Conservative management with Vaseline and local wound care -Given diabetes patient was counseled to return to clinic if any evidence of increased redness, warmth, or drainage.

## 2013-03-15 NOTE — Assessment & Plan Note (Signed)
Well-controlled on current regimen. No changes to medications today.

## 2013-03-15 NOTE — Progress Notes (Signed)
  Subjective:    Patient ID: Tanner Mason, male    DOB: 09/24/1940, 72 y.o.   MRN: [id number]  HPI 72 y/o male presents for follow up of multiple medical issues.  IDT2DM - currently on Lantus 45 units daily, Victoza, and Glipizide daily, fasting glucoses averaging 180's - 200's in the AM, reports hypoglycemia only 1-2 times per month, due for eye exam next month, no recent vision changes, reports some numbness of bilateral feet controlled with gabapentin  [place]un[place] Obesity - exercises 3 times per week at the local [employer], combination of weight lifting and cardio, has trouble with portion control  GERD - has occasional problems with reflux at night, currently on omeprazole 20 mg daily  HTN - currently on Amlodipine, HCTZ, and Metoprolol; compliant on medications, no side effects, no chest pain, no sob, no lightheadedness, no syncope, no vision changes  Blister on left ankle - present for the past few days, no drainage, no redness, no fevers/chills, reports rubbing from his shoes occurred in this area  Social - former smoker   Review of Systems  Constitutional: Negative for fever, chills and fatigue.  Respiratory: Negative for choking and shortness of breath.   Cardiovascular: Negative for chest pain and leg swelling.  Gastrointestinal: Negative for nausea, vomiting and diarrhea.  Skin: Positive for wound.       Objective:   Physical Exam Vitals: reviewed Gen: pleasant caucasian male, NAD HEENT: PERRL, EOMI, no scleral icterus, MMM, neck supple without adenopathy Cardiac: RRR, S1 and S2 present, no murmur, no heaves/thrills Resp: CTAB, normal effort Abd: soft, no tenderness, no rebound, no guarding, normal bowel sounds, obese Ext: no edema Skin: 0.5 by 0.5 ulceration over the left heel, no surrounding erythema, no drainage, remainder of foot examination was unremarkable        Assessment & Plan:  Please see problem specific assessment and plan.

## 2013-03-16 ENCOUNTER — Telehealth: Payer: Self-pay | Admitting: Family Medicine

## 2013-03-16 DIAGNOSIS — E876 Hypokalemia: Secondary | ICD-10-CM | POA: Insufficient documentation

## 2013-03-16 MED ORDER — POTASSIUM CHLORIDE CRYS ER 20 MEQ PO TBCR: 40.0000 meq | EXTENDED_RELEASE_TABLET | Freq: Once | ORAL | Status: DC

## 2013-03-16 NOTE — Assessment & Plan Note (Signed)
Unable to calculate ASCVD due to not having recent lipid profile, however given risk factors of age/HTN/DM patient's risk score is likely above 7.5 % (ASCVD risk for the next 10 years). Patient started on Lipitor, he is to stop Simvastatin.

## 2013-03-16 NOTE — Telephone Encounter (Signed)
Spoke to patient about lab work, Postassium low at 3.2, will supplement with 40 mEq X1 and recheck BMP in one month, may need to add chronic potassium, A1C also slightly worse - will increase Lantus to 50 units daily as discussed at last visit.

## 2013-03-19 ENCOUNTER — Encounter: Payer: Self-pay | Admitting: Family Medicine

## 2013-03-20 ENCOUNTER — Other Ambulatory Visit: Payer: Self-pay | Admitting: Family Medicine

## 2013-03-22 ENCOUNTER — Telehealth: Payer: Self-pay | Admitting: Family Medicine

## 2013-04-02 ENCOUNTER — Telehealth: Payer: Self-pay | Admitting: Family Medicine

## 2013-04-05 ENCOUNTER — Telehealth: Payer: Self-pay | Admitting: Family Medicine

## 2013-04-05 DIAGNOSIS — E876 Hypokalemia: Secondary | ICD-10-CM

## 2013-04-05 NOTE — Telephone Encounter (Signed)
Left message to make lab appointment.

## 2013-04-05 NOTE — Telephone Encounter (Signed)
Patient was told to make lab appointment to have recheck BMP (looking at potassium). Future order is in the chart.

## 2013-04-08 ENCOUNTER — Other Ambulatory Visit: Payer: 59

## 2013-04-08 ENCOUNTER — Ambulatory Visit (INDEPENDENT_AMBULATORY_CARE_PROVIDER_SITE_OTHER): Payer: 59 | Admitting: Pharmacist

## 2013-04-08 VITALS — BP 124/75 | HR 71 | Wt 165.0 lb

## 2013-04-08 DIAGNOSIS — E1129 Type 2 diabetes mellitus with other diabetic kidney complication: Secondary | ICD-10-CM

## 2013-04-08 DIAGNOSIS — E876 Hypokalemia: Secondary | ICD-10-CM

## 2013-04-08 LAB — BASIC METABOLIC PANEL
BUN: 25 mg/dL — ABNORMAL HIGH (ref 6–23)
CO2: 28 mEq/L (ref 19–32)
Calcium: 9.3 mg/dL (ref 8.4–10.5)
Chloride: 99 mEq/L (ref 96–112)
Creat: 2.07 mg/dL — ABNORMAL HIGH (ref 0.50–1.35)
Glucose, Bld: 263 mg/dL — ABNORMAL HIGH (ref 70–99)
Potassium: 3.4 mEq/L — ABNORMAL LOW (ref 3.5–5.3)
Sodium: 139 mEq/L (ref 135–145)

## 2013-04-08 MED ORDER — NATEGLINIDE 120 MG PO TABS: 120.0000 mg | ORAL_TABLET | Freq: Three times a day (TID) | ORAL | Status: DC

## 2013-04-08 NOTE — Patient Instructions (Signed)
Start Natgelinide (Starlix) one pill prior to each meal with carbohydrate.   Take ~ 15 minutes prior to meals.   Follow up with Dr. Randolm Idol in mid-late January.

## 2013-04-08 NOTE — Progress Notes (Signed)
S:    Patient arrives in good spirits with all prescription pills.    Presents for diabetes follow up.   He has been working hard with exercise and doing "fairly well" with diet.  Patient reports having history of Diabetes since the year of 1998.  Patient has taken medication for DM since 1998.   O:  . Lab Results  Component Value Date   HGBA1C 7.0 03/15/2013     home fasting CBG readings of 92-170 2 hour post-prandial/random CBG readings of up to 360 but consistently < 200.  A/P: Diabetes diagnosed in 1998 currently with worsening control based on home CBG readings but continues to be in goal A1C range.   Denies hypoglycemic events and is able to verbalize appropriate hypoglycemia management plan.  Reports adherence with medication. Patient is meeting goals for blood A1c glucose control at this time. His post-prandial readings of 250-360 are concerning as the A1C does NOT suggest he would have this significant of excursion in BG readings.  May consider evaluation of long-term control with a "fructosamine" in the future to assess for A1C inconsistency.    Due to high post prandials we discussed option for improved post-prandial BG control including meal time insulin AND OR  oral meglitinide class (Starlix-nateglinide).   We agreed to attempt use of nateglinide 120mg  TID prior to meals and reevaluated glycemic control.  Written patient instructions provided.  Follow up in Pharmacist Clinic Visit after next visit with Dr. Randolm Idol in January.   Total time in face to face counseling 40 minutes.

## 2013-04-08 NOTE — Progress Notes (Signed)
BMP DONE TODAY Tanner Mason 

## 2013-04-09 ENCOUNTER — Encounter: Payer: Self-pay | Admitting: Pharmacist

## 2013-04-09 ENCOUNTER — Telehealth: Payer: Self-pay | Admitting: Family Medicine

## 2013-04-09 DIAGNOSIS — E876 Hypokalemia: Secondary | ICD-10-CM

## 2013-04-09 MED ORDER — POTASSIUM CHLORIDE ER 10 MEQ PO TBCR: 10.0000 meq | EXTENDED_RELEASE_TABLET | Freq: Every day | ORAL | Status: DC

## 2013-04-09 NOTE — Assessment & Plan Note (Signed)
Diabetes diagnosed in 1998 currently with worsening control based on home CBG readings but continues to be in goal A1C range.   Denies hypoglycemic events and is able to verbalize appropriate hypoglycemia management plan.  Reports adherence with medication. Patient is meeting goals for blood A1c glucose control at this time. His post-prandial readings of 250-360 are concerning as the A1C does NOT suggest he would have this significant of excursion in BG readings.  May consider evaluation of long-term control with a "fructosamine" in the future to assess for A1C inconsistency.    Due to high post prandials we discussed option for improved post-prandial BG control including meal time insulin AND OR  oral meglitinide class (Starlix-nateglinide).   We agreed to attempt use of nateglinide 120mg  TID prior to meals and reevaluated glycemic control.  Written patient instructions provided.  Follow up in Pharmacist Clinic Visit after next visit with Dr. Randolm Idol in January.   Total time in face to face counseling 40 minutes.

## 2013-04-09 NOTE — Telephone Encounter (Signed)
Discussed low potassium, will start Klor Con 10 mEq daily and recheck potassium in 2-4 weeks.

## 2013-04-09 NOTE — Progress Notes (Signed)
Patient ID: Tanner Mason, male   DOB: 06/01/1940, 72 y.o.   MRN: [id number] Reviewed: Agree with Dr. Koval's documentation and management. 

## 2013-04-16 ENCOUNTER — Encounter: Payer: Self-pay | Admitting: Family Medicine

## 2013-04-20 ENCOUNTER — Other Ambulatory Visit: Payer: Self-pay | Admitting: Family Medicine

## 2013-04-20 MED ORDER — INSULIN GLARGINE 100 UNIT/ML SOLOSTAR PEN: PEN_INJECTOR | SUBCUTANEOUS | Status: DC

## 2013-04-27 ENCOUNTER — Other Ambulatory Visit: Payer: Self-pay | Admitting: Family Medicine

## 2013-05-13 ENCOUNTER — Encounter: Payer: Self-pay | Admitting: Pharmacist

## 2013-05-13 ENCOUNTER — Ambulatory Visit (INDEPENDENT_AMBULATORY_CARE_PROVIDER_SITE_OTHER): Payer: 59 | Admitting: Pharmacist

## 2013-05-13 VITALS — BP 134/78 | HR 87 | Ht 67.0 in | Wt 170.0 lb

## 2013-05-13 DIAGNOSIS — E1129 Type 2 diabetes mellitus with other diabetic kidney complication: Secondary | ICD-10-CM

## 2013-05-13 MED ORDER — INSULIN ASPART 100 UNIT/ML FLEXPEN: 10.0000 [IU] | PEN_INJECTOR | Freq: Two times a day (BID) | SUBCUTANEOUS | Status: DC

## 2013-05-13 NOTE — Progress Notes (Signed)
S:    Patient arrives in good spirits presenting with his medications and blood glucose logbook. Presents for diabetes medication management. Patient was already taking Lantus (insulin glargine) 50 units in AM and  Victoza (liraglutide) 1.8 mg daily. He was recently initiated on Starlix (nateglinide) 120mg  TID with meals back in December of 2014 in addition to the previous medications all for the management of Type 2 diabetes. He has had a long standing history of diabetes. Patient reported having higher blood glucose levels after eating where he was seeing values in the 400s at times when checking blood glucose.     O:  . Lab Results  Component Value Date   HGBA1C 7.0 03/15/2013     Reported in his logbook with a high blood glucose 456 with a low blood glucose 170.   A/P: Longstanding history of Type 2 diabetes that has been uncontrolled through his elevated post-prandial blood glucose values and currently on Lantus (insulin glargine), Victoza (liraglutide), and Starlix (nateglinide).  Denies hypoglycemic events and is able to verbalize appropriate hypoglycemia management plan.  Reports adherence with medication.   Discontinue Starlix (nateglinide) as post-prandial glucose levels remained elevated since initation.  Initiate Novolog Flexpen (insulin aspart) 10-15 units with lunch and dinner. Titrate from 10 to 15 units based on food intake. Educate patient to administer 15-30 minutes prior to meals (lunch and dinner). Reinforce insulin injection technique and sites for administration. Reinforce what to do in hypoglycemic event. Decided to not administer morning dose of Novolog (insulin aspart) due to patient's exercise and breakfast routine as this point. Prior to end of visit, patient administered 5 units of Novolog (insulin aspart). Continue to monitor blood glucose levels.   Written patient instructions provided.  Follow up in Pharmacist Clinic Visit 3 to 4 weeks.   Total time in face to face  counseling 50 minutes.  Patient seen with Claudie LeachLauren Gray, PharmD Candidate, Anabel BeneSendra Yang, PharmD Resident  and Georga KaufmannJessica Binz, PharmD Resident..Marland Kitchen

## 2013-05-13 NOTE — Assessment & Plan Note (Signed)
Longstanding history of Type 2 diabetes that has been uncontrolled through his elevated post-prandial blood glucose values and currently on Lantus (insulin glargine), Victoza (liraglutide), and Starlix (nateglinide).  Denies hypoglycemic events and is able to verbalize appropriate hypoglycemia management plan.  Reports adherence with medication.   Discontinue Starlix (nateglinide) as post-prandial glucose levels remained elevated since initation.  Initiate Novolog Flexpen (insulin aspart) 10-15 units with lunch and dinner. Titrate from 10 to 15 units based on food intake. Educate patient to administer 15-30 minutes prior to meals (lunch and dinner). Reinforce insulin injection technique and sites for administration. Reinforce what to do in hypoglycemic event. Decided to not administer morning dose of Novolog (insulin aspart) due to patient's exercise and breakfast routine as this point. Prior to end of visit, patient administered 5 units of Novolog (insulin aspart). Continue to monitor blood glucose levels.   Written patient instructions provided.  Follow up in Pharmacist Clinic Visit 3 to 4 weeks.   Total time in face to face counseling 50 minutes.  Patient seen with Claudie LeachLauren Gray, PharmD Candidate, Anabel BeneSendra Yang, PharmD Resident  and Georga KaufmannJessica Binz, PharmD Resident..Marland Kitchen

## 2013-05-13 NOTE — Patient Instructions (Addendum)
It was great seeing you today!  We started Novolog (insulin aspart) to be given 15 to 30 minutes prior to your lunchtime and suppertime meals. Administer 10 to 15 units of Novolog (insulin aspart) before lunchtime and suppertime based on your meal intake. Stop using the Starlix (nateglinide).   Continue using your Lantus (insulin glargine) 50 units in the morning in addition to Victoza (liraglutide) 1.8mg  daily.   Continue to check your blood sugar levels and continue to write them down in your logbook.   Please follow up in 3 to 4 weeks with Dr. Raymondo BandKoval in pharmacy clinic.

## 2013-05-14 NOTE — Progress Notes (Signed)
Patient ID: Tanner Mason, male   DOB: Apr 04, 1941, 73 y.o.   MRN: [id number] Reviewed: Agree with Dr. Macky LowerKoval's documentation and management.

## 2013-05-23 ENCOUNTER — Other Ambulatory Visit: Payer: Self-pay | Admitting: Family Medicine

## 2013-05-24 ENCOUNTER — Other Ambulatory Visit: Payer: Self-pay | Admitting: Family Medicine

## 2013-05-25 ENCOUNTER — Ambulatory Visit (INDEPENDENT_AMBULATORY_CARE_PROVIDER_SITE_OTHER): Payer: 59 | Admitting: Family Medicine

## 2013-05-25 ENCOUNTER — Encounter: Payer: Self-pay | Admitting: Family Medicine

## 2013-05-25 VITALS — BP 124/75 | HR 73 | Ht 67.0 in | Wt 169.5 lb

## 2013-05-25 DIAGNOSIS — N183 Chronic kidney disease, stage 3 unspecified: Secondary | ICD-10-CM

## 2013-05-25 DIAGNOSIS — E1122 Type 2 diabetes mellitus with diabetic chronic kidney disease: Secondary | ICD-10-CM

## 2013-05-25 DIAGNOSIS — E876 Hypokalemia: Secondary | ICD-10-CM

## 2013-05-25 DIAGNOSIS — I10 Essential (primary) hypertension: Secondary | ICD-10-CM

## 2013-05-25 DIAGNOSIS — Z0000 Encounter for general adult medical examination without abnormal findings: Secondary | ICD-10-CM

## 2013-05-25 DIAGNOSIS — N138 Other obstructive and reflux uropathy: Secondary | ICD-10-CM

## 2013-05-25 DIAGNOSIS — E1129 Type 2 diabetes mellitus with other diabetic kidney complication: Secondary | ICD-10-CM

## 2013-05-25 DIAGNOSIS — N401 Enlarged prostate with lower urinary tract symptoms: Secondary | ICD-10-CM

## 2013-05-25 LAB — CBC WITH DIFFERENTIAL/PLATELET
Basophils Absolute: 0 10*3/uL (ref 0.0–0.1)
Basophils Relative: 0 % (ref 0–1)
Eosinophils Absolute: 0.2 10*3/uL (ref 0.0–0.7)
Eosinophils Relative: 3 % (ref 0–5)
HCT: 36.4 % — ABNORMAL LOW (ref 39.0–52.0)
Hemoglobin: 12.4 g/dL — ABNORMAL LOW (ref 13.0–17.0)
Lymphocytes Relative: 35 % (ref 12–46)
Lymphs Abs: 1.9 10*3/uL (ref 0.7–4.0)
MCH: 30.9 pg (ref 26.0–34.0)
MCHC: 34.1 g/dL (ref 30.0–36.0)
MCV: 90.8 fL (ref 78.0–100.0)
Monocytes Absolute: 0.5 10*3/uL (ref 0.1–1.0)
Monocytes Relative: 9 % (ref 3–12)
Neutro Abs: 2.8 10*3/uL (ref 1.7–7.7)
Neutrophils Relative %: 53 % (ref 43–77)
Platelets: 158 10*3/uL (ref 150–400)
RBC: 4.01 MIL/uL — ABNORMAL LOW (ref 4.22–5.81)
RDW: 15.3 % (ref 11.5–15.5)
WBC: 5.3 10*3/uL (ref 4.0–10.5)

## 2013-05-25 NOTE — Patient Instructions (Signed)
Diabetes - continue current regimen, return for check of A1C in one month  High Blood Pressure - no change to regimen  Low Potassium - check blood work today  Return to office one month for yearly physical, have lab work completed today

## 2013-05-26 ENCOUNTER — Telehealth: Payer: Self-pay | Admitting: Family Medicine

## 2013-05-26 DIAGNOSIS — E876 Hypokalemia: Secondary | ICD-10-CM

## 2013-05-26 LAB — BASIC METABOLIC PANEL
BUN: 27 mg/dL — ABNORMAL HIGH (ref 6–23)
CO2: 30 mEq/L (ref 19–32)
Calcium: 9.4 mg/dL (ref 8.4–10.5)
Chloride: 97 mEq/L (ref 96–112)
Creat: 1.84 mg/dL — ABNORMAL HIGH (ref 0.50–1.35)
Glucose, Bld: 95 mg/dL (ref 70–99)
Potassium: 3.1 mEq/L — ABNORMAL LOW (ref 3.5–5.3)
Sodium: 134 mEq/L — ABNORMAL LOW (ref 135–145)

## 2013-05-26 LAB — TSH: TSH: 1.531 u[IU]/mL (ref 0.350–4.500)

## 2013-05-26 LAB — PSA: PSA: 0.98 ng/mL (ref <=4.00)

## 2013-05-26 MED ORDER — POTASSIUM CHLORIDE ER 10 MEQ PO TBCR: 20.0000 meq | EXTENDED_RELEASE_TABLET | Freq: Every day | ORAL | Status: DC

## 2013-05-26 NOTE — Progress Notes (Signed)
   Subjective:    Patient ID: Tanner Mason, male    DOB: [birth year], 73 y.o.   MRN: [id number]  HPI 73 year old male presents for followup of multiple medical conditions.  Hypertension-patient currently on amlodipine 10 mg daily, hydrochlorothiazide 25 mg daily, and Lopressor 100 mg twice daily. Patient reports compliance with his medications, no side effects, no associated chest pain, vision changes, or headaches.  Insulin-dependent type 2 diabetes-patient is currently on Lantus 50 units daily, NovoLog 10-15 units with lunch and dinner depending on amount of carbohydrates, and Victoza. Patient was recently started on NovoLog by the pharmacy team. Patient reports that his fasting blood sugars are ranging from the 90s to 130s. Preprandial lunch and dinner sugars have ranged from mid 100s to low 200s and are much improved from previously. Patient reports visits to both the ophthalmologist and podiatrist within the past 12 months.  Peripheral neuropathy-patient reports continued numbness and feeling of "swelling "of bilateral bottom of feet, patient is currently on gabapentin 300 milligrams twice daily.  Hypokalemia-patient currently on 10 mEq of potassium daily  Social-reviewed social history in Epic  Review of Systems  Constitutional: Negative for fever, chills and fatigue.  Respiratory: Negative for cough, choking and shortness of breath.   Cardiovascular: Negative for chest pain, palpitations and leg swelling.  Gastrointestinal: Negative for vomiting, abdominal pain, diarrhea, constipation and abdominal distention.  Neurological: Positive for numbness.       Objective:   Physical Exam Vitals: Reviewed General: Pleasant Caucasian male, no acute distress HEENT: Normocephalic, pupils are equal round and reactive to light, extraocular movements are intact, no scleral icterus, nasal septum midline, moist mucous membranes, uvula midline, neck was supple, no anterior posterior cervical  lymphadenopathy Cardiac: Regular rate and rhythm, S1 and S2 present, no murmurs, no heaves or thrills Respiratory: Clear to auscultation bilaterally, normal effort Abdomen: Soft, nontender Extremities: No lower extremity edema, bilateral foot examination was unremarkable, 2 posterior cells pedis pulses noted bilaterally       Assessment & Plan:  Please see problem specific assessment and plan.

## 2013-05-26 NOTE — Assessment & Plan Note (Signed)
Creatinine remains stable based on most recent basic metabolic panel.

## 2013-05-26 NOTE — Assessment & Plan Note (Signed)
Patient due for recheck of BMP.

## 2013-05-26 NOTE — Assessment & Plan Note (Signed)
Glucoses appear under better control with addition of NovoLog prior to lunch and dinner. Patient will continue to check fasting and preprandial blood glucoses -No changes to pharmacotherapy made today -Patient to return to office in one month for recheck of hemoglobin A1c -Up-to-date on podiatry and ophthalmology appointments

## 2013-05-26 NOTE — Telephone Encounter (Signed)
Discussed lab results, Increased potassium to twice daily, will recheck potassium at next visit.

## 2013-05-26 NOTE — Assessment & Plan Note (Signed)
Well-controlled on current regimen -No changes to pharmacotherapy made today 

## 2013-05-26 NOTE — Assessment & Plan Note (Signed)
Patient to return to office in one month for physical exam. Will repeat DRE at that time. Patient's last PSA was in 2011 therefore will recheck PSA with lab work today.

## 2013-05-28 ENCOUNTER — Other Ambulatory Visit: Payer: Self-pay | Admitting: Family Medicine

## 2013-05-28 DIAGNOSIS — E876 Hypokalemia: Secondary | ICD-10-CM

## 2013-05-28 MED ORDER — OMEPRAZOLE 20 MG PO CPDR: 20.0000 mg | DELAYED_RELEASE_CAPSULE | Freq: Every day | ORAL | Status: DC

## 2013-05-28 MED ORDER — METOPROLOL TARTRATE 100 MG PO TABS: 100.0000 mg | ORAL_TABLET | Freq: Two times a day (BID) | ORAL | Status: DC

## 2013-05-28 MED ORDER — GABAPENTIN 600 MG PO TABS
300.0000 mg | ORAL_TABLET | Freq: Three times a day (TID) | ORAL | Status: DC
Start: 2013-05-28 — End: 2015-02-28

## 2013-05-28 MED ORDER — FINASTERIDE 5 MG PO TABS: 5.0000 mg | ORAL_TABLET | Freq: Every day | ORAL | Status: DC

## 2013-05-28 MED ORDER — POTASSIUM CHLORIDE ER 10 MEQ PO TBCR: 20.0000 meq | EXTENDED_RELEASE_TABLET | Freq: Every day | ORAL | Status: DC

## 2013-06-08 ENCOUNTER — Ambulatory Visit: Payer: 59 | Admitting: Pharmacist

## 2013-06-17 ENCOUNTER — Ambulatory Visit (INDEPENDENT_AMBULATORY_CARE_PROVIDER_SITE_OTHER): Payer: 59 | Admitting: Pharmacist

## 2013-06-17 ENCOUNTER — Encounter: Payer: Self-pay | Admitting: Pharmacist

## 2013-06-17 VITALS — BP 123/69 | HR 91 | Ht 67.5 in | Wt 171.4 lb

## 2013-06-17 DIAGNOSIS — E1129 Type 2 diabetes mellitus with other diabetic kidney complication: Secondary | ICD-10-CM

## 2013-06-17 NOTE — Progress Notes (Signed)
S:    Patient arrives in good spirits    Presents for diabetes follow up after staring bolus insulin in addition to lantus and Victoza on January 15th  Patient reports working out at the gym this AM and states his blood sugars are much improved.  Patient has taken novolog 15 units prior to most meals.   He determined that 10 units was not enough.    O:  . Lab Results  Component Value Date   HGBA1C 7.0 03/15/2013   home fasting CBG readings of 100- 145 2 hour post-prandial/random CBG readings of 88-240 with majority of readings 100- 150  A/P: Diabetes currently much improved with combination of basal - bolus insulin and liraglutide (Victoza).    Denies hypoglycemic events and is able to verbalize appropriate hypoglycemia management plan.  Reports adherence with medication.   Patient is meeting goals for blood glucose control at this time. No change at this time.   Discussed sliding scale of Novolog from 10-18 units depending on plans for the day.   Patient will continue to take 15 units routinely prior to each meal and look for opportunities to adjust PRN.   Reevaluate blood sugar control at next visit with Dr. Randolm IdolFletke in 8 days. Follow up in Pharmacist Clinic Visit in April if necessary (patient plans to be in [place] in March).   Total time in face to face counseling 30 minutes.  Patient seen with Anthony SarAlvin Oung, PhamD - Resident.  Hypertension - at goal today - If potassium remains low or BP remains a challenge, consider addition of low dose aldactone with HCTZ (this does come in combination pill) which could replace his HCTZ and potassium pills.    Consider repeat lipid panel with a1c and BMET next visit.  Last evaluation was direct LDL only in 05/2012

## 2013-06-17 NOTE — Patient Instructions (Addendum)
Thanks for visiting us today.  Great job - your sugars are looking much better than before. Continue using your Lantus 50 units once daily, Victoza 1.8 mg once daily, and Novolog 10-15 units three times daily with a meal. (Consider using 10 units during times of the day where you may be exercising more/ eating less)  Continue to watch your diet, keeping moderate sugars AND calories.  Keep up the good work.  Discuss with Dr. Randolm IdolFletke next week to see when you see us next.

## 2013-06-17 NOTE — Assessment & Plan Note (Signed)
Diabetes currently much improved with combination of basal - bolus insulin and liraglutide (Victoza).    Denies hypoglycemic events and is able to verbalize appropriate hypoglycemia management plan.  Reports adherence with medication.   Patient is meeting goals for blood glucose control at this time. No change at this time.   Discussed sliding scale of Novolog from 10-18 units depending on plans for the day.   Patient will continue to take 15 units routinely prior to each meal and look for opportunities to adjust PRN.   Reevaluate blood sugar control at next visit with Dr. Randolm IdolFletke in 8 days. Follow up in Pharmacist Clinic Visit in April if necessary (patient plans to be in [place] in March).   Total time in face to face counseling 30 minutes.  Patient seen with Anthony SarAlvin Oung, PhamD - Resident.

## 2013-06-17 NOTE — Progress Notes (Signed)
Patient ID: Tanner Mason, male   DOB: 06-Jul-1940, 73 y.o.   MRN: [id number] Reviewed: Agree with Dr. Macky LowerKoval's documentation and management.

## 2013-06-25 ENCOUNTER — Encounter: Payer: Self-pay | Admitting: Family Medicine

## 2013-06-25 ENCOUNTER — Ambulatory Visit (INDEPENDENT_AMBULATORY_CARE_PROVIDER_SITE_OTHER): Payer: 59 | Admitting: Family Medicine

## 2013-06-25 ENCOUNTER — Other Ambulatory Visit: Payer: Self-pay | Admitting: *Deleted

## 2013-06-25 VITALS — BP 136/77 | HR 80 | Ht 67.5 in | Wt 170.0 lb

## 2013-06-25 DIAGNOSIS — E876 Hypokalemia: Secondary | ICD-10-CM

## 2013-06-25 DIAGNOSIS — E1129 Type 2 diabetes mellitus with other diabetic kidney complication: Secondary | ICD-10-CM

## 2013-06-25 DIAGNOSIS — E663 Overweight: Secondary | ICD-10-CM

## 2013-06-25 DIAGNOSIS — N401 Enlarged prostate with lower urinary tract symptoms: Secondary | ICD-10-CM

## 2013-06-25 DIAGNOSIS — D649 Anemia, unspecified: Secondary | ICD-10-CM

## 2013-06-25 LAB — POCT GLYCOSYLATED HEMOGLOBIN (HGB A1C): Hemoglobin A1C: 6.2

## 2013-06-25 MED ORDER — LIRAGLUTIDE 18 MG/3ML ~~LOC~~ SOPN: 1.8000 mg | PEN_INJECTOR | Freq: Every day | SUBCUTANEOUS | Status: DC

## 2013-06-25 NOTE — Assessment & Plan Note (Signed)
Patient remains overweight. Encouraged patient to continue exercising. -Referral made to dietitian

## 2013-06-25 NOTE — Progress Notes (Signed)
   Subjective:    Patient ID: Tanner Mason, male    DOB: 30-Jun-1940, 73 y.o.   MRN: [id number]  HPI 73 year old male presents for followup of multiple medical conditions and preventative examination.  Insulin-dependent type 2 diabetes-patient currently on Lantus 50 units daily and NovoLog 15 units with meals, patient also currently on Victoza, due for recheck of hemoglobin A1c, fasting blood glucoses have been improved and range 110 to 170s, preprandial glucoses are mostly in the mid 100s however he does occasionally have some above 200, denies hypoglycemic episodes, up-to-date on eye and foot examinations, patient is exercising 5 days a week, he swims 2 days a week and lifts weights the other 3 days of the week, he does admit to food indiscretion  Hypokalemia-patient identified to be hypokalemic on last lab work, taking 20 mEq of potassium chloride daily, no side effects  Benign prostatic hypertrophy-patient currently on Proscar and Flomax, patient reports good urine stream, he reports awaking one to 2 times nightly to urinate, symptoms have improved as his diabetes has come under better control, no hesitancy, no dysuria  History of anemia - previously seen by hematologist, has previously received B12 injections however the last was approximately 2 years ago, patient also reports having previous bone marrow biopsy which was unremarkable, patient reports no fatigue, no lower extremity swelling  Preventative-patient is up-to-date on his colonoscopy which was last performed in 2014, patient recently had PSA which is outlined in the objective section, patient is up-to-date on his immunizations, patient follows with a dermatologist yearly  Reviewed social history, patient is a former smoker   Review of Systems  Constitutional: Negative for fever, chills and fatigue.  HENT: Negative for congestion and sinus pressure.   Respiratory: Negative for cough, choking and shortness of breath.     Cardiovascular: Negative for chest pain and leg swelling.  Genitourinary: Negative for dysuria, urgency, frequency, enuresis and testicular pain.       Objective:   Physical Exam Vitals: Reviewed General: Pleasant Caucasian male, no acute distress HEENT: Normocephalic, pupils are equal round and reactive to light, extraocular movements are intact, bilateral TMs are pearly-gray without exudate, nasal septum midline, no rhinorrhea, moist mucous membranes, uvula midline, no pharyngeal erythema or exudate noted, neck was supple, no anterior or posterior cervical lymphadenopathy Cardiac: Regular rate and rhythm, S1 and S2 present, no murmurs, no heaves or thrills Respiratory: Clear to auscultation bilaterally, normal effort Abdomen: Soft, nontender, bowel sounds present, no rebound, no guarding Extremities: No edema, 2+ radial pulses bilaterally, 2+ dorsalis pedis pulses bilaterally Neuro: Alert and oriented x3, strength 5 out of 5 in all extremities, straight grossly intact Skin: Multiple seborrheic keratoses on upper back, patient has scars on left for head and upper abdomen from previous biopsies for suspicious skin lesions, no new suspicious or abnormal lesions identified today on skin examination GU: Mildly enlarged prostate, no nodules, good rectal tone  Reviewed recent lab work. PSA within normal limits. BMP identified patient to be hypokalemic. Creatinine elevated to 1.8 however improved from previous examination. CBC within normal limits. TSH within normal limits.     Assessment & Plan:  Please see problem specific assessment and plan.

## 2013-06-25 NOTE — Assessment & Plan Note (Signed)
Recheck potassium level. -Titrate Klor-Con based on potassium level.

## 2013-06-25 NOTE — Assessment & Plan Note (Signed)
Patient currently asymptomatic. PSA within normal limits. Digital rectal examination identified slightly enlarged prostate without gross nodule. -Continue Flomax and Proscar

## 2013-06-25 NOTE — Patient Instructions (Addendum)
Make an appointment with Tanner Mason for Nutrition evaluation.   Low Potassium - check lab work today  Diabetes - continue current dose of Lantus and Novolog, check A1C  Kidney Function - improved from previous, recheck lab work today  Up to date on your immunizations, due for Tetanus shot in one year  Scheduled appointment with Tanner Mason for Medicare annual visit.

## 2013-06-25 NOTE — Assessment & Plan Note (Signed)
Diabetes appears well controlled. Check A1c today. -Continue current treatment regimen

## 2013-06-25 NOTE — Assessment & Plan Note (Signed)
Hemoglobin improved from previous evaluation. Continue to monitor periodically.

## 2013-06-26 LAB — BASIC METABOLIC PANEL
BUN: 24 mg/dL — ABNORMAL HIGH (ref 6–23)
CO2: 29 mEq/L (ref 19–32)
Calcium: 9.1 mg/dL (ref 8.4–10.5)
Chloride: 103 mEq/L (ref 96–112)
Creat: 1.86 mg/dL — ABNORMAL HIGH (ref 0.50–1.35)
Glucose, Bld: 139 mg/dL — ABNORMAL HIGH (ref 70–99)
Potassium: 3.6 mEq/L (ref 3.5–5.3)
Sodium: 141 mEq/L (ref 135–145)

## 2013-06-28 ENCOUNTER — Encounter: Payer: Self-pay | Admitting: Home Health Services

## 2013-06-28 ENCOUNTER — Ambulatory Visit (INDEPENDENT_AMBULATORY_CARE_PROVIDER_SITE_OTHER): Payer: 59 | Admitting: Home Health Services

## 2013-06-28 ENCOUNTER — Telehealth: Payer: Self-pay | Admitting: Family Medicine

## 2013-06-28 VITALS — BP 120/71 | HR 80 | Temp 97.9°F | Ht 67.0 in | Wt 169.0 lb

## 2013-06-28 DIAGNOSIS — Z0000 Encounter for general adult medical examination without abnormal findings: Secondary | ICD-10-CM

## 2013-06-28 NOTE — Telephone Encounter (Signed)
Patient called back to discuss lab results. We discussed improvement of Hemoglobin A1C and BMP. All questions answered.

## 2013-06-28 NOTE — Progress Notes (Signed)
Patient here for annual wellness visit, patient reports: Risk Factors/Conditions needing evaluation or treatment: Pt does not have any new risk factors that need evaluation. Home Safety: Pt lives with wife.  Reports having smoke detectors in the home. Other Information: Corrective lens: Pt wears daily corrective lens.  Pt has annual eye exams. Dentures: Pt does not have dentures.  Pt has annual dental exmas. Memory: Pt denies any memory problems. Patient's Mini Mental Score (recorded in doc. flowsheet): 30 Pt denies any hearing or vision changes/problems.  Balance/Gait: Pt has not had any falls in the past 12 months.   We discussed ways to reduce falls at home.  ADLS/IADLS:  Pt reports not problems with self-care and home management. BMI: 26.47  We discussed portion control and simple carb exchange.  Pt will schedule appointment with nutritionist. Pain screening:  Pt reports not pain at this time.   Physical Activity:  [facility] a [facility].  Bladder control:  Pt is currently on medications for bladder control.  Medication Adherence:  We discussed important of medication adherence for DM, HTN, and cholesterol.  Pt reports missing 0 days of medications over the past month.     Annual Wellness Visit Requirements Recorded Today In  Medical, family, social history Past Medical, Family, Social History Section  Current providers Care team  Current medications Medications  Wt, BP, Ht, BMI Vital signs  Tobacco, alcohol, illicit drug use History  ADL Nurse Assessment  Depression Screening Nurse Assessment  Cognitive impairment Nurse Assessment  Mini Mental Status Document Flowsheet  Fall Risk Fall/Depression  Home Safety Progress Note  End of Life Planning (welcome visit) Social Documentation  Medicare preventative services Progress Note  Risk factors/conditions needing evaluation/treatment Progress Note  Personalized health advice Patient Instructions, goals,  letter  Diet & Exercise Social Documentation  Emergency Contact Social Documentation  Seat Belts Social Documentation  Sun exposure/protection Social Documentation

## 2013-06-29 ENCOUNTER — Telehealth: Payer: Self-pay | Admitting: Family Medicine

## 2013-06-29 ENCOUNTER — Other Ambulatory Visit: Payer: Self-pay | Admitting: Family Medicine

## 2013-06-29 DIAGNOSIS — E1129 Type 2 diabetes mellitus with other diabetic kidney complication: Secondary | ICD-10-CM

## 2013-06-29 NOTE — Telephone Encounter (Signed)
Referral for Nutrition made in [facility].

## 2013-07-26 ENCOUNTER — Encounter: Payer: Self-pay | Admitting: Family Medicine

## 2013-07-26 ENCOUNTER — Ambulatory Visit (INDEPENDENT_AMBULATORY_CARE_PROVIDER_SITE_OTHER): Payer: 59 | Admitting: Family Medicine

## 2013-07-26 VITALS — Ht 67.0 in | Wt 170.2 lb

## 2013-07-26 DIAGNOSIS — E1129 Type 2 diabetes mellitus with other diabetic kidney complication: Secondary | ICD-10-CM

## 2013-07-26 NOTE — Patient Instructions (Signed)
   Diet Recommendations for Diabetes  - ONE carb exchange = 15 grams of carbohydrate.    Starchy (carb) foods include: Bread, rice, pasta, potatoes, corn, crackers, bagels, muffins, all baked goods.  (Fruits, milk, and yogurt also have carbohydrate, but most of these foods will not spike your blood sugar as the starchy foods will.)  A few fruits do cause high blood sugars; use small portions of bananas (limit to 1/2 at a time), grapes, and most tropical fruits.    Protein foods include: Meat, fish, poultry, eggs, dairy foods, and beans such as pinto and kidney beans (beans also provide carbohydrate).   1. Eat at least 3 meals and 1-2 snacks per day. Never go more than 4-5 hours while awake without eating.  2. Limit starchy foods to TWO per meal and ONE per snack. ONE portion of a starchy  food is equal to the following:   - ONE slice of bread (or its equivalent, such as half of a hamburger bun).   - 1/2 cup of a "scoopable" starchy food such as potatoes or rice.   - 15 grams of carbohydrate as shown on food label.   - YOUR BEVERAGES COUNT AS YOUR CARB CHOICES.  12 oz soda = 40 g carb; 12 oz beer = ~15 g carb; 4 oz of juice = 15 grams of carb.   - Aim for half unsweet tea with your sweet tea, and work to wean yourself down to less sugar over time.   3. Both lunch and dinner should include a protein food, a carb food, and vegetables.   - Obtain twice as many veg's as protein or carbohydrate foods for both lunch and dinner.   - Fresh or frozen veg's are best.   - Try to keep frozen veg's on hand for a quick vegetable serving.    4. Breakfast should always include protein.    - Breakfast cereals:  Choose one with at least 5 grams of fiber per serving.  High-fiber cereals include all the BRAN cereals, Frosted Mini-Wheats, All Bran Original, Shredded Wheat or Shredded Wheat 'n Bran.  Suggestion:  Mix Mini-Wheats with unfrosted.   - TASTE PREFERENCES ARE LEARNED.  This means that it will get easier to  choose foods you know are good for you if you are exposed to them enough.   - Make three lists of vegetables: (1) those you like and eat now; (2) vegetables you won't even consider; and (3) vegetables you might consider trying if they are prepared a certain way.  Continue to eat veg's you currently eat, but from this last list, choose a vegetable to try at least 3 times a week.  Use small amounts of this vegetable, cut small, combined with foods or seasonings you like.

## 2013-07-26 NOTE — Progress Notes (Signed)
Medical Nutrition Therapy:  Appt start time: 1100 end time:  1200.  Assessment:  Primary concerns today: Blood sugar control.  [name] was accompanied by his wife today.  He had some DSMT when first diagnosed with DM, but neither of them remembered what represents one carb exchange.  FBG has been ~150's; was down around 120's soon after he'd seen Dr. Raymondo BandKoval in Feb, but has been rising since.   Usual eating pattern includes 3 meals and 0-2 snacks per day. Usual physical activity includes [employer] 1 hr of elliptical 20 min & swimming 20-25 min 2 X wk, and wts 3 X wk.  Frequent foods include meats, 1-2 c black coffee, 16-14 oz sweet tea, regular soda 3-4 X wk, 1-2 beers 3-4 X wk, candy ~2 X wk.  Avoided foods include most salads.  Often split a meal if going out to eat.  Usually eats out one meal a day.  [name] does not like a lot of veg's.  Had a long discussion re. taste preferences being learned, and recommended he learn to expand his palate to include more veg's (see pt instrxns).   24-hr recall: (Up at 8:30 AM) B (8:30 AM)-   1 fried egg in spray, 1 slc toast, jelly & marg, grapefruit, coffee, 8 oz 1% milk   Snk ( AM)-   none L (2 PM)-  [employer] chx sandwich (350 kcal), 1 c chili, diet soda, 1/2 Jr Frosty (95 kcal) Snk ( PM)-  none D (6:30 PM)-  1 slc pizza, Crystal Light Snk ( PM)-  1 orange  Progress Towards Goal(s):  In progress.   Nutritional Diagnosis:  NI-5.8.2 Excessive carbohydrate intake As related to beverages primarily.  As evidenced by usual intake of sweet tea and soda.    Intervention:  Nutrition education.  Monitoring/Evaluation:  Dietary intake, exercise, BG, and body weight in 4 week(s).

## 2013-08-05 ENCOUNTER — Other Ambulatory Visit: Payer: Self-pay

## 2013-08-11 ENCOUNTER — Other Ambulatory Visit: Payer: Self-pay | Admitting: *Deleted

## 2013-08-11 MED ORDER — GLUCOSE BLOOD VI STRP: ORAL_STRIP | Status: DC

## 2013-08-17 ENCOUNTER — Other Ambulatory Visit: Payer: Medicare Other

## 2013-08-17 DIAGNOSIS — E785 Hyperlipidemia, unspecified: Secondary | ICD-10-CM

## 2013-08-17 LAB — LIPID PANEL
Cholesterol: 88 mg/dL (ref 0–200)
HDL: 26 mg/dL — ABNORMAL LOW (ref >39)
LDL Cholesterol: 5 mg/dL (ref 0–99)
Total CHOL/HDL Ratio: 3.4 Ratio
Triglycerides: 284 mg/dL — ABNORMAL HIGH (ref <150)
VLDL: 57 mg/dL — ABNORMAL HIGH (ref 0–40)

## 2013-08-17 NOTE — Progress Notes (Signed)
FLP DONE TODAY Tanner Mason 

## 2013-08-19 ENCOUNTER — Other Ambulatory Visit: Payer: Self-pay | Admitting: *Deleted

## 2013-08-19 ENCOUNTER — Encounter: Payer: Self-pay | Admitting: Family Medicine

## 2013-08-20 MED ORDER — INSULIN PEN NEEDLE 32G X 4 MM MISC: Status: DC

## 2013-08-26 ENCOUNTER — Encounter: Payer: Self-pay | Admitting: Family Medicine

## 2013-08-26 ENCOUNTER — Ambulatory Visit (INDEPENDENT_AMBULATORY_CARE_PROVIDER_SITE_OTHER): Payer: 59 | Admitting: Family Medicine

## 2013-08-26 VITALS — Ht 67.0 in | Wt 167.7 lb

## 2013-08-26 DIAGNOSIS — E1129 Type 2 diabetes mellitus with other diabetic kidney complication: Secondary | ICD-10-CM

## 2013-08-26 NOTE — Patient Instructions (Addendum)
-   Off to a great start!  - Exercise heart rate range for you (220-age = 149 = max HR) will be 97-119 (65-80% of your maximal effort). - Going for a 30-60 min moderate walk following your biggest meal of the day will help lower your blood sugar and your triglycerides as well as help raise your HDL.    Watch this video:  [email or web address] - [facility].    - Taste preferences are more quickly changed if the food you are trying to learn to like is eaten more often / more consistently.    Starchy (carb) foods include: Bread, rice, pasta, potatoes, corn, crackers, bagels, muffins, all baked goods.  (Fruits, milk, and yogurt also have carbohydrate, but most of these foods will not spike your blood sugar as the starchy foods will.)  A few fruits do cause high blood sugars; use small portions of bananas (limit to 1/2 at a time), grapes, and most tropical fruits.   Also be aware that many beverages include sugar/starch:  For every 15 grams of carb from beverages, you need to count a carb.    Protein foods include: Meat, fish, poultry, eggs, dairy foods, and beans such as pinto and kidney beans (beans also provide carbohydrate).   1. Eat at least 3 meals and 1-2 snacks per day. Never go more than 4-5 hours while awake without eating.  2. Limit starchy foods to TWO per meal and ONE per snack. ONE portion of a starchy  food is equal to the following:   - ONE slice of bread (or its equivalent, such as half of a hamburger bun).   - 1/2 cup of a "scoopable" starchy food such as potatoes or rice.   - 15 grams of carbohydrate as shown on food label.  3. Iinclude at every meal: a protein food, a carb food, and vegetables and/or fruit.   - Obtain twice as many veg's as protein or carbohydrate foods for both lunch and dinner.   - Fresh or frozen veg's are best.   - Try to keep frozen veg's on hand for a quick vegetable serving.

## 2013-08-26 NOTE — Progress Notes (Signed)
Medical Nutrition Therapy:  Appt start time: 1330 end time:  1430.  Assessment:  Primary concerns today: Blood sugar control.  [name] has been checking FBG and occasional other BG.  He has had 6-8 hypoglycemic events (58, 67, 87) in the past month.  Last hypoglycemic event was at 69 at 11:30 AM, following working out on April 14.  He is having fewer low BG as he learns better how to adjust his Novolog.  He has cut back on Novolog; now using 6-10 units/meal, down from 10-15/meal.  FBG have been 76-163; most have been 109-130, much better than previously.  FBG this morning was 123; used 6 u Novolog with breakfast, 10 u with lunch.  Leonette MostCharles has cut back to half-sweet tea, and is drinking regular soda only 1-2 X wk now.    Leonette MostCharles did his list of veg's, and found that there are more than he thought of ones he likes.  He is getting entree salads usually a couple times a week, and although veg's are not included every day, he is getting more than he used to.   Exercise includes 30-35 swimming 2 X wk, ~20 min weights 3 X wk, and 20 min elliptical 3 X wk, 45 min lawn mowing 1 X wk.  We discussed increasing his exercise some to help boost his LDL, which was only 26 on 5/78/464/21/15 (and lower TG and BG).    When asked about limiting starchy foods, neither Leonette Mostharles or his wife could remember the recommendation of no more than 2 carb's per meal, and they could not remember what represents one carb.  I emphasized the importance of this recommendation for BG control.    24-hr recall:  (Up at 7:30 AM) B (7:45 AM)-  1 c Frosted Mini-Wheats, 1 c 1% milk Snk (11 AM)-  1 c black coffee L (12 PM)-  1 bbq sandwich, slaw, sweet pot fries, 12 oz beer Snk ( PM)-  none D (7:30 PM)-  1 caramel rice cake peanut butter, 12 oz 1% milk, 2 GS pb cookies Snk ( PM)-  none  Progress Towards Goal(s):  In progress.   Nutritional Diagnosis:  NI-5.8.2 Excessive carbohydrate intake As related to beverages primarily.  As evidenced  by usual intake of sweet tea and soda.    Intervention:  Nutrition education.  Monitoring/Evaluation:  Dietary intake, exercise, BG, and body weight in 4 week(s).

## 2013-09-12 ENCOUNTER — Other Ambulatory Visit: Payer: Self-pay | Admitting: Family Medicine

## 2013-09-15 ENCOUNTER — Other Ambulatory Visit: Payer: Self-pay | Admitting: *Deleted

## 2013-09-15 DIAGNOSIS — E876 Hypokalemia: Secondary | ICD-10-CM

## 2013-09-16 MED ORDER — POTASSIUM CHLORIDE ER 10 MEQ PO TBCR
20.0000 meq | EXTENDED_RELEASE_TABLET | Freq: Every day | ORAL | Status: DC
Start: ? — End: 2014-08-11

## 2013-10-07 ENCOUNTER — Other Ambulatory Visit: Payer: Self-pay | Admitting: Family Medicine

## 2013-10-18 ENCOUNTER — Encounter: Payer: Self-pay | Admitting: Home Health Services

## 2013-10-18 ENCOUNTER — Encounter: Payer: Self-pay | Admitting: Family Medicine

## 2013-10-18 ENCOUNTER — Ambulatory Visit (INDEPENDENT_AMBULATORY_CARE_PROVIDER_SITE_OTHER): Payer: 59 | Admitting: Family Medicine

## 2013-10-18 VITALS — Ht 67.0 in | Wt 163.6 lb

## 2013-10-18 DIAGNOSIS — E1129 Type 2 diabetes mellitus with other diabetic kidney complication: Secondary | ICD-10-CM

## 2013-10-18 NOTE — Progress Notes (Signed)
Patient ID: Tanner Mason, male   DOB: 1940-05-30, 73 y.o.   MRN: [id number] I have reviewed this encounter performed by Arlys JohnSuzanne Lineberry and agree with her documentation.

## 2013-10-18 NOTE — Progress Notes (Signed)
Medical Nutrition Therapy:  Appt start time: 1330 end time:  1430.  Assessment:  Primary concerns today: Blood sugar control.  [name] feels he has been getting more veg's, although not as many as he should.  Both he and his wife are making more of an effort in food choices; getting more fruit, less juice; eating at home more; snacking on carrots & celery.  Swimming 45 min 2 X wk most weeks, and walking ~30 min after dinner 2-3 X wk, and 15-20 min elliptical and resistance ex ~35 min 3 X wk.  [name] got a few swim lessons, which have been helpful. On elliptical HRex has been 100-108 most days.   FBG have been 119-140.  On most days, he has been using 10-15 u Novolog 2 X day.  [name] has not remembered to limit starches to two per meal (see last night's dinner of ~5 carb's), which I again emphasized.    He and his wife feel they have learned a lot, and are prepared to continue working on diet.  Will call if they have Qs or wish to schedule a f/u appt.    24-hr recall:  (Up at 8 AM) B (8:30 AM)-  1 fried egg, 1 slc cinnamon toast, 2 slc bacon, 4 oz juice, 1/2 c pineapple, 1 c coffee Snk ( AM)-   L (1 PM)-  1 chx sandwich on sandw round, 1 slc cheese, mustard, handful chips, 1/2 c pineapple, diet sweet tea Snk ( PM)-  carrots, celery D (7 PM)-  1 chsburger, let, must on bun, 1/2 c baked beans, 24 oz light beer Snk ( PM)-  none More typical breakfast is Honey Nut Cheerios with Frosted Mini-wheats with almond milk.    Progress Towards Goal(s):  In progress.   Nutritional Diagnosis: Progress noted on NI-5.8.2 Excessive carbohydrate intake As related to beverages primarily.  As evidenced by intake of sweet tea and soda limited to only occasional soda and sweet tea is all either diet or half unsweetened.    Intervention:  Nutrition education.  Monitoring/Evaluation:  Dietary intake, exercise, BG, and body weight prn.

## 2013-10-18 NOTE — Patient Instructions (Addendum)
-   Increase vegetables.  - A good starch source (relatively low-glycemic) is beets:  Try roasting with onion and garlic at 400 degrees.  (Add some non-starchy vegetables half-way through cooking time.)   - Set aside a few minutes each week (same time) to plan 2-4 meals you'll have this week, which will include some (new) veg's prepared in new ways.  This planning should include making a grocery list, which days you'll have these meals.    - You may want to search on the internet for recipes and ideas, i.e., cold soups like gazpacho.    - Advanced planning will be key to following through with more vegetables and balanced meals.    - LIMIT STARCHES TO TWO PER MEAL FOR BEST BG CONTROL.   - If your fasting blood sugar is up around 140, stop to figure out what:  What did you last eat?; how much, and what time?

## 2013-11-06 ENCOUNTER — Other Ambulatory Visit: Payer: Self-pay | Admitting: Family Medicine

## 2013-11-08 ENCOUNTER — Other Ambulatory Visit: Payer: Self-pay | Admitting: *Deleted

## 2013-11-08 MED ORDER — INSULIN GLARGINE 100 UNIT/ML SOLOSTAR PEN: 50.0000 [IU] | PEN_INJECTOR | Freq: Every day | SUBCUTANEOUS | Status: DC

## 2013-11-08 NOTE — Telephone Encounter (Signed)
Request for 90 day supply. Tehila Sokolow L, RN  

## 2013-12-02 ENCOUNTER — Telehealth: Payer: Self-pay | Admitting: Family Medicine

## 2013-12-02 NOTE — Telephone Encounter (Signed)
Pt called and needs a refill on his Novolog called in. jw

## 2013-12-03 MED ORDER — INSULIN ASPART 100 UNIT/ML FLEXPEN: PEN_INJECTOR | SUBCUTANEOUS | Status: DC

## 2013-12-03 NOTE — Telephone Encounter (Signed)
Refill sent to pharmacy, patient notified.

## 2013-12-14 ENCOUNTER — Encounter: Payer: Self-pay | Admitting: Family Medicine

## 2013-12-14 ENCOUNTER — Ambulatory Visit (INDEPENDENT_AMBULATORY_CARE_PROVIDER_SITE_OTHER): Payer: 59 | Admitting: Family Medicine

## 2013-12-14 ENCOUNTER — Ambulatory Visit (HOSPITAL_COMMUNITY)
Admission: RE | Admit: 2013-12-14 | Discharge: 2013-12-14 | Disposition: A | Discharge status: Home / Self Care | Payer: 59 | Source: Ambulatory Visit | Attending: Family Medicine | Admitting: Family Medicine

## 2013-12-14 VITALS — BP 134/76 | HR 85 | Temp 98.0°F | Wt 160.0 lb

## 2013-12-14 DIAGNOSIS — G609 Hereditary and idiopathic neuropathy, unspecified: Secondary | ICD-10-CM

## 2013-12-14 DIAGNOSIS — E876 Hypokalemia: Secondary | ICD-10-CM

## 2013-12-14 DIAGNOSIS — E1129 Type 2 diabetes mellitus with other diabetic kidney complication: Secondary | ICD-10-CM

## 2013-12-14 DIAGNOSIS — R079 Chest pain, unspecified: Secondary | ICD-10-CM | POA: Diagnosis present

## 2013-12-14 DIAGNOSIS — R0789 Other chest pain: Secondary | ICD-10-CM | POA: Insufficient documentation

## 2013-12-14 DIAGNOSIS — I10 Essential (primary) hypertension: Secondary | ICD-10-CM

## 2013-12-14 HISTORY — DX: Other chest pain: R07.89

## 2013-12-14 LAB — BASIC METABOLIC PANEL
BUN: 29 mg/dL — ABNORMAL HIGH (ref 6–23)
CO2: 27 mEq/L (ref 19–32)
Calcium: 9.1 mg/dL (ref 8.4–10.5)
Chloride: 100 mEq/L (ref 96–112)
Creat: 1.76 mg/dL — ABNORMAL HIGH (ref 0.50–1.35)
Glucose, Bld: 206 mg/dL — ABNORMAL HIGH (ref 70–99)
Potassium: 3.7 mEq/L (ref 3.5–5.3)
Sodium: 137 mEq/L (ref 135–145)

## 2013-12-14 LAB — POCT GLYCOSYLATED HEMOGLOBIN (HGB A1C): Hemoglobin A1C: 6

## 2013-12-14 NOTE — Assessment & Plan Note (Signed)
Controlled. A1C 6.0 today. -no changes to therapy today -foot exam normal today -patient to follow up with ophthalmologist in december

## 2013-12-14 NOTE — Patient Instructions (Addendum)
Diabetes - well controlled, continue current regimen, continue to eat a balanced diet  Blood pressure - well controlled, no changes today  Low potassium - check level today  Chest pain - you have been referred to cardiology for further evaluation of your chest discomfort

## 2013-12-14 NOTE — Assessment & Plan Note (Signed)
Well-controlled on current regimen. ?

## 2013-12-14 NOTE — Progress Notes (Signed)
   Subjective:    Patient ID: Tanner Mason, male    DOB: June 02, 1940, 73 y.o.   MRN: [id number]  HPI 73 y/o male presents for diabetes follow up.  Type 2 diabetes - currently on Lantus 50 units daily, Victoza, and Novolog 10-15 units before meals TID (amount taken depends on meal size/carbs), no hypoglycemic episodes, fasting blood sugars elevated in the past month (ranging 160's to low 200's), does not check preprandial sugars, was previously seen by Nutritionist and reported better eating habits, he has began to slip in the past 1-2 months which he thinks is related to elevated blood sugars, [facility], last eye exam was in 03/2013  Peripheral Neuropathy - stable on current dose of Gabapentin, occasionally worsening loss of sensation, no pain  HTN - curently on HCTZ and Lopressor, no chest pain, no headaches, no vision changes  Chest pain - occasional dull ache left side of chest, no radiation, no associated sob/nausea/emesis, no relation to exertion, no current chest pain, last episode was 2 days ago, lasted 30 minutes and resolved spontaneously, has never seen a cardiologist  Social - former smoker  Review of Systems  Constitutional: Negative for fever, chills and fatigue.  Respiratory: Positive for chest tightness. Negative for choking and shortness of breath.        Objective:   Physical Exam Vitals:reviewed Gen: pleasant male, NAD HEENT: normocephalic, PERRL, EOMI, no scleral icterus, nasal septum midline, MMM, uvula midline, neck supple Cards: RRR, S1 and S2 present, no murmurs, no heaves/thrills Resp: CTAB, normal effort Ext: no edema, 2+ radial and DP pulses Skin: bilateral foot exam wnl  EKG - NSR, no ischemic changes    Assessment & Plan:  Please see problem specific assessment and plan.

## 2013-12-14 NOTE — Assessment & Plan Note (Addendum)
Intermittent atypical chest pain. No current symptoms. EKG unremarkable. -with history of diabetes and age greater than 3965 will refer to cardiology for further evaluation and possible stress test -already on ASA, BB, and Statin

## 2013-12-14 NOTE — Assessment & Plan Note (Signed)
Stable on current regimen -patient given permission to increase gabapentin dose to 600 TID if he has worsening symptoms

## 2013-12-15 ENCOUNTER — Telehealth: Payer: Self-pay | Admitting: Family Medicine

## 2013-12-15 NOTE — Telephone Encounter (Signed)
Discussed lab results.

## 2013-12-21 ENCOUNTER — Other Ambulatory Visit: Payer: Self-pay | Admitting: Family Medicine

## 2013-12-28 ENCOUNTER — Other Ambulatory Visit: Payer: Self-pay | Admitting: *Deleted

## 2013-12-28 MED ORDER — LIRAGLUTIDE 18 MG/3ML ~~LOC~~ SOPN: 1.8000 mg | PEN_INJECTOR | Freq: Every day | SUBCUTANEOUS | Status: DC

## 2014-01-04 ENCOUNTER — Other Ambulatory Visit: Payer: Self-pay | Admitting: Family Medicine

## 2014-01-10 ENCOUNTER — Ambulatory Visit
Admission: RE | Admit: 2014-01-10 | Discharge: 2014-01-10 | Disposition: A | Discharge status: Home / Self Care | Payer: Medicare Other | Source: Ambulatory Visit | Attending: Cardiology | Admitting: Cardiology

## 2014-01-10 ENCOUNTER — Encounter: Payer: Self-pay | Admitting: Cardiology

## 2014-01-10 ENCOUNTER — Ambulatory Visit (INDEPENDENT_AMBULATORY_CARE_PROVIDER_SITE_OTHER): Payer: Medicare Other | Admitting: Cardiology

## 2014-01-10 VITALS — BP 124/64 | HR 76 | Ht 67.0 in | Wt 166.0 lb

## 2014-01-10 DIAGNOSIS — R0789 Other chest pain: Secondary | ICD-10-CM

## 2014-01-10 DIAGNOSIS — IMO0001 Reserved for inherently not codable concepts without codable children: Secondary | ICD-10-CM

## 2014-01-10 DIAGNOSIS — I10 Essential (primary) hypertension: Secondary | ICD-10-CM

## 2014-01-10 DIAGNOSIS — E1165 Type 2 diabetes mellitus with hyperglycemia: Secondary | ICD-10-CM

## 2014-01-10 DIAGNOSIS — R079 Chest pain, unspecified: Secondary | ICD-10-CM

## 2014-01-10 NOTE — Patient Instructions (Signed)
A chest x-ray takes a picture of the organs and structures inside the chest, including the heart, lungs, and blood vessels. This test can show several things, including, whether the heart is enlarges; whether fluid is building up in the lungs; and whether pacemaker / defibrillator leads are still in place. Tanner Mason AT [facility]  Your physician has requested that you have an echocardiogram. Echocardiography is a painless test that uses sound waves to create images of your heart. It provides your doctor with information about the size and shape of your heart and how well your heart's chambers and valves are working. This procedure takes approximately one hour. There are no restrictions for this procedure.  Your physician has requested that you have an exercise tolerance test. For further information please visit [email or web address] Please also follow instruction sheet, as given.  FOLLOW UP AS NEEDED

## 2014-01-10 NOTE — Progress Notes (Signed)
Tanner Mason Date of Birth:  March 21, 1941 [facility] [address] [place], [place]  [address] [phone]        Fax   [phone]   History of Present Illness: This pleasant 73 year old gentleman is seen at the request of [facility].  The patient is seen because of chest pain.  The patient has atypical chest pain.  It occurs about every week or 2.  It is localized to the left upper chest.  It lasts about 1 minute.  It is not related to activity.  It does not radiate down his arms or through to his back.  It is not associated with any nausea vomiting or diaphoresis.  Generally the patient exercises on a regular basis.  He goes to the [facility] 5 days a week.  He does feel up to call 3 days a week and he swims 2 days a week. He has risk factors for ischemic heart disease.  He is a former smoker but has not smoked for more than 30 years.  He has hypercholesterolemia on Lipitor.  He has diabetes and mild renal insufficiency.  He has been treated for hypertension for the past 2 years. His family history reveals that his father died of a abdominal aortic aneurysm in his 56s.  His mother died of dementia.  He has a brother who had what sounds like aortic stenosis requiring surgery. His last chest x-ray was in 2009.  Current Outpatient Prescriptions  Medication Sig Dispense Refill  . amLODipine (NORVASC) 10 MG tablet Take 10 mg by mouth daily.      Marland Kitchen aspirin (BAYER CHILDRENS ASPIRIN) 81 MG chewable tablet Chew 81 mg by mouth daily.        Marland Kitchen atorvastatin (LIPITOR) 40 MG tablet TAKE ONE TABLET BY MOUTH ONE TIME DAILY   90 tablet  1  . EPINEPHrine (EPIPEN 2-PAK) 0.3 mg/0.3 mL DEVI Inject 0.3 mLs (0.3 mg total) into the skin once. Inject 1 pen intramuscularly as directed.  1 Device  1  . finasteride (PROSCAR) 5 MG tablet TAKE ONE TABLET BY MOUTH ONE TIME DAILY   90 tablet  0  . gabapentin (NEURONTIN) 600 MG tablet Take 0.5 tablets (300 mg total)  by mouth 3 (three) times daily.  270 tablet  1  . glucose blood (FREESTYLE TEST STRIPS) test strip Test three-four times daily  100 each  1  . hydrochlorothiazide (HYDRODIURIL) 25 MG tablet Take one-half tablet by mouth daily  90 tablet  1  . insulin aspart (NOVOLOG FLEXPEN) 100 UNIT/ML FlexPen Inject 10-15 units into the the skin three times daily before meals  5 pen  2  . Insulin Glargine (LANTUS SOLOSTAR) 100 UNIT/ML Solostar Pen Inject 50 Units into the skin daily.  15 mL  4  . Insulin Pen Needle (BD PEN NEEDLE NANO U/F) 32G X 4 MM MISC Use as instructed by your physician to check blood sugars.  100 each  1  . Liraglutide (VICTOZA) 18 MG/3ML SOPN Inject 1.8 mg into the skin daily. Please dispense one month supply (3 pens) per month supply.  3 pen  5  . loratadine (CLARITIN) 10 MG tablet Take 10 mg by mouth daily.        . metoprolol (LOPRESSOR) 100 MG tablet TAKE ONE TABLET BY MOUTH TWICE DAILY   180 tablet  0  . omeprazole (PRILOSEC) 20 MG capsule Take 1 capsule (20 mg total) by mouth daily.  90 capsule  1  . potassium chloride (KLOR-CON 10) 10 MEQ tablet Take 2 tablets (20 mEq total) by mouth daily.  180 tablet  1  . tamsulosin (FLOMAX) 0.4 MG CAPS capsule TAKE ONE CAPSULE BY MOUTH ONE TIME DAILY   90 capsule  1   No current facility-administered medications for this visit.    Allergies  Allergen Reactions  . Ace Inhibitors Other (See Comments)    REACTION: Angioedema  . Calcium Carbonate Antacid Other (See Comments)    REACTION: Angioedema of mouth    Patient Active Problem List   Diagnosis Date Noted  . Atypical chest pain 12/14/2013  . Hypokalemia 03/16/2013  . Overweight (BMI 25.0-29.9) 09/15/2012  . Stage 3 chronic renal impairment associated with type 2 diabetes mellitus 07/20/2010  . ULNAR NERVE ENTRAPMENT, RIGHT 06/07/2008  . DIABETES MELLITUS, TYPE II, CONTROLLED, W/RENAL COMPS 12/09/2007  . Essential hypertension, benign 12/08/2007  . DIASTASIS RECTI 12/08/2007  .  HYPERLIPIDEMIA 06/26/2006  . ANEMIA, OTHER, UNSPECIFIED 06/26/2006  . NEUROPATHY, PERIPHERAL 06/26/2006  . GASTROESOPHAGEAL REFLUX DISEASE, CHRONIC 06/26/2006  . Enlarged prostate with lower urinary tract symptoms (LUTS) 06/26/2006    History  Smoking status  . Former Smoker -- 1.00 packs/day for 10 years  . Types: Cigarettes  . Start date: January 31, 1941  . Quit date: 06/10/1967  Smokeless tobacco  . Never Used    History  Alcohol Use  . 1.0 oz/week  . 2 drink(s) per week    Comment: occasional    Family History  Problem Relation Age of Onset  . Alzheimer's disease Mother     died age 30  . Aortic aneurysm Father     died age 63  . Anuerysm Father   . Aortic aneurysm Brother     repaired plus AI graft  . Diabetes Brother     Review of Systems: Constitutional: no fever chills diaphoresis or fatigue or change in weight.  Head and neck: no hearing loss, no epistaxis, no photophobia or visual disturbance. Respiratory: No cough, shortness of breath or wheezing. Cardiovascular: No chest pain peripheral edema, palpitations.  Positive for intermittent left upper chest pain.  Gastrointestinal: No abdominal distention, no abdominal pain, no change in bowel habits hematochezia or melena. Genitourinary: No dysuria, no frequency, no urgency, no nocturia. Musculoskeletal:No arthralgias, no back pain, no gait disturbance or myalgias. Neurological: No dizziness, no headaches, no numbness, no seizures, no syncope, no weakness, no tremors. Hematologic: No lymphadenopathy, no easy bruising. Psychiatric: No confusion, no hallucinations, no sleep disturbance.    Physical Exam: Filed Vitals:   01/10/14 1122  BP: 124/64  Pulse: 76  The patient appears to be in no distress.  Head and neck exam reveals that the pupils are equal and reactive.  The extraocular movements are full.  There is no scleral icterus.  Mouth and pharynx are benign.  No lymphadenopathy.  No carotid bruits.  The  jugular venous pressure is normal.  Thyroid is not enlarged or tender.  Chest is clear to percussion and auscultation.  No rales or rhonchi.  Expansion of the chest is symmetrical.  There is no chest wall tenderness. Heart reveals no abnormal lift or heave.  First and second heart sounds are normal.  There is no murmur gallop rub or click.  The abdomen is soft and nontender.  Bowel sounds are normoactive.  There is no hepatosplenomegaly or mass.  There are no abdominal bruits.  Extremities reveal no phlebitis or edema.  Pedal pulses are good.  There is no cyanosis or clubbing.  Neurologic exam is normal strength and no lateralizing weakness.  No sensory deficits.  Integument reveals no rash  EKG from 12/14/13 was reviewed and is within normal limits  Assessment / Plan: 1.  Atypical chest pain with multiple risk factors for ischemic heart disease 2. insulin-dependent diabetes mellitus with mild renal insufficiency 3. history of hypercholesterolemia on statin 4. essential hypertension  Disposition: We will have him return for an Bruce protocol treadmill stress test.  We will also obtain a two-dimensional echocardiogram.  He has not had a chest x-ray since 2009 and we will also update his chest x-ray.  Many thanks for the opportunity to see this pleasant gentleman with you.  We will be in touch with you regarding the results of his various studies.

## 2014-01-11 ENCOUNTER — Other Ambulatory Visit: Payer: Self-pay | Admitting: Family Medicine

## 2014-01-11 ENCOUNTER — Telehealth: Payer: Self-pay | Admitting: Cardiology

## 2014-01-11 NOTE — Telephone Encounter (Signed)
New message    Patient calling stating she returning the nurse call.

## 2014-01-11 NOTE — Telephone Encounter (Signed)
Message copied by Burnell Blanks on Tue Jan 11, 2014  3:13 PM ------      Message from: Cassell Clement      Created: Mon Jan 10, 2014  9:29 PM       Heart size normal. Lungs clear. Old right rib fractures seen. ------

## 2014-01-11 NOTE — Telephone Encounter (Signed)
Advised patient

## 2014-01-16 ENCOUNTER — Encounter: Payer: Self-pay | Admitting: Family Medicine

## 2014-01-25 ENCOUNTER — Telehealth (HOSPITAL_COMMUNITY): Payer: Self-pay

## 2014-01-25 NOTE — Telephone Encounter (Signed)
Encounter complete. 

## 2014-01-27 ENCOUNTER — Ambulatory Visit (HOSPITAL_BASED_OUTPATIENT_CLINIC_OR_DEPARTMENT_OTHER)
Admission: RE | Admit: 2014-01-27 | Discharge: 2014-01-27 | Disposition: A | Discharge status: Home / Self Care | Payer: Medicare Other | Source: Ambulatory Visit | Attending: Cardiology | Admitting: Cardiology

## 2014-01-27 ENCOUNTER — Ambulatory Visit (HOSPITAL_COMMUNITY)
Admission: RE | Admit: 2014-01-27 | Discharge: 2014-01-27 | Disposition: A | Discharge status: Home / Self Care | Payer: Medicare Other | Source: Ambulatory Visit | Attending: Internal Medicine | Admitting: Internal Medicine

## 2014-01-27 DIAGNOSIS — R0789 Other chest pain: Secondary | ICD-10-CM

## 2014-01-27 DIAGNOSIS — I517 Cardiomegaly: Secondary | ICD-10-CM

## 2014-01-27 DIAGNOSIS — I10 Essential (primary) hypertension: Secondary | ICD-10-CM

## 2014-01-27 DIAGNOSIS — R079 Chest pain, unspecified: Secondary | ICD-10-CM

## 2014-01-27 NOTE — Procedures (Signed)
Exercise Treadmill Test   Test  Exercise Tolerance Test Ordering MD: Cassell Clementhomas Brackbill, MD    Unique Test No: 1  Treadmill:  1  Indication for ETT: chest pain - rule out ischemia  Contraindication to ETT: No   Stress Modality: exercise - treadmill  Cardiac Imaging Performed: non   Protocol: standard Bruce - maximal  Max BP:  136/62  Max MPHR (bpm):  149 85% MPR (bpm):  127  MPHR obtained (bpm):  116 % MPHR obtained:  72  Reached 85% MPHR (min:sec):  0 Total Exercise Time (min-sec):  6  Workload in METS:  7.0 Borg Scale: 14  Reason ETT Terminated:  Marked SOB and Leg Fatigue    ST Segment Analysis At Rest: normal ST segments - no evidence of significant ST depression With Exercise: no evidence of significant ST depression  Other Information Arrhythmia:  No Angina during ETT:  absent (0) Quality of ETT:  non-diagnostic  ETT Interpretation:  Non-diagnostic treadmill stress test - patient did not meet target HR  Comments: No ST segment changes Fair exercise tolerance Non-diagnostic as MPHR was not achieved  Recommendations: Consider pharmacologic perfusion testing.  Tanner NoseKenneth C. Monie Shere, MD, [facility] Attending Cardiologist [facility]

## 2014-01-27 NOTE — Progress Notes (Signed)
2D Echo Performed 01/27/2014    Hamdi Kley, RCS  

## 2014-02-01 ENCOUNTER — Encounter: Payer: Self-pay | Admitting: Family Medicine

## 2014-02-01 ENCOUNTER — Telehealth: Payer: Self-pay | Admitting: *Deleted

## 2014-02-01 DIAGNOSIS — R0789 Other chest pain: Secondary | ICD-10-CM

## 2014-02-01 NOTE — Telephone Encounter (Signed)
Advised patient of results. [facility]

## 2014-02-01 NOTE — Telephone Encounter (Signed)
Message copied by Burnell BlanksPRATT, MELINDA B on Tue Feb 01, 2014  5:05 PM ------      Message from: Cassell ClementBRACKBILL, THOMAS      Created: Fri Jan 28, 2014  7:52 AM       Please report.  The echocardiogram is normal.  Good LV function and no wall motion abnormalities. ------

## 2014-02-01 NOTE — Telephone Encounter (Signed)
Message copied by Burnell BlanksPRATT, Finnegan Gatta B on Tue Feb 01, 2014  5:05 PM ------      Message from: Cassell ClementBRACKBILL, THOMAS      Created: Fri Jan 28, 2014  7:54 AM       Please report.  The treadmill stress test was nondiagnostic because he was not able to get his heart rate up high enough.  To rule out ischemia we will need to have him get a LexiScan Myoview stress test ------

## 2014-02-08 ENCOUNTER — Encounter (HOSPITAL_COMMUNITY): Payer: Medicare Other

## 2014-02-15 ENCOUNTER — Ambulatory Visit (HOSPITAL_COMMUNITY): Payer: Medicare Other | Attending: Cardiovascular Disease | Admitting: Radiology

## 2014-02-15 ENCOUNTER — Other Ambulatory Visit: Payer: Self-pay | Admitting: Family Medicine

## 2014-02-15 VITALS — BP 132/78 | HR 71 | Ht 67.0 in | Wt 164.0 lb

## 2014-02-15 DIAGNOSIS — E119 Type 2 diabetes mellitus without complications: Secondary | ICD-10-CM | POA: Diagnosis not present

## 2014-02-15 DIAGNOSIS — I10 Essential (primary) hypertension: Secondary | ICD-10-CM | POA: Insufficient documentation

## 2014-02-15 DIAGNOSIS — R0789 Other chest pain: Secondary | ICD-10-CM

## 2014-02-15 DIAGNOSIS — R079 Chest pain, unspecified: Secondary | ICD-10-CM | POA: Diagnosis present

## 2014-02-15 MED ORDER — INSULIN PEN NEEDLE 32G X 4 MM MISC: Status: DC

## 2014-02-15 MED ORDER — TECHNETIUM TC 99M SESTAMIBI GENERIC - CARDIOLITE
33.0000 | Freq: Once | INTRAVENOUS | Status: AC | PRN
  Administered 2014-02-15: 33 via INTRAVENOUS

## 2014-02-15 MED ORDER — REGADENOSON 0.4 MG/5ML IV SOLN
0.4000 mg | Freq: Once | INTRAVENOUS | Status: AC
  Administered 2014-02-15: 0.4 mg via INTRAVENOUS

## 2014-02-15 MED ORDER — TECHNETIUM TC 99M SESTAMIBI GENERIC - CARDIOLITE
11.0000 | Freq: Once | INTRAVENOUS | Status: AC | PRN
  Administered 2014-02-15: 11 via INTRAVENOUS

## 2014-02-15 NOTE — Progress Notes (Signed)
[facility] SITE [facility] NUCLEAR MED [address] [place], [place] [address] [phone]    Cardiology Nuclear Med Study  Tanner RueCharles A Mason is a 73 y.o. male     MRN : [id number]     DOB: 06-Jan-1941  Procedure Date: 02/15/2014  Nuclear Med Background Indication for Stress Test:  Evaluation for Ischemia, and 01-27-2014 Exercise Tolerance test: Non-Diagnostic due to unable to reach target heart rate, 116 HR= 72% History:  No known CAD Cardiac Risk Factors: Hypertension and IDDM   Symptoms:  Chest Pain   Nuclear Pre-Procedure Caffeine/Decaff Intake:  None> 12 hrs NPO After: 6:00pm   Lungs:  clear O2 Sat: 97% on room air. IV 0.9% NS with Angio Cath:  22g  IV Site: L Antecubital x 1, tolerated well IV Started by:  Irean HongPatsy Edwards, RN  Chest Size (in):  40 Cup Size: n/a  Height: 5\' 7"  (1.702 m)  Weight:  164 lb (74.39 kg)  BMI:  Body mass index is 25.68 kg/(m^2). Tech Comments:  Patient held Lopressor this am. Fasting CBG was 135 at 0600 today. No Novolog Insulin, Lantus Insulin, or Victoza this am. Irean HongPatsy Edwards, RN.    Nuclear Med Study 1 or 2 day study: 1 day  Stress Test Type:  Treadmill/Lexiscan  Reading MD: N/A  Order Authorizing Provider:  Cassell Clementhomas Bethani Brugger, MD  Resting Radionuclide: Technetium 9369m Sestamibi  Resting Radionuclide Dose: 11.0 mCi   Stress Radionuclide:  Technetium 2969m Sestamibi  Stress Radionuclide Dose: 33.0 mCi           Stress Protocol Rest HR: 71 Stress HR: 99  Rest BP: 132/78 Stress BP: 109/59  Exercise Time (min): n/a METS: n/a           Dose of Adenosine (mg):  n/a Dose of Lexiscan: 0.4 mg  Dose of Atropine (mg): n/a Dose of Dobutamine: n/a mcg/kg/min (at max HR)  Stress Test Technologist: Nelson ChimesSharon Brooks, BS-ES  Nuclear Technologist:  Kerby NoraElzbieta Kubak, CNMT     Rest Procedure:  Myocardial perfusion imaging was performed at rest 45 minutes following the intravenous administration of Technetium 869m Sestamibi. Rest ECG: NSR - Normal  EKG  Stress Procedure:  The patient received IV Lexiscan 0.4 mg over 15-seconds with concurrent low level exercise and then Technetium 4969m Sestamibi was injected at 30-seconds while the patient continued walking one more minute.  Quantitative spect images were obtained after a 45-minute delay.  During the infusion of Lexiscan the patient complained of SOB that resolved in recovery.  Stress ECG: No significant change from baseline ECG  QPS Raw Data Images:  Normal; no motion artifact; normal heart/lung ratio. Stress Images:  Normal homogeneous uptake in all areas of the myocardium. Rest Images:  Normal homogeneous uptake in all areas of the myocardium. Subtraction (SDS):  No evidence of ischemia. Transient Ischemic Dilatation (Normal <1.22):  1.02 Lung/Heart Ratio (Normal <0.45):  0.31  Quantitative Gated Spect Images QGS EDV:  80 ml QGS ESV:  38 ml  Impression Exercise Capacity:  Lexiscan with low level exercise. BP Response:  Normal blood pressure response. Clinical Symptoms:  No chest pain. ECG Impression:  No significant ST segment change suggestive of ischemia. Comparison with Prior Nuclear Study: No images to compare  Overall Impression:  Normal stress nuclear study.  LV Ejection Fraction: 53%.  LV Wall Motion:  NL LV Function; NL Wall Motion  Cassell Clementhomas Raja Caputi MD

## 2014-03-16 ENCOUNTER — Other Ambulatory Visit: Payer: Self-pay | Admitting: *Deleted

## 2014-03-16 ENCOUNTER — Other Ambulatory Visit: Payer: Self-pay | Admitting: Family Medicine

## 2014-03-17 MED ORDER — GLUCOSE BLOOD VI STRP: ORAL_STRIP | Status: DC

## 2014-03-25 ENCOUNTER — Other Ambulatory Visit: Payer: Self-pay | Admitting: Family Medicine

## 2014-04-05 ENCOUNTER — Encounter: Payer: Self-pay | Admitting: Family Medicine

## 2014-04-05 ENCOUNTER — Ambulatory Visit (INDEPENDENT_AMBULATORY_CARE_PROVIDER_SITE_OTHER): Payer: Medicare Other | Admitting: Family Medicine

## 2014-04-05 ENCOUNTER — Other Ambulatory Visit: Payer: Self-pay | Admitting: Dermatology

## 2014-04-05 VITALS — BP 106/68 | HR 76 | Temp 97.6°F | Wt 166.0 lb

## 2014-04-05 DIAGNOSIS — M5417 Radiculopathy, lumbosacral region: Secondary | ICD-10-CM

## 2014-04-05 DIAGNOSIS — M5416 Radiculopathy, lumbar region: Secondary | ICD-10-CM

## 2014-04-05 DIAGNOSIS — M21372 Foot drop, left foot: Secondary | ICD-10-CM | POA: Insufficient documentation

## 2014-04-05 NOTE — Assessment & Plan Note (Signed)
Two week history of left foot drop in setting of low back pain. -MRI lumbar spine ordered

## 2014-04-05 NOTE — Patient Instructions (Signed)
Back pain/left foot drop - I am concerned that you may have compression on your lumbar spine, we will check an MRI of your low back

## 2014-04-05 NOTE — Progress Notes (Signed)
   Subjective:    Patient ID: Tanner Mason, male    DOB: 1941-04-25, 73 y.o.   MRN: [id number]  HPI 73 y/o male presents for evaluation of back pain.   He reports 2 week history of left sided low back pain, radiation of pain down the left buttock/leg, no new numbness/tingling, does report left foot drop, see in urgent care about 1 week ago, given pain medication and anti-inflammatories, this has provided minimal relief of pain, no injury, no bladder/bowel incontinence, no saddle anesthesia  Social - lives with wife, non-smoker  Review of Systems No fevers/chills    Objective:   Physical Exam Vitals: reviewed MSK: left sided lumbar paraspinal tenderness, SLT negative bilaterally Neuro: strength bilateral hip flexion/leg flexion/leg extension 5/5, right foot dorsiflexion/plantarflexion 5/5, left foot dorsiflexion 3/5, sensation to light touch intact in LE, 2+ patellar and achilles reflexes, no clonus, normal gait except left foot drop (slaps floor), unable to go to tip-toes on the left, unable to walk on heels on left     Assessment & Plan:  Please see problem specific assessment and plan.

## 2014-04-05 NOTE — Assessment & Plan Note (Signed)
Left lumbar back pain with left foot drop -MRI ordered to evaluate lumbar spine

## 2014-04-07 ENCOUNTER — Encounter: Payer: Self-pay | Admitting: Family Medicine

## 2014-04-08 NOTE — Telephone Encounter (Signed)
RN staff attempted to get earlier MRI, [facility] called to notify patient, reiterated that if symptoms progress to call office immediately or go to ER

## 2014-04-08 NOTE — Telephone Encounter (Signed)
Returned patient's call. Pain is improved however he now reports some weakness in the upper leg/thigh on the left side (no right sided symptoms), no fevers/chills, no bladder/bowel incontinence, no new numbness/tingling, patient has MRI scheduled 04/13/14, told patient that I will have nursing staff call to attempt and get and earlier MRI, if symptoms continue to worsen may need to admit patient to hospital for workup.  Note to nursing staff - please call imaging to see if earlier MRI can be scheduled, thanks

## 2014-04-10 ENCOUNTER — Other Ambulatory Visit: Payer: Self-pay | Admitting: Family Medicine

## 2014-04-13 ENCOUNTER — Ambulatory Visit
Admission: RE | Admit: 2014-04-13 | Discharge: 2014-04-13 | Disposition: A | Discharge status: Home / Self Care | Payer: Medicare Other | Source: Ambulatory Visit | Attending: Family Medicine | Admitting: Family Medicine

## 2014-04-13 DIAGNOSIS — M21372 Foot drop, left foot: Secondary | ICD-10-CM

## 2014-04-14 ENCOUNTER — Telehealth: Payer: Self-pay | Admitting: Family Medicine

## 2014-04-14 DIAGNOSIS — M5416 Radiculopathy, lumbar region: Secondary | ICD-10-CM

## 2014-04-14 NOTE — Telephone Encounter (Signed)
[facility] at 8:30am, patient informed.

## 2014-04-14 NOTE — Telephone Encounter (Signed)
Spoke to patient about MRI results, compression of L5 nerve root on the left consistent with left foot drop, will refer to orthopedic surgery  Note to nursing staff - referral placed for orthopedics, please arrange, I would like him to be seen as soon as possible please

## 2014-04-20 ENCOUNTER — Encounter: Payer: Self-pay | Admitting: Family Medicine

## 2014-05-04 ENCOUNTER — Encounter: Payer: Self-pay | Admitting: Family Medicine

## 2014-05-04 ENCOUNTER — Ambulatory Visit (INDEPENDENT_AMBULATORY_CARE_PROVIDER_SITE_OTHER): Payer: Medicare Other | Admitting: Family Medicine

## 2014-05-04 VITALS — BP 107/66 | HR 91 | Temp 98.0°F | Wt 163.0 lb

## 2014-05-04 DIAGNOSIS — B9689 Other specified bacterial agents as the cause of diseases classified elsewhere: Secondary | ICD-10-CM

## 2014-05-04 DIAGNOSIS — J988 Other specified respiratory disorders: Secondary | ICD-10-CM

## 2014-05-04 MED ORDER — AZITHROMYCIN 250 MG PO TABS: ORAL_TABLET | ORAL | Status: DC

## 2014-05-04 MED ORDER — HYDROCODONE-HOMATROPINE 5-1.5 MG/5ML PO SYRP: 5.0000 mL | ORAL_SOLUTION | Freq: Three times a day (TID) | ORAL | Status: DC | PRN

## 2014-05-04 NOTE — Progress Notes (Signed)
   Subjective:    Patient ID: Tanner Mason, male    DOB: Mar 06, 1941, 74 y.o.   MRN: [id number]  HPI: Pt presents to clinic for SDA visit for 2-3 days of cough productive of greenish sputum, congestion, headache, runny nose, and sore throat. He has not had fever, N/V, or diarrhea. He has no chest pain or shortness of breath with it, but his cough is severe enough to interfere with his sleep. He has had reduced appetite but has been "forcing himself to drink water." He has taken some Sudafed without much help. Of note, his wife has similar symptoms. He had the flu shot this year.  Review of Systems: As above.     Objective:   Physical Exam BP 107/66 mmHg  Pulse 91  Temp(Src) 98 F (36.7 C) (Oral)  Wt 163 lb (73.936 kg) Gen: non-toxic but uncomfortable-appearing elderly adult male, in NAD HEENT: [place]/AT, EOMI, PERRLA, TM's clear bilaterally  Nasal mucosae very red / inflamed, posterior oral mucose red / edematous, no tonsillar exudate  Some tenderness to palpation over sinuses, especially maxillary Neck: supple, normal ROM, no significant anterior cervical lymphadenopathy Cardio: RRR, no murmur Pulm: CTAB, no wheezes Abd: soft, nontender, BS+ Ext: warm, well-perfused     Assessment & Plan:  74yo male with viral URI-type symptoms, but with severe productive cough and congestion - possible early bacterial superinfection, though pt is currently afebrile with clear lung sounds - doubt utility of CXR or blood testing, at this time  Plan: - Rx for azithromycin 250 mg (2 tabs day 1, 1 tab days 2-4) and Hycodan for cough - advised use of Hycodan at bedtime to start with, then up to TID PRN but not to use within 6 hours of driving - continue supportive care otherwise; push fluids, OTC meds for congestion / pain / any fevers - reviewed red flags that would prompt immediate return to clinic, including high fever, difficulty breathing, worse productive cough, etc - f/u with PCP Dr. Randolm IdolFletke as  needed, otherwise  Note FYI to Dr. Carney HarderFletke  Michaella Imai M Sibbie Flammia, MD PGY-3, [facility] 05/04/2014, 3:41 PM

## 2014-05-04 NOTE — Patient Instructions (Signed)
Thank you for coming in, today!  I think you probably had a virus that might be working on turning into a bacterial infection. I will give you a prescription for azithromycin for 5 days. I will also give you a prescription for Hycodan for cough. This has hydrocodone in it, which might make you very sleepy. You can take it up to 3 times per day, but don't take it within about 6 hours of needing to drive. You can start by taking it at bedtime, to see how sleepy it makes you. Otherwise, you can take Mucinex over the counter for congestion, and Tylenol for pain or fever.  Come back to see us as you need. Follow up with Dr. Randolm IdolFletke as instructed otherwise.  Please feel free to call with any questions or concerns at any time, at [phone]. --Dr. Casper HarrisonStreet

## 2014-05-13 ENCOUNTER — Other Ambulatory Visit: Payer: Self-pay | Admitting: *Deleted

## 2014-05-13 MED ORDER — INSULIN GLARGINE 100 UNIT/ML SOLOSTAR PEN: 50.0000 [IU] | PEN_INJECTOR | Freq: Every day | SUBCUTANEOUS | Status: DC

## 2014-05-13 MED ORDER — LIRAGLUTIDE 18 MG/3ML ~~LOC~~ SOPN: 1.8000 mg | PEN_INJECTOR | Freq: Every day | SUBCUTANEOUS | Status: DC

## 2014-05-13 MED ORDER — INSULIN ASPART 100 UNIT/ML FLEXPEN: PEN_INJECTOR | SUBCUTANEOUS | Status: DC

## 2014-05-13 NOTE — Telephone Encounter (Signed)
Patient is requesting 90-day supply.  Altamese Dilling~Jeannette Richardson, BSN, RN-BC

## 2014-05-23 ENCOUNTER — Other Ambulatory Visit: Payer: Self-pay | Admitting: Family Medicine

## 2014-05-25 ENCOUNTER — Other Ambulatory Visit: Payer: Self-pay | Admitting: *Deleted

## 2014-07-04 ENCOUNTER — Encounter: Payer: Self-pay | Admitting: Family Medicine

## 2014-07-04 ENCOUNTER — Ambulatory Visit (INDEPENDENT_AMBULATORY_CARE_PROVIDER_SITE_OTHER): Payer: Medicare Other | Admitting: Family Medicine

## 2014-07-04 VITALS — BP 128/74 | HR 76 | Temp 97.4°F | Ht 67.0 in | Wt 168.6 lb

## 2014-07-04 DIAGNOSIS — G609 Hereditary and idiopathic neuropathy, unspecified: Secondary | ICD-10-CM

## 2014-07-04 DIAGNOSIS — E876 Hypokalemia: Secondary | ICD-10-CM

## 2014-07-04 DIAGNOSIS — M5417 Radiculopathy, lumbosacral region: Secondary | ICD-10-CM

## 2014-07-04 DIAGNOSIS — E118 Type 2 diabetes mellitus with unspecified complications: Secondary | ICD-10-CM

## 2014-07-04 DIAGNOSIS — I10 Essential (primary) hypertension: Secondary | ICD-10-CM

## 2014-07-04 DIAGNOSIS — E1122 Type 2 diabetes mellitus with diabetic chronic kidney disease: Secondary | ICD-10-CM

## 2014-07-04 DIAGNOSIS — N183 Chronic kidney disease, stage 3 (moderate): Secondary | ICD-10-CM

## 2014-07-04 DIAGNOSIS — M5416 Radiculopathy, lumbar region: Secondary | ICD-10-CM

## 2014-07-04 DIAGNOSIS — E1129 Type 2 diabetes mellitus with other diabetic kidney complication: Secondary | ICD-10-CM

## 2014-07-04 LAB — POCT GLYCOSYLATED HEMOGLOBIN (HGB A1C): Hemoglobin A1C: 5.6

## 2014-07-04 LAB — BASIC METABOLIC PANEL
BUN: 22 mg/dL (ref 6–23)
CO2: 28 mEq/L (ref 19–32)
Calcium: 9.7 mg/dL (ref 8.4–10.5)
Chloride: 102 mEq/L (ref 96–112)
Creat: 1.72 mg/dL — ABNORMAL HIGH (ref 0.50–1.35)
Glucose, Bld: 99 mg/dL (ref 70–99)
Potassium: 3.5 mEq/L (ref 3.5–5.3)
Sodium: 143 mEq/L (ref 135–145)

## 2014-07-04 MED ORDER — INSULIN ASPART 100 UNIT/ML FLEXPEN: PEN_INJECTOR | SUBCUTANEOUS | Status: DC

## 2014-07-04 MED ORDER — INSULIN GLARGINE 100 UNIT/ML SOLOSTAR PEN: 45.0000 [IU] | PEN_INJECTOR | Freq: Every morning | SUBCUTANEOUS | Status: DC

## 2014-07-04 MED ORDER — GLUCOSE BLOOD VI STRP: ORAL_STRIP | Status: DC

## 2014-07-04 NOTE — Assessment & Plan Note (Signed)
Due for recheck of Cr. Will also check urine microalbumin.

## 2014-07-04 NOTE — Assessment & Plan Note (Addendum)
Very well controlled with intermittent hypoglycemic episodes. A1C 5.6 today. Up to date on eye and foot exam. -decrease Lantus to 45 units daily -Novolog 8 units with breakfast, 15 units at lunch, 15 units at dinner -Continue Victoza -check microalbumin (not a candidate for ACE-I or ARB given history of angioedema)

## 2014-07-04 NOTE — Patient Instructions (Signed)
It was nice to see you today.  Diabetes - well controlled, A1C 5.6, decrease lantus to 45 units daily, change Novolog dose to 8 units with breakfast/15 unties with lunch/15 units with dinner  Continue to exercise daily, Up to date on eye and foot.   Call office in two weeks and give blood sugar readings.   Go to PT for your foot, may need surgery if symptoms persist.

## 2014-07-04 NOTE — Assessment & Plan Note (Signed)
On long term potassium supplementation. Check K+ level today.

## 2014-07-04 NOTE — Assessment & Plan Note (Signed)
Seen by neurosurgery. No plan for surgical intervention at this time. Continues to have left sided foot drop. Plans to start PT. Recommend that if symptoms not improved with PT may need to consider lumbar spine surgery.

## 2014-07-04 NOTE — Assessment & Plan Note (Signed)
Diabetic neuropathy controlled with gabapentin.

## 2014-07-04 NOTE — Addendum Note (Signed)
Addended by: Uvaldo RisingFLETKE, KYLE J on: 07/04/2014 03:22 PM   Modules accepted: Orders, Medications

## 2014-07-04 NOTE — Progress Notes (Signed)
   Subjective:    Patient ID: Tanner Mason, male    DOB: May 31, 1940, 74 y.o.   MRN: [id number]  HPI 74 y/o male presents for routine follow up.  IDT2DM - taking lantus 50 units daily, Novolog 8-15 units with meals, and Victoza, recent fasting blood glucoses have been in the 150-180's, [place] the past [place], has not been compliant with diabetic diet and ate out frequently, prior to this vacation blood sugars were much more controlled, fasting around 100 with multiple hypoglycemic episodes, reports exercising almost daily (ususally after breakfast), as he works out in the AM he only takes 8 units before breakfast to prevent hypoglycemia, up to date on eye and foot exams  Diabetic Neuropathy - stable on Gabapentin 300 mg TID  Hypertension - taking Metoprolol 100 BID, HCTZ 25 mg daiy, and Norvasc 10 mg daily, no chest pain/headaches/vision changes  Lumbar DDD - L4-L5 DDD, disc bulge with compression of L5 nerve root, seen by Neurosurgery, plan for PT, still have left sided foot drop which has had some improvement, no plan for surgical intervention at this time  Social - former smoker   Review of Systems  Constitutional: Negative for fever, chills and fatigue.  Respiratory: Negative for cough and shortness of breath.   Cardiovascular: Negative for chest pain.  Gastrointestinal: Negative for nausea, vomiting and diarrhea.  Musculoskeletal: Positive for back pain.       Objective:   Physical Exam Vitals: reviewed Gen: pleasant male, NAD HEENT: normocephalic, PERRL, EOMI, MMM, neck supple Cardiac: RRR, S1 and S2 present, no murmur Resp: CTAB, normal effort Ext: no edema, 2+ radial pulses and DP pulses bilaterally Skin: see foot exam in quality metrics  Reviewed lab work from previous 6 months.      Assessment & Plan:  Please see problem specific assessment and plan.

## 2014-07-04 NOTE — Assessment & Plan Note (Signed)
Controlled on current regimen. -continue Norvasc, Metoprolol, and HCTZ -not a candidate for ACE-I or ARB given history of angioedema

## 2014-07-05 ENCOUNTER — Telehealth: Payer: Self-pay | Admitting: Family Medicine

## 2014-07-05 LAB — MICROALBUMIN / CREATININE URINE RATIO
Creatinine, Urine: 187.5 mg/dL
Microalb Creat Ratio: 19.7 mg/g (ref 0.0–30.0)
Microalb, Ur: 3.7 mg/dL — ABNORMAL HIGH (ref <2.0)

## 2014-07-05 NOTE — Telephone Encounter (Signed)
Discussed labs with patient

## 2014-07-06 ENCOUNTER — Telehealth: Payer: Self-pay | Admitting: *Deleted

## 2014-07-06 DIAGNOSIS — E119 Type 2 diabetes mellitus without complications: Secondary | ICD-10-CM

## 2014-07-06 NOTE — Telephone Encounter (Signed)
Received a call from [facility] Pharmacist stating they need a Rx for One Touch Ultra II meter.  Please advise.  Clovis PuMartin, Tamika L, RN

## 2014-07-07 NOTE — Telephone Encounter (Signed)
Prescription for meter sent to pharmacy.  Nursing staff - please check with pharmacy to make sure prescription made it to the pharmacy. If not please give verbal order for glucometer (One Touch Ultra 2, dispense one meter, Dx. E11.9), thanks

## 2014-07-11 ENCOUNTER — Other Ambulatory Visit: Payer: Self-pay | Admitting: Dermatology

## 2014-07-11 NOTE — Telephone Encounter (Signed)
Pt states phamarcy doesn't have RX for the meter He did get the test strips

## 2014-07-11 NOTE — Telephone Encounter (Signed)
Left voice message informing pt that the One Touch Ultra II glucometer was called into [facility].  Clovis PuMartin, Mcadoo Muzquiz L, RN

## 2014-07-13 ENCOUNTER — Emergency Department (HOSPITAL_COMMUNITY): Payer: Medicare Other

## 2014-07-13 ENCOUNTER — Other Ambulatory Visit: Payer: Self-pay

## 2014-07-13 ENCOUNTER — Observation Stay (HOSPITAL_COMMUNITY)
Admission: EM | Admit: 2014-07-13 | Discharge: 2014-07-14 | Disposition: A | Discharge status: Home / Self Care | Payer: Medicare Other | Attending: Pulmonary Disease | Admitting: Pulmonary Disease

## 2014-07-13 ENCOUNTER — Encounter (HOSPITAL_COMMUNITY): Payer: Self-pay | Admitting: Oncology

## 2014-07-13 DIAGNOSIS — E663 Overweight: Secondary | ICD-10-CM

## 2014-07-13 DIAGNOSIS — Z888 Allergy status to other drugs, medicaments and biological substances status: Secondary | ICD-10-CM | POA: Insufficient documentation

## 2014-07-13 DIAGNOSIS — N401 Enlarged prostate with lower urinary tract symptoms: Secondary | ICD-10-CM | POA: Diagnosis not present

## 2014-07-13 DIAGNOSIS — M6200 Separation of muscle (nontraumatic), unspecified site: Secondary | ICD-10-CM

## 2014-07-13 DIAGNOSIS — R0789 Other chest pain: Secondary | ICD-10-CM

## 2014-07-13 DIAGNOSIS — N183 Chronic kidney disease, stage 3 (moderate): Secondary | ICD-10-CM

## 2014-07-13 DIAGNOSIS — E119 Type 2 diabetes mellitus without complications: Secondary | ICD-10-CM | POA: Diagnosis not present

## 2014-07-13 DIAGNOSIS — T783XXA Angioneurotic edema, initial encounter: Secondary | ICD-10-CM

## 2014-07-13 DIAGNOSIS — F1099 Alcohol use, unspecified with unspecified alcohol-induced disorder: Secondary | ICD-10-CM | POA: Insufficient documentation

## 2014-07-13 DIAGNOSIS — I10 Essential (primary) hypertension: Secondary | ICD-10-CM | POA: Diagnosis not present

## 2014-07-13 DIAGNOSIS — G609 Hereditary and idiopathic neuropathy, unspecified: Secondary | ICD-10-CM

## 2014-07-13 DIAGNOSIS — Z87891 Personal history of nicotine dependence: Secondary | ICD-10-CM | POA: Diagnosis not present

## 2014-07-13 DIAGNOSIS — K219 Gastro-esophageal reflux disease without esophagitis: Secondary | ICD-10-CM | POA: Insufficient documentation

## 2014-07-13 DIAGNOSIS — E1122 Type 2 diabetes mellitus with diabetic chronic kidney disease: Secondary | ICD-10-CM

## 2014-07-13 DIAGNOSIS — Z794 Long term (current) use of insulin: Secondary | ICD-10-CM | POA: Diagnosis not present

## 2014-07-13 DIAGNOSIS — E08329 Diabetes mellitus due to underlying condition with mild nonproliferative diabetic retinopathy without macular edema: Secondary | ICD-10-CM

## 2014-07-13 DIAGNOSIS — T7840XA Allergy, unspecified, initial encounter: Secondary | ICD-10-CM

## 2014-07-13 DIAGNOSIS — E876 Hypokalemia: Secondary | ICD-10-CM

## 2014-07-13 DIAGNOSIS — M5416 Radiculopathy, lumbar region: Secondary | ICD-10-CM

## 2014-07-13 DIAGNOSIS — M21372 Foot drop, left foot: Secondary | ICD-10-CM

## 2014-07-13 HISTORY — DX: Angioneurotic edema, initial encounter: T78.3XXA

## 2014-07-13 HISTORY — DX: Type 2 diabetes mellitus without complications: E11.9

## 2014-07-13 LAB — COMPREHENSIVE METABOLIC PANEL
ALT: 33 U/L (ref 0–53)
AST: 29 U/L (ref 0–37)
Albumin: 4.6 g/dL (ref 3.5–5.2)
Alkaline Phosphatase: 75 U/L (ref 39–117)
Anion gap: 10 (ref 5–15)
BUN: 25 mg/dL — ABNORMAL HIGH (ref 6–23)
CO2: 24 mmol/L (ref 19–32)
Calcium: 8.8 mg/dL (ref 8.4–10.5)
Chloride: 105 mmol/L (ref 96–112)
Creatinine, Ser: 1.83 mg/dL — ABNORMAL HIGH (ref 0.50–1.35)
GFR calc Af Amer: 41 mL/min — ABNORMAL LOW (ref >90)
GFR calc non Af Amer: 35 mL/min — ABNORMAL LOW (ref >90)
Glucose, Bld: 345 mg/dL — ABNORMAL HIGH (ref 70–99)
Potassium: 3.7 mmol/L (ref 3.5–5.1)
Sodium: 139 mmol/L (ref 135–145)
Total Bilirubin: 1.1 mg/dL (ref 0.3–1.2)
Total Protein: 7.5 g/dL (ref 6.0–8.3)

## 2014-07-13 LAB — URINE MICROSCOPIC-ADD ON

## 2014-07-13 LAB — URINALYSIS, ROUTINE W REFLEX MICROSCOPIC
Bilirubin Urine: NEGATIVE
Glucose, UA: >1000 mg/dL — AB
Ketones, ur: NEGATIVE mg/dL
Leukocytes, UA: NEGATIVE
Nitrite: NEGATIVE
Protein, ur: NEGATIVE mg/dL
Specific Gravity, Urine: 1.015 (ref 1.005–1.030)
Urobilinogen, UA: 0.2 mg/dL (ref 0.0–1.0)
pH: 6 (ref 5.0–8.0)

## 2014-07-13 LAB — PHOSPHORUS: Phosphorus: 2.6 mg/dL (ref 2.3–4.6)

## 2014-07-13 LAB — PROTIME-INR
INR: 1.06 (ref 0.00–1.49)
Prothrombin Time: 13.9 seconds (ref 11.6–15.2)

## 2014-07-13 LAB — CBC WITH DIFFERENTIAL/PLATELET
Basophils Absolute: 0 10*3/uL (ref 0.0–0.1)
Basophils Relative: 0 % (ref 0–1)
Eosinophils Absolute: 0.1 10*3/uL (ref 0.0–0.7)
Eosinophils Relative: 2 % (ref 0–5)
HCT: 37.3 % — ABNORMAL LOW (ref 39.0–52.0)
Hemoglobin: 13.2 g/dL (ref 13.0–17.0)
Lymphocytes Relative: 47 % — ABNORMAL HIGH (ref 12–46)
Lymphs Abs: 3.5 10*3/uL (ref 0.7–4.0)
MCH: 32.4 pg (ref 26.0–34.0)
MCHC: 35.4 g/dL (ref 30.0–36.0)
MCV: 91.6 fL (ref 78.0–100.0)
Monocytes Absolute: 0.6 10*3/uL (ref 0.1–1.0)
Monocytes Relative: 8 % (ref 3–12)
Neutro Abs: 3.2 10*3/uL (ref 1.7–7.7)
Neutrophils Relative %: 43 % (ref 43–77)
Platelets: 145 10*3/uL — ABNORMAL LOW (ref 150–400)
RBC: 4.07 MIL/uL — ABNORMAL LOW (ref 4.22–5.81)
RDW: 14.1 % (ref 11.5–15.5)
WBC: 7.4 10*3/uL (ref 4.0–10.5)

## 2014-07-13 LAB — CBG MONITORING, ED
Glucose-Capillary: 337 mg/dL — ABNORMAL HIGH (ref 70–99)
Glucose-Capillary: 234 mg/dL — ABNORMAL HIGH (ref 70–99)

## 2014-07-13 LAB — MAGNESIUM: Magnesium: 1.5 mg/dL (ref 1.5–2.5)

## 2014-07-13 LAB — CBC
HCT: 34.4 % — ABNORMAL LOW (ref 39.0–52.0)
Hemoglobin: 12.1 g/dL — ABNORMAL LOW (ref 13.0–17.0)
MCH: 32.3 pg (ref 26.0–34.0)
MCHC: 35.2 g/dL (ref 30.0–36.0)
MCV: 91.7 fL (ref 78.0–100.0)
Platelets: 124 10*3/uL — ABNORMAL LOW (ref 150–400)
RBC: 3.75 MIL/uL — ABNORMAL LOW (ref 4.22–5.81)
RDW: 13.9 % (ref 11.5–15.5)
WBC: 4.6 10*3/uL (ref 4.0–10.5)

## 2014-07-13 LAB — GLUCOSE, CAPILLARY: Glucose-Capillary: 310 mg/dL — ABNORMAL HIGH (ref 70–99)

## 2014-07-13 LAB — APTT: aPTT: 36 seconds (ref 24–37)

## 2014-07-13 LAB — I-STAT CHEM 8, ED
BUN: 25 mg/dL — ABNORMAL HIGH (ref 6–23)
Calcium, Ion: 1.2 mmol/L (ref 1.13–1.30)
Chloride: 102 mmol/L (ref 96–112)
Creatinine, Ser: 2 mg/dL — ABNORMAL HIGH (ref 0.50–1.35)
Glucose, Bld: 223 mg/dL — ABNORMAL HIGH (ref 70–99)
HCT: 38 % — ABNORMAL LOW (ref 39.0–52.0)
Hemoglobin: 12.9 g/dL — ABNORMAL LOW (ref 13.0–17.0)
Potassium: 3.1 mmol/L — ABNORMAL LOW (ref 3.5–5.1)
Sodium: 142 mmol/L (ref 135–145)
TCO2: 24 mmol/L (ref 0–100)

## 2014-07-13 LAB — CORTISOL: Cortisol, Plasma: 19.8 ug/dL

## 2014-07-13 LAB — MRSA PCR SCREENING: MRSA by PCR: NEGATIVE

## 2014-07-13 LAB — LACTIC ACID, PLASMA: Lactic Acid, Venous: 2.4 mmol/L (ref 0.5–2.0)

## 2014-07-13 LAB — STREP PNEUMONIAE URINARY ANTIGEN: Strep Pneumo Urinary Antigen: NEGATIVE

## 2014-07-13 MED ORDER — EPINEPHRINE 0.3 MG/0.3ML IJ SOAJ
INTRAMUSCULAR | Status: AC
  Filled 2014-07-13: qty 0.3

## 2014-07-13 MED ORDER — SUCCINYLCHOLINE CHLORIDE 20 MG/ML IJ SOLN
INTRAMUSCULAR | Status: AC
  Filled 2014-07-13: qty 1

## 2014-07-13 MED ORDER — ETOMIDATE 2 MG/ML IV SOLN
INTRAVENOUS | Status: AC
  Filled 2014-07-13: qty 20

## 2014-07-13 MED ORDER — LIDOCAINE HCL (CARDIAC) 20 MG/ML IV SOLN
INTRAVENOUS | Status: DC
Start: 2014-07-13 — End: 2014-07-13
  Filled 2014-07-13: qty 5

## 2014-07-13 MED ORDER — METHYLPREDNISOLONE SODIUM SUCC 125 MG IJ SOLR: 80.0000 mg | Freq: Four times a day (QID) | INTRAMUSCULAR | Status: DC

## 2014-07-13 MED ORDER — METHYLPREDNISOLONE SODIUM SUCC 40 MG IJ SOLR
40.0000 mg | Freq: Four times a day (QID) | INTRAMUSCULAR | Status: DC
  Administered 2014-07-13: 40 mg via INTRAVENOUS
  Filled 2014-07-13: qty 1

## 2014-07-13 MED ORDER — FAMOTIDINE IN NACL 20-0.9 MG/50ML-% IV SOLN
20.0000 mg | Freq: Once | INTRAVENOUS | Status: AC
  Administered 2014-07-13: 20 mg via INTRAVENOUS
  Filled 2014-07-13: qty 50

## 2014-07-13 MED ORDER — DIPHENHYDRAMINE HCL 50 MG/ML IJ SOLN: 50.0000 mg | Freq: Once | INTRAMUSCULAR | Status: DC

## 2014-07-13 MED ORDER — METHYLPREDNISOLONE SODIUM SUCC 125 MG IJ SOLR
INTRAMUSCULAR | Status: AC
  Filled 2014-07-13: qty 2

## 2014-07-13 MED ORDER — HEPARIN SODIUM (PORCINE) 5000 UNIT/ML IJ SOLN
5000.0000 [IU] | Freq: Three times a day (TID) | INTRAMUSCULAR | Status: DC
  Administered 2014-07-13 – 2014-07-14 (×3): 5000 [IU] via SUBCUTANEOUS
  Filled 2014-07-13 (×4): qty 1

## 2014-07-13 MED ORDER — ROCURONIUM BROMIDE 50 MG/5ML IV SOLN
INTRAVENOUS | Status: AC
  Filled 2014-07-13: qty 2

## 2014-07-13 MED ORDER — LABETALOL HCL 5 MG/ML IV SOLN: 10.0000 mg | INTRAVENOUS | Status: DC | PRN

## 2014-07-13 MED ORDER — EPINEPHRINE 0.3 MG/0.3ML IJ SOAJ
0.3000 mg | Freq: Once | INTRAMUSCULAR | Status: AC
  Administered 2014-07-13: 0.3 mg via INTRAMUSCULAR

## 2014-07-13 MED ORDER — DIPHENHYDRAMINE HCL 50 MG/ML IJ SOLN
INTRAMUSCULAR | Status: AC
  Administered 2014-07-13: 50 mg
  Filled 2014-07-13: qty 1

## 2014-07-13 MED ORDER — PANTOPRAZOLE SODIUM 40 MG IV SOLR: 40.0000 mg | Freq: Every day | INTRAVENOUS | Status: DC

## 2014-07-13 MED ORDER — INSULIN ASPART 100 UNIT/ML ~~LOC~~ SOLN
0.0000 [IU] | SUBCUTANEOUS | Status: DC
  Administered 2014-07-13: 5 [IU] via SUBCUTANEOUS
  Administered 2014-07-13 (×3): 11 [IU] via SUBCUTANEOUS
  Administered 2014-07-14 (×3): 5 [IU] via SUBCUTANEOUS
  Filled 2014-07-13 (×2): qty 1

## 2014-07-13 MED ORDER — FAMOTIDINE IN NACL 20-0.9 MG/50ML-% IV SOLN
20.0000 mg | Freq: Two times a day (BID) | INTRAVENOUS | Status: DC
  Administered 2014-07-13 – 2014-07-14 (×3): 20 mg via INTRAVENOUS
  Filled 2014-07-13 (×4): qty 50

## 2014-07-13 MED ORDER — LIDOCAINE HCL 2 % EX GEL
1.0000 "application " | Freq: Once | CUTANEOUS | Status: DC
  Filled 2014-07-13: qty 10

## 2014-07-13 MED ORDER — DIPHENHYDRAMINE HCL 50 MG/ML IJ SOLN
25.0000 mg | Freq: Once | INTRAMUSCULAR | Status: AC
  Administered 2014-07-13: 25 mg via INTRAVENOUS
  Filled 2014-07-13: qty 1

## 2014-07-13 MED ORDER — METHYLPREDNISOLONE SODIUM SUCC 125 MG IJ SOLR
125.0000 mg | Freq: Once | INTRAMUSCULAR | Status: AC
  Administered 2014-07-13: 125 mg via INTRAVENOUS

## 2014-07-13 MED ORDER — SODIUM CHLORIDE 0.9 % IV SOLN
INTRAVENOUS | Status: DC
  Administered 2014-07-13: 09:00:00 via INTRAVENOUS

## 2014-07-13 NOTE — ED Notes (Signed)
Bed: RESA Expected date:  Expected time:  Means of arrival:  Comments: Allergic rxn

## 2014-07-13 NOTE — H&P (Signed)
Name: Tanner Mason MRN: [id number] DOB: [birth year]    ADMISSION DATE:  07/13/2014   REFERRING MD :  EDP  CHIEF COMPLAINT:  Tongue swelling  BRIEF PATIENT DESCRIPTION:  74 yo male with recurrent angioedema.  SIGNIFICANT EVENTS  3/16 Admit, ENT evaluation in ER  STUDIES:  3/16 Laryngoscopy >> mild edema of Lt nasopharynx, mod edema of vallecula, Lt arytenoid edematous   HISTORY OF PRESENT ILLNESS:   74 yo WM with history of multiple cases of tongue swelling. Taken off ACE-I but multiple attacks since. Previous allergy work up with out triggers identified. He was on Claritin for prophylaxis but stopped 3 weeks ago. 3/15 2200 while at baseball game and eating a chocolate peanut butter candy he noted swelling anterior neck and tongue. He never developed stridor or respiratory distress. Self medicated with benadryl and Epi pen. Presented to [facility] ED and was treated with steroids, repeat Epi pen, H2 blockers. ENT evaluation per Dr. Jenne Pane as noted. PCCM will admit for 24 hour observation in ICU. He is stable and in no distress at this time) 0800 07/13/14)  PAST MEDICAL HISTORY :   has a past medical history of Elbow dislocation (4098,1191); Treadmill stress test negative for angina pectoris (06/2002); Abdominal ultrasound, abnormal (02/2004); Encounter for diagnostic endoscopy (10/22/04); History of esophagogastroduodenoscopy (08/13/2007); and Diabetes mellitus without complication.  has past surgical history that includes Inguinal hernia repair (1947); Anterior cruciate ligament repair (5/98); Blepharoplasty (03/2005); and Excision basal cell carcinoma (02/2005). Prior to Admission medications   Medication Sig Start Date End Date Taking? Authorizing Provider  amLODipine (NORVASC) 10 MG tablet Take 10 mg by mouth daily.   Yes Historical Provider, MD  aspirin (BAYER CHILDRENS ASPIRIN) 81 MG chewable tablet Chew 81 mg by mouth daily.     Yes Historical Provider, MD  atorvastatin (LIPITOR) 40  MG tablet Take 40 mg by mouth daily.   Yes Historical Provider, MD  diphenhydrAMINE (BENADRYL) 12.5 MG/5ML elixir Take 25 mg by mouth 4 (four) times daily as needed for allergies.   Yes Historical Provider, MD  EPINEPHrine (EPIPEN 2-PAK) 0.3 mg/0.3 mL DEVI Inject 0.3 mLs (0.3 mg total) into the skin once. Inject 1 pen intramuscularly as directed. 07/07/12  Yes Zachery Dauer, MD  finasteride (PROSCAR) 5 MG tablet Take 5 mg by mouth daily.   Yes Historical Provider, MD  gabapentin (NEURONTIN) 600 MG tablet Take 0.5 tablets (300 mg total) by mouth 3 (three) times daily. 05/28/13  Yes Uvaldo Rising, MD  hydrochlorothiazide (HYDRODIURIL) 25 MG tablet Take one-half tablet by mouth daily 05/24/14  Yes Uvaldo Rising, MD  insulin aspart (NOVOLOG FLEXPEN) 100 UNIT/ML FlexPen Inject 8 units with breakfast, 15 units with lunch, and 15 units with dinner. 07/04/14  Yes Uvaldo Rising, MD  Insulin Glargine (LANTUS SOLOSTAR) 100 UNIT/ML Solostar Pen Inject 45 Units into the skin every morning. 07/04/14  Yes Uvaldo Rising, MD  Liraglutide (VICTOZA) 18 MG/3ML SOPN Inject 1.8 mg into the skin daily. Please dispense one month supply (3 pens) per month supply. 05/13/14  Yes Uvaldo Rising, MD  loratadine (CLARITIN) 10 MG tablet Take 10 mg by mouth daily.     Yes Historical Provider, MD  metoprolol (LOPRESSOR) 100 MG tablet Take 100 mg by mouth 2 (two) times daily.   Yes Historical Provider, MD  omeprazole (PRILOSEC) 20 MG capsule Take 20 mg by mouth daily.   Yes Historical Provider, MD  potassium chloride (KLOR-CON 10) 10 MEQ tablet Take 2 tablets (  20 mEq total) by mouth daily.   Yes Uvaldo Rising, MD  tamsulosin (FLOMAX) 0.4 MG CAPS capsule Take 0.4 mg by mouth.   Yes Historical Provider, MD  atorvastatin (LIPITOR) 40 MG tablet TAKE ONE TABLET BY MOUTH ONE TIME DAILY  Patient not taking: Reported on 07/13/2014 03/17/14   Uvaldo Rising, MD  finasteride (PROSCAR) 5 MG tablet TAKE ONE TABLET BY MOUTH ONE TIME DAILY  Patient not taking:  Reported on 07/13/2014 05/24/14   Uvaldo Rising, MD  glucose blood test strip Check blood sugars 3 times daily. DX. E11.9 07/04/14   Uvaldo Rising, MD  Insulin Pen Needle (BD PEN NEEDLE NANO U/F) 32G X 4 MM MISC Use as instructed by your physician to check blood sugars. 02/15/14   Uvaldo Rising, MD  metoprolol (LOPRESSOR) 100 MG tablet TAKE ONE TABLET BY MOUTH TWICE DAILY  Patient not taking: Reported on 07/13/2014 04/11/14   Uvaldo Rising, MD  omeprazole (PRILOSEC) 20 MG capsule TAKE ONE CAPSULE BY MOUTH ONE TIME DAILY  Patient not taking: Reported on 07/13/2014 03/28/14   Uvaldo Rising, MD  tamsulosin (FLOMAX) 0.4 MG CAPS capsule TAKE ONE CAPSULE BY MOUTH ONE TIME DAILY  Patient not taking: Reported on 07/13/2014 04/11/14   Uvaldo Rising, MD   Allergies  Allergen Reactions  . Ace Inhibitors Other (See Comments)    REACTION: Angioedema  . Calcium Carbonate Antacid Other (See Comments)    REACTION: Angioedema of mouth    FAMILY HISTORY:  family history includes Alzheimer's disease in his mother; Anuerysm in his father; Aortic aneurysm in his brother and father; Diabetes in his brother. SOCIAL HISTORY:  reports that he quit smoking about 47 years ago. His smoking use included Cigarettes. He started smoking about 72 years ago. He has a 10 pack-year smoking history. He has never used smokeless tobacco. He reports that he drinks about 1.0 oz of alcohol per week. He reports that he does not use illicit drugs.  REVIEW OF SYSTEMS:   10 point review of system taken, please see HPI for positives and negatives.   SUBJECTIVE:  NAD VITAL SIGNS: Temp:  [97.9 F (36.6 C)] 97.9 F (36.6 C) (03/16 0225) Pulse Rate:  [88-107] 90 (03/16 0700) Resp:  [11-28] 12 (03/16 0700) BP: (145-168)/(71-103) 145/72 mmHg (03/16 0700) SpO2:  [95 %-100 %] 95 % (03/16 0700)  PHYSICAL EXAMINATION: General:  WNWDWM NAD Neuro:  Intact HEENT:  Anterior neck edema, oral pharynx unremarkable. Good dentation. No  LAN/JVD Cardiovascular:  HSR Lungs:  CTA Abdomen:  Soft +bs Musculoskeletal:  intact Skin:  Lower ext multiple old scarring   Recent Labs Lab 07/13/14 0226  NA 142  K 3.1*  CL 102  BUN 25*  CREATININE 2.00*  GLUCOSE 223*    Recent Labs Lab 07/13/14 0221 07/13/14 0226  HGB 13.2 12.9*  HCT 37.3* 38.0*  WBC 7.4  --   PLT 145*  --    Dg Neck Soft Tissue  07/13/2014   CLINICAL DATA:  Inability to speak, with tongue swelling and difficulty breathing. Acute onset. Initial encounter.  EXAM: NECK SOFT TISSUES - 1+ VIEW  COMPARISON:  CT of the neck performed 03/05/2006  FINDINGS: The nasopharynx, oropharynx and hypopharynx are grossly unremarkable in appearance. There appears to be diffuse thickening of the epiglottis, measuring 1.4 cm, raising concern for epiglottitis. Prevertebral soft tissues are within normal limits. The aryepiglottic folds also appear thickened. The proximal trachea is unremarkable in appearance.  Mild  degenerative change is noted along the cervical spine, with scattered anterior and posterior disc osteophyte complexes, and mild disc space narrowing along the mid cervical spine. The visualized lung apices are grossly clear.  IMPRESSION: Apparent diffuse thickening of the epiglottis, measuring 1.4 cm, raising concern for epiglottitis. Would correlate for associated symptoms.  In light of the patient's recurrent angioedema and lack of infectious symptoms, edema at the epiglottis can occasionally be seen with angioedema, or hereditary angioedema has been known to result in epiglottitis on rare occasion.  These results were called by telephone at the time of interpretation on 07/13/2014 at 4:09 am to Dr. Cy BlamerAPRIL PALUMBO, who verbally acknowledged these results.   Electronically Signed   By: Roanna RaiderJeffery  Chang M.D.   On: 07/13/2014 04:09   Dg Chest Port 1 View  07/13/2014   CLINICAL DATA:  Inability to speak. Swollen tongue and difficulty breathing.  EXAM: PORTABLE CHEST - 1 VIEW   COMPARISON:  01/10/2014  FINDINGS: A single AP portable view of the chest demonstrates no focal airspace consolidation or alveolar edema. The lungs are grossly clear. There is no large effusion or pneumothorax. Cardiac and mediastinal contours appear unremarkable.  IMPRESSION: No active disease.   Electronically Signed   By: Ellery Plunkaniel R Mitchell M.D.   On: 07/13/2014 03:17    ASSESSMENT    Angioedema , recurrent, unknown trigger   Diabetes mellitus   Essential hypertension, benign   GASTROESOPHAGEAL REFLUX DISEASE, CHRONIC   Enlarged prostate with lower urinary tract symptoms (LUTS)    Discussion: 74 yo WM with history of multiple cases of tongue swelling. Taken off ACE-I but multiple attacks since. Previous allergy work up with out triggers identified. He was on Claritin for prophylaxis but stopped 3 weeks ago. 3/15 2200 while at baseball game and eating a chocolate peanut butter candy he noted swelling anterior neck and tongue. He never developed stridor or respiratory distress. Self medicated with benadryl and Epi pen. [facility] and was treated with steroids, repeat Epi pen, H2 blockers. ENT evaluation per Dr. Jenne PaneBates as noted. PCCM will admit for 24 hour observation in ICU. He is stable and in no distress at this time) 0800 07/13/14)  PLAN:  Admit to ICU x 24 hours Continue steroids, H2 blockers SSI Hold antihypertensives for now Sips and chips for now IVF  [facility] ACNP [facility] Pager [phone] till 3 pm If no answer page [phone] 07/13/2014, 7:59 AM  Reviewed above, examined.  74 yo male with prior hx of angioedema presented with tongue and lip swelling with difficulty breathing.  He gave himself epi pen and came to ER.  He was seen by ENT.  Since starting tx in ER he is feeling better, but still has raspy voice.  He reports seeing an allergist 8 or 9 yrs ago (does not recall who this was) >> he was told he has usual allergies.  Specifically no hx of nut allergies.   He had C1 esterase testing done in 07/03/2010 >> 12 mg/dl.  His lungs are clear, no skin rash, abd soft, no edema.  Will check C4, C1 inhibitor level and functional assay.  Continue pepcid, solumedrol.  He will need f/u with allergy/immunology as outpt.  Coralyn HellingVineet Penda Venturi, MD [facility] 07/13/2014, 10:36 AM Pager:  [phone] After 3pm call: [phone]

## 2014-07-13 NOTE — ED Notes (Signed)
Attempted to call report to ICU.

## 2014-07-13 NOTE — Consult Note (Signed)
Reason for Consult:angioedema Referring Physician: ER/CCM  Tanner Mason is an 74 y.o. male.  HPI: 74 year old male with history of recurring tongue swelling to a minor degree of unknown cause.  He has been able to control the problem with Benadryl in the past.  Last night, seemingly after eating a peanut butter cup, he developed severe swelling of the tongue and anterior neck and came to the ER.  Here, he has been treated with Benadryl, Solu-medrol, epinephrine, and Pepcid.  Swelling of the tongue has now nearly resolved but he still feels swelling in the anterior neck.  He never had stridor or a change to the voice.  Past Medical History  Diagnosis Date  . Elbow dislocation 1610,9604    Right  . Treadmill stress test negative for angina pectoris 06/2002    poor HR and BP recovery  . Abdominal ultrasound, abnormal 02/2004    fatty liver  . Encounter for diagnostic endoscopy 10/22/04    negative  . History of esophagogastroduodenoscopy 08/13/2007    normal  . Diabetes mellitus without complication     Past Surgical History  Procedure Laterality Date  . Inguinal hernia repair  1947  . Anterior cruciate ligament repair  5/98    Left, staph infection complicated  . Blepharoplasty  03/2005    with ptosis repair  . Basal cell carcinoma excision  02/2005    R ear    Family History  Problem Relation Age of Onset  . Alzheimer's disease Mother     died age 58  . Aortic aneurysm Father     died age 68  . Anuerysm Father   . Aortic aneurysm Brother     repaired plus AI graft  . Diabetes Brother     Social History:  reports that he quit smoking about 47 years ago. His smoking use included Cigarettes. He started smoking about 72 years ago. He has a 10 pack-year smoking history. He has never used smokeless tobacco. He reports that he drinks about 1.0 oz of alcohol per week. He reports that he does not use illicit drugs.  Allergies:  Allergies  Allergen Reactions  . Ace Inhibitors  Other (See Comments)    REACTION: Angioedema  . Calcium Carbonate Antacid Other (See Comments)    REACTION: Angioedema of mouth    Medications: I have reviewed the patient's current medications.  Results for orders placed or performed during the hospital encounter of 07/13/14 (from the past 48 hour(s))  CBC with Differential     Status: Abnormal   Collection Time: 07/13/14  2:21 AM  Result Value Ref Range   WBC 7.4 4.0 - 10.5 K/uL   RBC 4.07 (L) 4.22 - 5.81 MIL/uL   Hemoglobin 13.2 13.0 - 17.0 g/dL   HCT 54.0 (L) 98.1 - 19.1 %   MCV 91.6 78.0 - 100.0 fL   MCH 32.4 26.0 - 34.0 pg   MCHC 35.4 30.0 - 36.0 g/dL   RDW 47.8 29.5 - 62.1 %   Platelets 145 (L) 150 - 400 K/uL   Neutrophils Relative % 43 43 - 77 %   Neutro Abs 3.2 1.7 - 7.7 K/uL   Lymphocytes Relative 47 (H) 12 - 46 %   Lymphs Abs 3.5 0.7 - 4.0 K/uL   Monocytes Relative 8 3 - 12 %   Monocytes Absolute 0.6 0.1 - 1.0 K/uL   Eosinophils Relative 2 0 - 5 %   Eosinophils Absolute 0.1 0.0 - 0.7 K/uL  Basophils Relative 0 0 - 1 %   Basophils Absolute 0.0 0.0 - 0.1 K/uL  I-Stat Chem 8, ED     Status: Abnormal   Collection Time: 07/13/14  2:26 AM  Result Value Ref Range   Sodium 142 135 - 145 mmol/L   Potassium 3.1 (L) 3.5 - 5.1 mmol/L   Chloride 102 96 - 112 mmol/L   BUN 25 (H) 6 - 23 mg/dL   Creatinine, Ser 3.872.00 (H) 0.50 - 1.35 mg/dL   Glucose, Bld 564223 (H) 70 - 99 mg/dL   Calcium, Ion 3.321.20 9.511.13 - 1.30 mmol/L   TCO2 24 0 - 100 mmol/L   Hemoglobin 12.9 (L) 13.0 - 17.0 g/dL   HCT 88.438.0 (L) 16.639.0 - 06.352.0 %    Dg Neck Soft Tissue  07/13/2014   CLINICAL DATA:  Inability to speak, with tongue swelling and difficulty breathing. Acute onset. Initial encounter.  EXAM: NECK SOFT TISSUES - 1+ VIEW  COMPARISON:  CT of the neck performed 03/05/2006  FINDINGS: The nasopharynx, oropharynx and hypopharynx are grossly unremarkable in appearance. There appears to be diffuse thickening of the epiglottis, measuring 1.4 cm, raising concern for  epiglottitis. Prevertebral soft tissues are within normal limits. The aryepiglottic folds also appear thickened. The proximal trachea is unremarkable in appearance.  Mild degenerative change is noted along the cervical spine, with scattered anterior and posterior disc osteophyte complexes, and mild disc space narrowing along the mid cervical spine. The visualized lung apices are grossly clear.  IMPRESSION: Apparent diffuse thickening of the epiglottis, measuring 1.4 cm, raising concern for epiglottitis. Would correlate for associated symptoms.  In light of the patient's recurrent angioedema and lack of infectious symptoms, edema at the epiglottis can occasionally be seen with angioedema, or hereditary angioedema has been known to result in epiglottitis on rare occasion.  These results were called by telephone at the time of interpretation on 07/13/2014 at 4:09 am to Dr. Cy BlamerAPRIL PALUMBO, who verbally acknowledged these results.   Electronically Signed   By: Roanna RaiderJeffery  Chang M.D.   On: 07/13/2014 04:09   Dg Chest Port 1 View  07/13/2014   CLINICAL DATA:  Inability to speak. Swollen tongue and difficulty breathing.  EXAM: PORTABLE CHEST - 1 VIEW  COMPARISON:  01/10/2014  FINDINGS: A single AP portable view of the chest demonstrates no focal airspace consolidation or alveolar edema. The lungs are grossly clear. There is no large effusion or pneumothorax. Cardiac and mediastinal contours appear unremarkable.  IMPRESSION: No active disease.   Electronically Signed   By: Ellery Plunkaniel R Mitchell M.D.   On: 07/13/2014 03:17    Review of Systems  All other systems reviewed and are negative.  Blood pressure 149/81, pulse 92, temperature 97.9 F (36.6 C), temperature source Oral, resp. rate 15, SpO2 95 %. Physical Exam  Constitutional: He is oriented to person, place, and time. He appears well-developed and well-nourished. No distress.  HENT:  Head: Normocephalic and atraumatic.  Right Ear: External ear normal.  Left Ear:  External ear normal.  Nose: Nose normal.  Normal tongue.  Floor of mouth edema.  Speech slightly slurred.  Normal voice.  No stridor.  Eyes: Conjunctivae and EOM are normal. Pupils are equal, round, and reactive to light.  Neck: Normal range of motion. Neck supple.  Submental neck with indistinct fullness.  Cardiovascular: Normal rate.   Respiratory: Effort normal.  Musculoskeletal: Normal range of motion.  Neurological: He is alert and oriented to person, place, and time. No cranial nerve deficit.  Skin: Skin is warm and dry.  Psychiatric: He has a normal mood and affect. His behavior is normal. Judgment and thought content normal.    Assessment/Plan: Angioedema With medical therapy, tongue swelling has resolved.  There remains floor of mouth and anterior neck edema.  Fiberoptic laryngoscopy was performed at the bedside, see procedure note.  There is non-obstructing edema of the vallecula and left arytenoid.  Agree with planned airway observation.  Should be able to be discharged when edema resolves (he can feel the edema in his throat and should be able to tell when the feeling goes away).  While allergy to a specific exposure such as the peanut cup could be to blame, it seems that this reaction does not occur consistently when he eats peanuts.  The story is more suggestive of hereditary or recurrent angioedema.  I recommended that the patient regroup with his allergist as an outpatient and consider further testing such as C1 esterase inhibitor levels.  Porshea Janowski 07/13/2014, 6:11 AM

## 2014-07-13 NOTE — ED Notes (Signed)
Pt came in w/ inability to speak, swelling to tongue and difficulty breathing.  Pt has had 10 similar reactions in the past however it has been over a year since the last one.  Cause of allergic reaction is still unknown.  Pt injected himself with epi pen approximately 1 hour PTA w/o relief.

## 2014-07-13 NOTE — ED Notes (Signed)
Pt concerned about home medications. Md was made aware and he reports patient to only have  "sips and chips" and to hold hypertensive meds for now. Will continue to monitor patient and blood sugars.

## 2014-07-13 NOTE — Procedures (Signed)
Preop diagnosis: Angioedema Postop diagnosis: same Procedure: Transnasal fiberoptic laryngoscopy Surgeon: Jenne PaneBates Anesth: Topical 2% lidocaine jelly Comp: None Findings: There is mild edema of the left nasopharynx to include the soft palate and eustachian tube opening.  There is moderate edema of the vallecula but no significant edema of the laryngeal surface of the epiglottis.  The left arytenoid is edematous but not obstructing the view of the glottis.  The endolarynx is otherwise without edema. Description: After discussing risks, benefits, and alternatives, the patient was placed in a seated position.  The fiberoptic laryngoscope was coated with lidocaine jelly and was then introduced carefully through the left nasal passage and used to evaluate the pharynx and larynx.  Findings are noted above.  After completion, the laryngoscope was removed and the patient was returned to nursing care in stable condition.

## 2014-07-13 NOTE — ED Notes (Signed)
Pt arrived to the ED with a complaint of an allergic reaction.  Pt states the allergen is unknown.  Pt believes it is peanut butter.  Pt states he is having difficulty talking but is ambulatory.  Pt has given himself an epi pen injection.  Pt has visible neck swelling.

## 2014-07-13 NOTE — ED Notes (Signed)
CBG registered 337 on ED Glucometer.

## 2014-07-13 NOTE — ED Notes (Signed)
Pt ambulating in hallway with steady gait.

## 2014-07-13 NOTE — Progress Notes (Signed)
eLink Physician-Brief Progress Note Patient Name: Tanner Mason DOB: [birth year] MRN: [id number]   Date of Service  07/13/2014  HPI/Events of Note  Request to change to observation status Breathing comfortably RN notes hallucinations  eICU Interventions  D/c solumedrol Change to observation status     Intervention Category Major Interventions: Delirium, psychosis, severe agitation - evaluation and management Minor Interventions: Routine modifications to care plan (e.g. PRN medications for pain, fever)  Tanner Mason 07/13/2014, 4:36 PM

## 2014-07-13 NOTE — ED Provider Notes (Signed)
CSN: [id number]     Arrival date & time 07/13/14  0157 History   First MD Initiated Contact with Patient 07/13/14 0215     Chief Complaint  Patient presents with  . Allergic Reaction     (Consider location/radiation/quality/duration/timing/severity/associated sxs/prior Treatment) Patient is a 74 y.o. male presenting with allergic reaction. The history is provided by the spouse. The history is limited by the condition of the patient (severe angioedema).  Allergic Reaction Presenting symptoms: difficulty swallowing and swelling   Swelling:    Location: tongue and anterior neck.   Onset quality:  Sudden   Timing:  Constant   Progression:  Unchanged   Chronicity:  Recurrent Severity:  Severe Prior episodes: ate a PB cup. Context: food   Relieved by:  Nothing Worsened by:  Nothing tried Ineffective treatments:  Epinephrine and antihistamines   Past Medical History  Diagnosis Date  . Elbow dislocation 7829,5621    Right  . Treadmill stress test negative for angina pectoris 06/2002    poor HR and BP recovery  . Abdominal ultrasound, abnormal 02/2004    fatty liver  . Encounter for diagnostic endoscopy 10/22/04    negative  . History of esophagogastroduodenoscopy 08/13/2007    normal  . Diabetes mellitus without complication    Past Surgical History  Procedure Laterality Date  . Inguinal hernia repair  1947  . Anterior cruciate ligament repair  5/98    Left, staph infection complicated  . Blepharoplasty  03/2005    with ptosis repair  . Basal cell carcinoma excision  02/2005    R ear   Family History  Problem Relation Age of Onset  . Alzheimer's disease Mother     died age 72  . Aortic aneurysm Father     died age 35  . Anuerysm Father   . Aortic aneurysm Brother     repaired plus AI graft  . Diabetes Brother    History  Substance Use Topics  . Smoking status: Former Smoker -- 1.00 packs/day for 10 years    Types: Cigarettes    Start date: [birth year]1942    Quit  date: 06/10/1967  . Smokeless tobacco: Never Used  . Alcohol Use: 1.0 oz/week    2 Standard drinks or equivalent per week     Comment: occasional    Review of Systems  Unable to perform ROS HENT: Positive for trouble swallowing.       Allergies  Ace inhibitors and Calcium carbonate antacid  Home Medications   Prior to Admission medications   Medication Sig Start Date End Date Taking? Authorizing Provider  amLODipine (NORVASC) 10 MG tablet Take 10 mg by mouth daily.   Yes Historical Provider, MD  aspirin (BAYER CHILDRENS ASPIRIN) 81 MG chewable tablet Chew 81 mg by mouth daily.     Yes Historical Provider, MD  atorvastatin (LIPITOR) 40 MG tablet Take 40 mg by mouth daily.   Yes Historical Provider, MD  diphenhydrAMINE (BENADRYL) 12.5 MG/5ML elixir Take 25 mg by mouth 4 (four) times daily as needed for allergies.   Yes Historical Provider, MD  EPINEPHrine (EPIPEN 2-PAK) 0.3 mg/0.3 mL DEVI Inject 0.3 mLs (0.3 mg total) into the skin once. Inject 1 pen intramuscularly as directed. 07/07/12  Yes Zachery Dauer, MD  finasteride (PROSCAR) 5 MG tablet Take 5 mg by mouth daily.   Yes Historical Provider, MD  gabapentin (NEURONTIN) 600 MG tablet Take 0.5 tablets (300 mg total) by mouth 3 (three) times daily. 05/28/13  Yes Ronnell Freshwater  Randolm Idol, MD  hydrochlorothiazide (HYDRODIURIL) 25 MG tablet Take one-half tablet by mouth daily 05/24/14  Yes Uvaldo Rising, MD  insulin aspart (NOVOLOG FLEXPEN) 100 UNIT/ML FlexPen Inject 8 units with breakfast, 15 units with lunch, and 15 units with dinner. 07/04/14  Yes Uvaldo Rising, MD  Insulin Glargine (LANTUS SOLOSTAR) 100 UNIT/ML Solostar Pen Inject 45 Units into the skin every morning. 07/04/14  Yes Uvaldo Rising, MD  Liraglutide (VICTOZA) 18 MG/3ML SOPN Inject 1.8 mg into the skin daily. Please dispense one month supply (3 pens) per month supply. 05/13/14  Yes Uvaldo Rising, MD  loratadine (CLARITIN) 10 MG tablet Take 10 mg by mouth daily.     Yes Historical Provider, MD   metoprolol (LOPRESSOR) 100 MG tablet Take 100 mg by mouth 2 (two) times daily.   Yes Historical Provider, MD  omeprazole (PRILOSEC) 20 MG capsule Take 20 mg by mouth daily.   Yes Historical Provider, MD  potassium chloride (KLOR-CON 10) 10 MEQ tablet Take 2 tablets (20 mEq total) by mouth daily.   Yes Uvaldo Rising, MD  tamsulosin (FLOMAX) 0.4 MG CAPS capsule Take 0.4 mg by mouth.   Yes Historical Provider, MD  atorvastatin (LIPITOR) 40 MG tablet TAKE ONE TABLET BY MOUTH ONE TIME DAILY  Patient not taking: Reported on 07/13/2014 03/17/14   Uvaldo Rising, MD  finasteride (PROSCAR) 5 MG tablet TAKE ONE TABLET BY MOUTH ONE TIME DAILY  Patient not taking: Reported on 07/13/2014 05/24/14   Uvaldo Rising, MD  glucose blood test strip Check blood sugars 3 times daily. DX. E11.9 07/04/14   Uvaldo Rising, MD  Insulin Pen Needle (BD PEN NEEDLE NANO U/F) 32G X 4 MM MISC Use as instructed by your physician to check blood sugars. 02/15/14   Uvaldo Rising, MD  metoprolol (LOPRESSOR) 100 MG tablet TAKE ONE TABLET BY MOUTH TWICE DAILY  Patient not taking: Reported on 07/13/2014 04/11/14   Uvaldo Rising, MD  omeprazole (PRILOSEC) 20 MG capsule TAKE ONE CAPSULE BY MOUTH ONE TIME DAILY  Patient not taking: Reported on 07/13/2014 03/28/14   Uvaldo Rising, MD  tamsulosin (FLOMAX) 0.4 MG CAPS capsule TAKE ONE CAPSULE BY MOUTH ONE TIME DAILY  Patient not taking: Reported on 07/13/2014 04/11/14   Uvaldo Rising, MD   BP 149/81 mmHg  Pulse 92  Temp(Src) 97.9 F (36.6 C) (Oral)  Resp 15  SpO2 95% Physical Exam  Constitutional: He appears well-developed and well-nourished.  HENT:  Head: Normocephalic and atraumatic.  Mouth/Throat: No trismus in the jaw.  Swelling of the tongue and the floor of the mouth opalescent.  Swelling anterior neck  Eyes: Conjunctivae are normal. Pupils are equal, round, and reactive to light.  Neck: Normal range of motion. Neck supple.  Cardiovascular: Normal rate, regular rhythm and intact  distal pulses.   Pulmonary/Chest: Effort normal and breath sounds normal. No stridor. He has no wheezes. He has no rales.  Abdominal: Soft. Bowel sounds are normal. There is no tenderness. There is no rebound and no guarding.  Musculoskeletal: Normal range of motion.  Lymphadenopathy:    He has no cervical adenopathy.  Neurological: He is alert. He has normal reflexes.  Skin: Skin is warm and dry. He is not diaphoretic. No erythema.  Psychiatric: He has a normal mood and affect.    ED Course  Procedures (including critical care time) Labs Review Labs Reviewed  CBC WITH DIFFERENTIAL/PLATELET - Abnormal; Notable for the following:  RBC 4.07 (*)    HCT 37.3 (*)    Platelets 145 (*)    Lymphocytes Relative 47 (*)    All other components within normal limits  I-STAT CHEM 8, ED - Abnormal; Notable for the following:    Potassium 3.1 (*)    BUN 25 (*)    Creatinine, Ser 2.00 (*)    Glucose, Bld 223 (*)    Hemoglobin 12.9 (*)    HCT 38.0 (*)    All other components within normal limits    Imaging Review Dg Neck Soft Tissue  07/13/2014   CLINICAL DATA:  Inability to speak, with tongue swelling and difficulty breathing. Acute onset. Initial encounter.  EXAM: NECK SOFT TISSUES - 1+ VIEW  COMPARISON:  CT of the neck performed 03/05/2006  FINDINGS: The nasopharynx, oropharynx and hypopharynx are grossly unremarkable in appearance. There appears to be diffuse thickening of the epiglottis, measuring 1.4 cm, raising concern for epiglottitis. Prevertebral soft tissues are within normal limits. The aryepiglottic folds also appear thickened. The proximal trachea is unremarkable in appearance.  Mild degenerative change is noted along the cervical spine, with scattered anterior and posterior disc osteophyte complexes, and mild disc space narrowing along the mid cervical spine. The visualized lung apices are grossly clear.  IMPRESSION: Apparent diffuse thickening of the epiglottis, measuring 1.4 cm,  raising concern for epiglottitis. Would correlate for associated symptoms.  In light of the patient's recurrent angioedema and lack of infectious symptoms, edema at the epiglottis can occasionally be seen with angioedema, or hereditary angioedema has been known to result in epiglottitis on rare occasion.  These results were called by telephone at the time of interpretation on 07/13/2014 at 4:09 am to Dr. Cy Blamer, who verbally acknowledged these results.   Electronically Signed   By: Roanna Raider M.D.   On: 07/13/2014 04:09   Dg Chest Port 1 View  07/13/2014   CLINICAL DATA:  Inability to speak. Swollen tongue and difficulty breathing.  EXAM: PORTABLE CHEST - 1 VIEW  COMPARISON:  01/10/2014  FINDINGS: A single AP portable view of the chest demonstrates no focal airspace consolidation or alveolar edema. The lungs are grossly clear. There is no large effusion or pneumothorax. Cardiac and mediastinal contours appear unremarkable.  IMPRESSION: No active disease.   Electronically Signed   By: Ellery Plunk M.D.   On: 07/13/2014 03:17     EKG Interpretation   Date/Time:  Wednesday July 13 2014 02:05:28 EDT Ventricular Rate:  104 PR Interval:  176 QRS Duration: 80 QT Interval:  351 QTC Calculation: 462 R Axis:   4 Text Interpretation:  Sinus tachycardia Confirmed by [facility]  MD,  Morene Antu ([id number]) on 07/13/2014 2:16:04 AM      MDM   Final diagnoses:  Allergic reaction  Angioedema, initial encounter  CRITICAL CARE Performed by: Jasmine Awe Total critical care time: 120 minutes Critical care time was exclusive of separately billable procedures and treating other patients. Critical care was necessary to treat or prevent imminent or life-threatening deterioration. Critical care was time spent personally by me on the following activities: development of treatment plan with patient and/or surrogate as well as nursing, discussions with consultants, evaluation of patient's  response to treatment, examination of patient, obtaining history from patient or surrogate, ordering and performing treatments and interventions, ordering and review of laboratory studies, ordering and review of radiographic studies, pulse oximetry and re-evaluation of patient's condition.  Medications  famotidine (PEPCID) IVPB 20 mg (0 mg Intravenous Stopped 07/13/14 0231)  methylPREDNISolone sodium succinate (SOLU-MEDROL) 125 mg/2 mL injection 125 mg (125 mg Intravenous Given 07/13/14 0217)  EPINEPHrine (EPI-PEN) injection 0.3 mg (0.3 mg Intramuscular Given 07/13/14 0218)  diphenhydrAMINE (BENADRYL) 50 MG/ML injection (50 mg  Given 07/13/14 0217)  diphenhydrAMINE (BENADRYL) injection 25 mg (25 mg Intravenous Given 07/13/14 0356)    MDM Number of Diagnoses or Management Options Allergic reaction:  Angioedema, initial encounter:  MDM Reviewed: nursing note and vitals Interpretation: x-ray, labs and ECG (swelling of epiglottis by me) Consults: critical care (ENT per Dr. Jenne PaneBates laryngoscope and light source.  )      Admit to ICU, per Dr. Marchelle Gearingamaswamy Call Dr. Jenne PaneBates again  Chatara Lucente, MD 07/13/14 [phone]

## 2014-07-14 DIAGNOSIS — T783XXD Angioneurotic edema, subsequent encounter: Secondary | ICD-10-CM

## 2014-07-14 LAB — LEGIONELLA ANTIGEN, URINE

## 2014-07-14 LAB — CBC
HCT: 32.8 % — ABNORMAL LOW (ref 39.0–52.0)
Hemoglobin: 11.5 g/dL — ABNORMAL LOW (ref 13.0–17.0)
MCH: 32 pg (ref 26.0–34.0)
MCHC: 35.1 g/dL (ref 30.0–36.0)
MCV: 91.4 fL (ref 78.0–100.0)
Platelets: 129 10*3/uL — ABNORMAL LOW (ref 150–400)
RBC: 3.59 MIL/uL — ABNORMAL LOW (ref 4.22–5.81)
RDW: 14 % (ref 11.5–15.5)
WBC: 7.3 10*3/uL (ref 4.0–10.5)

## 2014-07-14 LAB — C4 COMPLEMENT: Complement C4, Body Fluid: 31 mg/dL (ref 14–44)

## 2014-07-14 LAB — BASIC METABOLIC PANEL
Anion gap: 13 (ref 5–15)
BUN: 23 mg/dL (ref 6–23)
CO2: 22 mmol/L (ref 19–32)
Calcium: 8.6 mg/dL (ref 8.4–10.5)
Chloride: 105 mmol/L (ref 96–112)
Creatinine, Ser: 1.56 mg/dL — ABNORMAL HIGH (ref 0.50–1.35)
GFR calc Af Amer: 49 mL/min — ABNORMAL LOW (ref >90)
GFR calc non Af Amer: 43 mL/min — ABNORMAL LOW (ref >90)
Glucose, Bld: 198 mg/dL — ABNORMAL HIGH (ref 70–99)
Potassium: 3.6 mmol/L (ref 3.5–5.1)
Sodium: 140 mmol/L (ref 135–145)

## 2014-07-14 LAB — GLUCOSE, CAPILLARY
Glucose-Capillary: 323 mg/dL — ABNORMAL HIGH (ref 70–99)
Glucose-Capillary: 210 mg/dL — ABNORMAL HIGH (ref 70–99)
Glucose-Capillary: 201 mg/dL — ABNORMAL HIGH (ref 70–99)

## 2014-07-14 LAB — C1 ESTERASE INHIBITOR: C1INH SerPl-mCnc: 26 mg/dL (ref 21–39)

## 2014-07-14 NOTE — Care Management Note (Signed)
CARE MANAGEMENT NOTE 07/14/2014  Patient:  Tanner Mason,Tanner Mason   Account Number:  [id number]  Date Initiated:  07/14/2014  Documentation initiated by:  DAVIS,RHONDA  Subjective/Objective Assessment:   angioedema and swelling of the tongue after consuming peanuts.     Action/Plan:   home   Anticipated DC Date:  07/14/2014   Anticipated DC Plan:  HOME/SELF CARE  In-house referral  NA      DC Planning Services  NA      PAC Choice  NA   Choice offered to / List presented to:  NA   DME arranged  NA      DME agency  NA     HH arranged  NA      HH agency  NA   Status of service:  Completed, signed off Medicare Important Message given?  NA - LOS <3 / Initial given by admissions (If response is "NO", the following Medicare IM given date fields will be blank) Date Medicare IM given:   Medicare IM given by:   Date Additional Medicare IM given:   Additional Medicare IM given by:    Discharge Disposition:  HOME/SELF CARE  Per UR Regulation:  Reviewed for med. necessity/level of care/duration of stay  If discussed at Long Length of Stay Meetings, dates discussed:    Comments:  July 14, 2014/Rhonda L. Earlene Plateravis, RN, BSN, CCM. Case Management [employer] [phone] No discharge needs present of time of review.

## 2014-07-14 NOTE — Discharge Summary (Signed)
Physician Discharge Summary  Patient ID: DAIKI DICOSTANZO MRN: [id number] DOB/AGE: Nov 04, 1940 74 y.o.  Admit date: 07/13/2014 Discharge date: 07/14/2014  Problem List Active Problems:   Diabetes mellitus   Essential hypertension, benign   GASTROESOPHAGEAL REFLUX DISEASE, CHRONIC   Enlarged prostate with lower urinary tract symptoms (LUTS)   Angioedema   Angio-edema  HPI: 74 yo WM with history of multiple cases of tongue swelling. Taken off ACE-I but multiple attacks since. Previous allergy work up with out triggers identified. He was on Claritin for prophylaxis but stopped 3 weeks ago. 3/15 2200 while at baseball game and eating a chocolate peanut butter candy he noted swelling anterior neck and tongue. He never developed stridor or respiratory distress. Self medicated with benadryl and Epi pen. Presented to [facility] ED and was treated with steroids, repeat Epi pen, H2 blockers. ENT evaluation per Dr. Jenne Pane as noted. PCCM will admit for 24 hour observation in ICU. He is stable and in no distress at this time) 0800 07/13/14)  Hospital Course:  ASSESSMENT   Angioedema , recurrent, unknown trigger  Diabetes mellitus  Essential hypertension, benign  GASTROESOPHAGEAL REFLUX DISEASE, CHRONIC  Enlarged prostate with lower urinary tract symptoms (LUTS)    Discussion: 74 yo WM with history of multiple cases of tongue swelling. Taken off ACE-I but multiple attacks since. Previous allergy work up with out triggers identified. He was on Claritin for prophylaxis but stopped 3 weeks ago. 3/15 2200 while at baseball game and eating a chocolate peanut butter candy he noted swelling anterior neck and tongue. He never developed stridor or respiratory distress. Self medicated with benadryl and Epi pen. Presented to [facility] ED and was treated with steroids, repeat Epi pen, H2 blockers. ENT evaluation per Dr. Jenne Pane as noted. PCCM will admit for 24 hour observation in ICU. He is stable and in no distress at  this time) 0800 07/13/14)  PLAN:  Admit to ICU x 24 hours Continue steroids, H2 blockers SSI Hold antihypertensives for now Sips and chips for now IVF 07/14/14 all facial and lingual edema has resolved. He will discharged to home. Follow up with PCP, Dr. Senaida Lange MD to whom a copy of this dc summary was sent.    Labs at discharge Lab Results  Component Value Date   CREATININE 1.56* 07/14/2014   BUN 23 07/14/2014   NA 140 07/14/2014   K 3.6 07/14/2014   CL 105 07/14/2014   CO2 22 07/14/2014   Lab Results  Component Value Date   WBC 7.3 07/14/2014   HGB 11.5* 07/14/2014   HCT 32.8* 07/14/2014   MCV 91.4 07/14/2014   PLT 129* 07/14/2014   Lab Results  Component Value Date   ALT 33 07/13/2014   AST 29 07/13/2014   ALKPHOS 75 07/13/2014   BILITOT 1.1 07/13/2014   Lab Results  Component Value Date   INR 1.06 07/13/2014    Current radiology studies Dg Neck Soft Tissue  07/13/2014   CLINICAL DATA:  Inability to speak, with tongue swelling and difficulty breathing. Acute onset. Initial encounter.  EXAM: NECK SOFT TISSUES - 1+ VIEW  COMPARISON:  CT of the neck performed 03/05/2006  FINDINGS: The nasopharynx, oropharynx and hypopharynx are grossly unremarkable in appearance. There appears to be diffuse thickening of the epiglottis, measuring 1.4 cm, raising concern for epiglottitis. Prevertebral soft tissues are within normal limits. The aryepiglottic folds also appear thickened. The proximal trachea is unremarkable in appearance.  Mild degenerative change is noted along the cervical spine,  with scattered anterior and posterior disc osteophyte complexes, and mild disc space narrowing along the mid cervical spine. The visualized lung apices are grossly clear.  IMPRESSION: Apparent diffuse thickening of the epiglottis, measuring 1.4 cm, raising concern for epiglottitis. Would correlate for associated symptoms.  In light of the patient's recurrent angioedema and lack of infectious  symptoms, edema at the epiglottis can occasionally be seen with angioedema, or hereditary angioedema has been known to result in epiglottitis on rare occasion.  These results were called by telephone at the time of interpretation on 07/13/2014 at 4:09 am to Dr. Cy Blamer, who verbally acknowledged these results.   Electronically Signed   By: Roanna Raider M.D.   On: 07/13/2014 04:09   Dg Chest Port 1 View  07/13/2014   CLINICAL DATA:  Inability to speak. Swollen tongue and difficulty breathing.  EXAM: PORTABLE CHEST - 1 VIEW  COMPARISON:  01/10/2014  FINDINGS: A single AP portable view of the chest demonstrates no focal airspace consolidation or alveolar edema. The lungs are grossly clear. There is no large effusion or pneumothorax. Cardiac and mediastinal contours appear unremarkable.  IMPRESSION: No active disease.   Electronically Signed   By: Ellery Plunk M.D.   On: 07/13/2014 03:17    Disposition:  Final discharge disposition not confirmed  Discharge Instructions    Discharge patient    Complete by:  As directed             Medication List    TAKE these medications        amLODipine 10 MG tablet  Commonly known as:  NORVASC  Take 10 mg by mouth daily.     atorvastatin 40 MG tablet  Commonly known as:  LIPITOR  TAKE ONE TABLET BY MOUTH ONE TIME DAILY     BAYER CHILDRENS ASPIRIN 81 MG chewable tablet  Generic drug:  aspirin  Chew 81 mg by mouth daily.     diphenhydrAMINE 12.5 MG/5ML elixir  Commonly known as:  BENADRYL  Take 25 mg by mouth 4 (four) times daily as needed for allergies.     EPINEPHrine 0.3 mg/0.3 mL Devi  Commonly known as:  EPIPEN 2-PAK  Inject 0.3 mLs (0.3 mg total) into the skin once. Inject 1 pen intramuscularly as directed.     finasteride 5 MG tablet  Commonly known as:  PROSCAR  TAKE ONE TABLET BY MOUTH ONE TIME DAILY     gabapentin 600 MG tablet  Commonly known as:  NEURONTIN  Take 0.5 tablets (300 mg total) by mouth 3 (three) times  daily.     glucose blood test strip  Check blood sugars 3 times daily. DX. E11.9     insulin aspart 100 UNIT/ML FlexPen  Commonly known as:  NOVOLOG FLEXPEN  Inject 8 units with breakfast, 15 units with lunch, and 15 units with dinner.     Insulin Glargine 100 UNIT/ML Solostar Pen  Commonly known as:  LANTUS SOLOSTAR  Inject 45 Units into the skin every morning.     Insulin Pen Needle 32G X 4 MM Misc  Commonly known as:  BD PEN NEEDLE NANO U/F  Use as instructed by your physician to check blood sugars.     Liraglutide 18 MG/3ML Sopn  Commonly known as:  VICTOZA  Inject 1.8 mg into the skin daily. Please dispense one month supply (3 pens) per month supply.     loratadine 10 MG tablet  Commonly known as:  CLARITIN  Take 10 mg by  mouth daily.     metoprolol 100 MG tablet  Commonly known as:  LOPRESSOR  TAKE ONE TABLET BY MOUTH TWICE DAILY     omeprazole 20 MG capsule  Commonly known as:  PRILOSEC  TAKE ONE CAPSULE BY MOUTH ONE TIME DAILY     potassium chloride 10 MEQ tablet  Commonly known as:  KLOR-CON 10  Take 2 tablets (20 mEq total) by mouth daily.     tamsulosin 0.4 MG Caps capsule  Commonly known as:  FLOMAX  TAKE ONE CAPSULE BY MOUTH ONE TIME DAILY      ASK your doctor about these medications        hydrochlorothiazide 25 MG tablet  Commonly known as:  HYDRODIURIL  Take one-half tablet by mouth daily          Discharged Condition: good  Time spent on discharge greater than 40 minutes.  Vital signs at Discharge. Temp:  [97.5 F (36.4 C)-98.8 F (37.1 C)] 97.5 F (36.4 C) (03/17 0800) Pulse Rate:  [88-100] 89 (03/17 0810) Resp:  [14-22] 14 (03/17 0810) BP: (125-162)/(66-90) 162/76 mmHg (03/17 0810) SpO2:  [97 %-100 %] 99 % (03/17 0810) Weight:  [164 lb 0.4 oz (74.4 kg)-165 lb (74.844 kg)] 164 lb 0.4 oz (74.4 kg) (03/17 0400) Office follow up Special Information or instructions. He will follow up with his primary care MD and allergist in near  future. No need to follow up with PCCM. Signed: Brett CanalesSteve Minor ACNP [facility] P[facility] Pager [phone] till 3 pm If no answer page [phone] 07/14/2014, 11:06 AM  Physician Statement:    Can dc today , no stridor FU labs as outpt with PCP -  C1 esterase inhibitor levels Allergist re-eval as outpt [facility] has epi RX  The Patient was personally examined, the discharge assessment and plan has been personally reviewed and I agree with ACNP Minor's assessment and plan. > 30 minutes of time have been dedicated to discharge assessment, planning and discharge instructions.    Oretha MilchALVA,Krysten Veronica V. MD

## 2014-07-14 NOTE — Progress Notes (Signed)
Can dc today , no stridor FU labs as outpt with PCP - [facility] Allergist appt [facility] has epi RX  Tanner MilchALVA,Tanner V. MD

## 2014-07-15 ENCOUNTER — Encounter: Payer: Self-pay | Admitting: Family Medicine

## 2014-07-15 LAB — GLUCOSE, CAPILLARY: Glucose-Capillary: 203 mg/dL — ABNORMAL HIGH (ref 70–99)

## 2014-07-15 LAB — C1 ESTERASE INHIBITOR, FUNCTIONAL: C1INH Functional/C1INH Total MFr SerPl: 88 % (ref >=68)

## 2014-07-18 ENCOUNTER — Telehealth: Payer: Self-pay | Admitting: Family Medicine

## 2014-07-18 ENCOUNTER — Other Ambulatory Visit: Payer: Self-pay | Admitting: Family Medicine

## 2014-07-18 DIAGNOSIS — T783XXA Angioneurotic edema, initial encounter: Secondary | ICD-10-CM

## 2014-07-18 NOTE — Telephone Encounter (Signed)
Pt called and needs a refill on his Epi-Pen. The patient would also like a referral to an allergist. Myriam Jacobsonjw

## 2014-07-19 MED ORDER — EPINEPHRINE 0.3 MG/0.3ML IJ SOAJ: 0.3000 mg | Freq: Once | INTRAMUSCULAR | Status: DC

## 2014-07-19 NOTE — Telephone Encounter (Signed)
Patient informed of message from Dr. Gwendolyn GrantWalden, would like Dr. Randolm IdolFletke to call him about allergy referral when he returns.

## 2014-07-19 NOTE — Telephone Encounter (Signed)
I'll refill the patient's Epipen.  However he should discuss the allergy referral with his PCP once Dr. Randolm IdolFletke returns next week.

## 2014-07-26 NOTE — Addendum Note (Signed)
Addended by: Uvaldo RisingFLETKE, Dalis Beers J on: 07/26/2014 04:50 PM   Modules accepted: Orders

## 2014-07-26 NOTE — Telephone Encounter (Signed)
Attempted to contact patient with no answer, left message that I will call him again tomorrow.

## 2014-07-26 NOTE — Telephone Encounter (Signed)
Spoke with patient. Had recent hospitalization for angioedema. He requests referral to allergy/immunology. Placed referral. No further symptoms.

## 2014-08-11 ENCOUNTER — Other Ambulatory Visit: Payer: Self-pay | Admitting: Family Medicine

## 2014-08-14 ENCOUNTER — Other Ambulatory Visit: Payer: Self-pay | Admitting: Family Medicine

## 2014-08-19 ENCOUNTER — Other Ambulatory Visit: Payer: Self-pay | Admitting: Family Medicine

## 2014-08-20 ENCOUNTER — Other Ambulatory Visit: Payer: Self-pay | Admitting: Family Medicine

## 2014-09-18 ENCOUNTER — Other Ambulatory Visit: Payer: Self-pay | Admitting: Family Medicine

## 2014-10-02 ENCOUNTER — Other Ambulatory Visit: Payer: Self-pay | Admitting: Family Medicine

## 2014-10-23 ENCOUNTER — Other Ambulatory Visit: Payer: Self-pay | Admitting: Family Medicine

## 2014-10-24 ENCOUNTER — Other Ambulatory Visit: Payer: Self-pay

## 2014-11-08 ENCOUNTER — Ambulatory Visit (INDEPENDENT_AMBULATORY_CARE_PROVIDER_SITE_OTHER): Payer: Medicare Other | Admitting: Family Medicine

## 2014-11-08 ENCOUNTER — Encounter: Payer: Self-pay | Admitting: Family Medicine

## 2014-11-08 VITALS — BP 129/71 | HR 74 | Temp 97.6°F | Ht 67.0 in | Wt 163.0 lb

## 2014-11-08 DIAGNOSIS — Z23 Encounter for immunization: Secondary | ICD-10-CM

## 2014-11-08 DIAGNOSIS — E1143 Type 2 diabetes mellitus with diabetic autonomic (poly)neuropathy: Secondary | ICD-10-CM

## 2014-11-08 DIAGNOSIS — E876 Hypokalemia: Secondary | ICD-10-CM

## 2014-11-08 DIAGNOSIS — Z0000 Encounter for general adult medical examination without abnormal findings: Secondary | ICD-10-CM

## 2014-11-08 DIAGNOSIS — N183 Chronic kidney disease, stage 3 unspecified: Secondary | ICD-10-CM

## 2014-11-08 DIAGNOSIS — E1122 Type 2 diabetes mellitus with diabetic chronic kidney disease: Secondary | ICD-10-CM

## 2014-11-08 DIAGNOSIS — E119 Type 2 diabetes mellitus without complications: Secondary | ICD-10-CM

## 2014-11-08 LAB — LIPID PANEL
Cholesterol: 95 mg/dL (ref 0–200)
HDL: 29 mg/dL — ABNORMAL LOW (ref >=40)
LDL Cholesterol: 1 mg/dL (ref 0–99)
Total CHOL/HDL Ratio: 3.3 Ratio
Triglycerides: 324 mg/dL — ABNORMAL HIGH (ref <150)
VLDL: 65 mg/dL — ABNORMAL HIGH (ref 0–40)

## 2014-11-08 LAB — BASIC METABOLIC PANEL
BUN: 24 mg/dL — ABNORMAL HIGH (ref 6–23)
CO2: 28 mEq/L (ref 19–32)
Calcium: 9.2 mg/dL (ref 8.4–10.5)
Chloride: 101 mEq/L (ref 96–112)
Creat: 1.73 mg/dL — ABNORMAL HIGH (ref 0.50–1.35)
Glucose, Bld: 152 mg/dL — ABNORMAL HIGH (ref 70–99)
Potassium: 3.5 mEq/L (ref 3.5–5.3)
Sodium: 141 mEq/L (ref 135–145)

## 2014-11-08 LAB — GLUCOSE, CAPILLARY: Glucose-Capillary: 158 mg/dL — ABNORMAL HIGH (ref 65–99)

## 2014-11-08 LAB — POCT GLYCOSYLATED HEMOGLOBIN (HGB A1C): Hemoglobin A1C: 5.6

## 2014-11-08 NOTE — Assessment & Plan Note (Signed)
Preventative healthcare reviewed. Patient due for 13 valent pneumonia and TD vaccines which were provided today.

## 2014-11-08 NOTE — Assessment & Plan Note (Signed)
Controlled on gabapentin 300 mg 3 times a day.

## 2014-11-08 NOTE — Progress Notes (Signed)
Subjective:     Patient ID: Tanner Mason, male   DOB: Jun 16, 1940, 74 y.o.   MRN: [id number]  HPI  74 year old male presents for routine follow-up of insulin-dependent type 2 diabetes.  History of present illness documented in black font was scribed by Bing QuarryJohannes Du Pisanie MS 3.  DIABETES  Patient states that his sugars are higher in the mornings before he eats breakfast. He states that he is worried that his numbers have started to creep upward. He is using the one touch meter and thinks that it may a be meter issue. This is a new meter as of a few months ago. He is using 50 of the lantus if his readings are high. He usually uses 45 of the lantus daily. He is using 8 of the novolog in the morning because he is working out. Then he is using 15 for lunch and 15 for dinner. He has had some jitters right before noon before he ate when his blood sugar was in the 80's. He is having them more often lately. He is also still taking his victoza. States that he is still going to the gym in the mornings.  Peripheral neuropathy-patient currently taking gabapentin 300 mg 2-3 times daily, reports feeling of numbness on the bottom of feet, no associated pain  HEALTH MAINTENANCE  Patient is due for 13 balance pneumonia, to get, and lipid profile.  Social-patient is a nonsmoker   Review of Systems  Constitutional: Negative for fever, chills and fatigue.  Respiratory: Negative for cough, choking and shortness of breath.   Cardiovascular: Negative for chest pain and leg swelling.  Gastrointestinal: Negative for nausea, vomiting and diarrhea.       Objective:   Physical Exam Vitals: Reviewed General: Pleasant Caucasian male, no acute distress HEENT: Normocephalic, pupils are equal round and reactive to light, extraocular movements are intact, no scleral icterus, moist mucous membranes, neck supple Cardiac: Regular rate and rhythm, S1 and S2 present, no murmurs, no heaves or thrills Respiratory:  Clear to auscultation bilaterally, normal effort Extremities: No edema Diabetic foot exam completed and documented in the quality metric section  Point-of-care A1c was 5.6.    Assessment:     Please see problem specific assessment and plan    Plan:   Please see problem specific assessment and plan

## 2014-11-08 NOTE — Assessment & Plan Note (Signed)
Well-controlled based on A1c. However patient is having fluctuations of blood sugars. Blood glucose monitor was [facility] glucose meter and readings were relatively the same. -Increase Lantus to 50 units nightly to help combat high fasting blood sugars -Decrease mealtime NovoLog to 10/09/10 to help combat intermittent hypoglycemia -Continue home Victoza

## 2014-11-08 NOTE — Assessment & Plan Note (Signed)
Patient on chronic potassium supplementation. Check BMP today to monitor potassium level.

## 2014-11-08 NOTE — Patient Instructions (Signed)
Diabetes - Increase Lantus to 50 units in the morning. Decrease Novlog to 6 units with breakfast, 12 units with lunch and dinner  Neuropathy - continue Gabapentin 300 mg three times a day  Blood pressure - well controlled  Kidneys - check lab work today, Dr. Randolm IdolFletke will call you with those results  Nursing staff will give you pneumonia and tetanus shot.

## 2014-11-16 ENCOUNTER — Other Ambulatory Visit: Payer: Self-pay | Admitting: Family Medicine

## 2014-12-29 ENCOUNTER — Other Ambulatory Visit: Payer: Self-pay | Admitting: Family Medicine

## 2015-01-12 ENCOUNTER — Other Ambulatory Visit: Payer: Self-pay | Admitting: Family Medicine

## 2015-01-15 ENCOUNTER — Other Ambulatory Visit: Payer: Self-pay | Admitting: Family Medicine

## 2015-02-04 ENCOUNTER — Other Ambulatory Visit: Payer: Self-pay | Admitting: Family Medicine

## 2015-02-08 ENCOUNTER — Other Ambulatory Visit: Payer: Self-pay | Admitting: Family Medicine

## 2015-02-13 ENCOUNTER — Other Ambulatory Visit: Payer: Self-pay | Admitting: Family Medicine

## 2015-02-23 ENCOUNTER — Encounter: Payer: Self-pay | Admitting: Family Medicine

## 2015-02-23 ENCOUNTER — Ambulatory Visit (INDEPENDENT_AMBULATORY_CARE_PROVIDER_SITE_OTHER): Payer: Medicare Other | Admitting: Family Medicine

## 2015-02-23 VITALS — BP 134/71 | HR 85 | Temp 97.9°F | Ht 67.0 in | Wt 167.2 lb

## 2015-02-23 DIAGNOSIS — E1143 Type 2 diabetes mellitus with diabetic autonomic (poly)neuropathy: Secondary | ICD-10-CM | POA: Diagnosis not present

## 2015-02-23 DIAGNOSIS — E133219 Other specified diabetes mellitus with mild nonproliferative diabetic retinopathy with macular edema, unspecified eye: Secondary | ICD-10-CM | POA: Diagnosis not present

## 2015-02-23 DIAGNOSIS — I10 Essential (primary) hypertension: Secondary | ICD-10-CM

## 2015-02-23 LAB — POCT GLYCOSYLATED HEMOGLOBIN (HGB A1C): Hemoglobin A1C: 6.4

## 2015-02-23 NOTE — Assessment & Plan Note (Signed)
Controlled. A1C 6.4 today. AM blood sugars slightly elevated.  -continue Lantus 50 units daily -increase Novolog to 12/09/10 -patient to check blood sugars throughout the day and send in via Mychart (adjust insulin accordingly) -Patient insurance requiring all insulin to be from [employer] (will need to check with pharmacy to see if medication changes are needed)

## 2015-02-23 NOTE — Patient Instructions (Addendum)
It was nice to see you today.  Please increase Novolog to 8 units with breakfast, continue 12 units with lunch and dinner.  Please continue lantus 50 units daily.  Dr. Randolm IdolFletke will check with my pharmacy about [employer] products.  Call in blood sugars from breakfast + lunch/dinner/bed to Dr. Randolm IdolFletke  Your lightheadedness is call orthostatic hypotension.

## 2015-02-23 NOTE — Assessment & Plan Note (Signed)
Controlled. Occasional symptoms of orthostatic hypotension. -continue current regimen -follow orthostatic hypotension (return precautions given)

## 2015-02-23 NOTE — Assessment & Plan Note (Signed)
Stable -continue Gabapentin 600 mg 2-3 times daily

## 2015-02-23 NOTE — Progress Notes (Signed)
   Subjective:    Patient ID: Tanner Mason, male    DOB: Oct 11, 1940, 74 y.o.   MRN: [id number]  HPI 74 y/o male presents for follow up of DM and HTN  IDT2DM - currently on Lantus 50 units daily and Novolog 10/09/10 with meals, and Victoza, had one episode of hypoglycemia yesterday (glucose 68, no clear inciting event), AM sugars running any where from 139 to 285 over past 2 weeks, does not check at other times of day, has upcoming eye appointment in December, goes to gym 5 days per week (weights + swimming).  Peripheral neuropathy - continues to have numbness in feet, stable with Gabapentin, does report some unsteadiness at times (no dizziness/chest pain).  HTN - taking Norvasc 10 mg, Metoprolol 100 mg BID and HCTZ 25 mg daily, reports occasional symptoms consistent with orthostatic hypotension (occurs when standing too fast, no loc, no syncope, no chest pain), no headaches, no vision changes  Social - lives with wife   Review of Systems  Constitutional: Negative for fever, chills and fatigue.  Respiratory: Negative for cough and shortness of breath.   Cardiovascular: Negative for chest pain and leg swelling.  Gastrointestinal: Negative for nausea, vomiting and diarrhea.       Objective:   Physical Exam Vitals: reviewed Gen: pleasant male, NAD Cardiac: RRR, S1 and S2 present, no murmur Resp: CTAB, normal effort Ext: no edema, 2+ radial pulses Skin: see quality metrics for foot examination  Reviewed labs from July 2016     Assessment & Plan:  Essential hypertension, benign Controlled. Occasional symptoms of orthostatic hypotension. -continue current regimen -follow orthostatic hypotension (return precautions given)  Diabetes mellitus Controlled. A1C 6.4 today. AM blood sugars slightly elevated.  -continue Lantus 50 units daily -increase Novolog to 12/09/10 -patient to check blood sugars throughout the day and send in via Mychart (adjust insulin accordingly) -Patient  insurance requiring all insulin to be from [employer] (will need to check with pharmacy to see if medication changes are needed)  Peripheral autonomic neuropathy due to diabetes mellitus Stable -continue Gabapentin 600 mg 2-3 times daily

## 2015-02-28 ENCOUNTER — Other Ambulatory Visit: Payer: Self-pay | Admitting: Family Medicine

## 2015-03-02 ENCOUNTER — Telehealth: Payer: Self-pay | Admitting: Pharmacist

## 2015-03-02 DIAGNOSIS — E0821 Diabetes mellitus due to underlying condition with diabetic nephropathy: Secondary | ICD-10-CM

## 2015-03-02 DIAGNOSIS — Z794 Long term (current) use of insulin: Secondary | ICD-10-CM

## 2015-03-02 MED ORDER — INSULIN LISPRO 100 UNIT/ML (KWIKPEN): 8.0000 [IU] | PEN_INJECTOR | Freq: Three times a day (TID) | SUBCUTANEOUS | Status: DC

## 2015-03-02 NOTE — Telephone Encounter (Signed)
Noted and agree. 

## 2015-03-02 NOTE — Telephone Encounter (Signed)
Patient instructed by pharmacist that his insurance coverage would change for his Novolog to a [employer] product (Humalog).   Called by Renata CapriceMeredith Tilley, PharmD Candidate.   Patient willing to switch from Novolog to Humalog at a 1:1 conversion.  Sent New Rx for Humalog Pens to his pharmacy.   Follow Up with Dr. Randolm IdolFletke PRN.     Phone call to patient's pharmacy - clarified that this one switch was the only one needed.   Both Victoza and Lantus are covered on his plan.

## 2015-03-05 ENCOUNTER — Other Ambulatory Visit: Payer: Self-pay | Admitting: Family Medicine

## 2015-03-07 ENCOUNTER — Other Ambulatory Visit: Payer: Self-pay | Admitting: Family Medicine

## 2015-03-10 ENCOUNTER — Other Ambulatory Visit: Payer: Self-pay | Admitting: Family Medicine

## 2015-04-03 LAB — HM DIABETES EYE EXAM

## 2015-04-10 ENCOUNTER — Telehealth: Payer: Self-pay | Admitting: Family Medicine

## 2015-04-10 DIAGNOSIS — Z794 Long term (current) use of insulin: Secondary | ICD-10-CM

## 2015-04-10 DIAGNOSIS — E0821 Diabetes mellitus due to underlying condition with diabetic nephropathy: Secondary | ICD-10-CM

## 2015-04-10 NOTE — Telephone Encounter (Signed)
Pt would like to speak to Dr. Randolm IdolFletke about his numbers from his blood sugar readings. He said he used My Chart in November but never heard back from the doctor. He would like to give the reading to Dr. Randolm IdolFletke. jw

## 2015-04-11 MED ORDER — INSULIN LISPRO 100 UNIT/ML (KWIKPEN)
PEN_INJECTOR | SUBCUTANEOUS | Status: DC
Start: 2015-04-11 — End: 2015-10-09

## 2015-04-11 NOTE — Telephone Encounter (Signed)
Spoke to patient. Fasting blood sugars in the 200's; before lunch and dinner in the high 100's.   Increase Novolog to 02/09/13 with meals.

## 2015-04-20 ENCOUNTER — Other Ambulatory Visit: Payer: Self-pay | Admitting: Family Medicine

## 2015-04-20 ENCOUNTER — Other Ambulatory Visit: Payer: Self-pay | Admitting: *Deleted

## 2015-04-20 NOTE — Telephone Encounter (Signed)
Received refill request for Tamsulosin 0.4 mg.  Requesting to resend Rx; there fill of the hard copy got deleted.  Please advise.  Clovis PuMartin, Camreigh Michie L, RN

## 2015-04-20 NOTE — Telephone Encounter (Signed)
Called pharmacy and they already have filled the prescription.

## 2015-05-17 ENCOUNTER — Other Ambulatory Visit: Payer: Self-pay | Admitting: Family Medicine

## 2015-05-17 MED ORDER — FINASTERIDE 5 MG PO TABS: 5.0000 mg | ORAL_TABLET | Freq: Every day | ORAL | Status: DC

## 2015-06-01 ENCOUNTER — Other Ambulatory Visit: Payer: Self-pay | Admitting: Family Medicine

## 2015-06-28 ENCOUNTER — Other Ambulatory Visit: Payer: Self-pay | Admitting: Family Medicine

## 2015-07-14 ENCOUNTER — Ambulatory Visit (INDEPENDENT_AMBULATORY_CARE_PROVIDER_SITE_OTHER): Payer: Medicare Other | Admitting: Family Medicine

## 2015-07-14 ENCOUNTER — Encounter: Payer: Self-pay | Admitting: Family Medicine

## 2015-07-14 VITALS — BP 116/66 | HR 97 | Temp 98.6°F | Ht 67.0 in | Wt 168.0 lb

## 2015-07-14 DIAGNOSIS — E1143 Type 2 diabetes mellitus with diabetic autonomic (poly)neuropathy: Secondary | ICD-10-CM

## 2015-07-14 DIAGNOSIS — L0291 Cutaneous abscess, unspecified: Secondary | ICD-10-CM

## 2015-07-14 DIAGNOSIS — Z794 Long term (current) use of insulin: Secondary | ICD-10-CM | POA: Diagnosis not present

## 2015-07-14 DIAGNOSIS — I10 Essential (primary) hypertension: Secondary | ICD-10-CM

## 2015-07-14 DIAGNOSIS — L02212 Cutaneous abscess of back [any part, except buttock]: Secondary | ICD-10-CM | POA: Insufficient documentation

## 2015-07-14 DIAGNOSIS — L0390 Cellulitis, unspecified: Secondary | ICD-10-CM

## 2015-07-14 DIAGNOSIS — Z0000 Encounter for general adult medical examination without abnormal findings: Secondary | ICD-10-CM | POA: Diagnosis not present

## 2015-07-14 DIAGNOSIS — E0821 Diabetes mellitus due to underlying condition with diabetic nephropathy: Secondary | ICD-10-CM | POA: Diagnosis not present

## 2015-07-14 HISTORY — DX: Cutaneous abscess, unspecified: L02.91

## 2015-07-14 HISTORY — DX: Cellulitis, unspecified: L03.90

## 2015-07-14 LAB — BASIC METABOLIC PANEL
BUN: 23 mg/dL (ref 7–25)
CO2: 28 mmol/L (ref 20–31)
Calcium: 9.5 mg/dL (ref 8.6–10.3)
Chloride: 104 mmol/L (ref 98–110)
Creat: 2.01 mg/dL — ABNORMAL HIGH (ref 0.70–1.18)
Glucose, Bld: 114 mg/dL — ABNORMAL HIGH (ref 65–99)
Potassium: 3.5 mmol/L (ref 3.5–5.3)
Sodium: 143 mmol/L (ref 135–146)

## 2015-07-14 LAB — POCT GLYCOSYLATED HEMOGLOBIN (HGB A1C): Hemoglobin A1C: 6.4

## 2015-07-14 MED ORDER — GABAPENTIN 600 MG PO TABS: 600.0000 mg | ORAL_TABLET | Freq: Three times a day (TID) | ORAL | Status: DC

## 2015-07-14 MED ORDER — DOXYCYCLINE HYCLATE 50 MG PO CAPS: 100.0000 mg | ORAL_CAPSULE | Freq: Two times a day (BID) | ORAL | Status: DC

## 2015-07-14 NOTE — Assessment & Plan Note (Signed)
Boil of left upper back. -wound culture obtained -start Doxycyline 100 mg BID for 5 days -apply warm compresses to promote drainage

## 2015-07-14 NOTE — Assessment & Plan Note (Signed)
Controlled. -continue Metoprolol, Amlodipine, and HCTZ -patient has allergy to ACE-I (angioedema)

## 2015-07-14 NOTE — Assessment & Plan Note (Signed)
Patient continues to have bilateral foot numbness. Minimal pain. -increase Gabapentin to 600 mg BID (can go up to TID if needed)

## 2015-07-14 NOTE — Progress Notes (Signed)
   Subjective:    Patient ID: Tanner Mason, male    DOB: 1940/09/12, 75 y.o.   MRN: [id number]  HPI 75 y/o male presents for Diabetes Follow up.  IDT2DM - prescribed Lantus 50 units and Humalog 12/09/10 (was taking 14 at lunch/dinner). Also on Victoza 1.8 mg daily. Mostly 200's to 300's  Diabetic Neuropahty - on Gabapentin 300 mg TID, no change in symptoms (mostly numbness of feet)   HTN - taking Amlodipine 10 mg daily, HCTZ 25 mg daily, and Lopressor 100 mg BID, has lightheaded spell today when sitting up in gym (only has happened once in the past few weeks)  ?boil on back - had something similar 10 years ago (needed lance), present for a few weeks, painful, no fluid coming out, no fevers/chills  HM - up to date  Allergies - history of angioedema with ACE-I  FH of AAA - US in 2005 negative  Social - nonsmoker   Review of Systems  Constitutional: Negative for fever, chills and fatigue.  Respiratory: Negative for cough and shortness of breath.   Cardiovascular: Negative for chest pain and leg swelling.  Gastrointestinal: Negative for nausea, vomiting and diarrhea.       Objective:   Physical Exam BP 116/66 mmHg  Pulse 97  Temp(Src) 98.6 F (37 C) (Oral)  Ht 5\' 7"  (1.702 m)  Wt 168 lb (76.204 kg)  BMI 26.31 kg/m2 Gen: pleasant male, NAD Cardiac: RRR, S1 and S2 present, no murmur, no heaves/thrills Resp: CTAB, normal effort Abd: soft, no tenderness, NBS Skin: boil of left upper back, draining pus, culture obtained Ext: no edema  POC A1C 6.4      Assessment & Plan:  Diabetes mellitus Controlled per A1C of 6.4. AM Fasting sugars elevated.  -continue Lantus 50 units daily, Humalog 12/10/12, and Victoza -patient to call in blood sugars (lunch, dinner, bed) and may adjust insulin as needed.  -Up to date on foot and eye exams.   Essential hypertension, benign Controlled. -continue Metoprolol, Amlodipine, and HCTZ -patient has allergy to ACE-I  (angioedema)  Peripheral autonomic neuropathy due to diabetes mellitus Patient continues to have bilateral foot numbness. Minimal pain. -increase Gabapentin to 600 mg BID (can go up to TID if needed)  Preventative health care Family history of AAA. -aortic US ordered to check for AAA  Cellulitis and abscess Boil of left upper back. -wound culture obtained -start Doxycyline 100 mg BID for 5 days -apply warm compresses to promote drainage

## 2015-07-14 NOTE — Assessment & Plan Note (Signed)
Controlled per A1C of 6.4. AM Fasting sugars elevated.  -continue Lantus 50 units daily, Humalog 12/10/12, and Victoza -patient to call in blood sugars (lunch, dinner, bed) and may adjust insulin as needed.  -Up to date on foot and eye exams.

## 2015-07-14 NOTE — Patient Instructions (Addendum)
It was nice to see you today.  Diabetes - well controlled, keep taking lantus 50 units, victoza 1.8 mg and Humulog 8 units with breakfast, 14 with lunch and dinner. Please check at lunch/dinner/bed and send in results via Mychart in 2-3 weeks.   Nerve pain - increase Gabapentin to 600 mg twice daily (can go up to 600 three times per day).   Blood pressure - continue current medications, please let me know if you have anymore lightheaded spells  Boil - start Doxycycline daily for 7 days, Dr. Randolm IdolFletke will call you with the culture results. Apply warm compress 2-3 times per day.

## 2015-07-14 NOTE — Assessment & Plan Note (Signed)
Family history of AAA. -aortic US ordered to check for AAA

## 2015-07-15 ENCOUNTER — Other Ambulatory Visit: Payer: Self-pay | Admitting: Family Medicine

## 2015-07-15 LAB — MICROALBUMIN, URINE: Microalb, Ur: 6.9 mg/dL

## 2015-07-17 ENCOUNTER — Telehealth: Payer: Self-pay | Admitting: Family Medicine

## 2015-07-17 DIAGNOSIS — N183 Chronic kidney disease, stage 3 (moderate): Principal | ICD-10-CM

## 2015-07-17 DIAGNOSIS — E1122 Type 2 diabetes mellitus with diabetic chronic kidney disease: Secondary | ICD-10-CM

## 2015-07-17 LAB — WOUND CULTURE
Gram Stain: NONE SEEN
Gram Stain: NONE SEEN
Organism ID, Bacteria: NO GROWTH

## 2015-07-17 NOTE — Assessment & Plan Note (Signed)
Cr. Elevated to 2.01. Previously seen by Dr. Lowell GuitarPowell. Not a candidate for ACE-I due to angioedema. Will recheck in 3 months at next visit.

## 2015-07-17 NOTE — Telephone Encounter (Signed)
Discussed microalbumin, wound culture, and BMP results. CR elevated. Not a candidate for ACE-I due to history of angioedema. Will recheck Cr in 3 months. Consider return visit to renal (previously seen by Dr. Lowell GuitarPowell).

## 2015-07-25 ENCOUNTER — Other Ambulatory Visit: Payer: Self-pay | Admitting: Family Medicine

## 2015-07-28 ENCOUNTER — Other Ambulatory Visit: Payer: Self-pay | Admitting: Family Medicine

## 2015-07-28 ENCOUNTER — Ambulatory Visit (HOSPITAL_COMMUNITY)
Admission: RE | Admit: 2015-07-28 | Discharge: 2015-07-28 | Disposition: A | Discharge status: Home / Self Care | Payer: Medicare Other | Source: Ambulatory Visit | Attending: Family Medicine | Admitting: Family Medicine

## 2015-07-28 DIAGNOSIS — Z8489 Family history of other specified conditions: Secondary | ICD-10-CM | POA: Insufficient documentation

## 2015-07-28 DIAGNOSIS — Z0000 Encounter for general adult medical examination without abnormal findings: Secondary | ICD-10-CM

## 2015-07-31 ENCOUNTER — Other Ambulatory Visit: Payer: Self-pay | Admitting: Family Medicine

## 2015-08-04 ENCOUNTER — Other Ambulatory Visit: Payer: Self-pay | Admitting: Family Medicine

## 2015-09-16 DIAGNOSIS — E782 Mixed hyperlipidemia: Secondary | ICD-10-CM | POA: Insufficient documentation

## 2015-09-16 DIAGNOSIS — E1142 Type 2 diabetes mellitus with diabetic polyneuropathy: Secondary | ICD-10-CM | POA: Insufficient documentation

## 2015-09-17 ENCOUNTER — Inpatient Hospital Stay (HOSPITAL_COMMUNITY)
Admission: EM | Admit: 2015-09-17 | Discharge: 2015-09-26 | DRG: 096 | Disposition: A | Discharge status: Rehabilitation Facility | Payer: Medicare Other | Attending: Family Medicine | Admitting: Family Medicine

## 2015-09-17 ENCOUNTER — Encounter (HOSPITAL_COMMUNITY): Payer: Self-pay

## 2015-09-17 DIAGNOSIS — Z7982 Long term (current) use of aspirin: Secondary | ICD-10-CM | POA: Diagnosis not present

## 2015-09-17 DIAGNOSIS — E1142 Type 2 diabetes mellitus with diabetic polyneuropathy: Secondary | ICD-10-CM | POA: Diagnosis present

## 2015-09-17 DIAGNOSIS — E1122 Type 2 diabetes mellitus with diabetic chronic kidney disease: Secondary | ICD-10-CM | POA: Diagnosis present

## 2015-09-17 DIAGNOSIS — R531 Weakness: Secondary | ICD-10-CM

## 2015-09-17 DIAGNOSIS — M21372 Foot drop, left foot: Secondary | ICD-10-CM | POA: Diagnosis present

## 2015-09-17 DIAGNOSIS — E785 Hyperlipidemia, unspecified: Secondary | ICD-10-CM | POA: Diagnosis present

## 2015-09-17 DIAGNOSIS — M50321 Other cervical disc degeneration at C4-C5 level: Secondary | ICD-10-CM | POA: Diagnosis present

## 2015-09-17 DIAGNOSIS — K219 Gastro-esophageal reflux disease without esophagitis: Secondary | ICD-10-CM | POA: Diagnosis present

## 2015-09-17 DIAGNOSIS — N183 Chronic kidney disease, stage 3 (moderate): Secondary | ICD-10-CM | POA: Diagnosis present

## 2015-09-17 DIAGNOSIS — G8929 Other chronic pain: Secondary | ICD-10-CM | POA: Diagnosis present

## 2015-09-17 DIAGNOSIS — M2578 Osteophyte, vertebrae: Secondary | ICD-10-CM | POA: Diagnosis present

## 2015-09-17 DIAGNOSIS — Z85828 Personal history of other malignant neoplasm of skin: Secondary | ICD-10-CM | POA: Diagnosis not present

## 2015-09-17 DIAGNOSIS — I10 Essential (primary) hypertension: Secondary | ICD-10-CM | POA: Diagnosis present

## 2015-09-17 DIAGNOSIS — K5901 Slow transit constipation: Secondary | ICD-10-CM | POA: Diagnosis not present

## 2015-09-17 DIAGNOSIS — K5900 Constipation, unspecified: Secondary | ICD-10-CM | POA: Diagnosis not present

## 2015-09-17 DIAGNOSIS — E0821 Diabetes mellitus due to underlying condition with diabetic nephropathy: Secondary | ICD-10-CM | POA: Diagnosis not present

## 2015-09-17 DIAGNOSIS — E1140 Type 2 diabetes mellitus with diabetic neuropathy, unspecified: Secondary | ICD-10-CM | POA: Diagnosis not present

## 2015-09-17 DIAGNOSIS — Z6824 Body mass index (BMI) 24.0-24.9, adult: Secondary | ICD-10-CM | POA: Diagnosis not present

## 2015-09-17 DIAGNOSIS — N189 Chronic kidney disease, unspecified: Secondary | ICD-10-CM | POA: Diagnosis not present

## 2015-09-17 DIAGNOSIS — Z833 Family history of diabetes mellitus: Secondary | ICD-10-CM | POA: Diagnosis not present

## 2015-09-17 DIAGNOSIS — E663 Overweight: Secondary | ICD-10-CM | POA: Diagnosis present

## 2015-09-17 DIAGNOSIS — M5116 Intervertebral disc disorders with radiculopathy, lumbar region: Secondary | ICD-10-CM | POA: Diagnosis present

## 2015-09-17 DIAGNOSIS — Z820 Family history of epilepsy and other diseases of the nervous system: Secondary | ICD-10-CM

## 2015-09-17 DIAGNOSIS — F419 Anxiety disorder, unspecified: Secondary | ICD-10-CM | POA: Diagnosis present

## 2015-09-17 DIAGNOSIS — R269 Unspecified abnormalities of gait and mobility: Secondary | ICD-10-CM

## 2015-09-17 DIAGNOSIS — M545 Low back pain: Secondary | ICD-10-CM | POA: Diagnosis not present

## 2015-09-17 DIAGNOSIS — E1143 Type 2 diabetes mellitus with diabetic autonomic (poly)neuropathy: Secondary | ICD-10-CM | POA: Diagnosis present

## 2015-09-17 DIAGNOSIS — D649 Anemia, unspecified: Secondary | ICD-10-CM | POA: Diagnosis not present

## 2015-09-17 DIAGNOSIS — N289 Disorder of kidney and ureter, unspecified: Secondary | ICD-10-CM | POA: Diagnosis not present

## 2015-09-17 DIAGNOSIS — R29898 Other symptoms and signs involving the musculoskeletal system: Secondary | ICD-10-CM

## 2015-09-17 DIAGNOSIS — Z794 Long term (current) use of insulin: Secondary | ICD-10-CM

## 2015-09-17 DIAGNOSIS — R2681 Unsteadiness on feet: Secondary | ICD-10-CM | POA: Diagnosis not present

## 2015-09-17 DIAGNOSIS — K639 Disease of intestine, unspecified: Secondary | ICD-10-CM | POA: Diagnosis not present

## 2015-09-17 DIAGNOSIS — I129 Hypertensive chronic kidney disease with stage 1 through stage 4 chronic kidney disease, or unspecified chronic kidney disease: Secondary | ICD-10-CM | POA: Diagnosis present

## 2015-09-17 DIAGNOSIS — G610 Guillain-Barre syndrome: Principal | ICD-10-CM | POA: Insufficient documentation

## 2015-09-17 DIAGNOSIS — T783XXA Angioneurotic edema, initial encounter: Secondary | ICD-10-CM | POA: Diagnosis not present

## 2015-09-17 DIAGNOSIS — M4806 Spinal stenosis, lumbar region: Secondary | ICD-10-CM | POA: Diagnosis present

## 2015-09-17 DIAGNOSIS — E876 Hypokalemia: Secondary | ICD-10-CM | POA: Diagnosis present

## 2015-09-17 DIAGNOSIS — Z87891 Personal history of nicotine dependence: Secondary | ICD-10-CM

## 2015-09-17 DIAGNOSIS — E119 Type 2 diabetes mellitus without complications: Secondary | ICD-10-CM

## 2015-09-17 DIAGNOSIS — M549 Dorsalgia, unspecified: Secondary | ICD-10-CM | POA: Insufficient documentation

## 2015-09-17 DIAGNOSIS — D696 Thrombocytopenia, unspecified: Secondary | ICD-10-CM | POA: Diagnosis not present

## 2015-09-17 LAB — CBC WITH DIFFERENTIAL/PLATELET
Basophils Absolute: 0 10*3/uL (ref 0.0–0.1)
Basophils Relative: 0 %
Eosinophils Absolute: 0.1 10*3/uL (ref 0.0–0.7)
Eosinophils Relative: 2 %
HCT: 32.7 % — ABNORMAL LOW (ref 39.0–52.0)
Hemoglobin: 11.1 g/dL — ABNORMAL LOW (ref 13.0–17.0)
Lymphocytes Relative: 26 %
Lymphs Abs: 1.4 10*3/uL (ref 0.7–4.0)
MCH: 29.6 pg (ref 26.0–34.0)
MCHC: 33.9 g/dL (ref 30.0–36.0)
MCV: 87.2 fL (ref 78.0–100.0)
Monocytes Absolute: 0.4 10*3/uL (ref 0.1–1.0)
Monocytes Relative: 7 %
Neutro Abs: 3.4 10*3/uL (ref 1.7–7.7)
Neutrophils Relative %: 65 %
Platelets: 156 10*3/uL (ref 150–400)
RBC: 3.75 MIL/uL — ABNORMAL LOW (ref 4.22–5.81)
RDW: 14.3 % (ref 11.5–15.5)
WBC: 5.4 10*3/uL (ref 4.0–10.5)

## 2015-09-17 LAB — URINE MICROSCOPIC-ADD ON
Bacteria, UA: NONE SEEN
RBC / HPF: NONE SEEN RBC/hpf (ref 0–5)
Squamous Epithelial / LPF: NONE SEEN
WBC, UA: NONE SEEN WBC/hpf (ref 0–5)

## 2015-09-17 LAB — COMPREHENSIVE METABOLIC PANEL
ALT: 29 U/L (ref 17–63)
AST: 21 U/L (ref 15–41)
Albumin: 3.6 g/dL (ref 3.5–5.0)
Alkaline Phosphatase: 75 U/L (ref 38–126)
Anion gap: 10 (ref 5–15)
BUN: 27 mg/dL — ABNORMAL HIGH (ref 6–20)
CO2: 27 mmol/L (ref 22–32)
Calcium: 9 mg/dL (ref 8.9–10.3)
Chloride: 100 mmol/L — ABNORMAL LOW (ref 101–111)
Creatinine, Ser: 1.85 mg/dL — ABNORMAL HIGH (ref 0.61–1.24)
GFR calc Af Amer: 40 mL/min — ABNORMAL LOW (ref >60)
GFR calc non Af Amer: 34 mL/min — ABNORMAL LOW (ref >60)
Glucose, Bld: 201 mg/dL — ABNORMAL HIGH (ref 65–99)
Potassium: 2.9 mmol/L — ABNORMAL LOW (ref 3.5–5.1)
Sodium: 137 mmol/L (ref 135–145)
Total Bilirubin: 1 mg/dL (ref 0.3–1.2)
Total Protein: 7.1 g/dL (ref 6.5–8.1)

## 2015-09-17 LAB — URINALYSIS, ROUTINE W REFLEX MICROSCOPIC
Bilirubin Urine: NEGATIVE
Glucose, UA: NEGATIVE mg/dL
Ketones, ur: NEGATIVE mg/dL
Leukocytes, UA: NEGATIVE
Nitrite: NEGATIVE
Protein, ur: NEGATIVE mg/dL
Specific Gravity, Urine: 1.006 (ref 1.005–1.030)
pH: 6.5 (ref 5.0–8.0)

## 2015-09-17 LAB — GLUCOSE, CAPILLARY
Glucose-Capillary: 135 mg/dL — ABNORMAL HIGH (ref 65–99)
Glucose-Capillary: 224 mg/dL — ABNORMAL HIGH (ref 65–99)

## 2015-09-17 LAB — TSH: TSH: 1.323 u[IU]/mL (ref 0.350–4.500)

## 2015-09-17 LAB — MAGNESIUM: Magnesium: 1.7 mg/dL (ref 1.7–2.4)

## 2015-09-17 LAB — VITAMIN B12: Vitamin B-12: 471 pg/mL (ref 180–914)

## 2015-09-17 MED ORDER — ACETAMINOPHEN 325 MG PO TABS
650.0000 mg | ORAL_TABLET | Freq: Four times a day (QID) | ORAL | Status: DC | PRN
  Administered 2015-09-17 – 2015-09-20 (×4): 650 mg via ORAL
  Filled 2015-09-17 (×5): qty 2

## 2015-09-17 MED ORDER — GABAPENTIN 300 MG PO CAPS
600.0000 mg | ORAL_CAPSULE | Freq: Two times a day (BID) | ORAL | Status: DC
Start: 2015-09-17 — End: 2015-09-26
  Administered 2015-09-17 – 2015-09-26 (×18): 600 mg via ORAL
  Filled 2015-09-17 (×18): qty 2

## 2015-09-17 MED ORDER — POTASSIUM CHLORIDE 10 MEQ/100ML IV SOLN
10.0000 meq | INTRAVENOUS | Status: DC
  Administered 2015-09-17: 10 meq via INTRAVENOUS
  Filled 2015-09-17 (×2): qty 100

## 2015-09-17 MED ORDER — ATORVASTATIN CALCIUM 40 MG PO TABS
40.0000 mg | ORAL_TABLET | Freq: Every day | ORAL | Status: DC
  Administered 2015-09-17 – 2015-09-26 (×9): 40 mg via ORAL
  Filled 2015-09-17 (×9): qty 1

## 2015-09-17 MED ORDER — AMLODIPINE BESYLATE 10 MG PO TABS
10.0000 mg | ORAL_TABLET | Freq: Every day | ORAL | Status: DC
Start: 2015-09-18 — End: 2015-09-26
  Administered 2015-09-18 – 2015-09-26 (×8): 10 mg via ORAL
  Filled 2015-09-17 (×9): qty 1

## 2015-09-17 MED ORDER — INSULIN ASPART 100 UNIT/ML ~~LOC~~ SOLN
0.0000 [IU] | Freq: Every day | SUBCUTANEOUS | Status: DC
  Administered 2015-09-17: 2 [IU] via SUBCUTANEOUS
  Administered 2015-09-18: 3 [IU] via SUBCUTANEOUS
  Administered 2015-09-19: 5 [IU] via SUBCUTANEOUS
  Administered 2015-09-20: 3 [IU] via SUBCUTANEOUS

## 2015-09-17 MED ORDER — PANTOPRAZOLE SODIUM 20 MG PO TBEC
20.0000 mg | DELAYED_RELEASE_TABLET | Freq: Every day | ORAL | Status: DC
  Administered 2015-09-18 – 2015-09-26 (×9): 20 mg via ORAL
  Filled 2015-09-17 (×9): qty 1

## 2015-09-17 MED ORDER — POTASSIUM CHLORIDE 10 MEQ/100ML IV SOLN
10.0000 meq | INTRAVENOUS | Status: AC
  Administered 2015-09-17 – 2015-09-18 (×3): 10 meq via INTRAVENOUS
  Filled 2015-09-17 (×3): qty 100

## 2015-09-17 MED ORDER — POTASSIUM CHLORIDE 10 MEQ/100ML IV SOLN
10.0000 meq | INTRAVENOUS | Status: DC
  Administered 2015-09-17 (×2): 10 meq via INTRAVENOUS
  Filled 2015-09-17: qty 100

## 2015-09-17 MED ORDER — SODIUM CHLORIDE 0.9 % IV BOLUS (SEPSIS)
1000.0000 mL | Freq: Once | INTRAVENOUS | Status: AC
  Administered 2015-09-17: 1000 mL via INTRAVENOUS

## 2015-09-17 MED ORDER — INSULIN ASPART 100 UNIT/ML ~~LOC~~ SOLN
0.0000 [IU] | Freq: Three times a day (TID) | SUBCUTANEOUS | Status: DC
  Administered 2015-09-17: 1 [IU] via SUBCUTANEOUS
  Administered 2015-09-18: 9 [IU] via SUBCUTANEOUS
  Administered 2015-09-18: 5 [IU] via SUBCUTANEOUS
  Administered 2015-09-18: 3 [IU] via SUBCUTANEOUS
  Administered 2015-09-19 (×2): 7 [IU] via SUBCUTANEOUS
  Administered 2015-09-19: 9 [IU] via SUBCUTANEOUS
  Administered 2015-09-20: 7 [IU] via SUBCUTANEOUS
  Administered 2015-09-20: 5 [IU] via SUBCUTANEOUS
  Administered 2015-09-20: 9 [IU] via SUBCUTANEOUS

## 2015-09-17 MED ORDER — METOPROLOL TARTRATE 100 MG PO TABS
100.0000 mg | ORAL_TABLET | Freq: Two times a day (BID) | ORAL | Status: DC
  Administered 2015-09-17 – 2015-09-26 (×17): 100 mg via ORAL
  Filled 2015-09-17 (×18): qty 1

## 2015-09-17 MED ORDER — ACETAMINOPHEN 650 MG RE SUPP: 650.0000 mg | Freq: Four times a day (QID) | RECTAL | Status: DC | PRN

## 2015-09-17 MED ORDER — TAMSULOSIN HCL 0.4 MG PO CAPS
0.4000 mg | ORAL_CAPSULE | Freq: Every day | ORAL | Status: DC
  Administered 2015-09-18 – 2015-09-26 (×9): 0.4 mg via ORAL
  Filled 2015-09-17 (×9): qty 1

## 2015-09-17 MED ORDER — ASPIRIN 81 MG PO CHEW
81.0000 mg | CHEWABLE_TABLET | Freq: Every day | ORAL | Status: DC
  Administered 2015-09-18 – 2015-09-25 (×8): 81 mg via ORAL
  Filled 2015-09-17 (×8): qty 1

## 2015-09-17 MED ORDER — FINASTERIDE 5 MG PO TABS
5.0000 mg | ORAL_TABLET | Freq: Every day | ORAL | Status: DC
  Administered 2015-09-18 – 2015-09-25 (×8): 5 mg via ORAL
  Filled 2015-09-17 (×8): qty 1

## 2015-09-17 NOTE — ED Notes (Signed)
Pt oob and walks with flatfoot, plodding gait with poor balance. Dr. Jeraldine LootsLockwood aware.

## 2015-09-17 NOTE — ED Notes (Signed)
Seen [facility] yesterday for increased weakness. Had MRI in [place] yesterday and ruled out stroke but think this numbness and weakness related to bulging disc in neck and back. Alert and oriented

## 2015-09-17 NOTE — ED Provider Notes (Signed)
CSN: [id number]     Arrival date & time 09/17/15  1048 History   First MD Initiated Contact with Patient 09/17/15 1100     Chief Complaint  Patient presents with  . unable to walk     HPI  Patient patient presents with concern of extremity weakness. Patient has had numbness for about 2 days, but over the past 24 hours has had increasing weakness in his upper and lower extremities, and today, was having substantial difficulty with stairs secondary to lower extremity weakness, bilaterally. No new urinary concerns, no fever, chills, chest pain, dyspnea. No recent fall, trauma. Patient has seen neurosurgery in the past, for lumbar spine disc issue, no cervical interventions. Yesterday the patient was seen at several facilities, and eventually had [place] another state. He was told he needed to follow-up upon return home, and today presents for evaluation, with the aforementioned concerns.   Past Medical History  Diagnosis Date  . Elbow dislocation [id number]    Right  . Treadmill stress test negative for angina pectoris 06/2002    poor HR and BP recovery  . Abdominal ultrasound, abnormal 02/2004    fatty liver  . Encounter for diagnostic endoscopy 10/22/04    negative  . History of esophagogastroduodenoscopy 08/13/2007    normal  . Diabetes mellitus without complication [facility](HCC)    Past Surgical History  Procedure Laterality Date  . Inguinal hernia repair  1947  . Anterior cruciate ligament repair  5/98    Left, staph infection complicated  . Blepharoplasty  03/2005    with ptosis repair  . Basal cell carcinoma excision  02/2005    R ear   Family History  Problem Relation Age of Onset  . Alzheimer's disease Mother     died age 73  . Aortic aneurysm Father     died age 73  . Anuerysm Father   . Aortic aneurysm Brother     repaired plus AI graft  . Diabetes Brother    Social History  Substance Use Topics  . Smoking status: Former Smoker -- 1.00 packs/day for 10  years    Types: Cigarettes    Start date: 1940/07[birth year]    Quit date: 06/10/1967  . Smokeless tobacco: Never Used  . Alcohol Use: 1.0 oz/week    2 Standard drinks or equivalent per week     Comment: occasional    Review of Systems  Constitutional:       Per HPI, otherwise negative  HENT:       Per HPI, otherwise negative  Respiratory:       Per HPI, otherwise negative  Cardiovascular:       Per HPI, otherwise negative  Gastrointestinal: Negative for vomiting.  Endocrine:       Negative aside from HPI  Genitourinary:       Neg aside from HPI   Musculoskeletal:       Per HPI, otherwise negative  Skin: Negative.   Neurological: Positive for weakness and numbness. Negative for seizures, syncope and speech difficulty.      Allergies  Ace inhibitors and Calcium carbonate antacid  Home Medications   Prior to Admission medications   Medication Sig Start Date End Date Taking? Authorizing Provider  amLODipine (NORVASC) 10 MG tablet Take 10 mg by mouth daily.    Historical Provider, MD  aspirin ([employer]IL[employer]ENS ASPIRIN) 81 MG chewable tablet Chew 81 mg by mouth daily.      Historical Provider, MD  atorvastatin (LIPITOR) 40  MG tablet TAKE ONE TABLET BY MOUTH ONE TIME DAILY 08/04/15   Uvaldo Rising, MD  BD PEN NEEDLE NANO U/F 32G X 4 MM MISC USE AS INSTRUCTED BY YOUR PHYSICIAN TO CHECK BLOOD SUGAR 03/10/15   Uvaldo Rising, MD  diphenhydrAMINE (BENADRYL) 12.5 MG/5ML elixir Take 25 mg by mouth 4 (four) times daily as needed for allergies.    Historical Provider, MD  doxycycline (VIBRAMYCIN) 50 MG capsule Take 2 capsules (100 mg total) by mouth 2 (two) times daily. 07/14/15   Uvaldo Rising, MD  EPIPEN 2-PAK 0.3 MG/0.3ML SOAJ injection INJECT 0.3MLS INTO THE MUSLE ONCE 08/15/14   Uvaldo Rising, MD  finasteride (PROSCAR) 5 MG tablet Take 1 tablet (5 mg total) by mouth daily. 05/17/15   Uvaldo Rising, MD  gabapentin (NEURONTIN) 600 MG tablet Take 1 tablet (600 mg total) by mouth 3 (three) times  daily. 07/14/15   Uvaldo Rising, MD  hydrochlorothiazide (HYDRODIURIL) 25 MG tablet TAKE ONE-HALF TABLET BY MOUTH DAILY 05/17/15   Uvaldo Rising, MD  insulin lispro (HUMALOG) 100 UNIT/ML KiwkPen Inject 10 units with breakfast, 14 units with lunch, and 14 units with dinner. 04/11/15   Uvaldo Rising, MD  LANTUS SOLOSTAR 100 UNIT/ML Solostar Pen INJECT 50 UNITS INTO THE SKIN DAILY 06/01/15   Uvaldo Rising, MD  loratadine (CLARITIN) 10 MG tablet Take 10 mg by mouth daily.      Historical Provider, MD  metoprolol (LOPRESSOR) 100 MG tablet TAKE ONE TABLET BY MOUTH TWICE DAILY 06/28/15   Tobey Grim, MD  omeprazole (PRILOSEC) 20 MG capsule TAKE ONE CAPSULE BY MOUTH ONE TIME DAILY 07/26/15   Uvaldo Rising, MD  ONE TOUCH ULTRA TEST test strip USE TO CHECK BLOOD SUGAR THREE TIMES DAILY 07/28/15   Uvaldo Rising, MD  potassium chloride (K-DUR,KLOR-CON) 10 MEQ tablet TAKE TWO TABLETS BY MOUTH DAILY 08/04/15   Uvaldo Rising, MD  tamsulosin (FLOMAX) 0.4 MG CAPS capsule TAKE ONE CAPSULE BY MOUTH ONE TIME DAILY 07/17/15   Uvaldo Rising, MD  VICTOZA 18 MG/3ML SOPN INJECT 1.8MG  INTO THE SKIN DAILY 07/31/15   Uvaldo Rising, MD   BP 118/65 mmHg  Pulse 76  Temp(Src) 97.8 F (36.6 C) (Oral)  Resp 16  SpO2 99% Physical Exam  Constitutional: He is oriented to person, place, and time. He appears well-developed. No distress.  HENT:  Head: Normocephalic and atraumatic.  Eyes: Conjunctivae and EOM are normal.  Cardiovascular: Normal rate and regular rhythm.   Pulmonary/Chest: Effort normal. No stridor. No respiratory distress.  Abdominal: He exhibits no distension.  Musculoskeletal: He exhibits no edema.  Neurological: He is alert and oriented to person, place, and time. He displays no atrophy and no tremor. He exhibits normal muscle tone. He displays no seizure activity. Gait abnormal. Coordination normal.  Though the patient describes weakness in his upper and lower extremities, and decreased sensation, he has appropriate  sensation in all 4 extremities, strength is 5/5 in all 4 extremities.   Skin: Skin is warm and dry.  Psychiatric: He has a normal mood and affect.  Nursing note and vitals reviewed.   ED Course  Procedures (including critical care time) Labs Review Labs Reviewed  COMPREHENSIVE METABOLIC PANEL  CBC WITH DIFFERENTIAL/PLATELET  URINALYSIS, ROUTINE W REFLEX MICROSCOPIC (NOT AT [facility])    I have personally reviewed and evaluated the lab results as part of my medical decision-making.  I reviewed the MRI images that the patient  brought with him, and discussed them with neurosurgery (Dr. Jeral Fruit)  Most notable written report is mild effacement of the anterior cord from C4-C5 disc lesion.  On repeat exam the patient is unable to walk without assistance.  Labs notable for hyperglycemia, hypokalemia.    MDM  Patient presents with weakness, gait abnormality. Patient has a much of his evaluation yesterday, but today, additional findings include hyperglycemia, hypokalemia. After discussion with our neurosurgical colleagues, given the patient's ongoing gait difficulty, he was admitted for further evaluation, management.  Gerhard Munch, MD 09/17/15 [phone]

## 2015-09-17 NOTE — ED Notes (Signed)
Attempted to call report

## 2015-09-17 NOTE — H&P (Signed)
Family Medicine Teaching [facility] Admission History and Physical Service Pager: [phone]  Patient name: AAKASH HOLLOMON Medical record number: [id number] Date of birth: July 07, 1940 Age: 75 y.o. Gender: male  Primary Care Provider: Uvaldo Rising, MD Consultants: neurosurgery Code Status: FULL (discussed on admission)  Chief Complaint: weakness/ gait abnormality  Assessment and Plan: MALVIN MORRISH is a 75 y.o. male presenting with weakness/ gait abnormality, hypokalemia . PMH is significant for lumbar radiculopathy affecting LLE, HTN, HLD, DM2 w/ peripheral neuropathy, CKD3, GERD  # Weakness/ Gait abnormality: No focal neurologic deficits on exam.  Cerebellar testing normal.  Patient unsteady upon standing. No cauda equina signs.  No recent URI/ GI illnesses.  No weakness of UE or LE.  No pain associated with perceived weakness.  VSS.  CT head/ cervical spine 08/2105: reads reviewed from outside hospital (WNL, no acute processes).  MRI brain/ cervical spine 08/2015: reads reviewed from outside hospital (No acute processes in brain, MRI cervical spine with mild-moderate degenerative changes.  MRI lumbar 03/2014: The most prominent degenerative disease is at L4-L5. There is effacement of fat around the descending LEFT L5 nerve in the lateral recess dorsal to the L5 vertebra, suggesting an extruded or sequestered disc fragment in this patient with LEFT foot drop. Mild to moderate LEFT foraminal stenosis also potentially implicates the LEFT L4 nerve.  K 2.9. Cr 1.85.    - Admit to med surg, Dr Randolm Idol - c/s to neurosurgery(sees Dr Gerlene Fee outpatient): Dr Jeral Fruit to see today - Neuro checks q4 - B12, Folate, HIV, RPR, TSH ordered - Orthostatic VS - PT/OT/ SLP to assess - consider neuro consult  - consider repeat low back imaging - consider autoimmune work up if aforementioned studies are not insightful, though no objective evidence to suggest PMR, etc. - SCDs in case surgery wants to  operate  # Hypokalemia: K 2.9. On Kdur 20 meq outpatient.  6x KCl IV 10 mEQ ordered in ED - Hold HCTZ - Check magnesium - BMP in am  # DM2 w/ peripheral neuropathy: Last A1c 6.4 (06/2015). On Lantus 50u qd, humalog 02/09/13, and Neurontin 600mg  TID at home.  - SenSSI - CBG monitoring QAC and HS - Reduce Neurontin to 600mg  BID in the setting of CrCl ~38 (Max dose 700mg  BID).   - Will need to send out on adjusted dose  # CKD3: Cr 1.85 (baseline 1.5-1.8).  Appears euvolemic. - Avoid nephrotoxic agents - repeat K as above - modify neurontin dose as above  # HTN: BP 142/76.  On Norvasc 10mg , Lopressor 100mg  and HCTZ 12.5mg  - Hold HCTZ for now in the setting of hypokalemia - Continue Lopressor and Norvasc - Monitor BP  # GERD: on omeprazole outpatient - Protonix 20mg  here  # BPH: on Flomax and Proscar outpatient - Continue  FEN/GI: HH/ Carb mod diet, Protonix Prophylaxis: SCDs until seen by neurosx  Disposition: Med Surg.  Dispo pending Neurosurgery/ PT/OT evaluations  History of Present Illness:  REGIE BUNNER is a 75 y.o. male presenting with weakness/ imbalance  History is provided by patient and his wife who report that patient had abrupt onset of numbness and tingling in his b/l hands yesterday.  He was evaluated at an outside hospital who imaged his brain and cervical spine.  Patient has imaging results and disc with him.  They reports that no acute processes were found and discharged him home.  He reports that this am, he developed balance issues and weakness.  He denies veering to  one side but notes that he cannot walk in a straight line.  He reports chronic numbness/ tingling in his feet but notes that sensation in his upper thighs is different than normal.  Denies saddle anesthesia, urinary retention/ incontinence, fecal incontinence, confusion, difficulty finding words, headache, dizziness.  No recent URI/ GI illness.  Denies diarrhea, nausea, vomiting, cough, SOB, CP.   Report intermittent congestion/ strange sensation w/ swallowing.  Reports compliance with medications.  Sees a neurologist outpatient, who is helping him with balance issues, Dr Gerlene Fee.  Review Of Systems: Per HPI with the following additions: none Otherwise the remainder of the systems were negative.  Patient Active Problem List   Diagnosis Date Noted  . Cellulitis and abscess 07/14/2015  . Preventative health care 11/08/2014  . Angio-edema 07/14/2014  . Angioedema 07/13/2014  . Left foot drop 04/05/2014  . Lumbar back pain with radiculopathy affecting left lower extremity 04/05/2014  . Atypical chest pain 12/14/2013  . Hypokalemia 03/16/2013  . Overweight (BMI 25.0-29.9) 09/15/2012  . Stage 3 chronic renal impairment associated with type 2 diabetes mellitus (HCC) 07/20/2010  . ULNAR NERVE ENTRAPMENT, RIGHT 06/07/2008  . Diabetes mellitus (HCC) 12/09/2007  . Essential hypertension, benign 12/08/2007  . DIASTASIS RECTI 12/08/2007  . HYPERLIPIDEMIA 06/26/2006  . ANEMIA, OTHER, UNSPECIFIED 06/26/2006  . Peripheral autonomic neuropathy due to diabetes mellitus (HCC) 06/26/2006  . GASTROESOPHAGEAL REFLUX DISEASE, CHRONIC 06/26/2006  . Enlarged prostate with lower urinary tract symptoms (LUTS) 06/26/2006    Past Medical History: Past Medical History  Diagnosis Date  . Elbow dislocation 1610,9604    Right  . Treadmill stress test negative for angina pectoris 06/2002    poor HR and BP recovery  . Abdominal ultrasound, abnormal 02/2004    fatty liver  . Encounter for diagnostic endoscopy 10/22/04    negative  . History of esophagogastroduodenoscopy 08/13/2007    normal  . Diabetes mellitus without complication [facility])     Past Surgical History: Past Surgical History  Procedure Laterality Date  . Inguinal hernia repair  1947  . Anterior cruciate ligament repair  5/98    Left, staph infection complicated  . Blepharoplasty  03/2005    with ptosis repair  . Basal cell carcinoma  excision  02/2005    R ear    Social History: Social History  Substance Use Topics  . Smoking status: Former Smoker -- 1.00 packs/day for 10 years    Types: Cigarettes    Start date: July 24, 1940    Quit date: 06/10/1967  . Smokeless tobacco: Never Used  . Alcohol Use: 1.0 oz/week    2 Standard drinks or equivalent per week     Comment: occasional   Additional social history: former smoker, occ ETOH (only a couple of beers socially), no drugs  Please also refer to relevant sections of EMR.  Family History: Family History  Problem Relation Age of Onset  . Alzheimer's disease Mother     died age 30  . Aortic aneurysm Father     died age 58  . Anuerysm Father   . Aortic aneurysm Brother     repaired plus AI graft  . Diabetes Brother    Allergies and Medications: Allergies  Allergen Reactions  . Ace Inhibitors Other (See Comments)    REACTION: Angioedema  . Calcium Carbonate Antacid Other (See Comments)    REACTION: Angioedema of mouth   No current facility-administered medications on file prior to encounter.   Current Outpatient Prescriptions on File Prior to Encounter  Medication Sig Dispense Refill  . amLODipine (NORVASC) 10 MG tablet Take 10 mg by mouth daily.    Marland Kitchen. aspirin (BAYER CHILDRENS ASPIRIN) 81 MG chewable tablet Chew 81 mg by mouth daily.      Marland Kitchen. atorvastatin (LIPITOR) 40 MG tablet TAKE ONE TABLET BY MOUTH ONE TIME DAILY 90 tablet 1  . diphenhydrAMINE (BENADRYL) 12.5 MG/5ML elixir Take 25 mg by mouth 4 (four) times daily as needed for allergies.    . finasteride (PROSCAR) 5 MG tablet Take 1 tablet (5 mg total) by mouth daily. 90 tablet 1  . gabapentin (NEURONTIN) 600 MG tablet Take 1 tablet (600 mg total) by mouth 3 (three) times daily. 270 tablet 1  . hydrochlorothiazide (HYDRODIURIL) 25 MG tablet TAKE ONE-HALF TABLET BY MOUTH DAILY 45 tablet 1  . insulin lispro (HUMALOG) 100 UNIT/ML KiwkPen Inject 10 units with breakfast, 14 units with lunch, and 14 units with  dinner. 15 mL 11  . LANTUS SOLOSTAR 100 UNIT/ML Solostar Pen INJECT 50 UNITS INTO THE SKIN DAILY 5 pen 3  . metoprolol (LOPRESSOR) 100 MG tablet TAKE ONE TABLET BY MOUTH TWICE DAILY 180 tablet 0  . omeprazole (PRILOSEC) 20 MG capsule TAKE ONE CAPSULE BY MOUTH ONE TIME DAILY 90 capsule 1  . ONE TOUCH ULTRA TEST test strip USE TO CHECK BLOOD SUGAR THREE TIMES DAILY 100 each 2  . potassium chloride (K-DUR,KLOR-CON) 10 MEQ tablet TAKE TWO TABLETS BY MOUTH DAILY 180 tablet 1  . tamsulosin (FLOMAX) 0.4 MG CAPS capsule TAKE ONE CAPSULE BY MOUTH ONE TIME DAILY 90 capsule 1  . VICTOZA 18 MG/3ML SOPN INJECT 1.8MG  INTO THE SKIN DAILY 5 pen 5  . BD PEN NEEDLE NANO U/F 32G X 4 MM MISC USE AS INSTRUCTED BY YOUR PHYSICIAN TO CHECK BLOOD SUGAR (Patient not taking: Reported on 09/17/2015) 100 each 1  . doxycycline (VIBRAMYCIN) 50 MG capsule Take 2 capsules (100 mg total) by mouth 2 (two) times daily. (Patient not taking: Reported on 09/17/2015) 20 capsule 0  . EPIPEN 2-PAK 0.3 MG/0.3ML SOAJ injection INJECT 0.3MLS INTO THE MUSLE ONCE 2 Device 1    Objective: BP 142/76 mmHg  Pulse 72  Temp(Src) 97.8 F (36.6 C) (Oral)  Resp 22  SpO2 99% Exam: General: awake, alert, well appearing male, NAD, wife at bedside Eyes: EOMI, PERRL, wears glasses ENTM: o/p clear, MMM Neck: supple, no thyromegaly or masses Cardiovascular: RRR, no murmurs Respiratory: CTAB, normal WOB on room air Abdomen: protuberant, soft, NT/ND MSK: 5/5 UE and LE strength, normal tone, no edema; no paraspinal TTP, mild C7 midline TTP but no palpable abnormalities, negative spurling's, AROM reduced in left rotation of c-spine by about 15 degrees.  Otherwise has full painless AROM. Skin: no rashes Neuro: CN 2-12 grossly in tact, light touch sensation in tact, patellar DTRs diminished bilaterally (1/4), 5/5 LE and UE strength. LE and UE cerebellar testing normal. AOx4. No resting tremor or intention tremor. Psych: mood stable, speech normal  Labs  and Imaging: CBC BMET   Recent Labs Lab 09/17/15 1158  WBC 5.4  HGB 11.1*  HCT 32.7*  PLT 156    Recent Labs Lab 09/17/15 1158  NA 137  K 2.9*  CL 100*  CO2 27  BUN 27*  CREATININE 1.85*  GLUCOSE 201*  CALCIUM 9.0     No results found.  Raliegh IpAshly M Lucy Woolever, DO 09/17/2015, 2:05 PM PGY-2, [facility] FPTS Intern pager: [phone], text pages welcome

## 2015-09-17 NOTE — Progress Notes (Signed)
Patient trasfered from ED to [phone] via stretcher; alert and oriented x 4; no complaints of pain; IV in RFA receiving K and NS@100cc /hr; Orient patient to room and unit; gave patient care guide; instructed how to use the call bell and  fall risk precautions. Will continue to monitor the patient.

## 2015-09-17 NOTE — ED Notes (Signed)
Pt c/o pain at infusion site of potassium. Iv decreased to 50 cc/h and ns at 100cc/h.

## 2015-09-18 ENCOUNTER — Inpatient Hospital Stay (HOSPITAL_COMMUNITY): Payer: Medicare Other

## 2015-09-18 DIAGNOSIS — I10 Essential (primary) hypertension: Secondary | ICD-10-CM

## 2015-09-18 DIAGNOSIS — R531 Weakness: Secondary | ICD-10-CM

## 2015-09-18 DIAGNOSIS — G610 Guillain-Barre syndrome: Principal | ICD-10-CM | POA: Insufficient documentation

## 2015-09-18 DIAGNOSIS — Z794 Long term (current) use of insulin: Secondary | ICD-10-CM

## 2015-09-18 DIAGNOSIS — R269 Unspecified abnormalities of gait and mobility: Secondary | ICD-10-CM

## 2015-09-18 DIAGNOSIS — N183 Chronic kidney disease, stage 3 (moderate): Secondary | ICD-10-CM

## 2015-09-18 DIAGNOSIS — E1122 Type 2 diabetes mellitus with diabetic chronic kidney disease: Secondary | ICD-10-CM

## 2015-09-18 DIAGNOSIS — E1143 Type 2 diabetes mellitus with diabetic autonomic (poly)neuropathy: Secondary | ICD-10-CM

## 2015-09-18 DIAGNOSIS — K219 Gastro-esophageal reflux disease without esophagitis: Secondary | ICD-10-CM

## 2015-09-18 DIAGNOSIS — E0821 Diabetes mellitus due to underlying condition with diabetic nephropathy: Secondary | ICD-10-CM

## 2015-09-18 LAB — CBC
HCT: 33.5 % — ABNORMAL LOW (ref 39.0–52.0)
Hemoglobin: 11.4 g/dL — ABNORMAL LOW (ref 13.0–17.0)
MCH: 30.2 pg (ref 26.0–34.0)
MCHC: 34 g/dL (ref 30.0–36.0)
MCV: 88.9 fL (ref 78.0–100.0)
Platelets: 146 10*3/uL — ABNORMAL LOW (ref 150–400)
RBC: 3.77 MIL/uL — ABNORMAL LOW (ref 4.22–5.81)
RDW: 14.5 % (ref 11.5–15.5)
WBC: 5.6 10*3/uL (ref 4.0–10.5)

## 2015-09-18 LAB — PROTIME-INR
INR: 1.16 (ref 0.00–1.49)
Prothrombin Time: 15 seconds (ref 11.6–15.2)

## 2015-09-18 LAB — RPR: RPR Ser Ql: NONREACTIVE

## 2015-09-18 LAB — GLUCOSE, CAPILLARY
Glucose-Capillary: 229 mg/dL — ABNORMAL HIGH (ref 65–99)
Glucose-Capillary: 363 mg/dL — ABNORMAL HIGH (ref 65–99)
Glucose-Capillary: 280 mg/dL — ABNORMAL HIGH (ref 65–99)
Glucose-Capillary: 256 mg/dL — ABNORMAL HIGH (ref 65–99)

## 2015-09-18 LAB — BASIC METABOLIC PANEL
Anion gap: 9 (ref 5–15)
BUN: 17 mg/dL (ref 6–20)
CO2: 28 mmol/L (ref 22–32)
Calcium: 8.7 mg/dL — ABNORMAL LOW (ref 8.9–10.3)
Chloride: 104 mmol/L (ref 101–111)
Creatinine, Ser: 1.68 mg/dL — ABNORMAL HIGH (ref 0.61–1.24)
GFR calc Af Amer: 45 mL/min — ABNORMAL LOW (ref >60)
GFR calc non Af Amer: 39 mL/min — ABNORMAL LOW (ref >60)
Glucose, Bld: 191 mg/dL — ABNORMAL HIGH (ref 65–99)
Potassium: 3.4 mmol/L — ABNORMAL LOW (ref 3.5–5.1)
Sodium: 141 mmol/L (ref 135–145)

## 2015-09-18 LAB — CSF CELL COUNT WITH DIFFERENTIAL
RBC Count, CSF: 1 /mm3 — ABNORMAL HIGH
Tube #: 1
WBC, CSF: 1 /mm3 (ref 0–5)

## 2015-09-18 LAB — PROTEIN AND GLUCOSE, CSF
Glucose, CSF: 148 mg/dL — ABNORMAL HIGH (ref 40–70)
Total  Protein, CSF: 135 mg/dL — ABNORMAL HIGH (ref 15–45)

## 2015-09-18 LAB — SEDIMENTATION RATE: Sed Rate: 25 mm/hr — ABNORMAL HIGH (ref 0–16)

## 2015-09-18 LAB — HIV ANTIBODY (ROUTINE TESTING W REFLEX): HIV Screen 4th Generation wRfx: NONREACTIVE

## 2015-09-18 LAB — C-REACTIVE PROTEIN: CRP: 0.6 mg/dL (ref <1.0)

## 2015-09-18 MED ORDER — SODIUM CHLORIDE 0.9 % IV SOLN
4.0000 g | Freq: Once | INTRAVENOUS | Status: DC
  Filled 2015-09-18 (×2): qty 40

## 2015-09-18 MED ORDER — SODIUM CHLORIDE 0.9 % IV SOLN
Freq: Once | INTRAVENOUS | Status: DC
  Filled 2015-09-18: qty 200

## 2015-09-18 MED ORDER — ACETAMINOPHEN 325 MG PO TABS
650.0000 mg | ORAL_TABLET | ORAL | Status: DC | PRN
Start: 2015-09-18 — End: 2015-09-21
  Administered 2015-09-18 – 2015-09-19 (×2): 650 mg via ORAL

## 2015-09-18 MED ORDER — DIPHENHYDRAMINE HCL 25 MG PO CAPS: 25.0000 mg | ORAL_CAPSULE | Freq: Four times a day (QID) | ORAL | Status: DC | PRN

## 2015-09-18 MED ORDER — POTASSIUM CHLORIDE CRYS ER 10 MEQ PO TBCR
10.0000 meq | EXTENDED_RELEASE_TABLET | Freq: Every day | ORAL | Status: DC
  Administered 2015-09-18 – 2015-09-20 (×3): 10 meq via ORAL
  Filled 2015-09-18 (×3): qty 1

## 2015-09-18 MED ORDER — INSULIN GLARGINE 100 UNIT/ML ~~LOC~~ SOLN: 20.0000 [IU] | Freq: Every day | SUBCUTANEOUS | Status: DC

## 2015-09-18 MED ORDER — INSULIN GLARGINE 100 UNIT/ML ~~LOC~~ SOLN
25.0000 [IU] | Freq: Every day | SUBCUTANEOUS | Status: DC
  Administered 2015-09-18 – 2015-09-19 (×2): 25 [IU] via SUBCUTANEOUS
  Filled 2015-09-18 (×3): qty 0.25

## 2015-09-18 MED ORDER — SODIUM CHLORIDE 0.9 % IV SOLN
INTRAVENOUS | Status: AC
  Filled 2015-09-18 (×3): qty 200

## 2015-09-18 MED ORDER — ACD FORMULA A 0.73-2.45-2.2 GM/100ML VI SOLN
500.0000 mL | Status: DC
  Filled 2015-09-18: qty 500

## 2015-09-18 MED ORDER — HEPARIN SODIUM (PORCINE) 1000 UNIT/ML IJ SOLN
1000.0000 [IU] | Freq: Once | INTRAMUSCULAR | Status: DC
  Filled 2015-09-18: qty 1

## 2015-09-18 NOTE — Progress Notes (Signed)
OT Cancellation Note  Patient Details Name: Tanner Mason MRN: [id number] DOB: [birth year]   Cancelled Treatment:    Reason Eval/Treat Not Completed: Patient at procedure or test/ unavailable (having lumbar puncture). Will assess tomorrow.  [facility],HILLARY  Hydeia Mcatee, OTR/L  [phone] 09/18/2015 09/18/2015, 2:02 PM

## 2015-09-18 NOTE — Progress Notes (Signed)
At this time VC = 2.0L and NIF =-45cmH2O with good effort

## 2015-09-18 NOTE — Progress Notes (Signed)
Inpatient Diabetes Program Recommendations  AACE/ADA: New Consensus Statement on Inpatient Glycemic Control (2015)  Target Ranges:  Prepandial:   less than 140 mg/dL      Peak postprandial:   less than 180 mg/dL (1-2 hours)      Critically ill patients:  140 - 180 mg/dL   Results for Dorthula RueKAMMEYER, Jaizon A (MRN [id number]) as of 09/18/2015 12:06  Ref. Range 09/17/2015 16:54 09/17/2015 22:18 09/18/2015 07:58 09/18/2015 11:56  Glucose-Capillary Latest Ref Range: 65-99 mg/dL 119135 (H) 147224 (H) 829229 (H) 363 (H)   Review of Glycemic Control  Diabetes history: DM 2 Outpatient Diabetes medications: Lantus 50 units, Victoza 1.8 Daily, Humalog 10 units breakfast, 14 units lunch, 14 units supper Current orders for Inpatient glycemic control: Lantus 25 units, Novolog Sensitive + HS scale  Inpatient Diabetes Program Recommendations: Insulin - Basal: Fasting glucose above goal >200 mg/dl. Please increase basal insulin to 30-32 units. Insulin - Meal Coverage: Patient takes 10-14 units of meal coverage at home. Please consider starting meal coverage at Novolog 5 units TID.  Thanks,  Christena DeemShannon Casimira Sutphin RN, MSN, [facility] Inpatient Diabetes Coordinator Team Pager [phone] (8a-5p)

## 2015-09-18 NOTE — Procedures (Signed)
Lumbar Puncture Procedure Note  Pre-operative Diagnosis: r/o GB  Post-operative Diagnosis: r/o GB  Indications: Diagnostic  Procedure Details   Consent: Informed consent was obtained. Risks of the procedure were discussed including: infection, bleeding, pain and headache.  The patient was positioned under sterile conditions. Betadine solution and sterile drapes were utilized. A spinal needle was inserted at the L3 - L4 interspace.  Spinal fluid was obtained and sent to the laboratory.  Findings 7mL of clear spinal fluid was obtained. Opening Pressure: unablecm H2O pressure. Closing Pressure: wnl cm H2O pressure.  Complications:  None; patient tolerated the procedure well.        Condition: stable  Plan Bed rest for 5 hours. Tylenol 650 mg for pain.  Tanner Rossettianiel J. Tyson AliasFeinstein, MD, FACP Pgr: [phone] [facility]

## 2015-09-18 NOTE — Evaluation (Signed)
Clinical/Bedside Swallow Evaluation Patient Details  Name: Tanner Mason MRN: [id number] Date of Birth: Dec 11, 1940  Today's Date: 09/18/2015 Time: SLP Start Time (ACUTE ONLY): 1120 SLP Stop Time (ACUTE ONLY): 1141 SLP Time Calculation (min) (ACUTE ONLY): 21 min  Past Medical History:  Past Medical History  Diagnosis Date  . Elbow dislocation [id number]    Right  . Treadmill stress test negative for angina pectoris 06/2002    poor HR and BP recovery  . Abdominal ultrasound, abnormal 02/2004    fatty liver  . Encounter for diagnostic endoscopy 10/22/04    negative  . History of esophagogastroduodenoscopy 08/13/2007    normal  . Diabetes mellitus without complication [facility](HCC)    Past Surgical History:  Past Surgical History  Procedure Laterality Date  . Inguinal hernia repair  1947  . Anterior cruciate ligament repair  5/98    Left, staph infection complicated  . Blepharoplasty  03/2005    with ptosis repair  . Basal cell carcinoma excision  02/2005    R ear   HPI:  Tanner RueCharles A Meller is a 75 y.o. male presenting with weakness/ gait abnormality, hypokalemia . PMH is significant for lumbar radiculopathy affecting LLE, HTN, HLD, DM2 w/ peripheral neuropathy, CKD3, GERD.  No focal neurologic deficits on exam. Cerebellar testing normal. Patient unsteady upon standing. No cauda equina signs. No recent URI/ GI illnesses. No weakness of UE or LE. No pain associated with perceived weakness. VSS. CT head/ cervical spine 08/2015: reads reviewed from outside hospital (WNL, no acute processes). MRI brain/ cervical spine 08/2015: reads reviewed from outside hospital (No acute processes in brain, MRI cervical spine with mild-moderate degenerative changes.     Assessment / Plan / Recommendation Clinical Impression  Clinical swallowing evaluation was completed.  Oral mechanism exam was unremarkable.  The patient's oral and pharyngeal swallow appeared to be functional.  Swallow trigger was  timely and hyo-laryngeal excursion was judged to be adequate.  Mastication of dry solids was functional.  No overt s/s of aspiration were observed.  The patient does complain of material sticking mid chest as well as a hoarse vocal quality.  These symptoms are most consistent with esophageal dysphagia.  Suggest follow up with ENT if hoarse vocal quality persists to evaluate the vocal cords.  Consider GI consult if complaints of material sticking persist.   The patient was educated to alternate food with liquids and to try using a hot beveral with meals to see if it facilitates esophageal clearance.  Recommend continue with a regular diet and thin liquids with use of strategies to facilitate esopahgeal clearance.      Aspiration Risk  Mild aspiration risk    Diet Recommendation   Regular diet with thin liquids.    Medication Administration: Whole meds with liquid    Other  Recommendations Recommended Consults: Consider GI evaluation;Consider ENT evaluation Oral Care Recommendations: Oral care BID   Follow up Recommendations  None      Swallow Study   General Date of Onset: 09/17/15 HPI: Tanner RueCharles A Bowlds is a 75 y.o. male presenting with weakness/ gait abnormality, hypokalemia . PMH is significant for lumbar radiculopathy affecting LLE, HTN, HLD, DM2 w/ peripheral neuropathy, CKD3, GERD.  No focal neurologic deficits on exam. Cerebellar testing normal. Patient unsteady upon standing. No cauda equina signs. No recent URI/ GI illnesses. No weakness of UE or LE. No pain associated with perceived weakness. VSS. CT head/ cervical spine 08/2015: reads reviewed from outside hospital (WNL, no acute processes). MRI  brain/ cervical spine 08/2015: reads reviewed from outside hospital (No acute processes in brain, MRI cervical spine with mild-moderate degenerative changes.   Type of Study: Bedside Swallow Evaluation Previous Swallow Assessment: None noted.   Diet Prior to this Study: Regular;Thin  liquids Temperature Spikes Noted: No Respiratory Status: Room air History of Recent Intubation: No Behavior/Cognition: Alert;Cooperative;Pleasant mood Oral Cavity Assessment: Within Functional Limits Oral Care Completed by SLP: No Oral Cavity - Dentition: Adequate natural dentition Vision: Functional for self-feeding Self-Feeding Abilities: Able to feed self Patient Positioning: Upright in bed Baseline Vocal Quality: Normal Volitional Cough: Strong Volitional Swallow: Able to elicit    Oral/Motor/Sensory Function Overall Oral Motor/Sensory Function: Within functional limits   Ice Chips Ice chips: Not tested   Thin Liquid Thin Liquid: Within functional limits Presentation: Cup;Self Fed;Straw    Nectar Thick Nectar Thick Liquid: Not tested   Honey Thick Honey Thick Liquid: Not tested   Puree Puree: Within functional limits Presentation: Spoon;Self Fed   Solid   GO   Solid: Within functional limits Presentation: Self Fed    Functional Assessment Tool Used: ASHA NOMS and clinical judgment.   Functional Limitations: Swallowing Swallow Current Status [id number]): At least 1 percent but less than 20 percent impaired, limited or restricted Swallow Goal Status [phone]): At least 1 percent but less than 20 percent impaired, limited or restricted Swallow Discharge Status [phone]): At least 1 percent but less than 20 percent impaired, limited or restricted  Dimas Aguas, MA, CCC-SLP Acute Rehab SLP [phone] Dimas Aguas N 09/18/2015,11:50 AM

## 2015-09-18 NOTE — Consult Note (Signed)
NEURO HOSPITALIST CONSULT NOTE   Requestig physician: Dr. Lajuana Ripple   Reason for Consult: bilateral leg weakness   History obtained from:  Patient   HPI:                                                                                                                                          Tanner Mason is an 75 y.o. male patient had abrupt onset of numbness and tingling in his b/l hands on Saturday in addition to numbness in his feet bilaterally. He has known diabetic neuropathy but this was different. Marland Kitchen He was evaluated at an outside hospital who imaged his brain and cervical spine. Reports that no acute processes were found and discharged him home. HE has also had a MRI of brain and cervical spine (which I cannot find on the floor) and he was told they were normal.  Due to worsening balance issues and weakness he came to ED at [facility]. Three days after the initial start date he now cannot stand on his own due to significant weakness of his legs. He also has worsening numbness in his legs. HE has no reflexes in his Knees and states he usually does.   Past Medical History  Diagnosis Date  . Elbow dislocation 1791,5056    Right  . Treadmill stress test negative for angina pectoris 06/2002    poor HR and BP recovery  . Abdominal ultrasound, abnormal 02/2004    fatty liver  . Encounter for diagnostic endoscopy 10/22/04    negative  . History of esophagogastroduodenoscopy 08/13/2007    normal  . Diabetes mellitus without complication [facility])     Past Surgical History  Procedure Laterality Date  . Inguinal hernia repair  1947  . Anterior cruciate ligament repair  5/98    Left, staph infection complicated  . Blepharoplasty  03/2005    with ptosis repair  . Basal cell carcinoma excision  02/2005    R ear    Family History  Problem Relation Age of Onset  . Alzheimer's disease Mother     died age 65  . Aortic aneurysm Father     died age 49  . Anuerysm  Father   . Aortic aneurysm Brother     repaired plus AI graft  . Diabetes Brother      Social History:  reports that he quit smoking about 48 years ago. His smoking use included Cigarettes. He started smoking about 73 years ago. He has a 10 pack-year smoking history. He has never used smokeless tobacco. He reports that he drinks about 1.0 oz of alcohol per week. He reports that he does not use illicit drugs.  Allergies  Allergen Reactions  . Ace Inhibitors Other (See Comments)    REACTION: Angioedema  .  Calcium Carbonate Antacid Other (See Comments)    REACTION: Angioedema of mouth    MEDICATIONS:                                                                                                                     Scheduled: . amLODipine  10 mg Oral Daily  . aspirin  81 mg Oral QHS  . atorvastatin  40 mg Oral q1800  . finasteride  5 mg Oral QHS  . gabapentin  600 mg Oral BID  . insulin aspart  0-5 Units Subcutaneous QHS  . insulin aspart  0-9 Units Subcutaneous TID WC  . insulin glargine  25 Units Subcutaneous Daily  . metoprolol  100 mg Oral BID  . pantoprazole  20 mg Oral Daily  . potassium chloride  10 mEq Oral Daily  . tamsulosin  0.4 mg Oral Daily   Continuous:    ROS:                                                                                                                                       History obtained from the patient  General ROS: negative for - chills, fatigue, fever, night sweats, weight gain or weight loss Psychological ROS: negative for - behavioral disorder, hallucinations, memory difficulties, mood swings or suicidal ideation Ophthalmic ROS: negative for - blurry vision, double vision, eye pain or loss of vision ENT ROS: negative for - epistaxis, nasal discharge, oral lesions, sore throat, tinnitus or vertigo Allergy and Immunology ROS: negative for - hives or itchy/watery eyes Hematological and Lymphatic ROS: negative for - bleeding problems,  bruising or swollen lymph nodes Endocrine ROS: negative for - galactorrhea, hair pattern changes, polydipsia/polyuria or temperature intolerance Respiratory ROS: negative for - cough, hemoptysis, shortness of breath or wheezing Cardiovascular ROS: negative for - chest pain, dyspnea on exertion, edema or irregular heartbeat Gastrointestinal ROS: negative for - abdominal pain, diarrhea, hematemesis, nausea/vomiting or stool incontinence Genito-Urinary ROS: negative for - dysuria, hematuria, incontinence or urinary frequency/urgency Musculoskeletal ROS: negative for - joint swelling or muscular weakness Neurological ROS: as noted in HPI Dermatological ROS: negative for rash and skin lesion changes   Blood pressure 156/85, pulse 91, temperature 98.2 F (36.8 C), temperature source Oral, resp. rate 20, height _0  (1.702 m), weight 74.844 kg (165 lb), SpO2 97 %.   Neurologic Examination:  HEENT-  Normocephalic, no lesions, without obvious abnormality.  Normal external eye and conjunctiva.  Normal TM's bilaterally.  Normal auditory canals and external ears. Normal external nose, mucus membranes and septum.  Normal pharynx. Cardiovascular- S1, S2 normal, pulses palpable throughout   Lungs- chest clear, no wheezing, rales, normal symmetric air entry Abdomen- normal findings: bowel sounds normal Extremities- no edema Lymph-no adenopathy palpable Musculoskeletal-no joint tenderness, deformity or swelling Skin-warm and dry, no hyperpigmentation, vitiligo, or suspicious lesions  Neurological Examination Mental Status: Alert, oriented, thought content appropriate.  Speech fluent without evidence of aphasia.  Able to follow 3 step commands without difficulty. Cranial Nerves: II: Discs flat bilaterally; Visual fields grossly normal, pupils equal, round, reactive to light and accommodation III,IV, VI:  ptosis not present, extra-ocular motions intact bilaterally V,VII: smile symmetric, facial light touch sensation normal bilaterally VIII: hearing normal bilaterally IX,X: uvula rises symmetrically XI: bilateral shoulder shrug XII: midline tongue extension Motor: Right : Upper extremity   5/5    Left:     Upper extremity   5/5  Lower extremity   5/5     Lower extremity   5/5 --4/5 bilateral DF and PF Tone and bulk:normal tone throughout; no atrophy noted Sensory: Pinprick and light touch decreased up to mid shin, no vibratory sensation up to knee, inconsistent proprioception of great toe, intact temperature Deep Tendon Reflexes: 0/0 and symmetric throughout LE with 1/4 in UE Plantars: Dow going bilaterally Cerebellar: normal finger-to-nose,  and normal heel-to-shin test Gait: upon attempting to stand he had a very wide based gait and needed assistance to stand. Felt as if he could not sense where his feet and knees were.      Lab Results: Basic Metabolic Panel:  Recent Labs Lab 09/17/15 1158 09/17/15 1603 09/18/15 0531  NA 137  --  141  K 2.9*  --  3.4*  CL 100*  --  104  CO2 27  --  28  GLUCOSE 201*  --  191*  BUN 27*  --  17  CREATININE 1.85*  --  1.68*  CALCIUM 9.0  --  8.7*  MG  --  1.7  --     Liver Function Tests:  Recent Labs Lab 09/17/15 1158  AST 21  ALT 29  ALKPHOS 75  BILITOT 1.0  PROT 7.1  ALBUMIN 3.6   No results for input(s): LIPASE, AMYLASE in the last 168 hours. No results for input(s): AMMONIA in the last 168 hours.  CBC:  Recent Labs Lab 09/17/15 1158 09/18/15 0531  WBC 5.4 5.6  NEUTROABS 3.4  --   HGB 11.1* 11.4*  HCT 32.7* 33.5*  MCV 87.2 88.9  PLT 156 146*    Cardiac Enzymes: No results for input(s): CKTOTAL, CKMB, CKMBINDEX, TROPONINI in the last 168 hours.  Lipid Panel: No results for input(s): CHOL, TRIG, HDL, CHOLHDL, VLDL, LDLCALC in the last 168 hours.  CBG:  Recent Labs Lab 09/17/15 1654 09/17/15 2218  09/18/15 0758 09/18/15 1156  GLUCAP 135* 224* 229* 363*    Microbiology: Results for orders placed or performed in visit on 07/14/15  Wound culture     Status: None   Collection Time: 07/14/15 11:33 AM  Result Value Ref Range Status   Gram Stain Abundant  Final   Gram Stain WBC present-both PMN and Mononuclear  Final   Gram Stain No Squamous Epithelial Cells Seen  Final   Gram Stain No Organisms Seen  Final   Organism ID, Bacteria NO GROWTH 2 DAYS  Final    Coagulation Studies:  Recent Labs  09/18/15 0531  LABPROT 15.0  INR 1.16    Imaging: No results found.     Assessment and plan per attending neurologist  Etta Quill PA-C [facility] [phone]  09/18/2015, 12:12 PM   Assessment/Plan: 75 yo M with progressive lower extremity weakness/numbness and areflexia consistent with guillain-barre syndrome. At this time, I would favor LP, but barring marked pleocytosis, I would favor going ahead and treating with plasma exchange.   1) LP for protein, glucose, cells with diff 2) ESR, CRP 3) barring marked pleocytosis, will start PLEX, daily x 3 days followed by QOD  4) daily INR on PLEX 5) will follow  Roland Rack, MD [facility] [phone]  If 7pm- 7am, please page neurology on call as listed in [place].

## 2015-09-18 NOTE — Evaluation (Signed)
Physical Therapy Evaluation Patient Details Name: Dorthula RueCharles A Seiter MRN: [id number] DOB: 24-Aug-1940 Today's Date: 09/18/2015   History of Present Illness  Dorthula RueCharles A Augspurger is an 75 y.o. male patient had abrupt onset of numbness and tingling in his b/l hands on Saturday in addition to numbness in his feet bilaterally. He has known diabetic neuropathy but this was different. Marland Kitchen. He was evaluated at an outside hospital who imaged his brain and cervical spine. Reports that no acute processes were found and discharged him home. HE has also had a MRI of brain and cervical spine (which I cannot find on the floor) and he was told they were normal. Due to worsening balance issues and weakness [facility]. Three days after the initial start date he now cannot stand on his own due to significant weakness of his legs. He also has worsening numbness in his legs. HE has no reflexes in his Knees and states he usually does.   Clinical Impression  Pt admitted with above diagnosis. Pt currently with functional limitations due to the deficits listed below (see PT Problem List).  Pt will benefit from skilled PT to increase their independence and safety with mobility to allow discharge to the venue listed below.  Pt presenting with decreased strength, proprioception, sensation, coordination and muscular endurance.  Pt about to undergo lumbar puncture after PT eval to assess for possibility of Guillan-Barre.  Pt has supportive wife and he is motivated to work with PT to "walk again".  He was able to stand with MAX A and perform side steps in front of bed and lateral and staggered weight shifting with RW and MOD A, but with was locking his knees.  Recommend 2nd person for gait attempts.  Feel this pt is an excellent candidate for CIR as he was independent in his PLOF and is motivated to return home to his wife and to their 1 level home with 4 step entry.     Follow Up Recommendations CIR    Equipment  Recommendations  Other (comment) (Continue to assess)    Recommendations for Other Services Rehab consult     Precautions / Restrictions Restrictions Weight Bearing Restrictions: No      Mobility  Bed Mobility Overal bed mobility: Needs Assistance Bed Mobility: Supine to Sit     Supine to sit: Supervision;HOB elevated     General bed mobility comments: Pt came to EOB with rail and HOB elevated  Transfers Overall transfer level: Needs assistance Equipment used: Rolling walker (2 wheeled) Transfers: Sit to/from Stand Sit to Stand: Max assist;From elevated surface         General transfer comment: Stood to [employer] with MAX A and difficulty extending.  Once in standing, pt locks knees out to maintain position.    Ambulation/Gait Ambulation/Gait assistance: Mod assist   Assistive device: Rolling walker (2 wheeled) Gait Pattern/deviations: Ataxic     General Gait Details: Side steps in front of bed only with difficulty with foot placement causing either too wide or too narrow BOS. MOD A for positioning and to prevent buckeling.  Stairs            Wheelchair Mobility    Modified Rankin (Stroke Patients Only)       Balance Overall balance assessment: Needs assistance Sitting-balance support: Feet supported Sitting balance-Leahy Scale: Good       Standing balance-Leahy Scale: Zero Standing balance comment: requires RW, A and locking of knees  High Level Balance Comments: In standing at RW, facilitated lateral weight shifts and staggered stance weight shifts.  Most difficulty with L LE forward.             Pertinent Vitals/Pain Pain Assessment: No/denies pain    Home Living Family/patient expects to be discharged to:: Private residence Living Arrangements: Spouse/significant other Available Help at Discharge: Family Type of Home: House Home Access: Stairs to enter Entrance Stairs-Rails: None Entrance Stairs-Number of Steps: 4    Home Equipment: Crutches;Other (comment);Grab bars - tub/shower (one crutch)      Prior Function Level of Independence: Independent               Hand Dominance   Dominant Hand: Right    Extremity/Trunk Assessment   Upper Extremity Assessment: Defer to OT evaluation           Lower Extremity Assessment: RLE deficits/detail;LLE deficits/detail RLE Deficits / Details: Hip flex 3+/5, quads 4/5, df 3/5 LLE Deficits / Details: Hip flex 3+/5, quads 4/5 df 3-/5  Cervical / Trunk Assessment: Normal  Communication   Communication: No difficulties  Cognition Arousal/Alertness: Awake/alert Behavior During Therapy: WFL for tasks assessed/performed Overall Cognitive Status: Within Functional Limits for tasks assessed                      General Comments      Exercises        Assessment/Plan    PT Assessment Patient needs continued PT services  PT Diagnosis Generalized weakness;Abnormality of gait   PT Problem List Decreased strength;Decreased activity tolerance;Decreased balance;Decreased mobility;Decreased knowledge of use of DME;Impaired sensation  PT Treatment Interventions DME instruction;Gait training;Functional mobility training;Therapeutic activities;Therapeutic exercise;Balance training   PT Goals (Current goals can be found in the Care Plan section) Acute Rehab PT Goals Patient Stated Goal: to get back to walking PT Goal Formulation: With patient/family Time For Goal Achievement: 10/02/15 Potential to Achieve Goals: Good    Frequency Min 4X/week   Barriers to discharge Inaccessible home environment      Co-evaluation               End of Session Equipment Utilized During Treatment: Gait belt Activity Tolerance: Patient tolerated treatment well Patient left: in bed;Other (comment);with family/visitor present (sitting EOB with neurologist and PA present to speak with him)           Time: 1610-9604 PT Time Calculation (min) (ACUTE  ONLY): 25 min   Charges:   PT Evaluation $PT Eval High Complexity: 1 Procedure PT Treatments $Therapeutic Activity: 8-22 mins   PT G Codes:        Jeanet Lupe LUBECK 09/18/2015, 1:45 PM

## 2015-09-18 NOTE — Procedures (Signed)
Central Venous Catheter Insertion Procedure Note - HD for plasma Maryelizabeth KaufmannX Mills A Pettigrew [id number] 1941-04-20  Procedure: Insertion of Central Venous Catheter Indications: plasmax  Procedure Details Consent: Risks of procedure as well as the alternatives and risks of each were explained to the (patient/caregiver).  Consent for procedure obtained. Time Out: Verified patient identification, verified procedure, site/side was marked, verified correct patient position, special equipment/implants available, medications/allergies/relevent history reviewed, required imaging and test results available.  Performed  Maximum sterile technique was used including antiseptics, cap, gloves, gown, hand hygiene, mask and sheet. Skin prep: Chlorhexidine; local anesthetic administered A antimicrobial bonded/coated triple lumen catheter was placed in the right internal jugular vein using the Seldinger technique.  Evaluation Blood flow good Complications: No apparent complications Patient did tolerate procedure well. Chest X-ray ordered to verify placement.  CXR: pending.  Nelda BucksFEINSTEIN,DANIEL J. 09/18/2015, 5:44 PM  US guided  I perfromed this procedure  Mcarthur Rossettianiel J. Tyson AliasFeinstein, MD, FACP Pgr: [phone] [facility]

## 2015-09-18 NOTE — Progress Notes (Signed)
Family Medicine Teaching Service Daily Progress Note Intern Pager: [phone]  Patient name: Tanner RueCharles A Blondin Medical record number: [id number] Date of birth: November 26, 1940 Age: 75 y.o. Gender: male  Primary Care Provider: Uvaldo RisingFLETKE, KYLE, J, MD Consultants: Neurosurgery Code Status: Full  Pt Overview and Major Events to Date:  5/21: Admitted to FMTS with weakness and gait abnormality  Assessment and Plan: Tanner Mason is a 75 y.o. male presenting with weakness/ gait abnormality, hypokalemia . PMH is significant for lumbar radiculopathy affecting LLE, HTN, HLD, DM2 w/ peripheral neuropathy, CKD3, GERD  # Weakness/ Gait abnormality: Pt states weakness is improving. Neuro exam is normal with some numbness in the feet bilaterally. Orthostatics mildly positive with decrease in diastolic BP by 10. B12 471, TSH 1.323.  - Lumbar MRI ordered - Neurosurgery consulted by ED physician. Will not see him in the hospital because he does not need urgent surgery - Will get a Neurology consult. - Neuro checks q4hrs - Folate, HIV, RPR, ANA pending - PT/OT/SLP   # Hypokalemia, improved: K 2.9 on admission > 3.4 this morning. Mag 1.7. On KDur 20 meq outpatient. - S/p 10mEq KCl x 6 - Will restart home KDur 20mEq today. - Hold HCTZ - Daily BMETs  # DM2 w/ peripheral neuropathy: Last A1c 6.4 (06/2015). On Lantus 50u qd, humalog 02/09/13, and Neurontin 600mg  TID at home. CBGs 135-229 in the last 24 hours. Has required 3 units of Novolog since admission. - Sensitive SSI. Will give Lantus 25 units daily starting today (half of home dose). - CBG monitoring QAC and HS - Neurontin at 600mg  BID in the setting of CrCl ~38 (Max dose 700mg  BID).   # CKD3: Cr 1.85 > 1.68 (baseline 1.5-1.8). Appears euvolemic. - Avoid nephrotoxic agents - modify neurontin dose as above  # HTN: BP 154/80 this morning. On Norvasc 10mg , Lopressor 100mg , and HCTZ 12.5mg  - Hold HCTZ for now in the setting of hypokalemia -  Continue Lopressor and Norvasc - Monitor BPs  # GERD: on Omeprazole outpatient - Protonix 20mg  while hospitalized  # BPH: on Flomax and Proscar outpatient - Continue home meds  FEN/GI: Heart healthy, carb-mod diet; Protonix Prophylaxis: SCDs until seen by neurosx  Disposition: Waiting for Neurosurgery to see. Dispo pending PT/OT/SLP recs.  Subjective:  Pt states he feels like his weakness is getting better today. He endorses bilateral numbness in his feet that is worse than his baseline. He is very concerned about this. His feet have been completely numb but he started feeling tingling again this morning. He thinks his numbness is worsened by laying in the bed and not walking.  Objective: Temp:  [97.6 F (36.4 C)-98.2 F (36.8 C)] 98.2 F (36.8 C) (05/22 0534) Pulse Rate:  [72-88] 88 (05/22 0534) Resp:  [11-22] 20 (05/22 0534) BP: (118-158)/(63-88) 154/80 mmHg (05/22 0534) SpO2:  [97 %-100 %] 97 % (05/22 0534) Weight:  [165 lb (74.844 kg)] 165 lb (74.844 kg) (05/21 1800) Physical Exam: General: awake, alert, well appearing male, NAD, talkative HEENT: [place]/AT, EOMI, MMM Neck: supple Cardiovascular: RRR, no murmurs Respiratory: CTAB, normal WOB  Abdomen: +BS, soft, NT/ND MSK: Normal tone, no edema Skin: no rashes Neuro: Awake, alert, oriented, CN 2-12 grossly intact, 5/5 muscle strength in the upper and lower extremities bilaterally, numbness in feet bilaterally, otherwise normal sensation. Psych: mood stable, speech normal  Laboratory:  Recent Labs Lab 09/17/15 1158 09/18/15 0531  WBC 5.4 5.6  HGB 11.1* 11.4*  HCT 32.7* 33.5*  PLT 156 146*  Recent Labs Lab 09/17/15 1158 09/18/15 0531  NA 137 141  K 2.9* 3.4*  CL 100* 104  CO2 27 28  BUN 27* 17  CREATININE 1.85* 1.68*  CALCIUM 9.0 8.7*  PROT 7.1  --   BILITOT 1.0  --   ALKPHOS 75  --   ALT 29  --   AST 21  --   GLUCOSE 201* 191*   Mag 1.7 TSH 1.323 Vitamin B12 471 PT 15.0, INR 1.16 UA: no  bacteria, trace Hgb, neg ketones, neg leukocytes, no WBCs, no RBCs  Imaging/Diagnostic Tests: - CT head/ cervical spine 08/2105: reads reviewed from outside hospital (WNL, no acute processes).  - MRI brain/ cervical spine 08/2015: reads reviewed from outside hospital (No acute processes in brain, MRI cervical spine with mild-moderate degenerative changes.At C4-C5, there is a broad-based posterior disc osteophyte complex which mildly effaces the anterior cord without cord signal abnormality. - MRI lumbar 03/2014: The most prominent degenerative disease is at L4-L5. There is effacement of fat around the descending LEFT L5 nerve in the lateral recess dorsal to the L5 vertebra, suggesting an extruded or sequestered disc fragment in this patient with LEFT foot drop. Mild to moderate LEFT foraminal stenosis also potentially implicates the LEFT L4 nerve.  Campbell Stall, MD 09/18/2015, 7:27 AM PGY-1, [facility] Family Medicine FPTS Intern pager: [phone], text pages welcome

## 2015-09-18 NOTE — Progress Notes (Signed)
Inpatient Rehabilitation  PT is recommending IP Rehab.  Pt. has just had LP and results are pending.  Can consider an IP Rehab consult pending outcome of neuro work up.  Please call if questions.  Tanner PickingSusan Kimyah Frein PT Inpatient Rehab Admissions Coordinator Cell [phone] Office [phone]

## 2015-09-19 DIAGNOSIS — E876 Hypokalemia: Secondary | ICD-10-CM

## 2015-09-19 DIAGNOSIS — R2681 Unsteadiness on feet: Secondary | ICD-10-CM

## 2015-09-19 LAB — CBC
HCT: 36.1 % — ABNORMAL LOW (ref 39.0–52.0)
Hemoglobin: 12.5 g/dL — ABNORMAL LOW (ref 13.0–17.0)
MCH: 30.6 pg (ref 26.0–34.0)
MCHC: 34.6 g/dL (ref 30.0–36.0)
MCV: 88.5 fL (ref 78.0–100.0)
Platelets: 134 10*3/uL — ABNORMAL LOW (ref 150–400)
RBC: 4.08 MIL/uL — ABNORMAL LOW (ref 4.22–5.81)
RDW: 14.6 % (ref 11.5–15.5)
WBC: 9.4 10*3/uL (ref 4.0–10.5)

## 2015-09-19 LAB — BASIC METABOLIC PANEL
Anion gap: 11 (ref 5–15)
BUN: 24 mg/dL — ABNORMAL HIGH (ref 6–20)
CO2: 19 mmol/L — ABNORMAL LOW (ref 22–32)
Calcium: 8.5 mg/dL — ABNORMAL LOW (ref 8.9–10.3)
Chloride: 104 mmol/L (ref 101–111)
Creatinine, Ser: 1.76 mg/dL — ABNORMAL HIGH (ref 0.61–1.24)
GFR calc Af Amer: 42 mL/min — ABNORMAL LOW (ref >60)
GFR calc non Af Amer: 37 mL/min — ABNORMAL LOW (ref >60)
Glucose, Bld: 419 mg/dL — ABNORMAL HIGH (ref 65–99)
Potassium: 4 mmol/L (ref 3.5–5.1)
Sodium: 134 mmol/L — ABNORMAL LOW (ref 135–145)

## 2015-09-19 LAB — GLUCOSE, CAPILLARY
Glucose-Capillary: 346 mg/dL — ABNORMAL HIGH (ref 65–99)
Glucose-Capillary: 387 mg/dL — ABNORMAL HIGH (ref 65–99)
Glucose-Capillary: 308 mg/dL — ABNORMAL HIGH (ref 65–99)
Glucose-Capillary: 365 mg/dL — ABNORMAL HIGH (ref 65–99)

## 2015-09-19 LAB — FOLATE RBC
Folate, Hemolysate: 584.9 ng/mL
Folate, RBC: 1789 ng/mL (ref >498)
Hematocrit: 32.7 % — ABNORMAL LOW (ref 37.5–51.0)

## 2015-09-19 LAB — HEMOGLOBIN A1C
Hgb A1c MFr Bld: 7.4 % — ABNORMAL HIGH (ref 4.8–5.6)
Mean Plasma Glucose: 166 mg/dL

## 2015-09-19 LAB — CSF IGG: IgG, CSF: 8.7 mg/dL — ABNORMAL HIGH (ref 0.0–8.6)

## 2015-09-19 LAB — ANTINUCLEAR ANTIBODIES, IFA: ANA Ab, IFA: NEGATIVE

## 2015-09-19 MED ORDER — INSULIN GLARGINE 100 UNIT/ML ~~LOC~~ SOLN
32.0000 [IU] | Freq: Every day | SUBCUTANEOUS | Status: DC
  Administered 2015-09-20: 32 [IU] via SUBCUTANEOUS
  Filled 2015-09-19 (×2): qty 0.32

## 2015-09-19 MED ORDER — ACD FORMULA A 0.73-2.45-2.2 GM/100ML VI SOLN
Status: AC
  Filled 2015-09-19: qty 500

## 2015-09-19 MED ORDER — ALPRAZOLAM 0.25 MG PO TABS
0.2500 mg | ORAL_TABLET | Freq: Every evening | ORAL | Status: DC | PRN
  Administered 2015-09-19 – 2015-09-24 (×6): 0.25 mg via ORAL
  Filled 2015-09-19 (×6): qty 1

## 2015-09-19 NOTE — Progress Notes (Signed)
Inpatient Diabetes Program Recommendations  AACE/ADA: New Consensus Statement on Inpatient Glycemic Control (2015)  Target Ranges:  Prepandial:   less than 140 mg/dL      Peak postprandial:   less than 180 mg/dL (1-2 hours)      Critically ill patients:  140 - 180 mg/dL   Results for Dorthula RueKAMMEYER, Miciah A (MRN [id number]) as of 09/19/2015 11:38  Ref. Range 09/18/2015 07:58 09/18/2015 11:56 09/18/2015 17:01 09/18/2015 21:25 09/19/2015 08:13  Glucose-Capillary Latest Ref Range: 65-99 mg/dL 782229 (H) 956363 (H) 213280 (H) 256 (H) 346 (H)   Review of Glycemic Control  Diabetes history: DM 2 Outpatient Diabetes medications: Lantus 50 units, Victoza 1.8 Daily, Humalog 10 units breakfast, 14 units lunch, 14 units supper Current orders for Inpatient glycemic control: Lantus 25 units, Novolog Sensitive + HS scale  Inpatient Diabetes Program Recommendations: Insulin - Basal: Fasting glucose above goal >200 mg/dl. Please increase basal insulin to 32-24 units. Insulin - Meal Coverage: Patient takes 10-14 units of meal coverage at home. Please consider starting meal coverage at Novolog 5 units TID.  Thanks,  Christena DeemShannon Carlissa Pesola RN, MSN, [employer]CCN Inpatient Diabetes Coordinator Team Pager [phone] (8a-5p)

## 2015-09-19 NOTE — Progress Notes (Signed)
VC 2.84L NIF -40 with great effort

## 2015-09-19 NOTE — Progress Notes (Signed)
Family Medicine Teaching Service Daily Progress Note Intern Pager: [phone]  Patient name: Tanner Mason Medical record number: [id number] Date of birth: 03/23/1941 Age: 75 y.o. Gender: male  Primary Care Provider: Lupita Dawn, MD Consultants: Neurosurgery Code Status: Full  Pt Overview and Major Events to Date:  5/21: Admitted to FMTS with weakness and gait abnormality  Assessment and Plan: Tanner Mason is a 75 y.o. male presenting with weakness/ gait abnormality, hypokalemia . PMH is significant for lumbar radiculopathy affecting LLE, HTN, HLD, DM2 w/ peripheral neuropathy, CKD3, GERD  # Weakness/ Gait deficiency  likely 2/2 Guillain Barre: Neuro exam numbness in legs bilateral. Complained of some SOB this morning. Lumbar MRI . Overall stable appearance of the lumbar spine as compared to previous MRI from 04/13/2014. Orthostatics mildly positive with decrease in diastolic BP by 10. B12 471, TSH 1.323, HIV wnl, RPR wnl. CSF Protein 135, Glucose 148, WBC 1; CRP 0.6 and ESR 25 showing no signs of vasculitis.  - Continue telemetry, increased risk for arrhthymias  - ANA and Folate pending  - Patient received first PLEX last night, daily x 3 days, then every other day  - Watch INR on plasmapheresis  - NIFs BID, per neurology if trending down will notify neurology, if NIF -20 then consider elective intubation  - Neurology consult.rec - Neuro checks q4hrs - PT/OT/SLP   # DM2 w/ peripheral neuropathy:  CBGs 200-300s. Last A1c 6.4 (06/2015). On Lantus 50u qd, humalog 02/09/13, and Neurontin 630m TID at home.  - Sensitive SSI. Will give Lantus 32 units daily  - CBG monitoring QAC and HS - Neurontin at 6065mBID in the setting of CrCl ~38 (Max dose 70067mID).   # CKD3: Cr 1.85 > 1.68 (baseline 1.5-1.8). Appears euvolemic. - Avoid nephrotoxic agents - modify neurontin dose as above  # HTN: BP 146/81 this morning. On Norvasc 23m37mopressor 100mg52md HCTZ 12.5mg -33mld  HCTZ  - Continue Lopressor and Norvasc - Monitor BPs  # GERD: on Omeprazole outpatient - Protonix 20mg w20m hospitalized  # BPH: on Flomax and Proscar outpatient - Continue home meds  FEN/GI: Heart healthy, carb-mod diet; Protoni x Prophylaxis: SCDs until seen by neurosx  Disposition: Telemetry   Subjective:  Patient doing well this morning. Slight decreased SOB.   Objective: Temp:  [97.3 F (36.3 C)-98.1 F (36.7 C)] 97.4 F (36.3 C) (05/23 0639) Pulse Rate:  [64-91] 76 (05/23 0639) Resp:  [10-16] 16 (05/23 0639) BP: (108-168)/(62-96) 110/62 mmHg (05/23 0639) SpO2:  [98 %] 98 % (05/22 2127) Weight:  [159 lb 9.8 oz (72.4 kg)] 159 lb 9.8 oz (72.4 kg) (05/23 0639) [id number] Exam: General: awake, alert, well appearing male, NAD, talkative Cardiovascular: RRR, no murmurs Respiratory: CTAB, normal WOB  Abdomen: +BS, soft, NT/ND Neuro:  5/5 muscle strength in the upper and lower extremities bilaterally, numbness in feet bilaterally, has no proprioception    Laboratory:  Recent Labs Lab 09/17/15 1158 09/18/15 0531  WBC 5.4 5.6  HGB 11.1* 11.4*  HCT 32.7* 33.5*  PLT 156 146*    Recent Labs Lab 09/17/15 1158 09/18/15 0531  NA 137 141  K 2.9* 3.4*  CL 100* 104  CO2 27 28  BUN 27* 17  CREATININE 1.85* 1.68*  CALCIUM 9.0 8.7*  PROT 7.1  --   BILITOT 1.0  --   ALKPHOS 75  --   ALT 29  --   AST 21  --   GLUCOSE 201* 191*   Mag  1.7 TSH 1.323 Vitamin B12 471 PT 15.0, INR 1.16 UA: no bacteria, trace Hgb, neg ketones, neg leukocytes, no WBCs, no RBCs  Mr Lumbar Spine Wo Contrast  09/19/2015  CLINICAL DATA:  Initial evaluation for worsening numbness and weakness in legs. Patient with suspected Guillain-Barre syndrome. EXAM: MRI LUMBAR SPINE WITHOUT CONTRAST TECHNIQUE: Multiplanar, multisequence MR imaging of the lumbar spine was performed. No intravenous contrast was administered. COMPARISON:  Prior study from 04/13/2014. FINDINGS: Segmentation:  Normal  segmentation. Alignment: Vertebral bodies are normally aligned with preservation of the normal lumbar lordosis. No listhesis or malalignment. Vertebrae: Vertebral body heights well maintained. No acute or chronic fracture. Signal intensity within the vertebral body bone marrow is somewhat patchy and heterogeneous without focal osseous lesion. The is most commonly related to obesity, smoking, or chronic disease. This is stable from prior. No marrow edema. Conus medullaris: Extends to the L2 level and appears normal. Nerve roots of the cauda equina within normal limits. Paraspinal and other soft tissues: Paraspinous soft tissues demonstrate no acute abnormality. No retroperitoneal adenopathy. Visualized visceral structures within normal limits. Disc levels: T11-12: Seen only on sagittal projection. Disc desiccation without disc bulge. No stenosis. T12-L1: Seen only on sagittal projection. No disc bulge or disc protrusion. No stenosis. L1-2: Degenerative disc desiccation without significant disc bulge. Chronic endplate Schmorl's node at the inferior endplate of L1. No significant canal or foraminal stenosis. L2-3: Mild diffuse degenerative disc bulge without focal disc herniation. No focal disc herniation. Mild ligamentum flavum hypertrophy. No significant canal or foraminal stenosis. L3-4: Mild diffuse disc bulge with disc desiccation. Disc bulging is slightly eccentric to the left. Mild bilateral facet arthrosis with ligamentum flavum thickening. Minimal canal stenosis. Mild left foraminal narrowing. Again seen is mild effacement of the fat adjacent to the L5 nerve root, stable which may reflect a small sequestered disc fragment (series 7, image 25). This could potentially affect the transiting left L5 nerve root. This is similar to prior. L4-5: Disc desiccation with broad-based posterior disc bulge. Associated annular fissure posteriorly. Bilateral facet arthrosis with ligamentum flavum thickening. No significant  canal stenosis. Mild to moderate bilateral foraminal narrowing, slightly worse on the left. L5-S1: The mild disc bulge with disc desiccation. Superimposed shallow central disc protrusion with associated annular fissure. Mild bilateral facet arthrosis. No significant canal stenosis. Mild to mild bilateral foraminal narrowing. IMPRESSION: 1. Overall stable appearance of the lumbar spine as compared to previous MRI from 04/13/2014. No significant canal stenosis. No evidence for cord compression. 2. Similar effacement of the fat around the descending left L5 nerve root in the left L4-5 lateral recess, again suspicious for a small sequestered disc fragment. This could potentially affect the transiting left L5 nerve root. This is similar to prior. 3. Mild to moderate bilateral foraminal stenosis at L4-5 and L5-S1, stable. Electronically Signed   By: Jeannine Boga M.D.   On: 09/19/2015 01:54   Dg Chest Port 1 View  09/18/2015  CLINICAL DATA:  Guillian Barre syndrome. EXAM: PORTABLE CHEST 1 VIEW COMPARISON:  07/13/2014 FINDINGS: There has been interval placement of right internal jugular approach central venous catheter with tip overlying the expected location of superior vena cava. Cardiomediastinal silhouette is normal. Mediastinal contours appear intact. There is no evidence of focal airspace consolidation, pleural effusion or pneumothorax. Osseous structures are without acute abnormality. Healed right-sided rib fractures are noted. Soft tissues are grossly normal. IMPRESSION: No active disease. Right internal jugular approach central venous catheter in satisfactory radiologic position. Electronically Signed  By: Fidela Salisbury M.D.   On: 09/18/2015 18:09    Taquana Bartley Cletis Media, MD 09/19/2015, 7:30 AM PGY-1, [place] Intern pager: [phone], text pages welcome

## 2015-09-19 NOTE — Progress Notes (Signed)
At 440 am pt appeared to be in atrial flutter , converted to nsr at 452 during rinse back from tpe before ekg could be done.

## 2015-09-19 NOTE — Progress Notes (Signed)
Subjective: Received PLEX in the wee hours of the morning.   Exam: Filed Vitals:   09/19/15 0639 09/19/15 0845  BP: 110/62 146/81  Pulse: 76 79  Temp: 97.4 F (36.3 C)   Resp: 16    Gen: In bed, NAD Resp: non-labored breathing, no acute distress Abd: soft, nt  Neuro: MS: awake, alert, interactive CN: PERRL, EOMI Motor: 4/5 weakness at hip flexion, knee extension, 4-/5 dorsiflexion, 4/5 plantarflexion.  Sensory:decreased to LT to just above the knees bilaterally, decreased to the wrist bilaterally, more in the C8 distribution on the right.  GYL:UDAPTC  Pertinent Labs: ESR borderline, CRP normal  Impression: 75 yo M with ascending weakness and paresthesias with elevated CSF protein without pleocytosis, all consistent with Guillian-Barr syndrome. He had his first treatment with PLEX today.   Recommendations: 1) PLEX 1/5 today, will treat tomorrow and Thursday, then QOD for total of 5 treatments 2) Physical therapy.  3) will continue to follow.   Roland Rack, MD [facility] [phone]  If 7pm- 7am, please page neurology on call as listed in [place].

## 2015-09-19 NOTE — Progress Notes (Signed)
RT has done VC and NIF with patient. NIF -40 VC 1.65L

## 2015-09-19 NOTE — Evaluation (Signed)
Occupational Therapy Evaluation Patient Details Name: Tanner Mason MRN: [id number] DOB: 15-May-1940 Today's Date: 09/19/2015    History of Present Illness Tanner Mason is an 75 y.o. Tanner patient had abrupt onset of numbness and tingling in his b/l hands on Saturday in addition to numbness in his feet bilaterally. He has known diabetic neuropathy but this was different. Marland Kitchen He was evaluated at an outside hospital who imaged his brain and cervical spine. Reports that no acute processes were found and discharged him home. HE has also had a MRI of brain and cervical spine (which I cannot find on the floor) and he was told they were normal. Due to worsening balance issues and weakness he came to ED at [facility]. Three days after the initial start date he now cannot stand on his own due to significant weakness of his legs. He also has worsening numbness in his legs. HE has no reflexes in his Knees and states he usually does.    Clinical Impression   Pt admitted with the above diagnosis and has the deficits listed below. Pt would benefit from cont OT to increase strength and independence/safety with adls and adl transfers so he can eventually d/c home with his wife. Pt is an excellent rehab candidate with a supportive family and is very motivated.  Feel he could reach mod I level of care with rehab.     Follow Up Recommendations  CIR;Supervision/Assistance - 24 hour    Equipment Recommendations  3 in 1 bedside comode    Recommendations for Other Services Rehab consult     Precautions / Restrictions Precautions Precautions: Fall Precaution Comments: Pt is fall risk.  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  Talked to him at length about why he always needs to call for help when getting onto his feet. Restrictions Weight Bearing Restrictions: No      Mobility Bed Mobility Overal bed mobility: Needs Assistance Bed Mobility: Supine to Sit     Supine to sit: Min  assist;HOB elevated     General bed mobility comments: pt required min assist to get fulling into sitting on EOB.  Transfers Overall transfer level: Needs assistance Equipment used: Rolling walker (2 wheeled) Transfers: Sit to/from Stand;Lateral/Scoot Transfers Sit to Stand: Mod assist (w knees blocked and rocking for momentum.)        Lateral/Scoot Transfers: Min assist General transfer comment: Pt did very well with scoot transfers which should make it easier for nursing to get pt back in bed.  Pt stood with RW and someone blocking his knees with +2 max assist to start.  When edcated to rock and go on "3" pt was able to stand x2 with mod assist x1.  Pt must lock knees out to remain in standing.     Balance Overall balance assessment: Needs assistance Sitting-balance support: Feet supported Sitting balance-Leahy Scale: Good     Standing balance support: Bilateral upper extremity supported;During functional activity Standing balance-Leahy Scale: Zero Standing balance comment: Pt must have RW and outside support to remain standging.                            ADL Overall ADL's : Needs assistance/impaired Eating/Feeding: Set up;Sitting Eating/Feeding Details (indicate cue type and reason): not coordinated but he can do it.  Some issues with cutting food. Grooming: Wash/dry hands;Wash/dry face;Oral care;Set up;Sitting Grooming Details (indicate cue type and reason): Pt fatigues  quickly.  Pt did put shower cap on head and did rinseless hair wash with min assist to get help to finish b/c arms fatigue overhead. Upper Body Bathing: Minimal assitance;Sitting Upper Body Bathing Details (indicate cue type and reason): assist to be thorough Lower Body Bathing: Moderate assistance;Sit to/from stand Lower Body Bathing Details (indicate cue type and reason): mod assist needed to get to his feet and then he holds to walker while therapist washes bottom.  Pt locks out his knees in  standing or they buckle.  Pt is a fall risk in standing. Upper Body Dressing : Set up;Sitting Upper Body Dressing Details (indicate cue type and reason): assist with buttons. Lower Body Dressing: Moderate assistance;Sit to/from stand Lower Body Dressing Details (indicate cue type and reason): assist to pull pants up.  Pt can stand but has to lock knees out and hold to walker while someone else manages pants in standing. Toilet Transfer: Minimal assistance;Requires drop arm;BSC (lateral scoot) Toilet Transfer Details (indicate cue type and reason): Pt laterally scoots very well which gives him some independence with transfers but is unable to stand and transfer to commode without +2 assist due to knees buckling. Toileting- Clothing Manipulation and Hygiene: Minimal assistance;Sitting/lateral lean     Tub/Shower Transfer Details (indicate cue type and reason): discussed transfers.  Will see how pt progresses but might be better using tub shower with a bench for safety unless he can become more independent standing. Functional mobility during ADLs: Moderate assistance;+2 for physical assistance;Rolling walker;Cueing for safety;Cueing for sequencing General ADL Comments: Pt able to do most adls safely in sitting but needs a great amount of assist when he is on his feet standing for adls.     Vision Vision Assessment?: No apparent visual deficits   Perception Perception Perception Tested?: No   Praxis Praxis Praxis tested?: Within functional limits    Pertinent Vitals/Pain Pain Assessment: No/denies pain     Hand Dominance Right   Extremity/Trunk Assessment Upper Extremity Assessment Upper Extremity Assessment: RUE deficits/detail;LUE deficits/detail RUE Deficits / Details: Strength overall 4/5.  Proprioception and kinestetic awareness is impaired.   RUE Sensation: decreased proprioception RUE Coordination: decreased fine motor;decreased gross motor LUE Deficits / Details: Strength  overall 4/5.  Fatigues quickly LUE Sensation: decreased proprioception LUE Coordination: decreased fine motor;decreased gross motor   Lower Extremity Assessment Lower Extremity Assessment: Defer to PT evaluation   Cervical / Trunk Assessment Cervical / Trunk Assessment: Normal   Communication Communication Communication: No difficulties   Cognition Arousal/Alertness: Awake/alert Behavior During Therapy: WFL for tasks assessed/performed Overall Cognitive Status: Within Functional Limits for tasks assessed                     General Comments       Exercises       Shoulder Instructions      Home Living Family/patient expects to be discharged to:: Private residence Living Arrangements: Spouse/significant other Available Help at Discharge: Family Type of Home: House Home Access: Stairs to enter Secretary/administratorntrance Stairs-Number of Steps: 4 Entrance Stairs-Rails: None Home Layout: One level     Bathroom Shower/Tub: Producer, television/film/videoWalk-in shower   Bathroom Toilet: Handicapped height     Home Equipment: Crutches;Other (comment);Grab bars - tub/shower          Prior Functioning/Environment Level of Independence: Independent        Comments: Very active.    OT Diagnosis: Generalized weakness   OT Problem List: Decreased strength;Impaired balance (sitting and/or standing);Decreased coordination;Decreased safety awareness;Decreased  knowledge of use of DME or AE;Decreased knowledge of precautions;Impaired sensation;Impaired UE functional use   OT Treatment/Interventions: Self-care/ADL training;Neuromuscular education;DME and/or AE instruction;Therapeutic activities;Balance training;Patient/family education    OT Goals(Current goals can be found in the care plan section) Acute Rehab OT Goals Patient Stated Goal: to get back to walking OT Goal Formulation: With patient/family Time For Goal Achievement: 10/03/15 Potential to Achieve Goals: Good ADL Goals Pt Will Perform Grooming:  with supervision;standing Pt Will Perform Lower Body Bathing: with supervision;sit to/from stand Pt Will Perform Lower Body Dressing: with supervision;sit to/from stand Pt Will Transfer to Toilet: with supervision;bedside commode (drop arm lateral scoot) Pt Will Perform Toileting - Clothing Manipulation and hygiene: with supervision;sitting/lateral leans Pt Will Perform Tub/Shower Transfer: Shower transfer;with mod assist;ambulating;shower seat;grab bars;rolling walker Pt/caregiver will Perform Home Exercise Program: Increased strength;Both right and left upper extremity;With theraband;With theraputty;With Supervision Additional ADL Goal #1: Pt will instruct others in his care when it comes to setting up for transfers and adls with no VCS.  OT Frequency: Min 3X/week   Barriers to D/C:    has 24 hour S.       Co-evaluation              End of Session Equipment Utilized During Treatment: Engineer, water Communication: Mobility status  Activity Tolerance: Patient tolerated treatment well Patient left: in chair;with call bell/phone within reach;with family/visitor present   Time: 1200-1247 OT Time Calculation (min): 47 min Charges:  OT General Charges $OT Visit: 1 Procedure OT Evaluation $OT Eval Moderate Complexity: 1 Procedure OT Treatments $Self Care/Home Management : 8-22 mins G-Codes:    Hope Budds 09/28/2015, 1:10 PM  [phone]

## 2015-09-19 NOTE — Progress Notes (Signed)
Physical Therapy Treatment Patient Details Name: Tanner Mason MRN: [id number] DOB: [birth year] Today's Date: 09/19/2015    History of Present Illness Tanner Mason is an 75 y.o. male patient had abrupt onset of numbness and tingling in his b/l hands on Saturday in addition to numbness in his feet bilaterally. He has known diabetic neuropathy but this was different. Marland Kitchen. He was evaluated at an outside hospital who imaged his brain and cervical spine. Reports that no acute processes were found and discharged him home. HE has also had a MRI of brain and cervical spine (which I cannot find on the floor) and he was told they were normal. Due to worsening balance issues and weakness [facility]. Three days after the initial start date he now cannot stand on his own due to significant weakness of his legs. He also has worsening numbness in his legs. HE has no reflexes in his Knees and states he usually does.     PT Comments    Patient is making progress toward mobility goals and is motivated to participate in therapy. Continue to progress as tolerated. Current plan remains appropriate.   Follow Up Recommendations  CIR     Equipment Recommendations  Other (comment)    Recommendations for Other Services Rehab consult     Precautions / Restrictions Precautions Precautions: Fall Restrictions Weight Bearing Restrictions: No    Mobility  Bed Mobility Overal bed mobility: Needs Assistance Bed Mobility: Supine to Sit     Supine to sit: Supervision;HOB elevated     General bed mobility comments: supervision for safety; HOB elevated when coming to sit; increased time needed; cues to push through bilat UE to scoot in bed   Transfers Overall transfer level: Needs assistance Equipment used: Rolling walker (2 wheeled) Transfers: Sit to/from Stand Sit to Stand: Min assist;From elevated surface;+2 safety/equipment         General transfer comment: pt used momentum to  push up into standing with assist to maintain balance and achieve erect posture with cues for hand placement and technique; X 2 from EOB  Ambulation/Gait Ambulation/Gait assistance: Mod assist;+2 physical assistance Ambulation Distance (Feet): 20 Feet (715ft X 4) Assistive device: Rolling walker (2 wheeled) Gait Pattern/deviations: Ataxic     General Gait Details: side steps 555ft X4 in from of bed for safety; pt with ataxia and decreased proprioception requiring cues for safe foot placement with tendency for narrow BOS; assist to maintain balance; seated rest break due to fatigue   Stairs            Wheelchair Mobility    Modified Rankin (Stroke Patients Only)       Balance Overall balance assessment: Needs assistance Sitting-balance support: No upper extremity supported;Feet supported Sitting balance-Leahy Scale: Good     Standing balance support: Bilateral upper extremity supported Standing balance-Leahy Scale: Poor                      Cognition Arousal/Alertness: Awake/alert Behavior During Therapy: WFL for tasks assessed/performed Overall Cognitive Status: Within Functional Limits for tasks assessed                      Exercises      General Comments        Pertinent Vitals/Pain Pain Assessment: No/denies pain    Home Living  Prior Function            PT Goals (current goals can now be found in the care plan section) Acute Rehab PT Goals Patient Stated Goal: to get back to walking Progress towards PT goals: Progressing toward goals    Frequency  Min 4X/week    PT Plan Current plan remains appropriate    Co-evaluation             End of Session Equipment Utilized During Treatment: Gait belt Activity Tolerance: Patient limited by fatigue Patient left: in bed;with call bell/phone within reach;with bed alarm set;with family/visitor present     Time: 1501-1520 PT Time Calculation (min)  (ACUTE ONLY): 19 min  Charges:  $Gait Training: 8-22 mins                    G Codes:      Derek Mound, PTA Pager: [phone]   09/19/2015, 4:21 PM

## 2015-09-20 DIAGNOSIS — E1140 Type 2 diabetes mellitus with diabetic neuropathy, unspecified: Secondary | ICD-10-CM | POA: Insufficient documentation

## 2015-09-20 LAB — COMPREHENSIVE METABOLIC PANEL
ALT: 21 U/L (ref 17–63)
AST: 25 U/L (ref 15–41)
Albumin: 4.3 g/dL (ref 3.5–5.0)
Alkaline Phosphatase: 44 U/L (ref 38–126)
Anion gap: 10 (ref 5–15)
BUN: 29 mg/dL — ABNORMAL HIGH (ref 6–20)
CO2: 21 mmol/L — ABNORMAL LOW (ref 22–32)
Calcium: 8.9 mg/dL (ref 8.9–10.3)
Chloride: 103 mmol/L (ref 101–111)
Creatinine, Ser: 1.89 mg/dL — ABNORMAL HIGH (ref 0.61–1.24)
GFR calc Af Amer: 39 mL/min — ABNORMAL LOW (ref >60)
GFR calc non Af Amer: 34 mL/min — ABNORMAL LOW (ref >60)
Glucose, Bld: 354 mg/dL — ABNORMAL HIGH (ref 65–99)
Potassium: 3.1 mmol/L — ABNORMAL LOW (ref 3.5–5.1)
Sodium: 134 mmol/L — ABNORMAL LOW (ref 135–145)
Total Bilirubin: 1.1 mg/dL (ref 0.3–1.2)
Total Protein: 5.9 g/dL — ABNORMAL LOW (ref 6.5–8.1)

## 2015-09-20 LAB — GLUCOSE, CAPILLARY
Glucose-Capillary: 317 mg/dL — ABNORMAL HIGH (ref 65–99)
Glucose-Capillary: 433 mg/dL — ABNORMAL HIGH (ref 65–99)
Glucose-Capillary: 300 mg/dL — ABNORMAL HIGH (ref 65–99)
Glucose-Capillary: 269 mg/dL — ABNORMAL HIGH (ref 65–99)

## 2015-09-20 LAB — CBC
HCT: 33.8 % — ABNORMAL LOW (ref 39.0–52.0)
Hemoglobin: 11.4 g/dL — ABNORMAL LOW (ref 13.0–17.0)
MCH: 29.2 pg (ref 26.0–34.0)
MCHC: 33.7 g/dL (ref 30.0–36.0)
MCV: 86.7 fL (ref 78.0–100.0)
Platelets: 138 10*3/uL — ABNORMAL LOW (ref 150–400)
RBC: 3.9 MIL/uL — ABNORMAL LOW (ref 4.22–5.81)
RDW: 14.6 % (ref 11.5–15.5)
WBC: 7.1 10*3/uL (ref 4.0–10.5)

## 2015-09-20 LAB — MRSA PCR SCREENING: MRSA by PCR: NEGATIVE

## 2015-09-20 LAB — PROTIME-INR
INR: 1.3 (ref 0.00–1.49)
Prothrombin Time: 16.3 seconds — ABNORMAL HIGH (ref 11.6–15.2)

## 2015-09-20 MED ORDER — CALCIUM CARBONATE ANTACID 500 MG PO CHEW
CHEWABLE_TABLET | ORAL | Status: AC
  Filled 2015-09-20: qty 2

## 2015-09-20 MED ORDER — SODIUM CHLORIDE 0.9 % IV SOLN
4.0000 g | Freq: Once | INTRAVENOUS | Status: AC
  Administered 2015-09-20: 4 g via INTRAVENOUS
  Filled 2015-09-20: qty 40

## 2015-09-20 MED ORDER — SODIUM CHLORIDE 0.9 % IV SOLN
INTRAVENOUS | Status: AC
  Administered 2015-09-20: 15:00:00 via INTRAVENOUS_CENTRAL
  Filled 2015-09-20 (×3): qty 200

## 2015-09-20 MED ORDER — ACD FORMULA A 0.73-2.45-2.2 GM/100ML VI SOLN
500.0000 mL | Status: DC
  Administered 2015-09-20: 500 mL via INTRAVENOUS
  Filled 2015-09-20: qty 500

## 2015-09-20 MED ORDER — ACETAMINOPHEN 325 MG PO TABS: 650.0000 mg | ORAL_TABLET | ORAL | Status: DC | PRN

## 2015-09-20 MED ORDER — DIPHENHYDRAMINE HCL 25 MG PO CAPS: 25.0000 mg | ORAL_CAPSULE | Freq: Four times a day (QID) | ORAL | Status: DC | PRN

## 2015-09-20 MED ORDER — HEPARIN SODIUM (PORCINE) 1000 UNIT/ML IJ SOLN: 1000.0000 [IU] | Freq: Once | INTRAMUSCULAR | Status: DC

## 2015-09-20 MED ORDER — ACD FORMULA A 0.73-2.45-2.2 GM/100ML VI SOLN
Status: AC
  Administered 2015-09-20: 500 mL via INTRAVENOUS
  Filled 2015-09-20: qty 500

## 2015-09-20 MED ORDER — CALCIUM CARBONATE ANTACID 500 MG PO CHEW: 2.0000 | CHEWABLE_TABLET | ORAL | Status: DC

## 2015-09-20 NOTE — Progress Notes (Signed)
Family Medicine Teaching Service Daily Progress Note Intern Pager: [phone]  Patient name: Tanner Mason Medical record number: [id number] Date of birth: 05/05/1940 Age: 75 y.o. Gender: male  Primary Care Provider: Lupita Dawn, MD Consultants: Neurosurgery Code Status: Full  Pt Overview and Major Events to Date:  5/21: Admitted to FMTS with weakness and gait abnormality  Assessment and Plan: Tanner Mason is a 75 y.o. male presenting with weakness/ gait abnormality, hypokalemia . PMH is significant for lumbar radiculopathy affecting LLE, HTN, HLD, DM2 w/ peripheral neuropathy, CKD3, GERD  # Weakness/Gait abnormality 2/2 Guillain Barre: Still having numbness in his feet bilaterally. B12 471, Folate 1789, TSH 1.323, HIV wnl, RPR wnl, ANA neg. CSF Protein 135, Glucose 148, WBC 1. - Continue telemetry for increased risk for arrhythmias  - PLEX daily x 3 days (5/23-5/25), then every other day for a total of 5 treatments. - Watch INR on plasmapheresis. INR 1.3 this morning. - Xanax 0.52m prn for anxiety during plasmapheresis. - NIFs BID. Have been -40 last night and this morning. Will continue to monitor q12 hours. - Neurology following, appreciate recommendations. - Neuro checks q4hrs - PT/OT recommending CIR   # DM2 w/ peripheral neuropathy:  CBGs in the 300s. Last A1c 6.4 (06/2015). On Lantus 50u qd, humalog 02/09/13, and Neurontin 6011mTID at home. Has required 38 units of Novolog in the last 24 hours. - Will increase Lantus from 25 units to 32 units today. May need to continue to increase daily. - Sensitive SSI - CBG monitoring QAC and HS - Neurontin at 60053mID    # CKD3: Cr 1.85 > 1.68 > 1.76 (baseline 1.5-1.8). Appears euvolemic. - Avoid nephrotoxic agents - Daily BMETs  # HTN: BP 112/92 this morning. On Norvasc 62m75mopressor 100mg61md HCTZ 12.5mg -53mlding HCTZ  - Continue Lopressor and Norvasc - Monitor BPs  # GERD: on Omeprazole outpatient -  Protonix 20mg w4m hospitalized  # BPH: on Flomax and Proscar outpatient - Continue home meds  FEN/GI: Heart healthy, carb-mod diet; Protonix Prophylaxis: SCDs until seen by neurosx  Disposition: Telemetry   Subjective:  Pt doing well this morning. His first plasmapheresis treatment went well. He has been getting up to the chair. No concerns this morning.  Objective: Temp:  [97.4 F (36.3 C)-98 F (36.7 C)] 97.4 F (36.3 C) (05/24 0609) Pulse Rate:  [71-80] 80 (05/24 0609) Resp:  [16-18] 16 (05/23 2209) BP: (107-146)/(59-92) 112/92 mmHg (05/24 0609) SpO2:  [98 %-100 %] 100 % (05/24 0609) Weight:  [156 lb 4.9 oz (70.9 kg)] 156 lb 4.9 oz (70.9 kg) (05/24 0609) [id number] Exam: General: awake, alert, well appearing male, NAD, talkative Cardiovascular: RRR, no murmurs Respiratory: CTAB, normal WOB  Abdomen: +BS, soft, NT/ND Neuro:  5/5 muscle strength in the upper and lower extremities bilaterally, numbness in feet bilaterally, has no proprioception  Psych: Appropriate affect, normal behavior  Laboratory:  Recent Labs Lab 09/17/15 1158 09/17/15 1603 09/18/15 0531 09/19/15 1136  WBC 5.4  --  5.6 9.4  HGB 11.1*  --  11.4* 12.5*  HCT 32.7* 32.7* 33.5* 36.1*  PLT 156  --  146* 134*    Recent Labs Lab 09/17/15 1158 09/18/15 0531 09/19/15 1136  NA 137 141 134*  K 2.9* 3.4* 4.0  CL 100* 104 104  CO2 27 28 19*  BUN 27* 17 24*  CREATININE 1.85* 1.68* 1.76*  CALCIUM 9.0 8.7* 8.5*  PROT 7.1  --   --   BILITOT 1.0  --   --  ALKPHOS 75  --   --   ALT 29  --   --   AST 21  --   --   GLUCOSE 201* 191* 419*   Mag 1.7 TSH 1.323 Vitamin B12 471 PT 15.0, INR 1.16 UA: no bacteria, trace Hgb, neg ketones, neg leukocytes, no WBCs, no RBCs CRP 0.6 and ESR 25  Mr Lumbar Spine Wo Contrast  09/19/2015  CLINICAL DATA:  Initial evaluation for worsening numbness and weakness in legs. Patient with suspected Guillain-Barre syndrome. EXAM: MRI LUMBAR SPINE WITHOUT CONTRAST  TECHNIQUE: Multiplanar, multisequence MR imaging of the lumbar spine was performed. No intravenous contrast was administered. COMPARISON:  Prior study from 04/13/2014. FINDINGS: Segmentation:  Normal segmentation. Alignment: Vertebral bodies are normally aligned with preservation of the normal lumbar lordosis. No listhesis or malalignment. Vertebrae: Vertebral body heights well maintained. No acute or chronic fracture. Signal intensity within the vertebral body bone marrow is somewhat patchy and heterogeneous without focal osseous lesion. The is most commonly related to obesity, smoking, or chronic disease. This is stable from prior. No marrow edema. Conus medullaris: Extends to the L2 level and appears normal. Nerve roots of the cauda equina within normal limits. Paraspinal and other soft tissues: Paraspinous soft tissues demonstrate no acute abnormality. No retroperitoneal adenopathy. Visualized visceral structures within normal limits. Disc levels: T11-12: Seen only on sagittal projection. Disc desiccation without disc bulge. No stenosis. T12-L1: Seen only on sagittal projection. No disc bulge or disc protrusion. No stenosis. L1-2: Degenerative disc desiccation without significant disc bulge. Chronic endplate Schmorl's node at the inferior endplate of L1. No significant canal or foraminal stenosis. L2-3: Mild diffuse degenerative disc bulge without focal disc herniation. No focal disc herniation. Mild ligamentum flavum hypertrophy. No significant canal or foraminal stenosis. L3-4: Mild diffuse disc bulge with disc desiccation. Disc bulging is slightly eccentric to the left. Mild bilateral facet arthrosis with ligamentum flavum thickening. Minimal canal stenosis. Mild left foraminal narrowing. Again seen is mild effacement of the fat adjacent to the L5 nerve root, stable which may reflect a small sequestered disc fragment (series 7, image 25). This could potentially affect the transiting left L5 nerve root. This  is similar to prior. L4-5: Disc desiccation with broad-based posterior disc bulge. Associated annular fissure posteriorly. Bilateral facet arthrosis with ligamentum flavum thickening. No significant canal stenosis. Mild to moderate bilateral foraminal narrowing, slightly worse on the left. L5-S1: The mild disc bulge with disc desiccation. Superimposed shallow central disc protrusion with associated annular fissure. Mild bilateral facet arthrosis. No significant canal stenosis. Mild to mild bilateral foraminal narrowing. IMPRESSION: 1. Overall stable appearance of the lumbar spine as compared to previous MRI from 04/13/2014. No significant canal stenosis. No evidence for cord compression. 2. Similar effacement of the fat around the descending left L5 nerve root in the left L4-5 lateral recess, again suspicious for a small sequestered disc fragment. This could potentially affect the transiting left L5 nerve root. This is similar to prior. 3. Mild to moderate bilateral foraminal stenosis at L4-5 and L5-S1, stable. Electronically Signed   By: Jeannine Boga M.D.   On: 09/19/2015 01:54   Dg Chest Port 1 View  09/18/2015  CLINICAL DATA:  Guillian Barre syndrome. EXAM: PORTABLE CHEST 1 VIEW COMPARISON:  07/13/2014 FINDINGS: There has been interval placement of right internal jugular approach central venous catheter with tip overlying the expected location of superior vena cava. Cardiomediastinal silhouette is normal. Mediastinal contours appear intact. There is no evidence of focal airspace consolidation, pleural  effusion or pneumothorax. Osseous structures are without acute abnormality. Healed right-sided rib fractures are noted. Soft tissues are grossly normal. IMPRESSION: No active disease. Right internal jugular approach central venous catheter in satisfactory radiologic position. Electronically Signed   By: Fidela Salisbury M.D.   On: 09/18/2015 18:09    Sela Hua, MD 09/20/2015, 7:22 AM PGY-1,  [place] Intern pager: [phone], text pages welcome

## 2015-09-20 NOTE — Progress Notes (Signed)
VC =2.2L  NIF -45 Pt had good effort.

## 2015-09-20 NOTE — Progress Notes (Signed)
RT has done VC and NIF with patient. NIF -44 VC 1.85

## 2015-09-20 NOTE — Care Management Important Message (Signed)
Important Message  Patient Details  Name: Dorthula RueCharles A Pekar MRN: [id number] Date of Birth: Oct 20, 1940   Medicare Important Message Given:  Yes    Bernadette HoitShoffner, Cerria Randhawa Coleman 09/20/2015, 11:28 AM

## 2015-09-20 NOTE — Progress Notes (Addendum)
Paged resident regarding CBG 433. Waiting to hear back from resident. Pt A&O; with no complaints. Will continue to monitor pt.

## 2015-09-20 NOTE — Progress Notes (Signed)
Subjective: No change felt. He was anxious during HD yesterday and would like his wife to go with him. He also noted he was MRSA (+) in the past.   Exam: Filed Vitals:   09/20/15 0609 09/20/15 0900  BP: 112/92 101/52  Pulse: 80 76  Temp: 97.4 F (36.3 C)   Resp:       Neuro: MS: awake, alert, interactive CN: PERRL, EOMI Motor: 4/5 weakness at hip flexion, knee extension, 4-/5 dorsiflexion, 4/5 plantarflexion.  Sensory:decreased to LT to just above the knees bilaterally, decreased to the wrist bilaterally, more in the C8 distribution on the right.  BJY:NWGNFATR:absent     Pertinent Labs/Diagnostics: BG 317  Felicie MornDavid Smith PA-C [facility] [phone]   NIFs have been stable as is his exam.   Impression: 75 yo M with ascending weakness and paresthesias with elevated CSF protein without pleocytosis, all consistent with Guillian-Barr syndrome. He is going to have his second treatment with PLEX today.   Recommendations: 1) PLEX 2/5 today, will treat again tomorrow and then QOD for total 5 treatments(last tx Monday)  2) Physical therapy.  3) will continue to follow.    Ritta SlotMcNeill Hudson Lehmkuhl, MD [facility] [phone]  If 7pm- 7am, please page neurology on call as listed in AMION.  09/20/2015, 10:36 AM

## 2015-09-20 NOTE — Progress Notes (Signed)
Physical Therapy Treatment Patient Details Name: Tanner Mason MRN: [id number] DOB: 12/26/1940 Today's Date: 09/20/2015    History of Present Illness Tanner Mason is an 75 y.o. male patient had abrupt onset of numbness and tingling in his b/l hands on Saturday in addition to numbness in his feet bilaterally. He has known diabetic neuropathy but this was different. Marland Kitchen. He was evaluated at an outside hospital who imaged his brain and cervical spine. Reports that no acute processes were found and discharged him home. HE has also had a MRI of brain and cervical spine (which I cannot find on the floor) and he was told they were normal. Due to worsening balance issues and weakness [facility] Three days after the initial start date he now cannot stand on his own due to significant weakness of his legs. He also has worsening numbness in his legs. HE has no reflexes in his Knees and states he usually does.     PT Comments    Patient continues to make progress toward mobility goals with ability to ambulate with mod/max A +2. Continue to progress as tolerated.   Follow Up Recommendations  CIR     Equipment Recommendations  Other (comment)    Recommendations for Other Services Rehab consult     Precautions / Restrictions Precautions Precautions: Fall Restrictions Weight Bearing Restrictions: No    Mobility  Bed Mobility               General bed mobility comments: OOB in chair upon arrival  Transfers Overall transfer level: Needs assistance Equipment used: Rolling walker (2 wheeled) Transfers: Sit to/from Stand Sit to Stand: Min assist;Mod assist;+2 physical assistance         General transfer comment: mod A first trial and min A second trial from recliner with +2, pt using momentum, and cues for hand placement and technique  Ambulation/Gait Ambulation/Gait assistance: Mod assist;Max assist;+2 physical assistance Ambulation Distance (Feet): 30 Feet  (12, 18) Assistive device: Rolling walker (2 wheeled) Gait Pattern/deviations: Ataxic     General Gait Details: pt ambulated 12 ft with mod/max A +2 with max multimodal cues for posture, locking bilat knees, and managing RW; pt with flexed hips and knees with each step and when fatigued begins scissoring and with increased difficulty maintaining knee extension in stance phase   Stairs            Wheelchair Mobility    Modified Rankin (Stroke Patients Only)       Balance     Sitting balance-Leahy Scale: Good       Standing balance-Leahy Scale: Poor                      Cognition Arousal/Alertness: Awake/alert Behavior During Therapy: WFL for tasks assessed/performed Overall Cognitive Status: Within Functional Limits for tasks assessed                      Exercises      General Comments        Pertinent Vitals/Pain      Home Living                      Prior Function            PT Goals (current goals can now be found in the care plan section) Acute Rehab PT Goals Patient Stated Goal: to get back to walking Progress towards PT goals:  Progressing toward goals    Frequency  Min 4X/week    PT Plan Current plan remains appropriate    Co-evaluation             End of Session Equipment Utilized During Treatment: Gait belt Activity Tolerance: Patient tolerated treatment well Patient left: with call bell/phone within reach;with family/visitor present;in chair;with nursing/sitter in room     Time: 0906-0930 PT Time Calculation (min) (ACUTE ONLY): 24 min  Charges:  $Gait Training: 23-37 mins                    G Codes:      Derek Mound, PTA Pager: [phone]   09/20/2015, 9:43 AM

## 2015-09-21 LAB — POCT I-STAT, CHEM 8
BUN: 29 mg/dL — ABNORMAL HIGH (ref 6–20)
BUN: 23 mg/dL — ABNORMAL HIGH (ref 6–20)
Calcium, Ion: 1.2 mmol/L (ref 1.13–1.30)
Calcium, Ion: 1.16 mmol/L (ref 1.13–1.30)
Chloride: 99 mmol/L — ABNORMAL LOW (ref 101–111)
Chloride: 105 mmol/L (ref 101–111)
Creatinine, Ser: 1.8 mg/dL — ABNORMAL HIGH (ref 0.61–1.24)
Creatinine, Ser: 1.5 mg/dL — ABNORMAL HIGH (ref 0.61–1.24)
Glucose, Bld: 348 mg/dL — ABNORMAL HIGH (ref 65–99)
Glucose, Bld: 272 mg/dL — ABNORMAL HIGH (ref 65–99)
HCT: 33 % — ABNORMAL LOW (ref 39.0–52.0)
HCT: 34 % — ABNORMAL LOW (ref 39.0–52.0)
Hemoglobin: 11.2 g/dL — ABNORMAL LOW (ref 13.0–17.0)
Hemoglobin: 11.6 g/dL — ABNORMAL LOW (ref 13.0–17.0)
Potassium: 3.3 mmol/L — ABNORMAL LOW (ref 3.5–5.1)
Potassium: 3.4 mmol/L — ABNORMAL LOW (ref 3.5–5.1)
Sodium: 137 mmol/L (ref 135–145)
Sodium: 141 mmol/L (ref 135–145)
TCO2: 20 mmol/L (ref 0–100)
TCO2: 19 mmol/L (ref 0–100)

## 2015-09-21 LAB — BASIC METABOLIC PANEL
Anion gap: 8 (ref 5–15)
BUN: 23 mg/dL — ABNORMAL HIGH (ref 6–20)
CO2: 21 mmol/L — ABNORMAL LOW (ref 22–32)
Calcium: 8.6 mg/dL — ABNORMAL LOW (ref 8.9–10.3)
Chloride: 109 mmol/L (ref 101–111)
Creatinine, Ser: 1.55 mg/dL — ABNORMAL HIGH (ref 0.61–1.24)
GFR calc Af Amer: 50 mL/min — ABNORMAL LOW (ref >60)
GFR calc non Af Amer: 43 mL/min — ABNORMAL LOW (ref >60)
Glucose, Bld: 265 mg/dL — ABNORMAL HIGH (ref 65–99)
Potassium: 3.2 mmol/L — ABNORMAL LOW (ref 3.5–5.1)
Sodium: 138 mmol/L (ref 135–145)

## 2015-09-21 LAB — GLUCOSE, CAPILLARY
Glucose-Capillary: 279 mg/dL — ABNORMAL HIGH (ref 65–99)
Glucose-Capillary: 210 mg/dL — ABNORMAL HIGH (ref 65–99)
Glucose-Capillary: 287 mg/dL — ABNORMAL HIGH (ref 65–99)

## 2015-09-21 LAB — CBC
HCT: 36.2 % — ABNORMAL LOW (ref 39.0–52.0)
Hemoglobin: 12.5 g/dL — ABNORMAL LOW (ref 13.0–17.0)
MCH: 29.8 pg (ref 26.0–34.0)
MCHC: 34.5 g/dL (ref 30.0–36.0)
MCV: 86.2 fL (ref 78.0–100.0)
Platelets: 137 10*3/uL — ABNORMAL LOW (ref 150–400)
RBC: 4.2 MIL/uL — ABNORMAL LOW (ref 4.22–5.81)
RDW: 14.6 % (ref 11.5–15.5)
WBC: 7.9 10*3/uL (ref 4.0–10.5)

## 2015-09-21 LAB — PROTIME-INR
INR: 1.49 (ref 0.00–1.49)
Prothrombin Time: 18.1 seconds — ABNORMAL HIGH (ref 11.6–15.2)

## 2015-09-21 LAB — MAGNESIUM: Magnesium: 1.5 mg/dL — ABNORMAL LOW (ref 1.7–2.4)

## 2015-09-21 MED ORDER — ACD FORMULA A 0.73-2.45-2.2 GM/100ML VI SOLN
500.0000 mL | Status: DC
  Administered 2015-09-21: 500 mL via INTRAVENOUS

## 2015-09-21 MED ORDER — CALCIUM GLUCONATE 10 % IV SOLN
4.0000 g | Freq: Once | INTRAVENOUS | Status: AC
  Administered 2015-09-21: 4 g via INTRAVENOUS
  Filled 2015-09-21: qty 40

## 2015-09-21 MED ORDER — INSULIN ASPART 100 UNIT/ML ~~LOC~~ SOLN
0.0000 [IU] | Freq: Three times a day (TID) | SUBCUTANEOUS | Status: DC
  Administered 2015-09-21: 8 [IU] via SUBCUTANEOUS
  Administered 2015-09-21 – 2015-09-22 (×2): 5 [IU] via SUBCUTANEOUS
  Administered 2015-09-22: 11 [IU] via SUBCUTANEOUS
  Administered 2015-09-23: 15 [IU] via SUBCUTANEOUS
  Administered 2015-09-23 – 2015-09-24 (×2): 5 [IU] via SUBCUTANEOUS
  Administered 2015-09-24: 15 [IU] via SUBCUTANEOUS
  Administered 2015-09-25: 8 [IU] via SUBCUTANEOUS
  Administered 2015-09-25: 5 [IU] via SUBCUTANEOUS
  Administered 2015-09-25: 15 [IU] via SUBCUTANEOUS
  Administered 2015-09-26 (×2): 3 [IU] via SUBCUTANEOUS

## 2015-09-21 MED ORDER — INSULIN ASPART 100 UNIT/ML ~~LOC~~ SOLN
0.0000 [IU] | Freq: Every day | SUBCUTANEOUS | Status: DC
  Administered 2015-09-21: 3 [IU] via SUBCUTANEOUS
  Administered 2015-09-22: 4 [IU] via SUBCUTANEOUS
  Administered 2015-09-23: 3 [IU] via SUBCUTANEOUS
  Administered 2015-09-25: 2 [IU] via SUBCUTANEOUS

## 2015-09-21 MED ORDER — INSULIN ASPART 100 UNIT/ML ~~LOC~~ SOLN
5.0000 [IU] | Freq: Three times a day (TID) | SUBCUTANEOUS | Status: DC
  Administered 2015-09-21 – 2015-09-22 (×4): 5 [IU] via SUBCUTANEOUS

## 2015-09-21 MED ORDER — POTASSIUM CHLORIDE CRYS ER 20 MEQ PO TBCR: 20.0000 meq | EXTENDED_RELEASE_TABLET | Freq: Every day | ORAL | Status: DC

## 2015-09-21 MED ORDER — POLYETHYLENE GLYCOL 3350 17 G PO PACK
17.0000 g | PACK | Freq: Every day | ORAL | Status: DC
  Administered 2015-09-21 – 2015-09-24 (×4): 17 g via ORAL
  Filled 2015-09-21 (×4): qty 1

## 2015-09-21 MED ORDER — SODIUM CHLORIDE 0.9 % IV SOLN
INTRAVENOUS | Status: AC
  Administered 2015-09-21 (×3): via INTRAVENOUS_CENTRAL
  Filled 2015-09-21 (×3): qty 200

## 2015-09-21 MED ORDER — SODIUM CHLORIDE 0.9 % IV SOLN
INTRAVENOUS | Status: DC
  Filled 2015-09-21 (×3): qty 200

## 2015-09-21 MED ORDER — ACETAMINOPHEN 325 MG PO TABS
ORAL_TABLET | ORAL | Status: AC
  Filled 2015-09-21: qty 2

## 2015-09-21 MED ORDER — ACD FORMULA A 0.73-2.45-2.2 GM/100ML VI SOLN
Status: AC
  Administered 2015-09-21: 500 mL
  Filled 2015-09-21: qty 1000

## 2015-09-21 MED ORDER — CALCIUM CARBONATE ANTACID 500 MG PO CHEW: 2.0000 | CHEWABLE_TABLET | ORAL | Status: DC

## 2015-09-21 MED ORDER — INSULIN GLARGINE 100 UNIT/ML ~~LOC~~ SOLN
32.0000 [IU] | Freq: Every day | SUBCUTANEOUS | Status: DC
Start: 2015-09-21 — End: 2015-09-22
  Administered 2015-09-21: 32 [IU] via SUBCUTANEOUS
  Filled 2015-09-21 (×2): qty 0.32

## 2015-09-21 MED ORDER — TRAZODONE HCL 50 MG PO TABS
50.0000 mg | ORAL_TABLET | Freq: Every evening | ORAL | Status: DC | PRN
  Administered 2015-09-21: 50 mg via ORAL
  Filled 2015-09-21: qty 1

## 2015-09-21 MED ORDER — MAGNESIUM SULFATE 2 GM/50ML IV SOLN
2.0000 g | Freq: Once | INTRAVENOUS | Status: AC
  Administered 2015-09-21: 2 g via INTRAVENOUS
  Filled 2015-09-21: qty 50

## 2015-09-21 MED ORDER — OXYCODONE HCL 5 MG PO TABS
5.0000 mg | ORAL_TABLET | ORAL | Status: DC | PRN
  Administered 2015-09-21 – 2015-09-23 (×4): 5 mg via ORAL
  Filled 2015-09-21 (×4): qty 1

## 2015-09-21 MED ORDER — TRAMADOL HCL 50 MG PO TABS
50.0000 mg | ORAL_TABLET | Freq: Two times a day (BID) | ORAL | Status: DC | PRN
  Administered 2015-09-21: 50 mg via ORAL
  Filled 2015-09-21: qty 1

## 2015-09-21 MED ORDER — ACETAMINOPHEN 325 MG PO TABS
650.0000 mg | ORAL_TABLET | Freq: Three times a day (TID) | ORAL | Status: DC
  Administered 2015-09-21 – 2015-09-26 (×15): 650 mg via ORAL
  Filled 2015-09-21 (×15): qty 2

## 2015-09-21 MED ORDER — HEPARIN SODIUM (PORCINE) 1000 UNIT/ML IJ SOLN: 1000.0000 [IU] | Freq: Once | INTRAMUSCULAR | Status: DC

## 2015-09-21 MED ORDER — POTASSIUM CHLORIDE CRYS ER 10 MEQ PO TBCR
10.0000 meq | EXTENDED_RELEASE_TABLET | Freq: Every day | ORAL | Status: DC
  Administered 2015-09-22 – 2015-09-26 (×5): 10 meq via ORAL
  Filled 2015-09-21 (×5): qty 1

## 2015-09-21 MED ORDER — ACETAMINOPHEN 325 MG PO TABS
650.0000 mg | ORAL_TABLET | ORAL | Status: DC | PRN
  Administered 2015-09-21: 650 mg via ORAL

## 2015-09-21 MED ORDER — DIPHENHYDRAMINE HCL 25 MG PO CAPS: 25.0000 mg | ORAL_CAPSULE | Freq: Four times a day (QID) | ORAL | Status: DC | PRN

## 2015-09-21 MED ORDER — POTASSIUM CHLORIDE 20 MEQ/15ML (10%) PO SOLN
40.0000 meq | Freq: Once | ORAL | Status: AC
  Administered 2015-09-21: 40 meq via ORAL
  Filled 2015-09-21: qty 30

## 2015-09-21 NOTE — Clinical Social Work Note (Signed)
Clinical Social Work Assessment  Patient Details  Name: Tanner Mason MRN: [id number] Date of Birth: 04-10-1941  Date of referral:  09/21/15               Reason for consult:  Facility Placement                Permission sought to share information with:  Facility Sport and exercise psychologist, Family Supports Permission granted to share information::  Yes, Verbal Permission Granted  Name::     Marlowe Kays  Agency::  [facility] SNFs  Relationship::  Spouse  Contact Information:  [phone]  Housing/Transportation Living arrangements for the past 2 months:  [place] of Information:  Patient Patient Interpreter Needed:  None Criminal Activity/Legal Involvement Pertinent to Current Situation/Hospitalization:  No - Comment as needed Significant Relationships:  Spouse, Adult Children Lives with:  Spouse Do you feel safe going back to the place where you live?  No Need for family participation in patient care:  Yes (Comment)  Care giving concerns:  CSW received referral for possible SNF placement at time of discharge if CIR is unable to admit. CSW met with patient and patient's son-in-law at bedside regarding PT recommendation of SNF placement at time of discharge. Patient reported that patient's wife is currently unable to care for patient at their home given patient's current physical needs and fall risk. Patient expressed understanding of PT recommendation and are agreeable to SNF placement at time of discharge. CSW to continue to follow and assist with discharge planning needs.   Social Worker assessment / plan:  CSW spoke with patient concerning possibility of rehab at [facility] before returning home.  Employment status:  Retired Nurse, adult PT Recommendations:  Inpatient [place], [place] / Referral to community resources:  [place]  Patient/Family's Response to care:  Patient recognizse need  for rehab before returning home and is agreeable to a SNF in [place] if CIR is unable to admit him. Patient has never been to a SNF before except to visit and would like CSW to ask his wife about facilities.   Patient/Family's Understanding of and Emotional Response to Diagnosis, Current Treatment, and Prognosis:  Patient is realistic regarding therapy needs. No questions/concerns about plan or treatment.    Emotional Assessment Appearance:  Appears stated age Attitude/Demeanor/Rapport:  Other (Appropriate) Affect (typically observed):  Accepting, Appropriate Orientation:  Oriented to  Time, Oriented to Situation, Oriented to Place, Oriented to Self Alcohol / Substance use:  Not Applicable Psych involvement (Current and /or in the community):  No (Comment)  Discharge Needs  Concerns to be addressed:  Care Coordination Readmission within the last 30 days:  No Current discharge risk:  None Barriers to Discharge:  Continued Medical Work up   [employer], [place] 09/21/2015, 12:55 PM

## 2015-09-21 NOTE — Progress Notes (Signed)
3 beat run of V-tach noted, MD aware. Will continue to monitor. Wife at the bedside, call bell within reach. Asymptomatic at this time.

## 2015-09-21 NOTE — Progress Notes (Signed)
Inpatient Diabetes Program Recommendations  AACE/ADA: New Consensus Statement on Inpatient Glycemic Control (2015)  Target Ranges:  Prepandial:   less than 140 mg/dL      Peak postprandial:   less than 180 mg/dL (1-2 hours)      Critically ill patients:  140 - 180 mg/dL   Review of Glycemic Control  Diabetes history: DM 2 Outpatient Diabetes medications: Lantus 50 units Daily, Humalog 10 units breakfast, 14 units lunch, 14 units Supper Current orders for Inpatient glycemic control: Lantus 32 units Daily, Novolog Moderate + HS scale + Novolog 5 units TID meal coverage  Inpatient Diabetes Program Recommendations: Insulin - Basal: Fasting glucose elevated at 272 mg/dl this am after 32 units of basal insulin given. May want to consider increasing Lantus to 38 units.  Thanks,  Christena DeemShannon Uchechi [place] RN, MSN, [facility] Inpatient Diabetes Coordinator Team Pager [phone] (8a-5p)

## 2015-09-21 NOTE — Progress Notes (Signed)
Family Medicine Teaching Service Daily Progress Note Intern Pager: [phone]  Patient name: Tanner Mason Medical record number: [id number] Date of birth: 03/15/1941 Age: 75 y.o. Gender: male  Primary Care Provider: Lupita Dawn, MD Consultants: Neurosurgery Code Status: Full  Pt Overview and Major Events to Date:  5/21: Admitted to FMTS with weakness and gait abnormality 5/23: Plasmapheresis Treatment #1 5/24: Plasmapheresis Treatment #2  Assessment and Plan: Tanner Mason is a 75 y.o. male presenting with weakness/ gait abnormality, hypokalemia . PMH is significant for lumbar radiculopathy affecting LLE, HTN, HLD, DM2 w/ peripheral neuropathy, CKD3, GERD  # Weakness/Gait abnormality 2/2 Guillain Barre: Still having numbness in his feet bilaterally. B12 471, Folate 1789, TSH 1.323, HIV wnl, RPR wnl, ANA neg. CSF Protein 135, Glucose 148, WBC 1. - Continue telemetry for increased risk for arrhythmias  - PLEX daily x 3 days (5/23-5/25), then every other day for a total of 5 treatments. - Watch INR on plasmapheresis. INR 1.49 this morning. - Xanax 0.23m prn for anxiety during plasmapheresis. - NIFs BID. NIF -44 last night. Will continue to monitor q12 hours. - Neurology following, appreciate recommendations. - Neuro checks q4hrs - PT/OT recommending CIR   # DM2 w/ peripheral neuropathy:  CBGs [id number] Last A1c 6.4 (06/2015). On Lantus 50u qd, humalog 02/09/13, and Neurontin 6052mTID at home. Has required 24 units of Novolog in the last 24 hours. - Increased Lantus to 32 units daily yesterday. Will start Novolog 5 units tid. - Will increase SSI from sensitive to moderate - CBG monitoring QAC and HS - Neurontin at 60057mID    # Hypokalemia: K 3.4 today. Mag 1.7 on 5/21. - Will repeat Mag - Continue daily K-Dur 10 mEq daily. Given KCl 40 mEq x 1 today. - Daily BMETs  # CKD3: Cr 1.85 > 1.50 (baseline 1.5-1.8). Appears euvolemic. - Avoid nephrotoxic agents - Daily  BMETs  # HTN: BP 115/83 this morning. On Norvasc 52m37mopressor 100mg12md HCTZ 12.5mg -75mlding HCTZ  - Continue Lopressor and Norvasc - Monitor BPs  # GERD: on Omeprazole outpatient - Protonix 20mg w28m hospitalized  # BPH: on Flomax and Proscar outpatient - Continue home meds  FEN/GI: Heart healthy, carb-mod diet; Protonix Prophylaxis: SCDs until seen by neurosx  Disposition: Telemetry   Subjective:  Pt states he is tolerating his treatments well. He feels like the numbness in his hand is improving. He is having a very difficult time sleeping because his back is hurting from sleeping on the hospital bed. No other concerns this morning.  Objective: Temp:  [97.4 F (36.3 C)-98.1 F (36.7 C)] 97.6 F (36.4 C) (05/25 0845) Pulse Rate:  [67-82] 78 (05/25 0845) Resp:  [10-20] 14 (05/25 0845) BP: (88-168)/(54-112) 136/69 mmHg (05/25 0845) SpO2:  [100 %] 100 % (05/25 0803) Weight:  [155 lb 3.3 oz (70.4 kg)-161 lb 1.6 oz (73.074 kg)] 155 lb 3.3 oz (70.4 kg) (05/25 0803) Physical Exam: General: Tired-appearing male, receiving plasmapheresis treatment, in NAD Cardiovascular: RRR, no murmurs Respiratory: CTAB, normal WOB  Abdomen: +BS, soft, NT/ND Neuro:  5/5 muscle strength in the upper and lower extremities bilaterally, numbness in feet bilaterally, has no proprioception  Psych: Appropriate affect, normal behavior  Laboratory:  Recent Labs Lab 09/19/15 1136  09/20/15 1557 09/21/15 0629 09/21/15 0756  WBC 9.4  --  7.1 7.9  --   HGB 12.5*  < > 11.4* 12.5* 11.6*  HCT 36.1*  < > 33.8* 36.2* 34.0*  PLT 134*  --  138* 137*  --   < > = values in this interval not displayed.  Recent Labs Lab 09/17/15 1158  09/19/15 1136  09/20/15 1557 09/21/15 0629 09/21/15 0756  NA 137  < > 134*  < > 134* 138 141  K 2.9*  < > 4.0  < > 3.1* 3.2* 3.4*  CL 100*  < > 104  < > 103 109 105  CO2 27  < > 19*  --  21* 21*  --   BUN 27*  < > 24*  < > 29* 23* 23*  CREATININE 1.85*  < > 1.76*   < > 1.89* 1.55* 1.50*  CALCIUM 9.0  < > 8.5*  --  8.9 8.6*  --   PROT 7.1  --   --   --  5.9*  --   --   BILITOT 1.0  --   --   --  1.1  --   --   ALKPHOS 75  --   --   --  44  --   --   ALT 29  --   --   --  21  --   --   AST 21  --   --   --  25  --   --   GLUCOSE 201*  < > 419*  < > 354* 265* 272*  < > = values in this interval not displayed. Mag 1.7 TSH 1.323 Vitamin B12 471 PT 15.0, INR 1.16 UA: no bacteria, trace Hgb, neg ketones, neg leukocytes, no WBCs, no RBCs CRP 0.6 and ESR 25  No results found.  Sela Hua, MD 09/21/2015, 9:02 AM PGY-1, [place] Intern pager: [phone], text pages welcome

## 2015-09-21 NOTE — Clinical Social Work Placement (Signed)
   CLINICAL SOCIAL WORK PLACEMENT  NOTE  Date:  09/21/2015  Patient Details  Name: Tanner Mason MRN: [id number] Date of Birth: [birth year]  Clinical Social Work is seeking post-discharge placement for this patient at the Skilled  Nursing Facility level of care (*CSW will initial, date and re-position this form in  chart as items are completed):      Patient/family provided with [facility] Clinical Social Work Department's list of facilities offering this level of care within the geographic area requested by the patient (or if unable, by the patient's family).      Patient/family informed of their freedom to choose among providers that offer the needed level of care, that participate in Medicare, Medicaid or managed care program needed by the patient, have an available bed and are willing to accept the patient.      Patient/family informed of [facility]'s ownership interest in [facility] and [facility], as well as of the fact that they are under no obligation to receive care at these facilities.  PASRR submitted to EDS on 09/21/15     PASRR number received on 09/21/15     Existing PASRR number confirmed on       FL2 transmitted to all facilities in geographic area requested by pt/family on 09/21/15     FL2 transmitted to all facilities within larger geographic area on       Patient informed that his/her managed care company has contracts with or will negotiate with certain facilities, including the following:            Patient/family informed of bed offers received.  Patient chooses bed at       Physician recommends and patient chooses bed at      Patient to be transferred to   on  .  Patient to be transferred to facility by       Patient family notified on   of transfer.  Name of family member notified:        PHYSICIAN Please sign FL2     Additional Comment:    _______________________________________________ Mearl LatinNadia S Maurina Fawaz, LCSWA 09/21/2015, 1:07  PM

## 2015-09-21 NOTE — Progress Notes (Signed)
NIF=-44 VC=1.8 Average of three attempts with great effort

## 2015-09-21 NOTE — NC FL2 (Signed)
[place] MEDICAID FL2 LEVEL OF CARE SCREENING TOOL     IDENTIFICATION  Patient Name: Tanner Mason Birthdate: [birth year] Sex: male Admission Date (Current Location): 09/17/2015  [facility] and [place] Number:  Producer, television/film/video and Address:  [facility], [address] [place], [place] [address]      Provider Number: [id number]  Attending Physician Name and Address:  Uvaldo Rising, MD  Relative Name and Phone Number:  [name], wife, [phone]    Current Level of Care: Hospital Recommended Level of Care: Skilled Nursing Facility Prior Approval Number:    Date Approved/Denied:   PASRR Number:   [id number]   Discharge Plan: SNF    Current Diagnoses: Patient Active Problem List   Diagnosis Date Noted  . Type 2 diabetes mellitus with diabetic neuropathy, unspecified (HCC)   . Guillain Barr syndrome (HCC)   . Weakness 09/17/2015  . Gait abnormality 09/17/2015  . Gait instability 09/17/2015  . Cellulitis and abscess 07/14/2015  . Preventative health care 11/08/2014  . Angio-edema 07/14/2014  . Angioedema 07/13/2014  . Left foot drop 04/05/2014  . Lumbar back pain with radiculopathy affecting left lower extremity 04/05/2014  . Atypical chest pain 12/14/2013  . Hypokalemia 03/16/2013  . Overweight (BMI 25.0-29.9) 09/15/2012  . Stage 3 chronic renal impairment associated with type 2 diabetes mellitus (HCC) 07/20/2010  . ULNAR NERVE ENTRAPMENT, RIGHT 06/07/2008  . Diabetes mellitus (HCC) 12/09/2007  . Essential hypertension, benign 12/08/2007  . DIASTASIS RECTI 12/08/2007  . HYPERLIPIDEMIA 06/26/2006  . ANEMIA, OTHER, UNSPECIFIED 06/26/2006  . Peripheral autonomic neuropathy due to diabetes mellitus (HCC) 06/26/2006  . GASTROESOPHAGEAL REFLUX DISEASE, CHRONIC 06/26/2006  . Enlarged prostate with lower urinary tract symptoms (LUTS) 06/26/2006    Orientation RESPIRATION BLADDER Height & Weight     Self, Time, Situation, Place  Normal Continent Weight: 70.8 kg (156 lb 1.4 oz) Height:   (170.2 cm)  BEHAVIORAL SYMPTOMS/MOOD NEUROLOGICAL BOWEL NUTRITION STATUS      Continent Diet  AMBULATORY STATUS COMMUNICATION OF NEEDS Skin   Limited Assist Verbally Normal                       Personal Care Assistance Level of Assistance  Bathing, Feeding, Dressing Bathing Assistance: Maximum assistance Feeding assistance: Independent Dressing Assistance: Limited assistance     Functional Limitations Info             SPECIAL CARE FACTORS FREQUENCY  PT (By licensed PT)     PT Frequency: min 4x/week              Contractures      Additional Factors Info  Code Status, Allergies, Insulin Sliding Scale Code Status Info: Full Allergies Info: Ace Inhibitors, Calcium Carbonate Antacid   Insulin Sliding Scale Info: insulin aspart (novoLOG) injection 0-15 Units;insulin aspart (novoLOG) injection 5 Units;insulin glargine (LANTUS) injection 32 Units       Current Medications (09/21/2015):  This is the current hospital active medication list Current Facility-Administered Medications  Medication Dose Route Frequency Provider Last Rate Last Dose  . acetaminophen (TYLENOL) tablet 650 mg  650 mg Oral Q6H PRN Raliegh Ip, DO   650 mg at 09/20/15 1858  . ALPRAZolam Prudy Feeler) tablet 0.25 mg  0.25 mg Oral QHS PRN Nestor Ramp, MD   0.25 mg at 09/21/15 4132  . amLODipine (NORVASC) tablet 10 mg  10 mg Oral Daily Raliegh Ip, DO   10 mg at 09/21/15  1055  . aspirin chewable tablet 81 mg  81 mg Oral QHS Ashly Hulen SkainsM Gottschalk, DO   81 mg at 09/20/15 2116  . atorvastatin (LIPITOR) tablet 40 mg  40 mg Oral q1800 Raliegh IpAshly M Gottschalk, DO   40 mg at 09/20/15 1858  . finasteride (PROSCAR) tablet 5 mg  5 mg Oral QHS Ashly M Gottschalk, DO   5 mg at 09/20/15 2115  . gabapentin (NEURONTIN) capsule 600 mg  600 mg Oral BID Ashly M Gottschalk, DO   600 mg at 09/21/15 1056  . insulin aspart (novoLOG) injection 0-15 Units  0-15  Units Subcutaneous TID WC Campbell StallKaty Dodd Mayo, MD   8 Units at 09/21/15 1202  . insulin aspart (novoLOG) injection 0-5 Units  0-5 Units Subcutaneous QHS Campbell StallKaty Dodd Mayo, MD      . insulin aspart (novoLOG) injection 5 Units  5 Units Subcutaneous TID WC Campbell StallKaty Dodd Mayo, MD   5 Units at 09/21/15 1201  . insulin glargine (LANTUS) injection 32 Units  32 Units Subcutaneous Daily Campbell StallKaty Dodd Mayo, MD   32 Units at 09/21/15 1200  . metoprolol (LOPRESSOR) tablet 100 mg  100 mg Oral BID Ashly M Gottschalk, DO   100 mg at 09/21/15 1055  . pantoprazole (PROTONIX) EC tablet 20 mg  20 mg Oral Daily Ashly M Gottschalk, DO   20 mg at 09/21/15 1056  . [START ON 09/22/2015] potassium chloride (K-DUR,KLOR-CON) CR tablet 10 mEq  10 mEq Oral Daily Ashly M Gottschalk, DO      . tamsulosin [facility](FLOMAX) capsule 0.4 mg  0.4 mg Oral Daily Ashly M Gottschalk, DO   0.4 mg at 09/21/15 1055  . traZODone (DESYREL) tablet 50 mg  50 mg Oral QHS PRN Campbell StallKaty Dodd Mayo, MD         Discharge Medications: Please see discharge summary for a list of discharge medications.  Relevant Imaging Results:  Relevant Lab Results:   Additional Information SSN: [id number]  Jordy Verba S GreshamRayyan, [place]

## 2015-09-21 NOTE — Progress Notes (Signed)
NIF -58, VC 2L, excellent effort.

## 2015-09-21 NOTE — Progress Notes (Signed)
Physical Therapy Cancellation Note    09/21/15 1028  PT Visit Information  Last PT Received On 09/21/15  Reason Eval/Treat Not Completed Patient at procedure or test/unavailable; Pt in HD this AM. PT will check on pt later as time allows.   History of Present Illness Tanner Mason is an 75 y.o. male patient had abrupt onset of numbness and tingling in his b/l hands on Saturday in addition to numbness in his feet bilaterally. He has known diabetic neuropathy but this was different. Marland Kitchen. He was evaluated at an outside hospital who imaged his brain and cervical spine. Reports that no acute processes were found and discharged him home. HE has also had a MRI of brain and cervical spine (which I cannot find on the floor) and he was told they were normal. Due to worsening balance issues and weakness [facility]. Three days after the initial start date he now cannot stand on his own due to significant weakness of his legs. He also has worsening numbness in his legs. HE has no reflexes in his Knees and states he usually does.   Erline LevineKellyn Takako Minckler, PTA Pager: [phone]

## 2015-09-21 NOTE — Progress Notes (Signed)
   Subjective: Complaints of severe back pain, has chronic back pain. He is in HD, getting Plex.  He feels some improvement since he started PLEX, the numbness and tingling in his upper extremities, have almost resolved, has persistent tingling in lower extremities. Was able to walk with Assistance during PT yesterday.  NIFs Q12- Have been stable, NIF- -58 today, VC- 2L.  Objective: Vital signs in last 24 hours: Filed Vitals:   09/21/15 0915 09/21/15 0930 09/21/15 0945 09/21/15 1000  BP: 133/74 140/73 143/73 144/77  Pulse: 78 80 78 79  Temp: 97.6 F (36.4 C) 97.6 F (36.4 C) 97.7 F (36.5 C) 97.6 F (36.4 C)  TempSrc: Oral Oral Oral Oral  Resp: 11 10 10 12   Height:      Weight:      SpO2:       GENERAL- alert, co-operative, not in any distress, getting PLEX. HEENT- Atraumatic, normocephalic, PERRL, NEURO- Sensation intact extremities and face, strenght upper and lower extremities- 5/5. Gait not tested. EXTREMITIES- pulse 2+, symmetric, no pedal edema. SKIN- Warm, dry, No rash or lesion. PSYCH- Normal mood and affect, appropriate thought content and speech.  Assessment and Plan-  Impression: 75 yo M with ascending weakness and paresthesias with elevated CSF protein without pleocytosis, all consistent with Guillian-Barr syndrome. He is received his 3/5 treatment of PLEX.   Recommendations: 1) PLEX 3/5 tododay, then every other day. 2) Physical therapy.  3) Cont to follow NIFs, and Vital capacity, also monitor Vitals due to autonomic dysfunction as a complication of GBS. 4) will continue to follow.    LOS: 4 days   Onnie BoerEjiroghene E Darryle Dennie, MD 09/21/2015, 10:18 AM

## 2015-09-22 LAB — BASIC METABOLIC PANEL
Anion gap: 7 (ref 5–15)
BUN: 16 mg/dL (ref 6–20)
CO2: 20 mmol/L — ABNORMAL LOW (ref 22–32)
Calcium: 8.6 mg/dL — ABNORMAL LOW (ref 8.9–10.3)
Chloride: 110 mmol/L (ref 101–111)
Creatinine, Ser: 1.36 mg/dL — ABNORMAL HIGH (ref 0.61–1.24)
GFR calc Af Amer: 58 mL/min — ABNORMAL LOW (ref >60)
GFR calc non Af Amer: 50 mL/min — ABNORMAL LOW (ref >60)
Glucose, Bld: 250 mg/dL — ABNORMAL HIGH (ref 65–99)
Potassium: 3.8 mmol/L (ref 3.5–5.1)
Sodium: 137 mmol/L (ref 135–145)

## 2015-09-22 LAB — CBC
HCT: 32.9 % — ABNORMAL LOW (ref 39.0–52.0)
Hemoglobin: 11.4 g/dL — ABNORMAL LOW (ref 13.0–17.0)
MCH: 30.4 pg (ref 26.0–34.0)
MCHC: 34.7 g/dL (ref 30.0–36.0)
MCV: 87.7 fL (ref 78.0–100.0)
Platelets: 116 10*3/uL — ABNORMAL LOW (ref 150–400)
RBC: 3.75 MIL/uL — ABNORMAL LOW (ref 4.22–5.81)
RDW: 15 % (ref 11.5–15.5)
WBC: 7.1 10*3/uL (ref 4.0–10.5)

## 2015-09-22 LAB — GLUCOSE, CAPILLARY
Glucose-Capillary: 264 mg/dL — ABNORMAL HIGH (ref 65–99)
Glucose-Capillary: 335 mg/dL — ABNORMAL HIGH (ref 65–99)
Glucose-Capillary: 264 mg/dL — ABNORMAL HIGH (ref 65–99)
Glucose-Capillary: 319 mg/dL — ABNORMAL HIGH (ref 65–99)

## 2015-09-22 LAB — MAGNESIUM: Magnesium: 1.9 mg/dL (ref 1.7–2.4)

## 2015-09-22 LAB — PROTIME-INR
INR: 1.41 (ref 0.00–1.49)
Prothrombin Time: 17.3 seconds — ABNORMAL HIGH (ref 11.6–15.2)

## 2015-09-22 MED ORDER — INSULIN GLARGINE 100 UNIT/ML ~~LOC~~ SOLN
38.0000 [IU] | Freq: Every day | SUBCUTANEOUS | Status: DC
  Administered 2015-09-22 – 2015-09-24 (×3): 38 [IU] via SUBCUTANEOUS
  Filled 2015-09-22 (×3): qty 0.38

## 2015-09-22 NOTE — Care Management Important Message (Signed)
Important Message  Patient Details  Name: Tanner Mason MRN: [id number] Date of Birth: 10-27-1940   Medicare Important Message Given:  Yes    Kyla BalzarineShealy, Vici Novick Abena 09/22/2015, 1:26 PM

## 2015-09-22 NOTE — Progress Notes (Signed)
MD just called to make sure patient was getting the respiratory treatments Q12 as ordered, I told her I would contact respiratory.  Called Tim, and spoke to him. He is on the list. Dorothe PeaKaren Mossie Gilder, RN

## 2015-09-22 NOTE — Progress Notes (Signed)
Physical Therapy Treatment Patient Details Name: Tanner Mason MRN: [id number] DOB: [birth year] Today's Date: 09/22/2015    History of Present Illness Tanner Mason is an 75 y.o. male patient had abrupt onset of numbness and tingling in his b/l hands on Saturday in addition to numbness in his feet bilaterally. He has known diabetic neuropathy but this was different. Marland Kitchen He was evaluated at an outside hospital who imaged his brain and cervical spine. Reports that no acute processes were found and discharged him home. HE has also had a MRI of brain and cervical spine (which I cannot find on the floor) and he was told they were normal. Due to worsening balance issues and weakness he came to ED at [facility]. Three days after the initial start date he now cannot stand on his own due to significant weakness of his legs. He also has worsening numbness in his legs. HE has no reflexes in his Knees and states he usually does.     PT Comments    Patient is making gradual progress toward mobility goals with increase in activity tolerance this session. Multiple rest breaks needed during ambulation and pt continues to demonstrate ataxic gait and greater weakness L LE vs R LE. Current plan remains appropriate.   Follow Up Recommendations  CIR     Equipment Recommendations  Other (comment)    Recommendations for Other Services Rehab consult     Precautions / Restrictions Precautions Precautions: Fall Restrictions Weight Bearing Restrictions: No    Mobility  Bed Mobility               General bed mobility comments: OOB in chair upon arrival  Transfers Overall transfer level: Needs assistance Equipment used: Rolling walker (2 wheeled) Transfers: Sit to/from Stand Sit to Stand: Min assist;+2 safety/equipment         General transfer comment: assist to gain balance upon standing; pt used momentum to stand; cues for hand placment to keep head up and forward gaze    Ambulation/Gait Ambulation/Gait assistance: Mod assist;+2 safety/equipment Ambulation Distance (Feet): 50 Feet (3 seated rest breaks) Assistive device: Rolling walker (2 wheeled) Gait Pattern/deviations: Ataxic     General Gait Details: 3 seated rest breaks required due to fatigue; pt scissors more with fatigue; Pt continues to have difficulty with forward advancement of bilat LE and with greater weakness L LE vs R LE; attempted use of visual cues for foot placement with some improvement noted until fatigue   Stairs            Wheelchair Mobility    Modified Rankin (Stroke Patients Only)       Balance Overall balance assessment: Needs assistance Sitting-balance support: Feet supported Sitting balance-Leahy Scale: Good     Standing balance support: Bilateral upper extremity supported Standing balance-Leahy Scale: Poor                      Cognition Arousal/Alertness: Awake/alert Behavior During Therapy: WFL for tasks assessed/performed Overall Cognitive Status: Within Functional Limits for tasks assessed                      Exercises General Exercises - Lower Extremity Ankle Circles/Pumps: Strengthening;Both;15 reps Heel Slides: Strengthening;Both;5 reps;Seated Hip ABduction/ADduction: Strengthening;Both;5 reps;Seated Straight Leg Raises: Strengthening;Both;5 reps;Seated Hip Flexion/Marching: Strengthening;Both;10 reps;Seated    General Comments        Pertinent Vitals/Pain      Home Living  Prior Function            PT Goals (current goals can now be found in the care plan section) Acute Rehab PT Goals Patient Stated Goal: be able to walk again Progress towards PT goals: Progressing toward goals    Frequency  Min 4X/week    PT Plan Current plan remains appropriate    Co-evaluation             End of Session Equipment Utilized During Treatment: Gait belt Activity Tolerance: Patient  tolerated treatment well Patient left: with call bell/phone within reach;in chair;with chair alarm set     Time: 0911-0940 PT Time Calculation (min) (ACUTE ONLY): 29 min  Charges:  $Gait Training: 8-22 mins $Therapeutic Exercise: 8-22 mins                    G Codes:      Derek MoundKellyn R Jaritza Duignan Gilad Dugger, PTA Pager: [phone]   09/22/2015, 11:24 AM

## 2015-09-22 NOTE — Progress Notes (Signed)
NIF >-60, VC 2.1

## 2015-09-22 NOTE — Progress Notes (Signed)
Average of three attempts: NIF=-50 VC=1.8L Great patient effort

## 2015-09-22 NOTE — Progress Notes (Signed)
CSW provided pt and pt wife with SNF bed offers- they will review over the weekend and have choice ready Monday if pt is ready for DC at that time.  Still maintain that CIR is first choice but remains agreeable to SNF if CIR not an option  CSW will continue to follow  Merlyn LotJenna Holoman, [facility] Pleasant Valley HospitalCSWA Clinical Social Worker [phone]

## 2015-09-22 NOTE — Care Management Note (Addendum)
Case Management Note  Patient Details  Name: Dorthula RueCharles A Bouler MRN: [id number] Date of Birth: December 14, 1940  Subjective/Objective:                 Spoke with patient in the room. He is admitted with GB, receiving plasmaphoresis. Anticipate DC to CIR or SNF, patient has been consulted by CSW for SNF placement. Anticipate patient to receive treatments throughout weekend.    Action/Plan:  Anticipate DC to CIR or SNF early next week.   Expected Discharge Date:                  Expected Discharge Plan:  IP Rehab Facility  In-House Referral:  Clinical Social Work  Discharge planning Services  CM Consult  Post Acute Care Choice:    Choice offered to:     DME Arranged:    DME Agency:     HH Arranged:    HH Agency:     Status of Service:  In process, will continue to follow  Medicare Important Message Given:  Yes Date Medicare IM Given:    Medicare IM give by:    Date Additional Medicare IM Given:    Additional Medicare Important Message give by:     If discussed at Long Length of Stay Meetings, dates discussed:    Additional Comments:  Lawerance SabalDebbie Tymon Nemetz, RN 09/22/2015, 4:35 PM

## 2015-09-22 NOTE — Progress Notes (Signed)
Occupational Therapy Treatment Patient Details Name: Tanner Mason MRN: [id number] DOB: 03/27/1941 Today's Date: 09/22/2015    History of present illness [name] is an 75 y.o. male patient had abrupt onset of numbness and tingling in his b/l hands on Saturday in addition to numbness in his feet bilaterally. He has known diabetic neuropathy but this was different. Marland Kitchen He was evaluated at an outside hospital who imaged his brain and cervical spine. Reports that no acute processes were found and discharged him home. HE has also had a MRI of brain and cervical spine (which I cannot find on the floor) and he was told they were normal. Due to worsening balance issues and weakness he came to ED at [facility]. Three days after the initial start date he now cannot stand on his own due to significant weakness of his legs. He also has worsening numbness in his legs. HE has no reflexes in his Knees and states he usually does.    OT comments  Focus of session on UB exercises using level 3 theraband and medium soft theraputty. Pt demonstrated independence in exercises and directed pt to perform 3 times a day over the weekend. Pt is not new to exercise, he and his wife go the the [facility] regularly. Pt remains highly motivated and a good inpatient rehab candidate. Will continue to follow.  Follow Up Recommendations  CIR;Supervision/Assistance - 24 hour    Equipment Recommendations  3 in 1 bedside comode    Recommendations for Other Services      Precautions / Restrictions Precautions Precautions: Fall       Mobility Bed Mobility                  Transfers                      Balance     Sitting balance-Leahy Scale: Good                             ADL                                                Vision                     Perception     Praxis      Cognition   Behavior During Therapy: WFL for tasks  assessed/performed Overall Cognitive Status: Within Functional Limits for tasks assessed                       Extremity/Trunk Assessment               Exercises General Exercises - Upper Extremity Shoulder Flexion: Strengthening;Both;10 reps;Seated;Theraband Theraband Level (Shoulder Flexion): Level 3 (Green) Shoulder Horizontal ABduction: Strengthening;Both;10 reps;Seated;Theraband Theraband Level (Shoulder Horizontal Abduction): Level 3 (Green) Elbow Flexion: Strengthening;Both;10 reps;Seated;Theraband Theraband Level (Elbow Flexion): Level 3 (Green) Elbow Extension: Strengthening;Both;10 reps;Seated;Theraband Theraband Level (Elbow Extension): Level 3 (Green)   Medium soft theraputty exercises B hands/wristss   Shoulder Instructions       General Comments      Pertinent Vitals/ Pain       Pain Assessment: No/denies pain  Home Living  Prior Functioning/Environment              Frequency Min 3X/week     Progress Toward Goals  OT Goals(current goals can now be found in the care plan section)  Progress towards OT goals: Progressing toward goals  Acute Rehab OT Goals Patient Stated Goal: be able to walk again Time For Goal Achievement: 10/03/15 Potential to Achieve Goals: Good  Plan Discharge plan remains appropriate    Co-evaluation                 End of Session     Activity Tolerance     Patient Left in chair;with call bell/phone within reach;with family/visitor present   Nurse Communication          Time: [id number] OT Time Calculation (min): 30 min  Charges: OT General Charges $OT Visit: 1 Procedure OT Treatments $Therapeutic Exercise: 23-37 mins  Evern BioMayberry, Maveryck Bahri Lynn 09/22/2015, 3:49 PM  [phone]

## 2015-09-22 NOTE — Progress Notes (Signed)
Family Medicine Teaching Service Daily Progress Note Intern Pager: [phone]  Patient name: Tanner Mason Medical record number: [id number] Date of birth: 01-01-1941 Age: 75 y.o. Gender: male  Primary Care Provider: Lupita Dawn, MD Consultants: Neurosurgery Code Status: Full  Pt Overview and Major Events to Date:  5/21: Admitted to FMTS with weakness and gait abnormality 5/23: Plasmapheresis Treatment #1 5/24: Plasmapheresis Treatment #2  Assessment and Plan: Tanner Mason is a 75 y.o. male presenting with weakness/ gait abnormality, hypokalemia . PMH is significant for lumbar radiculopathy affecting LLE, HTN, HLD, DM2 w/ peripheral neuropathy, CKD3, GERD  # Weakness/Gait abnormality 2/2 Guillain Barre: Still having numbness in his feet bilaterally, but feels like it is improving. Numbness in thighs and hands has completely resolved. - Continue telemetry for increased risk for arrhythmias  - PLEX daily x 3 days (5/23-5/25), then every other day (5/27, 5/29) for a total of 5 treatments. - Watch INR on plasmapheresis. INR 1.41 this morning. - Xanax 0.68m prn for anxiety during plasmapheresis. - NIFs BID. NIF -44 last night and -60 this morning. Will continue to monitor q12 hours. - Neurology following, appreciate recommendations. - Neuro checks q4hrs - PT/OT recommending CIR   # DM2 w/ peripheral neuropathy:  CBGs 210-287. Last A1c 6.4 (06/2015). On Lantus 50u qd, humalog 02/09/13, and Neurontin 6031mTID at home. Has required 26 units of Novolog in the last 24 hours. - Will increase Lantus from 32 units to 38 units. Continue Novolog 5 units tid. - Moderate SSI - CBG monitoring QAC and HS - Neurontin at 60026mID    # Hypokalemia, resolved: K 3.4 > 3.8. Mag 1.7 > 1.9. - Continue daily K-Dur 10 mEq daily.  - Daily BMETs  # CKD3: Cr 1.85 > 1.50 > 1.36 (baseline 1.5-1.8). Appears euvolemic. - Avoid nephrotoxic agents - Daily BMETs  # Low back pain: Chronic problem  but worsened by sleeping in hospital bed. - Encouraged Pt to be out of bed as much as possible during the day - Pain not relieved by Tramadol. Oxycodone 5mg73mhrs prn  # HTN: BP 131/65. On Norvasc 10mg66mpressor 100mg,75m HCTZ 12.5mg - 30mding HCTZ  - Continue Lopressor and Norvasc - Monitor BPs  # GERD: on Omeprazole outpatient - Protonix 20mg wh49mhospitalized  # BPH: on Flomax and Proscar outpatient - Continue home meds  FEN/GI: Heart healthy, carb-mod diet; Protonix Prophylaxis: SCDs until seen by neurosx  Disposition: Plan for discharge to CIR vs SNF after completion of plasmapheresis course.  Subjective:  Pt is doing okay this morning. He is still having significant back pain, worse with lying in bed and better with sitting up in the chair.   Objective: Temp:  [97.6 F (36.4 C)-99 F (37.2 C)] 99 F (37.2 C) (05/25 2114) Pulse Rate:  [71-83] 75 (05/25 2114) Resp:  [10-18] 18 (05/25 2114) BP: (115-153)/(65-83) 131/65 mmHg (05/25 2114) SpO2:  [99 %-100 %] 99 % (05/25 2114) Weight:  [156 lb 1.4 oz (70.8 kg)] 156 lb 1.4 oz (70.8 kg) (05/25 1015) Physical Exam: General: Sitting up in chair, in NAD HEENT: [place]/AT, EOMI, MMM Cardiovascular: RRR, no murmurs Respiratory: CTAB, normal WOB  Abdomen: +BS, soft, NT/ND Neuro:  5/5 muscle strength in the upper and lower extremities bilaterally, numbness in feet bilaterally Psych: Appropriate affect, normal behavior  Laboratory:  Recent Labs Lab 09/20/15 1557 09/21/15 0629 09/21/15 0756 09/22/15 0538  WBC 7.1 7.9  --  7.1  HGB 11.4* 12.5* 11.6* 11.4*  HCT  33.8* 36.2* 34.0* 32.9*  PLT 138* 137*  --  116*    Recent Labs Lab 09/17/15 1158  09/20/15 1557 09/21/15 0629 09/21/15 0756 09/22/15 0538  NA 137  < > 134* 138 141 137  K 2.9*  < > 3.1* 3.2* 3.4* 3.8  CL 100*  < > 103 109 105 110  CO2 27  < > 21* 21*  --  20*  BUN 27*  < > 29* 23* 23* 16  CREATININE 1.85*  < > 1.89* 1.55* 1.50* 1.36*  CALCIUM 9.0  < > 8.9  8.6*  --  8.6*  PROT 7.1  --  5.9*  --   --   --   BILITOT 1.0  --  1.1  --   --   --   ALKPHOS 75  --  44  --   --   --   ALT 29  --  21  --   --   --   AST 21  --  25  --   --   --   GLUCOSE 201*  < > 354* 265* 272* 250*  < > = values in this interval not displayed. Mag 1.7 > 1.9 TSH 1.323 Vitamin B12 471  PT 15.0, INR 1.16 UA: no bacteria, trace Hgb, neg ketones, neg leukocytes, no WBCs, no RBCs CRP 0.6 and ESR 25  No results found.  Sela Hua, MD 09/22/2015, 8:09 AM PGY-1, [place] Intern pager: [phone], text pages welcome

## 2015-09-22 NOTE — Progress Notes (Signed)
Subjective: Now starting to feel sensation in his feet and hands.   Exam: Filed Vitals:   09/21/15 2114 09/22/15 0833  BP: 131/65 148/70  Pulse: 75 68  Temp: 99 F (37.2 C)   Resp: 18    Neuro: MS: awake, alert, interactive CN: PERRL, EOMI Motor: 4/5 weakness at hip flexion, knee extension, 4-/5 dorsiflexion, 4/5 plantarflexion.  Sensory:able to feel bilateral hands and feet but still decreased in feet up to knee.   ZOX:WRUEAVTR:absent in LE   Pertinent Labs/Diagnostics: PLT 116  Felicie MornDavid Smith PA-C [facility] [phone]  Impression: 75 yo M with signs/symptoms consistent with GBS, now improving.    Recommendations: 1) PLEX tomorrow 2) once NIFs consistently greater than -50, can d/c  3) PT 4) will follow.   Ritta SlotMcNeill Trenisha Lafavor, MD [facility] [phone]  If 7pm- 7am, please page neurology on call as listed in AMION. 09/22/2015, 9:31 AM

## 2015-09-22 NOTE — Progress Notes (Signed)
Results for Dorthula RueKAMMEYER, Saul A (MRN [id number]) as of 09/22/2015 15:35  Ref. Range 09/21/2015 11:44 09/21/2015 17:04 09/21/2015 21:12 09/22/2015 08:18 09/22/2015 12:06  Glucose-Capillary Latest Ref Range: 65-99 mg/dL 409279 (H) 811210 (H) 914287 (H) 264 (H) 335 (H)  CBGs continue to be greater than 180 mg/dl.  Lantus 38 units given this am.  Recommend increasing Novolog meal coverage to 10 units TID. (Patient takes Humalog 10 units at breakfast, 14 units at lunch, and 14 units at supper at home.) May need to increase Lantus to 45 units daily if CBGs continue to be elevated. Smith MinceKendra Kayston Jodoin RN BSN CDE

## 2015-09-23 ENCOUNTER — Inpatient Hospital Stay (HOSPITAL_COMMUNITY): Payer: Medicare Other

## 2015-09-23 ENCOUNTER — Other Ambulatory Visit: Payer: Self-pay | Admitting: Family Medicine

## 2015-09-23 DIAGNOSIS — M549 Dorsalgia, unspecified: Secondary | ICD-10-CM | POA: Insufficient documentation

## 2015-09-23 DIAGNOSIS — M545 Low back pain: Secondary | ICD-10-CM

## 2015-09-23 LAB — CBC
HCT: 33.2 % — ABNORMAL LOW (ref 39.0–52.0)
Hemoglobin: 11.5 g/dL — ABNORMAL LOW (ref 13.0–17.0)
MCH: 30.4 pg (ref 26.0–34.0)
MCHC: 34.6 g/dL (ref 30.0–36.0)
MCV: 87.8 fL (ref 78.0–100.0)
Platelets: 121 10*3/uL — ABNORMAL LOW (ref 150–400)
RBC: 3.78 MIL/uL — ABNORMAL LOW (ref 4.22–5.81)
RDW: 14.6 % (ref 11.5–15.5)
WBC: 7.5 10*3/uL (ref 4.0–10.5)

## 2015-09-23 LAB — GLUCOSE, CAPILLARY
Glucose-Capillary: 264 mg/dL — ABNORMAL HIGH (ref 65–99)
Glucose-Capillary: 353 mg/dL — ABNORMAL HIGH (ref 65–99)
Glucose-Capillary: 281 mg/dL — ABNORMAL HIGH (ref 65–99)

## 2015-09-23 LAB — BASIC METABOLIC PANEL
Anion gap: 7 (ref 5–15)
BUN: 21 mg/dL — ABNORMAL HIGH (ref 6–20)
CO2: 24 mmol/L (ref 22–32)
Calcium: 8.8 mg/dL — ABNORMAL LOW (ref 8.9–10.3)
Chloride: 103 mmol/L (ref 101–111)
Creatinine, Ser: 1.46 mg/dL — ABNORMAL HIGH (ref 0.61–1.24)
GFR calc Af Amer: 53 mL/min — ABNORMAL LOW (ref >60)
GFR calc non Af Amer: 46 mL/min — ABNORMAL LOW (ref >60)
Glucose, Bld: 254 mg/dL — ABNORMAL HIGH (ref 65–99)
Potassium: 3.5 mmol/L (ref 3.5–5.1)
Sodium: 134 mmol/L — ABNORMAL LOW (ref 135–145)

## 2015-09-23 LAB — MAGNESIUM: Magnesium: 1.9 mg/dL (ref 1.7–2.4)

## 2015-09-23 LAB — PROTIME-INR
INR: 1.23 (ref 0.00–1.49)
Prothrombin Time: 15.6 seconds — ABNORMAL HIGH (ref 11.6–15.2)

## 2015-09-23 MED ORDER — SODIUM CHLORIDE 0.9 % IV SOLN
4.0000 g | Freq: Once | INTRAVENOUS | Status: AC
  Administered 2015-09-23: 2 g via INTRAVENOUS
  Filled 2015-09-23: qty 40

## 2015-09-23 MED ORDER — ACD FORMULA A 0.73-2.45-2.2 GM/100ML VI SOLN
500.0000 mL | Status: DC
  Administered 2015-09-23: 500 mL via INTRAVENOUS
  Filled 2015-09-23: qty 500

## 2015-09-23 MED ORDER — DIPHENHYDRAMINE HCL 25 MG PO CAPS: 25.0000 mg | ORAL_CAPSULE | Freq: Four times a day (QID) | ORAL | Status: DC | PRN

## 2015-09-23 MED ORDER — INSULIN ASPART 100 UNIT/ML ~~LOC~~ SOLN
10.0000 [IU] | Freq: Three times a day (TID) | SUBCUTANEOUS | Status: DC
  Administered 2015-09-23 – 2015-09-25 (×5): 10 [IU] via SUBCUTANEOUS

## 2015-09-23 MED ORDER — ANTICOAGULANT SODIUM CITRATE 4% (200MG/5ML) IV SOLN
5.0000 mL | Freq: Once | Status: DC
  Administered 2015-09-23: 5 mL
  Filled 2015-09-23: qty 250

## 2015-09-23 MED ORDER — SODIUM CHLORIDE 0.9 % IV SOLN
INTRAVENOUS | Status: AC
  Administered 2015-09-23 (×3): via INTRAVENOUS_CENTRAL
  Filled 2015-09-23 (×3): qty 200

## 2015-09-23 MED ORDER — ACETAMINOPHEN 325 MG PO TABS: 650.0000 mg | ORAL_TABLET | ORAL | Status: DC | PRN

## 2015-09-23 MED ORDER — TRAMADOL HCL 50 MG PO TABS: 50.0000 mg | ORAL_TABLET | Freq: Four times a day (QID) | ORAL | Status: DC | PRN

## 2015-09-23 NOTE — Progress Notes (Signed)
Family Medicine Teaching Service Daily Progress Note Intern Pager: [phone]  Patient name: Tanner Mason Medical record number: [id number] Date of birth: [birth year] Age: 75 y.o. Gender: male  Primary Care Provider: Lupita Dawn, MD Consultants: Neurosurgery Code Status: Full  Pt Overview and Major Events to Date:  5/21: Admitted to FMTS with weakness and gait abnormality 5/23: Plasmapheresis Treatment #1 5/24: Plasmapheresis Treatment #2 5/25: Plasmapheresis Trx #3 5/27: Plasmapheresis Trx #4  Assessment and Plan: Tanner Mason is a 75 y.o. male presenting with weakness/ gait abnormality, hypokalemia . PMH is significant for lumbar radiculopathy affecting LLE, HTN, HLD, DM2 w/ peripheral neuropathy, CKD3, GERD  # Weakness/Gait abnormality 2/2 Guillain Barre: Still having numbness in his feet bilaterally, but feels like it is improving. Numbness in thighs and hands has completely resolved. - Continue telemetry for increased risk for arrhythmias  - PLEX daily x 3 days (5/23-5/25), then every other day (5/27, 5/29) for a total of 5 treatments. - Watch INR on plasmapheresis. INR pending collection this morning. - Xanax 0.58m prn for anxiety during plasmapheresis. - NIFs BID. NIF -50 and -60 in last 24 hours . Will continue to monitor q12 hours for now, but per Neurology if remains consistently below -50 can stop checking. - Neurology following, appreciate recommendations. - Neuro checks q4hrs - PT/OT recommending CIR   # DM2 w/ peripheral neuropathy:  CBGs 250-335/24 hours. Last A1c 6.4 (06/2015). On Lantus 50u qd, humalog 02/09/13, and Neurontin 6039mTID at home. Has required 26 units of Novolog in the last 24 hours.  Did not receive pm dose of Novolog 5 for some reason.  Used 30 units of Novolog and 38 units of Lantus in last 24 hours. - Continue Lantus 38 units for now. - Increase meal time coverage to Novolog 10 units tid. - Moderate SSI - CBG monitoring QAC and HS -  Neurontin at 60074mID    # Hypokalemia, resolved: K 3.4 > 3.8. Mag 1.7 > 1.9.  - Continue daily K-Dur 10 mEq daily.  - Daily BMETs - am labs pending collection  # CKD3: Cr 1.85 > 1.50 > 1.36 (baseline 1.5-1.8). Continues to appear euvolemic. - Avoid nephrotoxic agents - Daily BMETs  # Low back pain: Chronic problem but worsened by sleeping in hospital bed. - Encouraged Pt to be out of bed as much as possible during the day - Pain not relieved by Tramadol. Oxycodone 5mg30mhrs prn  # HTN: BP 140/69.  Norvasc 10mg40mpressor 100mg,64m HCTZ 12.5mg - 79mding HCTZ  - Continue Lopressor and Norvasc - Monitor BPs  # GERD: on Omeprazole outpatient - Protonix 20mg wh72mhospitalized  # BPH: on Flomax and Proscar outpatient - Continue home meds  FEN/GI: Heart healthy, carb-mod diet; Protonix Prophylaxis: SCDs until seen by neurosx  Disposition: Plan for discharge to CIR vs SNF after completion of plasmapheresis course.  Subjective:  Patient reports that he is feeling well this am.  He reports that numbness in left hand has completely resolved.  Continues to have slight numbness in right hand.  Starting to regain sensation in bilateral feet.  He describes a "massaging sensation" in his feet.  Notes that he has been up and ambulating with assistance.  Notes some constipation that is refractory to miralax.  He is asking for additional agent to help him have a BM.  No associated nausea, vomiting, abdominal pain.  Objective: Temp:  [98 F (36.7 C)-98.2 F (36.8 C)] 98 F (36.7 C) (05/27 0517) Pulse  Rate:  [66-79] 66 (05/27 0517) Resp:  [16-18] 16 (05/27 0517) BP: (135-148)/(69-71) 140/69 mmHg (05/27 0517) SpO2:  [96 %-98 %] 98 % (05/27 0517) Physical Exam: General: Sitting up in bed, in NAD HEENT: [place]/AT, EOMI, MMM Cardiovascular: RRR, no murmurs Respiratory: CTAB, normal WOB  Abdomen: +BS, soft, NT/ND Neuro:  5/5 muscle strength in the upper and lower extremities bilaterally,  light touch sensation in tact LE Psych: Appropriate affect, normal behavior, mood stable, speech normal  Laboratory:  Recent Labs Lab 09/20/15 1557 09/21/15 0629 09/21/15 0756 09/22/15 0538  WBC 7.1 7.9  --  7.1  HGB 11.4* 12.5* 11.6* 11.4*  HCT 33.8* 36.2* 34.0* 32.9*  PLT 138* 137*  --  116*    Recent Labs Lab 09/17/15 1158  09/20/15 1557 09/21/15 0629 09/21/15 0756 09/22/15 0538  NA 137  < > 134* 138 141 137  K 2.9*  < > 3.1* 3.2* 3.4* 3.8  CL 100*  < > 103 109 105 110  CO2 27  < > 21* 21*  --  20*  BUN 27*  < > 29* 23* 23* 16  CREATININE 1.85*  < > 1.89* 1.55* 1.50* 1.36*  CALCIUM 9.0  < > 8.9 8.6*  --  8.6*  PROT 7.1  --  5.9*  --   --   --   BILITOT 1.0  --  1.1  --   --   --   ALKPHOS 75  --  44  --   --   --   ALT 29  --  21  --   --   --   AST 21  --  25  --   --   --   GLUCOSE 201*  < > 354* 265* 272* 250*  < > = values in this interval not displayed. Mag 1.7 > 1.9 TSH 1.323 Vitamin B12 471  PT 15.0, INR 1.16 UA: no bacteria, trace Hgb, neg ketones, neg leukocytes, no WBCs, no RBCs CRP 0.6 and ESR 25  No results found.  Janora Norlander, DO 09/23/2015, 5:59 AM PGY-2, [place] Intern pager: [phone], text pages welcome

## 2015-09-23 NOTE — Progress Notes (Signed)
NIF=-44 VC=1.8 L Average of three attempts,great patient effort

## 2015-09-23 NOTE — Progress Notes (Signed)
Inpatient Rehabilitation  I note pt. Is nearing completion of PLEX.Marland Kitchen. Recommending IP Rehab consult at this time.  I have text paged FMTS for orders if they are agreeable.    Weldon PickingSusan Julias Mould PT Inpatient Rehab Admissions Coordinator Cell [phone]

## 2015-09-23 NOTE — Progress Notes (Signed)
Subjective: No changes   Exam: Filed Vitals:   09/23/15 1130 09/23/15 1204  BP: 110/64 106/62  Pulse: 59 59  Temp: 97.4 F (36.3 C)   Resp: 11 19   Neuro: MS: awake, alert, interactive CN: PERRL, EOMI Motor: 5/5 hip flexion, knee extension, 4/5 knee flexion bilaterally,  4-/5 dorsiflexion, 4/5 plantarflexion on left, 4+/5 on right Sensory:able to feel bilateral hands and feet but still decreased in legs up to knee.   ZOX:WRUEAVTR:absent in LE   Recommendations: 1) PLEX today, last tx monday 2) if tonight's NIF > -50, can d/c 3) PT 4) will follow.   Ritta SlotMcNeill Patsey Pitstick, MD [facility] [phone]  If 7pm- 7am, please page neurology on call as listed in AMION. 09/23/2015, 6:58 PM

## 2015-09-23 NOTE — Progress Notes (Signed)
Best Effort with VC and NIF: NIF = -50 VC = 2.93 L

## 2015-09-24 LAB — CBC
HCT: 35.3 % — ABNORMAL LOW (ref 39.0–52.0)
Hemoglobin: 12.1 g/dL — ABNORMAL LOW (ref 13.0–17.0)
MCH: 30.6 pg (ref 26.0–34.0)
MCHC: 34.3 g/dL (ref 30.0–36.0)
MCV: 89.1 fL (ref 78.0–100.0)
Platelets: 125 10*3/uL — ABNORMAL LOW (ref 150–400)
RBC: 3.96 MIL/uL — ABNORMAL LOW (ref 4.22–5.81)
RDW: 14.9 % (ref 11.5–15.5)
WBC: 9.5 10*3/uL (ref 4.0–10.5)

## 2015-09-24 LAB — BASIC METABOLIC PANEL
Anion gap: 7 (ref 5–15)
BUN: 24 mg/dL — ABNORMAL HIGH (ref 6–20)
CO2: 21 mmol/L — ABNORMAL LOW (ref 22–32)
Calcium: 8.8 mg/dL — ABNORMAL LOW (ref 8.9–10.3)
Chloride: 108 mmol/L (ref 101–111)
Creatinine, Ser: 1.61 mg/dL — ABNORMAL HIGH (ref 0.61–1.24)
GFR calc Af Amer: 47 mL/min — ABNORMAL LOW (ref >60)
GFR calc non Af Amer: 41 mL/min — ABNORMAL LOW (ref >60)
Glucose, Bld: 225 mg/dL — ABNORMAL HIGH (ref 65–99)
Potassium: 3.8 mmol/L (ref 3.5–5.1)
Sodium: 136 mmol/L (ref 135–145)

## 2015-09-24 LAB — GLUCOSE, CAPILLARY
Glucose-Capillary: 241 mg/dL — ABNORMAL HIGH (ref 65–99)
Glucose-Capillary: 398 mg/dL — ABNORMAL HIGH (ref 65–99)
Glucose-Capillary: 384 mg/dL — ABNORMAL HIGH (ref 65–99)
Glucose-Capillary: 148 mg/dL — ABNORMAL HIGH (ref 65–99)

## 2015-09-24 MED ORDER — POLYETHYLENE GLYCOL 3350 17 G PO PACK
17.0000 g | PACK | Freq: Two times a day (BID) | ORAL | Status: DC
  Administered 2015-09-24 – 2015-09-25 (×2): 17 g via ORAL
  Filled 2015-09-24 (×4): qty 1

## 2015-09-24 MED ORDER — INSULIN GLARGINE 100 UNIT/ML ~~LOC~~ SOLN
45.0000 [IU] | Freq: Every day | SUBCUTANEOUS | Status: DC
  Administered 2015-09-25: 45 [IU] via SUBCUTANEOUS
  Filled 2015-09-24 (×2): qty 0.45

## 2015-09-24 NOTE — Progress Notes (Signed)
Subjective: May be some mild improvement  Exam: Filed Vitals:   09/24/15 0531 09/24/15 1310  BP: 162/69 102/55  Pulse: 61 65  Temp: 97.9 F (36.6 C) 98 F (36.7 C)  Resp: 16 19   Neuro: MS: awake, alert, interactive CN: PERRL, EOMI Motor: 5/5 hip flexion, knee extension, 4/5 knee flexion bilaterally,  4-/5 dorsiflexion, 4/5 plantarflexion on left, 4+/5 on right Sensory:able to feel bilateral hands and feet but still decreased in legs up to knee.   AOZ:HYQMVHTR:absent in LE   Recommendations: 1) PLEX Tomorrow, then would proceed with rehabilitation placement 2) PT 3) will follow.   Ritta SlotMcNeill Jesaiah Fabiano, MD [facility] [phone]  If 7pm- 7am, please page neurology on call as listed in AMION. 09/24/2015, 3:08 PM

## 2015-09-24 NOTE — Progress Notes (Signed)
Family Medicine Teaching Service Daily Progress Note Intern Pager: [phone]  Patient name: Tanner Mason Medical record number: [id number] Date of birth: 1940/06/15 Age: 75 y.o. Gender: male  Primary Care Provider: Lupita Dawn, MD Consultants: Neurosurgery Code Status: Full  Pt Overview and Major Events to Date:  5/21: Admitted to FMTS with weakness and gait abnormality 5/23: Plasmapheresis Treatment #1 5/24: Plasmapheresis Treatment #2 5/25: Plasmapheresis Treatment #3 5/27: Plasmapheresis Treatment #4  Assessment and Plan: Tanner Mason is a 75 y.o. male presenting with weakness/ gait abnormality, hypokalemia . PMH is significant for lumbar radiculopathy affecting LLE, HTN, HLD, DM2 w/ peripheral neuropathy, CKD3, GERD  # Weakness/Gait abnormality 2/2 Guillain Barre: Numbness in thighs and hands has completely resolved. Numbness in his feet is improving. - Continue telemetry for increased risk for arrhythmias  - PLEX daily x 3 days (5/23-5/25), then every other day (5/27, 5/29) for a total of 5 treatments. - Watch INR on plasmapheresis. INR stable at 1.23. INR pending this morning. - Xanax 0.63m prn for anxiety during plasmapheresis. - NIFs have been stable. Will d/c. - Neurology following, appreciate recommendations. - Neuro checks q4hrs - PT/OT recommending CIR. Waiting for CIR to evaluate.  # DM2 w/ peripheral neuropathy:  CBGs 241-353 in the last 24 hours. Last A1c 6.4 (06/2015). On Lantus 50u qd, humalog 02/09/13, and Neurontin 6037mTID at home. Has required 23 units of Novolog per sliding scale in the last 24 hours.  - Will increase Lantus from 38 units to 45 units daily. - Continue Novolog 10 units tid - Moderate SSI - CBG monitoring QAC and HS - Neurontin at 60085mID    # CKD3: Cr 1.46 > 1.61 (baseline 1.5-1.8).Continues to appear euvolemic. - Avoid nephrotoxic agents - Daily BMETs  # Low back pain: Chronic problem but worsened by sleeping in  hospital bed. Lumbar x-ray (5/27) showed no evidence of lumbar spine fracture, degenerative disc disease with disc height loss at L1-2. - Encouraged Pt to be out of bed as much as possible during the day - K-pad - Oxycodone 5mg56mhrs prn, Tramadol 50mg58mrs prn  # HTN: BP 162/69 this morning.  Norvasc 10mg,48mressor 100mg, 12mHCTZ 12.5mg - R36meck BP after AM dose of BP meds - Holding HCTZ  - Continue Lopressor and Norvasc - Monitor BPs  # GERD: on Omeprazole outpatient - Protonix 20mg whi65mospitalized  # BPH: on Flomax and Proscar outpatient - Continue home meds  # Constipation, resolved: Refractory to Miralax - s/p soap suds enema on 5/27, had 2 BMs  FEN/GI: Heart healthy, carb-mod diet; Protonix Prophylaxis: SCDs  Disposition: Plan for discharge to CIR vs SNF after completion of plasmapheresis course.  Subjective:  Pt states he is doing fine. His back pain is "okay". The pain medications are helping. He is having problems with constipation. He received an enema yesterday and had a small hard stool. He still feels very backed up and would like to try another enema today.  Objective: Temp:  [97.4 F (36.3 C)-98 F (36.7 C)] 97.9 F (36.6 C) (05/28 0531) Pulse Rate:  [57-68] 61 (05/28 0531) Resp:  [11-19] 16 (05/28 0531) BP: (91-162)/(59-75) 162/69 mmHg (05/28 0531) SpO2:  [96 %-100 %] 98 % (05/28 0531) Physical Exam: General: Sitting up in chair, in NAD HEENT: [place]/AT, EOMI, MMM Cardiovascular: RRR, no murmurs Respiratory: CTAB, normal WOB  Abdomen: +BS, soft, NT/ND Neuro:  5/5 muscle strength in the upper and lower extremities bilaterally, light touch sensation in tact  LE Psych: Appropriate affect, normal behavior, mood stable, speech normal  Laboratory:  Recent Labs Lab 09/22/15 0538 09/23/15 0930 09/24/15 0417  WBC 7.1 7.5 9.5  HGB 11.4* 11.5* 12.1*  HCT 32.9* 33.2* 35.3*  PLT 116* 121* 125*    Recent Labs Lab 09/17/15 1158  09/20/15 1557   09/22/15 0538 09/23/15 0930 09/24/15 0417  NA 137  < > 134*  < > 137 134* 136  K 2.9*  < > 3.1*  < > 3.8 3.5 3.8  CL 100*  < > 103  < > 110 103 108  CO2 27  < > 21*  < > 20* 24 21*  BUN 27*  < > 29*  < > 16 21* 24*  CREATININE 1.85*  < > 1.89*  < > 1.36* 1.46* 1.61*  CALCIUM 9.0  < > 8.9  < > 8.6* 8.8* 8.8*  PROT 7.1  --  5.9*  --   --   --   --   BILITOT 1.0  --  1.1  --   --   --   --   ALKPHOS 75  --  44  --   --   --   --   ALT 29  --  21  --   --   --   --   AST 21  --  25  --   --   --   --   GLUCOSE 201*  < > 354*  < > 250* 254* 225*  < > = values in this interval not displayed. Mag 1.7 > 1.9 TSH 1.323 Vitamin B12 471  PT 15.0, INR 1.16 UA: no bacteria, trace Hgb, neg ketones, neg leukocytes, no WBCs, no RBCs CRP 0.6 and ESR 25  Dg Lumbar Spine 2-3 Views  09/23/2015  CLINICAL DATA:  Low back pain for 2 days EXAM: LUMBAR SPINE - 2-3 VIEW COMPARISON:  None. FINDINGS: There is no evidence of lumbar spine fracture. Alignment is normal. Degenerative disc disease with disc height loss at L1-2. Bilateral facet arthropathy at L4-5 and L5-S1. Abdominal aortic atherosclerosis. IMPRESSION: No acute osseous injury of the lumbar spine. Electronically Signed   By: Kathreen Devoid   On: 09/23/2015 16:03    Sela Hua, MD 09/24/2015, 9:43 AM PGY-1, [place] Intern pager: [phone], text pages welcome

## 2015-09-25 ENCOUNTER — Encounter (HOSPITAL_COMMUNITY): Payer: Self-pay | Admitting: Physical Medicine and Rehabilitation

## 2015-09-25 DIAGNOSIS — T783XXA Angioneurotic edema, initial encounter: Secondary | ICD-10-CM

## 2015-09-25 DIAGNOSIS — R29898 Other symptoms and signs involving the musculoskeletal system: Secondary | ICD-10-CM

## 2015-09-25 LAB — CBC
HCT: 35.2 % — ABNORMAL LOW (ref 39.0–52.0)
Hemoglobin: 11.8 g/dL — ABNORMAL LOW (ref 13.0–17.0)
MCH: 30 pg (ref 26.0–34.0)
MCHC: 33.5 g/dL (ref 30.0–36.0)
MCV: 89.6 fL (ref 78.0–100.0)
Platelets: 96 10*3/uL — ABNORMAL LOW (ref 150–400)
RBC: 3.93 MIL/uL — ABNORMAL LOW (ref 4.22–5.81)
RDW: 15.1 % (ref 11.5–15.5)
WBC: 4.6 10*3/uL (ref 4.0–10.5)

## 2015-09-25 LAB — BASIC METABOLIC PANEL
Anion gap: 9 (ref 5–15)
BUN: 22 mg/dL — ABNORMAL HIGH (ref 6–20)
CO2: 23 mmol/L (ref 22–32)
Calcium: 9.2 mg/dL (ref 8.9–10.3)
Chloride: 104 mmol/L (ref 101–111)
Creatinine, Ser: 1.64 mg/dL — ABNORMAL HIGH (ref 0.61–1.24)
GFR calc Af Amer: 46 mL/min — ABNORMAL LOW (ref >60)
GFR calc non Af Amer: 40 mL/min — ABNORMAL LOW (ref >60)
Glucose, Bld: 204 mg/dL — ABNORMAL HIGH (ref 65–99)
Potassium: 3.8 mmol/L (ref 3.5–5.1)
Sodium: 136 mmol/L (ref 135–145)

## 2015-09-25 LAB — GLUCOSE, CAPILLARY
Glucose-Capillary: 213 mg/dL — ABNORMAL HIGH (ref 65–99)
Glucose-Capillary: 414 mg/dL — ABNORMAL HIGH (ref 65–99)
Glucose-Capillary: 255 mg/dL — ABNORMAL HIGH (ref 65–99)
Glucose-Capillary: 250 mg/dL — ABNORMAL HIGH (ref 65–99)

## 2015-09-25 LAB — PROTIME-INR
INR: 1.27 (ref 0.00–1.49)
Prothrombin Time: 16.1 seconds — ABNORMAL HIGH (ref 11.6–15.2)

## 2015-09-25 MED ORDER — METHYLPREDNISOLONE SODIUM SUCC 125 MG IJ SOLR
80.0000 mg | Freq: Once | INTRAMUSCULAR | Status: AC
  Administered 2015-09-25: 80 mg via INTRAVENOUS
  Filled 2015-09-25: qty 2

## 2015-09-25 MED ORDER — DIPHENHYDRAMINE HCL 50 MG/ML IJ SOLN: 50.0000 mg | Freq: Once | INTRAMUSCULAR | Status: DC

## 2015-09-25 MED ORDER — DIPHENHYDRAMINE HCL 50 MG/ML IJ SOLN
INTRAMUSCULAR | Status: AC
  Administered 2015-09-25: 50 mg
  Filled 2015-09-25: qty 1

## 2015-09-25 MED ORDER — SODIUM CHLORIDE 0.9 % IV SOLN
INTRAVENOUS | Status: AC
  Administered 2015-09-25 (×3): via INTRAVENOUS_CENTRAL
  Filled 2015-09-25 (×3): qty 200

## 2015-09-25 MED ORDER — ACETAMINOPHEN 325 MG PO TABS: 650.0000 mg | ORAL_TABLET | ORAL | Status: DC | PRN

## 2015-09-25 MED ORDER — EPINEPHRINE 0.3 MG/0.3ML IJ SOAJ: 0.3000 mg | Freq: Once | INTRAMUSCULAR | Status: DC

## 2015-09-25 MED ORDER — CALCIUM GLUCONATE 10 % IV SOLN
2.0000 g | Freq: Once | INTRAVENOUS | Status: AC
  Administered 2015-09-25: 2 g via INTRAVENOUS
  Filled 2015-09-25: qty 20

## 2015-09-25 MED ORDER — ACD FORMULA A 0.73-2.45-2.2 GM/100ML VI SOLN
500.0000 mL | Status: DC
  Administered 2015-09-25: 500 mL via INTRAVENOUS

## 2015-09-25 MED ORDER — ACD FORMULA A 0.73-2.45-2.2 GM/100ML VI SOLN
Status: AC
  Administered 2015-09-25: 500 mL via INTRAVENOUS
  Filled 2015-09-25: qty 1000

## 2015-09-25 MED ORDER — DIPHENHYDRAMINE HCL 25 MG PO CAPS: 25.0000 mg | ORAL_CAPSULE | Freq: Four times a day (QID) | ORAL | Status: DC | PRN

## 2015-09-25 MED ORDER — HEPARIN SODIUM (PORCINE) 1000 UNIT/ML IJ SOLN: 1000.0000 [IU] | Freq: Once | INTRAMUSCULAR | Status: DC

## 2015-09-25 MED ORDER — EPINEPHRINE HCL 1 MG/ML IJ SOLN
0.3000 mg | Freq: Once | INTRAMUSCULAR | Status: AC
Start: 2015-09-25 — End: 2015-09-25
  Administered 2015-09-25: 0.3 mg via INTRAMUSCULAR
  Filled 2015-09-25: qty 1

## 2015-09-25 MED ORDER — INSULIN ASPART 100 UNIT/ML ~~LOC~~ SOLN
15.0000 [IU] | Freq: Three times a day (TID) | SUBCUTANEOUS | Status: DC
  Administered 2015-09-25 – 2015-09-26 (×4): 15 [IU] via SUBCUTANEOUS

## 2015-09-25 MED ORDER — EPINEPHRINE HCL 0.1 MG/ML IJ SOSY
0.3000 mg | PREFILLED_SYRINGE | Freq: Once | INTRAMUSCULAR | Status: DC
  Filled 2015-09-25 (×2): qty 10

## 2015-09-25 NOTE — Progress Notes (Signed)
Pt call with the c/o of feeling swelling in his throat which started an hour ago, vital signs checked all within normal, he is on heart monitor and contineous O2 monitor, lungs sound clear ,checked his throat with a light but did not see any swelling tonsils, MD from family medicine was paged as well as rapid response team, all came over to review pt, all orders from MD that came to review pt are carrired, pt verbalised that he feeling bit ok after the prescribed med given, MD plan to transfer pt to step down for further care, pt at this moment has been hand over to the first shift nurse that took over from me

## 2015-09-25 NOTE — Progress Notes (Signed)
OT Cancellation Note  Patient Details Name: Tanner Mason MRN: [id number] DOB: [birth year]   Cancelled Treatment:    Reason Eval/Treat Not Completed: Other (comment).  Pt is with Pam, PA from CIR. Will check later if schedule permits.  Elena Davia 09/25/2015, 11:01 AM  Marica OtterMaryellen Xayden Linsey, OTR/L [phone] 09/25/2015

## 2015-09-25 NOTE — Care Management Important Message (Signed)
Important Message  Patient Details  Name: Tanner Mason MRN: [id number] Date of Birth: 02/14/1941   Medicare Important Message Given:  Yes    Antar Milks P Buffi Ewton 09/25/2015, 10:00 AM

## 2015-09-25 NOTE — Progress Notes (Signed)
Physical Therapy Treatment Patient Details Name: Tanner Mason MRN: [id number] DOB: Apr 28, 1941 Today's Date: 09/25/2015    History of Present Illness Tanner Mason is an 75 y.o. male patient had abrupt onset of numbness and tingling in his b/l hands on Saturday in addition to numbness in his feet bilaterally. He has known diabetic neuropathy but this was different. Marland Kitchen. He was evaluated at an outside hospital who imaged his brain and cervical spine. Reports that no acute processes were found and discharged him home. HE has also had a MRI of brain and cervical spine (which I cannot find on the floor) and he was told they were normal. Due to worsening balance issues and weakness [facility] Three days after the initial start date he now cannot stand on his own due to significant weakness of his legs. He also has worsening numbness in his legs. HE has no reflexes in his Knees and states he usually does.     PT Comments    Patient continues to make some gradual progression toward mobility goals. Continues to be mod/max A for ambulation. Attempt use of [place] DecSara plus next session for gait training. Continue to progress as tolerated.   Follow Up Recommendations  CIR     Equipment Recommendations  Other (comment)    Recommendations for Other Services Rehab consult     Precautions / Restrictions Precautions Precautions: Fall Restrictions Weight Bearing Restrictions: No    Mobility  Bed Mobility Overal bed mobility: Modified Independent Bed Mobility: Supine to Sit           General bed mobility comments: increased time and use of bed  Transfers Overall transfer level: Needs assistance Equipment used: Rolling walker (2 wheeled) Transfers: Sit to/from Stand Sit to Stand: Min assist;+2 safety/equipment        Lateral/Scoot Transfers: Min guard General transfer comment: min guard for safety during lateral scoot EOB to drop arm recliner; assist to achieve erect  posture and for balance upon standing to transition hand placement to RW; cues for hand placement and technique  Ambulation/Gait Ambulation/Gait assistance: Mod assist;Max assist;+2 safety/equipment Ambulation Distance (Feet): 40 Feet (20X2) Assistive device: Rolling walker (2 wheeled) Gait Pattern/deviations: Ataxic;Trunk flexed     General Gait Details: grossly mod A +2 but with need for max A at times especially when pt becomes fatigued; cues for posture, pushing through bilat UE, and forward gaze; assist to advance bilat LE forward    Stairs            Wheelchair Mobility    Modified Rankin (Stroke Patients Only)       Balance Overall balance assessment: Needs assistance   Sitting balance-Leahy Scale: Good     Standing balance support: Bilateral upper extremity supported Standing balance-Leahy Scale: Poor                      Cognition Arousal/Alertness: Awake/alert Behavior During Therapy: WFL for tasks assessed/performed Overall Cognitive Status: Within Functional Limits for tasks assessed                      Exercises      General Comments General comments (skin integrity, edema, etc.): discussed LE positioning when resting in recliner due to pt's c/o tight hamstrings with standing and educated pt to work on [employer]      Pertinent Vitals/Pain Pain Assessment: No/denies pain    Home Living  Prior Function            PT Goals (current goals can now be found in the care plan section) Acute Rehab PT Goals Patient Stated Goal: be able to walk again Progress towards PT goals: Progressing toward goals    Frequency  Min 4X/week    PT Plan Current plan remains appropriate    Co-evaluation             End of Session Equipment Utilized During Treatment: Gait belt Activity Tolerance: Patient tolerated treatment well Patient left: with call bell/phone within reach;in chair;with chair alarm set      Time: [id number] PT Time Calculation (min) (ACUTE ONLY): 19 min  Charges:  $Gait Training: 8-22 mins                    G Codes:      Tanner Mason, PTA Pager: [phone]   09/25/2015, 9:08 AM

## 2015-09-25 NOTE — Progress Notes (Signed)
Interval History:                                                                                                                      Tanner Mason is an 75 y.o. male patient with AIDP. He will receive his plast PLX today and awaiting for CIR placement. HE suffered from angioedema last night and has been placed on Antihistamine. This is not a new problem but a old reoccurring problem with no found etiology to date.    Past Medical History: Past Medical History  Diagnosis Date  . Elbow dislocation [id number]    Right  . Treadmill stress test negative for angina pectoris 06/2002    poor HR and BP recovery  . Abdominal ultrasound, abnormal 02/2004    fatty liver  . Encounter for diagnostic endoscopy 10/22/04    negative  . History of esophagogastroduodenoscopy 08/13/2007    normal  . Diabetes mellitus without complication [facility])     Past Surgical History  Procedure Laterality Date  . Inguinal hernia repair  1947  . Anterior cruciate ligament repair  5/98    Left, staph infection complicated  . Blepharoplasty  03/2005    with ptosis repair  . Basal cell carcinoma excision  02/2005    R ear    Family History: Family History  Problem Relation Age of Onset  . Alzheimer's disease Mother     died age 103  . Aortic aneurysm Father     died age 45  . Anuerysm Father   . Aortic aneurysm Brother     repaired plus AI graft  . Diabetes Brother     Social History:   reports that he quit smoking about 48 years ago. His smoking use included Cigarettes. He started smoking about 73 years ago. He has a 10 pack-year smoking history. He has never used smokeless tobacco. He reports that he drinks about 1.0 oz of alcohol per week. He reports that he does not use illicit drugs.  Allergies:  Allergies  Allergen Reactions  . Ace Inhibitors Other (See Comments)    REACTION: Angioedema  . Calcium Carbonate Antacid Other (See Comments)    REACTION: Angioedema of mouth     Medications:                                                                                                                          Current facility-administered medications:  .  acetaminophen (TYLENOL) tablet 650 mg, 650  mg, Oral, Q8H, Leighton Roach McDiarmid, MD, 650 mg at 09/25/15 0544 .  ALPRAZolam Prudy Feeler) tablet 0.25 mg, 0.25 mg, Oral, QHS PRN, Nestor Ramp, MD, 0.25 mg at 09/24/15 2147 .  amLODipine (NORVASC) tablet 10 mg, 10 mg, Oral, Daily, Ashly M Gottschalk, DO, 10 mg at 09/25/15 0914 .  aspirin chewable tablet 81 mg, 81 mg, Oral, QHS, Ashly Hulen Skains, DO, 81 mg at 09/24/15 2147 .  atorvastatin (LIPITOR) tablet 40 mg, 40 mg, Oral, q1800, Ashly M Gottschalk, DO, 40 mg at 09/24/15 1604 .  diphenhydrAMINE (BENADRYL) injection 50 mg, 50 mg, Intravenous, Once, Asiyah Mayra Reel, MD, 50 mg at 09/25/15 0715 .  finasteride (PROSCAR) tablet 5 mg, 5 mg, Oral, QHS, Ashly M Gottschalk, DO, 5 mg at 09/24/15 2148 .  gabapentin (NEURONTIN) capsule 600 mg, 600 mg, Oral, BID, Ashly M Gottschalk, DO, 600 mg at 09/25/15 0914 .  insulin aspart (novoLOG) injection 0-15 Units, 0-15 Units, Subcutaneous, TID WC, Campbell Stall, MD, 5 Units at 09/25/15 0912 .  insulin aspart (novoLOG) injection 0-5 Units, 0-5 Units, Subcutaneous, QHS, Campbell Stall, MD, 3 Units at 09/23/15 2146 .  insulin aspart (novoLOG) injection 15 Units, 15 Units, Subcutaneous, TID WC, Campbell Stall, MD .  insulin glargine (LANTUS) injection 45 Units, 45 Units, Subcutaneous, Daily, Campbell Stall, MD, 45 Units at 09/25/15 0914 .  metoprolol (LOPRESSOR) tablet 100 mg, 100 mg, Oral, BID, Ashly M Gottschalk, DO, 100 mg at 09/25/15 0914 .  oxyCODONE (Oxy IR/ROXICODONE) immediate release tablet 5 mg, 5 mg, Oral, Q4H PRN, Leighton Roach McDiarmid, MD, 5 mg at 09/23/15 0836 .  pantoprazole (PROTONIX) EC tablet 20 mg, 20 mg, Oral, Daily, Ashly M Gottschalk, DO, 20 mg at 09/25/15 0914 .  polyethylene glycol (MIRALAX / GLYCOLAX) packet 17 g, 17 g, Oral, BID, Campbell Stall, MD,  17 g at 09/25/15 0914 .  potassium chloride (K-DUR,KLOR-CON) CR tablet 10 mEq, 10 mEq, Oral, Daily, Ashly M Gottschalk, DO, 10 mEq at 09/25/15 0914 .  tamsulosin (FLOMAX) capsule 0.4 mg, 0.4 mg, Oral, Daily, Ashly M Gottschalk, DO, 0.4 mg at 09/25/15 0914 .  traMADol (ULTRAM) tablet 50 mg, 50 mg, Oral, Q6H PRN, Yolande Jolly, MD .  traZODone (DESYREL) tablet 50 mg, 50 mg, Oral, QHS PRN, Campbell Stall, MD, 50 mg at 09/21/15 2127   Neurologic Examination:                                                                                                     Today's Vitals   09/24/15 2035 09/24/15 2037 09/24/15 2247 09/25/15 0637  BP:  116/60  126/64  Pulse:  62  75  Temp:  99.8 F (37.7 C)  98.2 F (36.8 C)  TempSrc:    Oral  Resp:  18  22  Height:      Weight:      SpO2:  61%  98%  PainSc: 0-No pain  0-No pain     Evaluation of higher integrative functions including: Level of alertness: lethargic secondary to benadryl  Oriented to time,  place and person Speech: fluent, no evidence of dysarthria or aphasia noted.  Test the following cranial nerves: 2-12 grossly intact Motor examination: Normal tone, bulk, full 5/5 motor strength in upper extremities and 4/5 LE weaker in the DF L>R Examination of sensation : decreased in the LE from Knee to feet but improved.  Examination of deep tendon reflexes: 2+ UE with no LE   Lab Results: Basic Metabolic Panel:  Recent Labs Lab 09/21/15 0629 09/21/15 0756 09/21/15 1121 09/22/15 0538 09/23/15 0930 09/24/15 0417 09/25/15 0349  NA 138 141  --  137 134* 136 136  K 3.2* 3.4*  --  3.8 3.5 3.8 3.8  CL 109 105  --  110 103 108 104  CO2 21*  --   --  20* 24 21* 23  GLUCOSE 265* 272*  --  250* 254* 225* 204*  BUN 23* 23*  --  16 21* 24* 22*  CREATININE 1.55* 1.50*  --  1.36* 1.46* 1.61* 1.64*  CALCIUM 8.6*  --   --  8.6* 8.8* 8.8* 9.2  MG  --   --  1.5* 1.9 1.9  --   --     Liver Function Tests:  Recent Labs Lab 09/20/15 1557   AST 25  ALT 21  ALKPHOS 44  BILITOT 1.1  PROT 5.9*  ALBUMIN 4.3   No results for input(s): LIPASE, AMYLASE in the last 168 hours. No results for input(s): AMMONIA in the last 168 hours.  CBC:  Recent Labs Lab 09/21/15 0629 09/21/15 0756 09/22/15 0538 09/23/15 0930 09/24/15 0417 09/25/15 0349  WBC 7.9  --  7.1 7.5 9.5 4.6  HGB 12.5* 11.6* 11.4* 11.5* 12.1* 11.8*  HCT 36.2* 34.0* 32.9* 33.2* 35.3* 35.2*  MCV 86.2  --  87.7 87.8 89.1 89.6  PLT 137*  --  116* 121* 125* 96*    Cardiac Enzymes: No results for input(s): CKTOTAL, CKMB, CKMBINDEX, TROPONINI in the last 168 hours.  Lipid Panel: No results for input(s): CHOL, TRIG, HDL, CHOLHDL, VLDL, LDLCALC in the last 168 hours.  CBG:  Recent Labs Lab 09/24/15 0739 09/24/15 1137 09/24/15 1636 09/24/15 2134 09/25/15 0754  GLUCAP 241* 398* 384* 148* 213*    Microbiology: Results for orders placed or performed during the hospital encounter of 09/17/15  MRSA PCR Screening     Status: None   Collection Time: 09/20/15 10:52 AM  Result Value Ref Range Status   MRSA by PCR NEGATIVE NEGATIVE Final    Comment:        The GeneXpert MRSA Assay (FDA approved for NASAL specimens only), is one component of a comprehensive MRSA colonization surveillance program. It is not intended to diagnose MRSA infection nor to guide or monitor treatment for MRSA infections.     Imaging: Dg Lumbar Spine 2-3 Views  09/23/2015  CLINICAL DATA:  Low back pain for 2 days EXAM: LUMBAR SPINE - 2-3 VIEW COMPARISON:  None. FINDINGS: There is no evidence of lumbar spine fracture. Alignment is normal. Degenerative disc disease with disc height loss at L1-2. Bilateral facet arthropathy at L4-5 and L5-S1. Abdominal aortic atherosclerosis. IMPRESSION: No acute osseous injury of the lumbar spine. Electronically Signed   By: Elige KoHetal  Patel   On: 09/23/2015 16:03    Assessment and plan:   Dorthula RueCharles A Mattia is an 75 y.o. male patient with AIDP, HE  will receive his last PLX treatment today and after he may have his PICC lin removed. HE will need CIR.  Will be seen by Dr. Lavon Paganini. Please see his attestation note for A/P for any additional work up recommendations.

## 2015-09-25 NOTE — Progress Notes (Signed)
CSW checked in with pt and wife about SNF choice if CIR cannot admit- they have not researched SNF at this time and are very hopeful for CIR admission.  CSW encouraged to look at list and make a choice in case CIR is not an option on day of DC  CSW will continue to follow  Merlyn LotJenna Holoman, Cass Regional Medical CenterCSWA Clinical Social Worker [phone]

## 2015-09-25 NOTE — Consult Note (Signed)
Physical Medicine and Rehabilitation Consult Reason for Consult: GBS Referring Physician: Dr. Randolm Idol   HPI: Tanner Mason is a 75 y.o. male with history of DMT2 with neuropathy and gait disorder  who was admitted on 09/17/15 with numbness of feet with worsening balance and progressive weakness. Had been seen in the ED few days before with negative work up but continued to worsen and was found to be areflexic with work up consistent with GBS. MRI lumbar spine with mild to moderate stenosis L4/5 and L5/S1 and LP with elevated glucose, elevated protein and elevated IgG. Cultures negative. He was started on PLEX with improvement in strength with ability to stand--last PLEX today.  Angioedema last night treated with antihistamines and felt to be due to chronic issues . CIR recommended for follow up therapy.    Patient without pain complaints. He's had recurrent angioedema  Affecting mouth and throat which occurs even at home. Received prednisone and Benadryl this morning very drowsy from the Benadryl. Review of Systems  Unable to perform ROS: mental acuity  Respiratory: Positive for shortness of breath.   Cardiovascular: Negative for chest pain, palpitations, claudication and leg swelling.  Gastrointestinal: Positive for heartburn.  Genitourinary:       Incontinence  Skin: Negative for rash.  Neurological: Negative for dizziness, tingling and headaches.  Psychiatric/Behavioral: Negative for depression. The patient has insomnia. The patient is not nervous/anxious.      Past Medical History  Diagnosis Date  . Elbow dislocation [id number]    Right  . Treadmill stress test negative for angina pectoris 06/2002    poor HR and BP recovery  . Abdominal ultrasound, abnormal 02/2004    fatty liver  . Encounter for diagnostic endoscopy 10/22/04    negative  . History of esophagogastroduodenoscopy 08/13/2007    normal  . Diabetes mellitus without complication [facility])     Past Surgical  History  Procedure Laterality Date  . Inguinal hernia repair Right 1947  . Anterior cruciate ligament repair  5/98    Left, staph infection complicated  . Blepharoplasty  03/2005    with ptosis repair  . Basal cell carcinoma excision  02/2005    R ear  . Inguinal hernia repair Left 1970    Family History  Problem Relation Age of Onset  . Alzheimer's disease Mother     died age 77  . Aortic aneurysm Father     died age 67  . Anuerysm Father   . Aortic aneurysm Brother     repaired plus AI graft  . Diabetes Brother     Social History:  Married. Active and works out 5 days weeks. 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 He  reports that he quit smoking about 48 years ago. His smoking use included Cigarettes. He started smoking about 73 years ago. He has a 10 pack-year smoking history. He has never used smokeless tobacco. He reports that he drinks about a beer or mixed drink occasionally.  He reports that he does not use illicit drugs.    Allergies  Allergen Reactions  . Ace Inhibitors Other (See Comments)    REACTION: Angioedema  . Calcium Carbonate Antacid Other (See Comments)    REACTION: Angioedema of mouth    Medications Prior to Admission  Medication Sig Dispense Refill  . amLODipine (NORVASC) 10 MG tablet Take 10 mg by mouth daily.    Marland Kitchen aspirin ([employer] ASPIRIN) 81 MG chewable tablet Chew  81 mg by mouth daily.      Marland Kitchen atorvastatin (LIPITOR) 40 MG tablet TAKE ONE TABLET BY MOUTH ONE TIME DAILY 90 tablet 1  . cetirizine (ZYRTEC) 10 MG tablet Take 10 mg by mouth every evening.    . diphenhydrAMINE (BENADRYL) 12.5 MG/5ML elixir Take 25 mg by mouth 4 (four) times daily as needed for allergies.    . finasteride (PROSCAR) 5 MG tablet Take 1 tablet (5 mg total) by mouth daily. 90 tablet 1  . gabapentin (NEURONTIN) 600 MG tablet Take 1 tablet (600 mg total) by mouth 3 (three) times daily. 270 tablet 1  . hydrochlorothiazide (HYDRODIURIL) 25 MG tablet  TAKE ONE-HALF TABLET BY MOUTH DAILY 45 tablet 1  . insulin lispro (HUMALOG) 100 UNIT/ML KiwkPen Inject 10 units with breakfast, 14 units with lunch, and 14 units with dinner. 15 mL 11  . LANTUS SOLOSTAR 100 UNIT/ML Solostar Pen INJECT 50 UNITS INTO THE SKIN DAILY 5 pen 3  . metoprolol (LOPRESSOR) 100 MG tablet TAKE ONE TABLET BY MOUTH TWICE DAILY 180 tablet 0  . omeprazole (PRILOSEC) 20 MG capsule TAKE ONE CAPSULE BY MOUTH ONE TIME DAILY 90 capsule 1  . ONE TOUCH ULTRA TEST test strip USE TO CHECK BLOOD SUGAR THREE TIMES DAILY 100 each 2  . potassium chloride (K-DUR,KLOR-CON) 10 MEQ tablet TAKE TWO TABLETS BY MOUTH DAILY 180 tablet 1  . tamsulosin (FLOMAX) 0.4 MG CAPS capsule TAKE ONE CAPSULE BY MOUTH ONE TIME DAILY 90 capsule 1  . VICTOZA 18 MG/3ML SOPN INJECT 1.8MG  INTO THE SKIN DAILY 5 pen 5  . BD PEN NEEDLE NANO U/F 32G X 4 MM MISC USE AS INSTRUCTED BY YOUR PHYSICIAN TO CHECK BLOOD SUGAR (Patient not taking: Reported on 09/17/2015) 100 each 1  . doxycycline (VIBRAMYCIN) 50 MG capsule Take 2 capsules (100 mg total) by mouth 2 (two) times daily. (Patient not taking: Reported on 09/17/2015) 20 capsule 0  . EPIPEN 2-PAK 0.3 MG/0.3ML SOAJ injection INJECT 0.3MLS INTO THE MUSLE ONCE 2 Device 1    Home: Home Living Family/patient expects to be discharged to:: Private residence Living Arrangements: Spouse/significant other Available Help at Discharge: Family Type of Home: House Home Access: Stairs to enter Secretary/administrator of Steps: 4 Entrance Stairs-Rails: None Home Layout: One level Bathroom Shower/Tub: Health visitor: Handicapped height Home Equipment: Crutches, Other (comment), Grab bars - tub/shower  Functional History: Prior Function Level of Independence: Independent Comments: Very active. Functional Status:  Mobility: Bed Mobility Overal bed mobility: Modified Independent Bed Mobility: Supine to Sit Supine to sit: Min assist, HOB elevated General bed  mobility comments: increased time and use of bed Transfers Overall transfer level: Needs assistance Equipment used: Rolling walker (2 wheeled) Transfers: Sit to/from Stand Sit to Stand: Min assist, +2 safety/equipment  Lateral/Scoot Transfers: Min guard General transfer comment: min guard for safety during lateral scoot EOB to drop arm recliner; assist to achieve erect posture and for balance upon standing to transition hand placement to RW; cues for hand placement and technique Ambulation/Gait Ambulation/Gait assistance: Mod assist, Max assist, +2 safety/equipment Ambulation Distance (Feet): 40 Feet (20X2) Assistive device: Rolling walker (2 wheeled) Gait Pattern/deviations: Ataxic, Trunk flexed General Gait Details: grossly mod A +2 but with need for max A at times especially when pt becomes fatigued; cues for posture, pushing through bilat UE, and forward gaze; assist to advance bilat LE forward     ADL: ADL Overall ADL's : Needs assistance/impaired Eating/Feeding: Set up, Sitting Eating/Feeding Details (indicate cue  type and reason): not coordinated but he can do it.  Some issues with cutting food. Grooming: Wash/dry hands, Wash/dry face, Oral care, Set up, Sitting Grooming Details (indicate cue type and reason): Pt fatigues quickly.  Pt did put shower cap on head and did rinseless hair wash with min assist to get help to finish b/c arms fatigue overhead. Upper Body Bathing: Minimal assitance, Sitting Upper Body Bathing Details (indicate cue type and reason): assist to be thorough Lower Body Bathing: Moderate assistance, Sit to/from stand Lower Body Bathing Details (indicate cue type and reason): mod assist needed to get to his feet and then he holds to walker while therapist washes bottom.  Pt locks out his knees in standing or they buckle.  Pt is a fall risk in standing. Upper Body Dressing : Set up, Sitting Upper Body Dressing Details (indicate cue type and reason): assist with  buttons. Lower Body Dressing: Moderate assistance, Sit to/from stand Lower Body Dressing Details (indicate cue type and reason): assist to pull pants up.  Pt can stand but has to lock knees out and hold to walker while someone else manages pants in standing. Toilet Transfer: Minimal assistance, Requires drop arm, BSC (lateral scoot) Toilet Transfer Details (indicate cue type and reason): Pt laterally scoots very well which gives him some independence with transfers but is unable to stand and transfer to commode without +2 assist due to knees buckling. Toileting- Clothing Manipulation and Hygiene: Minimal assistance, Sitting/lateral lean Tub/Shower Transfer Details (indicate cue type and reason): discussed transfers.  Will see how pt progresses but might be better using tub shower with a bench for safety unless he can become more independent standing. Functional mobility during ADLs: Moderate assistance, +2 for physical assistance, Rolling walker, Cueing for safety, Cueing for sequencing General ADL Comments: Pt able to do most adls safely in sitting but needs a great amount of assist when he is on his feet standing for adls.  Cognition: Cognition Overall Cognitive Status: Within Functional Limits for tasks assessed Orientation Level: Oriented X4 Cognition Arousal/Alertness: Awake/alert Behavior During Therapy: WFL for tasks assessed/performed Overall Cognitive Status: Within Functional Limits for tasks assessed   Blood pressure 126/64, pulse 75, temperature 98.2 F (36.8 C), temperature source Oral, resp. rate 22, height 5\' 7"  (1.702 m), weight 70.7 kg (155 lb 13.8 oz), SpO2 98 %. Physical Exam  Nursing note and vitals reviewed. Constitutional: He appears well-developed and well-nourished. He appears lethargic. He is easily aroused.  HENT:  Head: Normocephalic and atraumatic.  Eyes: Conjunctivae are normal. Pupils are equal, round, and reactive to light.  Neck: Normal range of motion.  Neck supple.  Cardiovascular: Normal rate and regular rhythm.   Respiratory: Effort normal and breath sounds normal.  GI: Soft. Bowel sounds are normal. He exhibits no distension. There is no tenderness.  Musculoskeletal: He exhibits no edema or tenderness.  Neurological: He is easily aroused. He appears lethargic.  Skin: Skin is warm and dry.  Motor strength is 4/5 bilateral deltoids, biceps, triceps, grip  3+ hip flexor 45 knee extensor, 2 minus ankle dorsiflexor and plantar flexor, trace toe flexion and extension Sensation intact to light touch and proprioception bilateral lower limbs. Mild ataxia noted in the lumbar upper limbs. Extraocular muscles intact Neuro:  Eyes without evidence of nystagmus  Tone is normal without evidence of spasticity   Cranial nerves II- Visual fields are intact to confrontation testing, no blurring of vision III- no evidence of ptosis, upward, downward and medial gaze intact IV- no  vertical diplopia or head tilt V- no facial numbness or masseter weakness VI- no pupil abduction weakness VII- no facial droop, good lid closure VII- normal auditory acuity IX- no pharygeal weakness, gag nl X- no pharyngeal weakness, no hoarseness XI- no trap or SCM weakness XII- no glossal weakness    Results for orders placed or performed during the hospital encounter of 09/17/15 (from the past 24 hour(s))  Glucose, capillary     Status: Abnormal   Collection Time: 09/24/15 11:37 AM  Result Value Ref Range   Glucose-Capillary 398 (H) 65 - 99 mg/dL  Glucose, capillary     Status: Abnormal   Collection Time: 09/24/15  4:36 PM  Result Value Ref Range   Glucose-Capillary 384 (H) 65 - 99 mg/dL  Glucose, capillary     Status: Abnormal   Collection Time: 09/24/15  9:34 PM  Result Value Ref Range   Glucose-Capillary 148 (H) 65 - 99 mg/dL   Comment 1 Notify RN    Comment 2 Document in Chart   Basic metabolic panel     Status: Abnormal   Collection Time: 09/25/15   3:49 AM  Result Value Ref Range   Sodium 136 135 - 145 mmol/L   Potassium 3.8 3.5 - 5.1 mmol/L   Chloride 104 101 - 111 mmol/L   CO2 23 22 - 32 mmol/L   Glucose, Bld 204 (H) 65 - 99 mg/dL   BUN 22 (H) 6 - 20 mg/dL   Creatinine, Ser 1.611.64 (H) 0.61 - 1.24 mg/dL   Calcium 9.2 8.9 - 09.610.3 mg/dL   GFR calc non Af Amer 40 (L) >60 mL/min   GFR calc Af Amer 46 (L) >60 mL/min   Anion gap 9 5 - 15  CBC     Status: Abnormal   Collection Time: 09/25/15  3:49 AM  Result Value Ref Range   WBC 4.6 4.0 - 10.5 K/uL   RBC 3.93 (L) 4.22 - 5.81 MIL/uL   Hemoglobin 11.8 (L) 13.0 - 17.0 g/dL   HCT 04.535.2 (L) 40.939.0 - 81.152.0 %   MCV 89.6 78.0 - 100.0 fL   MCH 30.0 26.0 - 34.0 pg   MCHC 33.5 30.0 - 36.0 g/dL   RDW 91.415.1 78.211.5 - 95.615.5 %   Platelets 96 (L) 150 - 400 K/uL  Glucose, capillary     Status: Abnormal   Collection Time: 09/25/15  7:54 AM  Result Value Ref Range   Glucose-Capillary 213 (H) 65 - 99 mg/dL  Protime-INR     Status: Abnormal   Collection Time: 09/25/15  9:42 AM  Result Value Ref Range   Prothrombin Time 16.1 (H) 11.6 - 15.2 seconds   INR 1.27 0.00 - 1.49   Dg Lumbar Spine 2-3 Views  09/23/2015  CLINICAL DATA:  Low back pain for 2 days EXAM: LUMBAR SPINE - 2-3 VIEW COMPARISON:  None. FINDINGS: There is no evidence of lumbar spine fracture. Alignment is normal. Degenerative disc disease with disc height loss at L1-2. Bilateral facet arthropathy at L4-5 and L5-S1. Abdominal aortic atherosclerosis. IMPRESSION: No acute osseous injury of the lumbar spine. 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 Does the need for close, 24 hr/day medical supervision in concert with the patient's rehab needs make it unreasonable for this patient to be served in a less intensive setting? Yes 2. Co-Morbidities requiring supervision/potential complications: Angioedema, lumbar spondylosis, diabetes with  peripheral  neuropathy 3. Due to bladder management, bowel management, safety, skin/wound care, disease management, medication administration, pain management and patient education, does the patient require 24 hr/day rehab nursing? Yes 4. Does the patient require coordinated care of a physician, rehab nurse, PT (1-2 hrs/day, 5 days/week) and OT (1-2 hrs/day, 5 days/week) to address physical and functional deficits in the context of the above medical diagnosis(es)? Yes Addressing deficits in the following areas: balance, endurance, locomotion, strength, transferring, bowel/bladder control, bathing, dressing, feeding, grooming and toileting 5. Can the patient actively participate in an intensive therapy program of at least 3 hrs of therapy per day at least 5 days per week? Yes 6. The potential for patient to make measurable gains while on inpatient rehab is excellent 7. Anticipated functional outcomes upon discharge from inpatient rehab are modified independent and supervision  with PT, modified independent and supervision with OT, n/a with SLP. 8. Estimated rehab length of stay to reach the above functional goals is: 10-14d 9. Does the patient have adequate social supports and living environment to accommodate these discharge functional goals? Yes 10. Anticipated D/C setting: Home 11. Anticipated post D/C treatments: HH therapy 12. Overall Rehab/Functional Prognosis: excellent  RECOMMENDATIONS: This patient's condition is appropriate for continued rehabilitative care in the following setting: CIR Patient has agreed to participate in recommended program. Yes Note that insurance prior authorization may be required for reimbursement for recommended care.  Comment: We will finish last plasma exchange today    09/25/2015

## 2015-09-25 NOTE — Progress Notes (Signed)
OT Cancellation Note  Patient Details Name: Tanner Mason MRN: [id number] DOB: [birth year]   Cancelled Treatment:    Reason Eval/Treat Not Completed: Other (comment)  Liahm Grivas 09/25/2015, 11:01 AM  [phone] 09/25/2015

## 2015-09-25 NOTE — Progress Notes (Signed)
I met with pt and his wife at bedside to discuss a possible inpt rehab admission pending insurance approval. I await OT eval so I can proceed with authorization Tuesday for insurance is closed today for the holiday. Wife prefers inpt rehab admit. Pt up in chair but very sleepy at this time. I will follow up in the morning. [phone]

## 2015-09-25 NOTE — Progress Notes (Signed)
TPE- treatment course completed. Pt tolerated today treatment well. BP drop into 90s x1 relieved with saline bolus. All other vitals remained stable throughout. Pt remains sleepy but easily arousable. Report called to primary RN. Currently has no complaints.

## 2015-09-25 NOTE — Progress Notes (Signed)
Family Medicine Teaching Service Daily Progress Note Intern Pager: [phone]  Patient name: Tanner Mason Medical record number: [id number] Date of birth: 30-Jul-1940 Age: 75 y.o. Gender: male  Primary Care Provider: Lupita Dawn, MD Consultants: Neurosurgery Code Status: Full  Pt Overview and Major Events to Date:  5/21: Admitted to FMTS with weakness and gait abnormality 5/23: Plasmapheresis Treatment #1 5/24: Plasmapheresis Treatment #2 5/25: Plasmapheresis Treatment #3 5/27: Plasmapheresis Treatment #4 5/29: Developed throat swelling, received Epi x 1, transferred to stepdown unit.  Assessment and Plan: DAVIYON WIDMAYER is a 75 y.o. male presenting with weakness/ gait abnormality, hypokalemia . PMH is significant for lumbar radiculopathy affecting LLE, HTN, HLD, DM2 w/ peripheral neuropathy, CKD3, GERD  # Weakness/Gait abnormality 2/2 Guillain Barre: Numbness in thighs and hands has completely resolved. Numbness in his feet is improving. - Continue telemetry for increased risk for arrhythmias  - PLEX daily x 3 days (5/23-5/25), then every other day (5/27, 5/29) for a total of 5 treatments. Will receive last treatment today. - Watch INR on plasmapheresis. INR stable at 1.23 > pending this morning. - Xanax 0.40m prn for anxiety during plasmapheresis. - Neurology following, appreciate recommendations. - Neuro checks q4hrs - PT/OT recommending CIR. Waiting for CIR to evaluate.  # Throat swelling, resolved: Developed throat swelling early this morning. Treated with Epinephrine x 1, Benadryl, Solumedrol and subsequently improved. Has a hx of idiopathic angioedema, uses Epinephrine at home. No new medications given in the last 24 hours. Not on an ACE inhibitor. No wheezing on exam. - Monitor in stepdown unit - Will give additional doses of epinephrine, benadryl, or solumedrol as needed.  # DM2 w/ peripheral neuropathy:  CBGs 148-398 in the last 24 hours. Last A1c 6.4 (06/2015).  On Lantus 50u qd, humalog 02/09/13, and Neurontin 6071mTID at home. Has required 15 units of Novolog per sliding scale in the last 24 hours. Was not given any SSI at dinner, even though his blood sugar was 384. - Continue Lantus 45 units daily. - Will increase Novolog from 10 units tid to 15 units tid with meals, especially given that he received Solumedrol this morning. - Moderate SSI - CBG monitoring QAC and HS - Neurontin at 60024mID    # CKD3: Cr 1.46 > 1.61 (baseline 1.5-1.8).Continues to appear euvolemic. - Avoid nephrotoxic agents - Daily BMETs  # Low back pain: Chronic problem but worsened by sleeping in hospital bed. Lumbar x-ray (5/27) showed no evidence of lumbar spine fracture, degenerative disc disease with disc height loss at L1-2. - Encouraged Pt to be out of bed as much as possible during the day - K-pad - Oxycodone 5mg68mhrs prn, Tramadol 50mg22mrs prn  # HTN: BP 126/64 this morning.  Norvasc 10mg,36mressor 100mg, 44mHCTZ 12.5mg - H48ming HCTZ  - Continue Lopressor and Norvasc - Monitor BPs  # GERD: on Omeprazole outpatient - Protonix 20mg whi62mospitalized  # BPH: on Flomax and Proscar outpatient - Continue home meds  # Constipation, resolved: Refractory to Miralax - s/p soap suds enema on 5/27, had 2 BMs  FEN/GI: Heart healthy, carb-mod diet; Protonix Prophylaxis: SCDs  Disposition: CIR vs SNF. Can be discharged after plasmapheresis today.  Subjective:  Pt doing fine this morning. Feeling back to normal after the epinephrine. States he has had angioedema > 20 times in the past and uses Epinephrine at home. No shortness of breath. Ambulated with PT this morning.  Objective: Temp:  [98 F (36.7 C)-99.8 F (37.7  C)] 98.2 F (36.8 C) (05/29 5217) Pulse Rate:  [62-75] 75 (05/29 0637) Resp:  [18-22] 22 (05/29 0637) BP: (102-126)/(55-64) 126/64 mmHg (05/29 0637) SpO2:  [61 %-100 %] 98 % (05/29 4715) Physical Exam: General: Sitting up in chair, in  NAD HEENT: [place]/AT, EOMI, MMM Cardiovascular: RRR, no murmurs Respiratory: CTAB, normal WOB  Abdomen: +BS, soft, NT/ND Neuro:  5/5 muscle strength in the upper and lower extremities bilaterally, light touch sensation in tact LE Psych: Appropriate affect, normal behavior, mood stable, speech normal  Laboratory:  Recent Labs Lab 09/23/15 0930 09/24/15 0417 09/25/15 0349  WBC 7.5 9.5 4.6  HGB 11.5* 12.1* 11.8*  HCT 33.2* 35.3* 35.2*  PLT 121* 125* 96*    Recent Labs Lab 09/20/15 1557  09/23/15 0930 09/24/15 0417 09/25/15 0349  NA 134*  < > 134* 136 136  K 3.1*  < > 3.5 3.8 3.8  CL 103  < > 103 108 104  CO2 21*  < > 24 21* 23  BUN 29*  < > 21* 24* 22*  CREATININE 1.89*  < > 1.46* 1.61* 1.64*  CALCIUM 8.9  < > 8.8* 8.8* 9.2  PROT 5.9*  --   --   --   --   BILITOT 1.1  --   --   --   --   ALKPHOS 44  --   --   --   --   ALT 21  --   --   --   --   AST 25  --   --   --   --   GLUCOSE 354*  < > 254* 225* 204*  < > = values in this interval not displayed. Mag 1.7 > 1.9 TSH 1.323 Vitamin B12 471  PT 15.0, INR 1.16 UA: no bacteria, trace Hgb, neg ketones, neg leukocytes, no WBCs, no RBCs CRP 0.6 and ESR 25  Dg Lumbar Spine 2-3 Views  09/23/2015  CLINICAL DATA:  Low back pain for 2 days EXAM: LUMBAR SPINE - 2-3 VIEW COMPARISON:  None. FINDINGS: There is no evidence of lumbar spine fracture. Alignment is normal. Degenerative disc disease with disc height loss at L1-2. Bilateral facet arthropathy at L4-5 and L5-S1. Abdominal aortic atherosclerosis. IMPRESSION: No acute osseous injury of the lumbar spine. Electronically Signed   By: Kathreen Devoid   On: 09/23/2015 16:03    Sela Hua, MD 09/25/2015, 8:11 AM PGY-1, [place] Intern pager: [phone], text pages welcome

## 2015-09-25 NOTE — Progress Notes (Signed)
FPTS Interim Progress Note  S:Was called be nurse to come evaluate patient. Patient with sudden onset of throat swelling. Patient states he has long standing hx of idiopathic angioedema.  However, states he normal only has swelling of the lips and tongue, not his throat.    Per discussion with nursing no new medications. A couple of hours ago received tylenol   O: BP 126/64 mmHg  Pulse 75  Temp(Src) 98.2 F (36.8 C) (Oral)  Resp 22  Ht 5\' 7"  (1.702 m)  Wt 155 lb 13.8 oz (70.7 kg)  BMI 24.41 kg/m2  SpO2 98%  HEENT: Throat swollen, no stridor noted  Lungs: CTAB, breath sounds heard throughout   A/P: Angioedema, throat swelling  - Continues pulse oximetry  - Provide Epi 0.3 mg x 1, patient breathing improved with next 3 minutes  - Benadryl 50 mg IV  - Solumedrol 80 mg IV  - Transfer to [facility]   Jaculin Rasmus Zahra Keyuana Wank, MD 09/25/2015, 12:05 PM PGY-1 [facility] pager [phone]

## 2015-09-26 ENCOUNTER — Inpatient Hospital Stay (HOSPITAL_COMMUNITY)
Admission: RE | Admit: 2015-09-26 | Discharge: 2015-10-09 | DRG: 948 | Disposition: A | Discharge status: Home / Self Care | Payer: Medicare Other | Source: Intra-hospital | Attending: Physical Medicine & Rehabilitation | Admitting: Physical Medicine & Rehabilitation

## 2015-09-26 ENCOUNTER — Encounter (HOSPITAL_COMMUNITY): Payer: Self-pay | Admitting: *Deleted

## 2015-09-26 DIAGNOSIS — D649 Anemia, unspecified: Secondary | ICD-10-CM | POA: Diagnosis present

## 2015-09-26 DIAGNOSIS — Z87891 Personal history of nicotine dependence: Secondary | ICD-10-CM | POA: Diagnosis not present

## 2015-09-26 DIAGNOSIS — Z794 Long term (current) use of insulin: Secondary | ICD-10-CM

## 2015-09-26 DIAGNOSIS — K639 Disease of intestine, unspecified: Secondary | ICD-10-CM | POA: Diagnosis not present

## 2015-09-26 DIAGNOSIS — T783XXA Angioneurotic edema, initial encounter: Secondary | ICD-10-CM | POA: Diagnosis present

## 2015-09-26 DIAGNOSIS — Z888 Allergy status to other drugs, medicaments and biological substances status: Secondary | ICD-10-CM | POA: Diagnosis not present

## 2015-09-26 DIAGNOSIS — E1142 Type 2 diabetes mellitus with diabetic polyneuropathy: Secondary | ICD-10-CM | POA: Diagnosis present

## 2015-09-26 DIAGNOSIS — E1122 Type 2 diabetes mellitus with diabetic chronic kidney disease: Secondary | ICD-10-CM | POA: Diagnosis present

## 2015-09-26 DIAGNOSIS — N289 Disorder of kidney and ureter, unspecified: Secondary | ICD-10-CM | POA: Insufficient documentation

## 2015-09-26 DIAGNOSIS — E0821 Diabetes mellitus due to underlying condition with diabetic nephropathy: Secondary | ICD-10-CM

## 2015-09-26 DIAGNOSIS — I10 Essential (primary) hypertension: Secondary | ICD-10-CM | POA: Diagnosis not present

## 2015-09-26 DIAGNOSIS — Y848 Other medical procedures as the cause of abnormal reaction of the patient, or of later complication, without mention of misadventure at the time of the procedure: Secondary | ICD-10-CM | POA: Diagnosis present

## 2015-09-26 DIAGNOSIS — G610 Guillain-Barre syndrome: Secondary | ICD-10-CM | POA: Diagnosis not present

## 2015-09-26 DIAGNOSIS — N179 Acute kidney failure, unspecified: Secondary | ICD-10-CM | POA: Diagnosis present

## 2015-09-26 DIAGNOSIS — I951 Orthostatic hypotension: Secondary | ICD-10-CM | POA: Diagnosis not present

## 2015-09-26 DIAGNOSIS — I129 Hypertensive chronic kidney disease with stage 1 through stage 4 chronic kidney disease, or unspecified chronic kidney disease: Secondary | ICD-10-CM | POA: Diagnosis present

## 2015-09-26 DIAGNOSIS — N189 Chronic kidney disease, unspecified: Secondary | ICD-10-CM | POA: Diagnosis present

## 2015-09-26 DIAGNOSIS — Z7982 Long term (current) use of aspirin: Secondary | ICD-10-CM

## 2015-09-26 DIAGNOSIS — Z79899 Other long term (current) drug therapy: Secondary | ICD-10-CM

## 2015-09-26 DIAGNOSIS — R531 Weakness: Secondary | ICD-10-CM | POA: Diagnosis present

## 2015-09-26 DIAGNOSIS — R197 Diarrhea, unspecified: Secondary | ICD-10-CM | POA: Diagnosis not present

## 2015-09-26 DIAGNOSIS — K5901 Slow transit constipation: Secondary | ICD-10-CM | POA: Diagnosis not present

## 2015-09-26 DIAGNOSIS — M549 Dorsalgia, unspecified: Secondary | ICD-10-CM | POA: Diagnosis present

## 2015-09-26 DIAGNOSIS — R262 Difficulty in walking, not elsewhere classified: Secondary | ICD-10-CM | POA: Diagnosis present

## 2015-09-26 DIAGNOSIS — D696 Thrombocytopenia, unspecified: Secondary | ICD-10-CM | POA: Diagnosis present

## 2015-09-26 DIAGNOSIS — E11649 Type 2 diabetes mellitus with hypoglycemia without coma: Secondary | ICD-10-CM | POA: Diagnosis present

## 2015-09-26 DIAGNOSIS — K5900 Constipation, unspecified: Secondary | ICD-10-CM | POA: Diagnosis present

## 2015-09-26 DIAGNOSIS — R198 Other specified symptoms and signs involving the digestive system and abdomen: Secondary | ICD-10-CM | POA: Insufficient documentation

## 2015-09-26 LAB — POCT I-STAT, CHEM 8
BUN: 23 mg/dL — ABNORMAL HIGH (ref 6–20)
BUN: 27 mg/dL — ABNORMAL HIGH (ref 6–20)
Calcium, Ion: 1.24 mmol/L (ref 1.13–1.30)
Calcium, Ion: 0.95 mmol/L — ABNORMAL LOW (ref 1.13–1.30)
Chloride: 100 mmol/L — ABNORMAL LOW (ref 101–111)
Chloride: 99 mmol/L — ABNORMAL LOW (ref 101–111)
Creatinine, Ser: 1.4 mg/dL — ABNORMAL HIGH (ref 0.61–1.24)
Creatinine, Ser: 1.7 mg/dL — ABNORMAL HIGH (ref 0.61–1.24)
Glucose, Bld: 250 mg/dL — ABNORMAL HIGH (ref 65–99)
Glucose, Bld: 391 mg/dL — ABNORMAL HIGH (ref 65–99)
HCT: 33 % — ABNORMAL LOW (ref 39.0–52.0)
HCT: 32 % — ABNORMAL LOW (ref 39.0–52.0)
Hemoglobin: 11.2 g/dL — ABNORMAL LOW (ref 13.0–17.0)
Hemoglobin: 10.9 g/dL — ABNORMAL LOW (ref 13.0–17.0)
Potassium: 3.6 mmol/L (ref 3.5–5.1)
Potassium: 4.5 mmol/L (ref 3.5–5.1)
Sodium: 138 mmol/L (ref 135–145)
Sodium: 136 mmol/L (ref 135–145)
TCO2: 21 mmol/L (ref 0–100)
TCO2: 19 mmol/L (ref 0–100)

## 2015-09-26 LAB — CBC
HCT: 30.8 % — ABNORMAL LOW (ref 39.0–52.0)
Hemoglobin: 10.5 g/dL — ABNORMAL LOW (ref 13.0–17.0)
MCH: 30.8 pg (ref 26.0–34.0)
MCHC: 34.1 g/dL (ref 30.0–36.0)
MCV: 90.3 fL (ref 78.0–100.0)
Platelets: 90 10*3/uL — ABNORMAL LOW (ref 150–400)
RBC: 3.41 MIL/uL — ABNORMAL LOW (ref 4.22–5.81)
RDW: 15 % (ref 11.5–15.5)
WBC: 4.8 10*3/uL (ref 4.0–10.5)

## 2015-09-26 LAB — BASIC METABOLIC PANEL
Anion gap: 8 (ref 5–15)
BUN: 34 mg/dL — ABNORMAL HIGH (ref 6–20)
CO2: 16 mmol/L — ABNORMAL LOW (ref 22–32)
Calcium: 8.6 mg/dL — ABNORMAL LOW (ref 8.9–10.3)
Chloride: 113 mmol/L — ABNORMAL HIGH (ref 101–111)
Creatinine, Ser: 1.72 mg/dL — ABNORMAL HIGH (ref 0.61–1.24)
GFR calc Af Amer: 44 mL/min — ABNORMAL LOW (ref >60)
GFR calc non Af Amer: 38 mL/min — ABNORMAL LOW (ref >60)
Glucose, Bld: 220 mg/dL — ABNORMAL HIGH (ref 65–99)
Potassium: 3.8 mmol/L (ref 3.5–5.1)
Sodium: 137 mmol/L (ref 135–145)

## 2015-09-26 LAB — GLUCOSE, CAPILLARY
Glucose-Capillary: 198 mg/dL — ABNORMAL HIGH (ref 65–99)
Glucose-Capillary: 199 mg/dL — ABNORMAL HIGH (ref 65–99)
Glucose-Capillary: 100 mg/dL — ABNORMAL HIGH (ref 65–99)
Glucose-Capillary: 137 mg/dL — ABNORMAL HIGH (ref 65–99)

## 2015-09-26 LAB — PROTIME-INR
INR: 1.38 (ref 0.00–1.49)
Prothrombin Time: 17.1 seconds — ABNORMAL HIGH (ref 11.6–15.2)

## 2015-09-26 MED ORDER — POLYETHYLENE GLYCOL 3350 17 G PO PACK: 17.0000 g | PACK | Freq: Two times a day (BID) | ORAL | Status: DC

## 2015-09-26 MED ORDER — BISACODYL 10 MG RE SUPP: 10.0000 mg | Freq: Every day | RECTAL | Status: DC | PRN

## 2015-09-26 MED ORDER — INSULIN GLARGINE 100 UNIT/ML ~~LOC~~ SOLN
50.0000 [IU] | Freq: Every day | SUBCUTANEOUS | Status: DC
  Administered 2015-09-26: 50 [IU] via SUBCUTANEOUS
  Filled 2015-09-26: qty 0.5

## 2015-09-26 MED ORDER — GABAPENTIN 300 MG PO CAPS
600.0000 mg | ORAL_CAPSULE | Freq: Two times a day (BID) | ORAL | Status: DC
  Administered 2015-09-26 – 2015-10-09 (×26): 600 mg via ORAL
  Filled 2015-09-26 (×27): qty 2

## 2015-09-26 MED ORDER — ENOXAPARIN SODIUM 40 MG/0.4ML ~~LOC~~ SOLN
40.0000 mg | SUBCUTANEOUS | Status: DC
  Administered 2015-09-26 – 2015-10-08 (×13): 40 mg via SUBCUTANEOUS
  Filled 2015-09-26 (×13): qty 0.4

## 2015-09-26 MED ORDER — DIPHENHYDRAMINE HCL 12.5 MG/5ML PO ELIX: 12.5000 mg | ORAL_SOLUTION | Freq: Four times a day (QID) | ORAL | Status: DC | PRN

## 2015-09-26 MED ORDER — ATORVASTATIN CALCIUM 40 MG PO TABS
40.0000 mg | ORAL_TABLET | Freq: Every day | ORAL | Status: DC
  Administered 2015-09-27 – 2015-10-08 (×12): 40 mg via ORAL
  Filled 2015-09-26 (×12): qty 1

## 2015-09-26 MED ORDER — PROCHLORPERAZINE MALEATE 5 MG PO TABS: 5.0000 mg | ORAL_TABLET | Freq: Four times a day (QID) | ORAL | Status: DC | PRN

## 2015-09-26 MED ORDER — PANTOPRAZOLE SODIUM 20 MG PO TBEC
20.0000 mg | DELAYED_RELEASE_TABLET | Freq: Every day | ORAL | Status: DC
  Administered 2015-09-27 – 2015-10-09 (×13): 20 mg via ORAL
  Filled 2015-09-26 (×13): qty 1

## 2015-09-26 MED ORDER — ASPIRIN 81 MG PO CHEW
81.0000 mg | CHEWABLE_TABLET | Freq: Every day | ORAL | Status: DC
  Administered 2015-09-26 – 2015-10-08 (×13): 81 mg via ORAL
  Filled 2015-09-26 (×13): qty 1

## 2015-09-26 MED ORDER — INSULIN ASPART 100 UNIT/ML ~~LOC~~ SOLN
0.0000 [IU] | Freq: Three times a day (TID) | SUBCUTANEOUS | Status: DC
  Administered 2015-09-27: 3 [IU] via SUBCUTANEOUS

## 2015-09-26 MED ORDER — SACCHAROMYCES BOULARDII 250 MG PO CAPS
250.0000 mg | ORAL_CAPSULE | Freq: Two times a day (BID) | ORAL | Status: DC
  Administered 2015-09-26 – 2015-10-09 (×26): 250 mg via ORAL
  Filled 2015-09-26 (×27): qty 1

## 2015-09-26 MED ORDER — TRAZODONE HCL 50 MG PO TABS: 50.0000 mg | ORAL_TABLET | Freq: Every evening | ORAL | Status: DC | PRN

## 2015-09-26 MED ORDER — AMLODIPINE BESYLATE 10 MG PO TABS
10.0000 mg | ORAL_TABLET | Freq: Every day | ORAL | Status: DC
  Administered 2015-09-27 – 2015-09-29 (×3): 10 mg via ORAL
  Filled 2015-09-26 (×4): qty 1

## 2015-09-26 MED ORDER — OXYCODONE HCL 5 MG PO TABS: 5.0000 mg | ORAL_TABLET | ORAL | Status: DC | PRN

## 2015-09-26 MED ORDER — METHOCARBAMOL 500 MG PO TABS: 500.0000 mg | ORAL_TABLET | Freq: Four times a day (QID) | ORAL | Status: DC | PRN

## 2015-09-26 MED ORDER — FINASTERIDE 5 MG PO TABS
5.0000 mg | ORAL_TABLET | Freq: Every day | ORAL | Status: DC
  Administered 2015-09-26 – 2015-10-08 (×13): 5 mg via ORAL
  Filled 2015-09-26 (×13): qty 1

## 2015-09-26 MED ORDER — ALPRAZOLAM 0.25 MG PO TABS: 0.2500 mg | ORAL_TABLET | Freq: Every evening | ORAL | Status: DC | PRN

## 2015-09-26 MED ORDER — METOPROLOL TARTRATE 50 MG PO TABS
100.0000 mg | ORAL_TABLET | Freq: Two times a day (BID) | ORAL | Status: DC
  Administered 2015-09-27 – 2015-09-28 (×3): 100 mg via ORAL
  Filled 2015-09-26 (×4): qty 2

## 2015-09-26 MED ORDER — FLEET ENEMA 7-19 GM/118ML RE ENEM: 1.0000 | ENEMA | Freq: Once | RECTAL | Status: DC | PRN

## 2015-09-26 MED ORDER — INSULIN ASPART 100 UNIT/ML ~~LOC~~ SOLN
15.0000 [IU] | Freq: Three times a day (TID) | SUBCUTANEOUS | Status: DC
  Administered 2015-09-27 – 2015-09-28 (×3): 15 [IU] via SUBCUTANEOUS

## 2015-09-26 MED ORDER — PROCHLORPERAZINE EDISYLATE 5 MG/ML IJ SOLN: 5.0000 mg | Freq: Four times a day (QID) | INTRAMUSCULAR | Status: DC | PRN

## 2015-09-26 MED ORDER — ALUM & MAG HYDROXIDE-SIMETH 200-200-20 MG/5ML PO SUSP: 30.0000 mL | ORAL | Status: DC | PRN

## 2015-09-26 MED ORDER — TAMSULOSIN HCL 0.4 MG PO CAPS
0.4000 mg | ORAL_CAPSULE | Freq: Every day | ORAL | Status: DC
  Administered 2015-09-27 – 2015-09-28 (×2): 0.4 mg via ORAL
  Filled 2015-09-26 (×2): qty 1

## 2015-09-26 MED ORDER — ACETAMINOPHEN 325 MG PO TABS
650.0000 mg | ORAL_TABLET | Freq: Four times a day (QID) | ORAL | Status: DC | PRN
  Administered 2015-09-26 – 2015-10-07 (×4): 650 mg via ORAL
  Filled 2015-09-26 (×5): qty 2

## 2015-09-26 MED ORDER — TRAMADOL HCL 50 MG PO TABS
50.0000 mg | ORAL_TABLET | Freq: Four times a day (QID) | ORAL | Status: DC | PRN
  Administered 2015-10-06 – 2015-10-09 (×7): 50 mg via ORAL
  Filled 2015-09-26 (×7): qty 1

## 2015-09-26 MED ORDER — INSULIN GLARGINE 100 UNIT/ML ~~LOC~~ SOLN
50.0000 [IU] | Freq: Every day | SUBCUTANEOUS | Status: DC
  Administered 2015-09-27 – 2015-10-08 (×12): 50 [IU] via SUBCUTANEOUS
  Filled 2015-09-26 (×12): qty 0.5

## 2015-09-26 MED ORDER — POLYETHYLENE GLYCOL 3350 17 G PO PACK
17.0000 g | PACK | Freq: Every day | ORAL | Status: DC | PRN
  Administered 2015-09-27: 17 g via ORAL
  Filled 2015-09-26: qty 1

## 2015-09-26 MED ORDER — INSULIN ASPART 100 UNIT/ML ~~LOC~~ SOLN: 0.0000 [IU] | Freq: Every day | SUBCUTANEOUS | Status: DC

## 2015-09-26 MED ORDER — PROCHLORPERAZINE 25 MG RE SUPP: 12.5000 mg | Freq: Four times a day (QID) | RECTAL | Status: DC | PRN

## 2015-09-26 MED ORDER — POLYSACCHARIDE IRON COMPLEX 150 MG PO CAPS
150.0000 mg | ORAL_CAPSULE | Freq: Two times a day (BID) | ORAL | Status: DC
  Administered 2015-09-27 – 2015-10-08 (×24): 150 mg via ORAL
  Filled 2015-09-26 (×23): qty 1

## 2015-09-26 MED ORDER — GUAIFENESIN-DM 100-10 MG/5ML PO SYRP: 5.0000 mL | ORAL_SOLUTION | Freq: Four times a day (QID) | ORAL | Status: DC | PRN

## 2015-09-26 MED ORDER — POTASSIUM CHLORIDE CRYS ER 10 MEQ PO TBCR
10.0000 meq | EXTENDED_RELEASE_TABLET | Freq: Every day | ORAL | Status: DC
  Administered 2015-09-27 – 2015-10-09 (×13): 10 meq via ORAL
  Filled 2015-09-26 (×13): qty 1

## 2015-09-26 NOTE — Care Management Note (Signed)
Case Management Note  Patient Details  Name: Tanner Mason MRN: [id number] Date of Birth: 1940-11-06  Subjective/Objective:                Spoke with patient and wife in room this am. They understand that he is going to CIR today at discharge.   Plasmaphoresis complete. IV RN finished up renoving catheter while CM in room.    Action/Plan:  Anticipate DC to CIR today.   Expected Discharge Date:                  Expected Discharge Plan:  IP Rehab Facility  In-House Referral:  Clinical Social Work  Discharge planning Services  CM Consult  Post Acute Care Choice:    Choice offered to:     DME Arranged:    DME Agency:     HH Arranged:    HH Agency:     Status of Service:  Completed, signed off  Medicare Important Message Given:  Yes Date Medicare IM Given:    Medicare IM give by:    Date Additional Medicare IM Given:    Additional Medicare Important Message give by:     If discussed at Long Length of Stay Meetings, dates discussed:    Additional Comments:  Tanner SabalDebbie Tameem Pullara, RN 09/26/2015, 11:22 AM

## 2015-09-26 NOTE — Progress Notes (Signed)
Standley BrookingBarbara G Venisha Boehning, RN Rehab Admission Coordinator Signed Physical Medicine and Rehabilitation PMR Pre-admission 09/26/2015 3:12 PM  Related encounter: ED to Hosp-Admission (Discharged) [facility]    Expand All Collapse All   PMR Admission Coordinator Pre-Admission Assessment  Patient: Tanner RueCharles A Kreitzer is an 75 y.o., male MRN: [id number] DOB: 04-Sep-1940 Height: 5\' 7"  (170.2 cm) Weight: 70.7 kg (155 lb 13.8 oz)  Insurance Information HMO: PPO: yes PCP: IPA: 80/20: OTHER: medicare advantage plan PRIMARY: [employer] Medicare Policy#: [id number] Subscriber: pt CM Name: Tanner Mason Phone#: [phone] [phone] approved for 7 days. F/u with [name] at [phone]. EPIC access Pre-Cert#: [id number] Employer: retired Benefits: Phone #: [phone] Name: 09/26/15 Eff. Date: 11/2013 Deduct: none Out of Pocket Max: $4000 Life Max: none CIR: $160 copay per day days 1-10 then covers 100% SNF: no cpay days 1-20; $50 copay per day days 21-100 Outpatient: $20 copay per day Co-Pay: per medical neccessity Home Health: 100% Co-Pay: no limit on visits DME: 80% Co-Pay: 20% Providers: in network  SECONDARY: none  Medicaid Application Date: Case Manager:  Disability Application Date: Case Worker:   Emergency Conservator, museum/galleryContact Information Contact Information    Name Relation Home Work Mobile   Tanner Mason Spouse [phone]  [phone]   Tanner Mason Daughter   [phone]   No name specified         Current Medical History  Patient Admitting Diagnosis: GBS  History of Present Illness: Tanner Mason is a 75 y.o. male with history of DMT2 with neuropathy and gait  disorder who was admitted on 09/17/15 with numbness of feet with worsening balance and progressive weakness. Had been seen in the ED few days before with negative work up but continued to worsen and was found to be areflexic with work up consistent with GBS. MRI lumbar spine with mild to moderate stenosis L4/5 and L5/S1 and LP with elevated glucose, elevated protein and elevated IgG. Cultures negative. He was started on PLEX with improvement in strength with ability to stand--last PLEX yesterday. He had developed angioedema (history of excessive histamines) that improved with prednisone and benadryl. He has had diarrhea in past 24 hours felt to be due to laxatives.   Past Medical History  Past Medical History  Diagnosis Date  . Elbow dislocation [id number]    Right  . Treadmill stress test negative for angina pectoris 06/2002    poor HR and BP recovery  . Abdominal ultrasound, abnormal 02/2004    fatty liver  . Encounter for diagnostic endoscopy 10/22/04    negative  . History of esophagogastroduodenoscopy 08/13/2007    normal  . Diabetes mellitus without complication (HCC)     Family History  family history includes Alzheimer's disease in his mother; Anuerysm in his father; Aortic aneurysm in his brother and father; Diabetes in his brother.  Prior Rehab/Hospitalizations:  Has the patient had major surgery during 100 days prior to admission? No  Current Medications   Current facility-administered medications:  . acetaminophen (TYLENOL) tablet 650 mg, 650 mg, Oral, Q8H, Leighton Roachodd D McDiarmid, MD, 650 mg at 09/26/15 [id number] . ALPRAZolam Prudy Feeler(XANAX) tablet 0.25 mg, 0.25 mg, Oral, QHS PRN, Nestor RampSara L Neal, MD, 0.25 mg at 09/24/15 2147 . amLODipine (NORVASC) tablet 10 mg, 10 mg, Oral, Daily, Ashly M Gottschalk, DO, 10 mg at 09/26/15 1003 . aspirin chewable tablet 81 mg, 81 mg, Oral, QHS, Ashly Hulen SkainsM Gottschalk, DO, 81 mg at 09/25/15 2333 . atorvastatin (LIPITOR)  tablet 40 mg, 40 mg,  Oral, q1800, Raliegh Ip, DO, 40 mg at 09/25/15 1818 . diphenhydrAMINE (BENADRYL) injection 50 mg, 50 mg, Intravenous, Once, Asiyah Mayra Reel, MD, 50 mg at 09/25/15 0715 . finasteride (PROSCAR) tablet 5 mg, 5 mg, Oral, QHS, Ashly M Gottschalk, DO, 5 mg at 09/25/15 2333 . gabapentin (NEURONTIN) capsule 600 mg, 600 mg, Oral, BID, Ashly M Gottschalk, DO, 600 mg at 09/26/15 1004 . insulin aspart (novoLOG) injection 0-15 Units, 0-15 Units, Subcutaneous, TID WC, Campbell Stall, MD, 3 Units at 09/26/15 1345 . insulin aspart (novoLOG) injection 0-5 Units, 0-5 Units, Subcutaneous, QHS, Campbell Stall, MD, 2 Units at 09/25/15 2333 . insulin aspart (novoLOG) injection 15 Units, 15 Units, Subcutaneous, TID WC, Campbell Stall, MD, 15 Units at 09/26/15 1345 . insulin glargine (LANTUS) injection 50 Units, 50 Units, Subcutaneous, Daily, Campbell Stall, MD, 50 Units at 09/26/15 1050 . metoprolol (LOPRESSOR) tablet 100 mg, 100 mg, Oral, BID, Ashly M Gottschalk, DO, 100 mg at 09/26/15 1003 . oxyCODONE (Oxy IR/ROXICODONE) immediate release tablet 5 mg, 5 mg, Oral, Q4H PRN, Leighton Roach McDiarmid, MD, 5 mg at 09/23/15 0836 . pantoprazole (PROTONIX) EC tablet 20 mg, 20 mg, Oral, Daily, Ashly M Gottschalk, DO, 20 mg at 09/26/15 1004 . polyethylene glycol (MIRALAX / GLYCOLAX) packet 17 g, 17 g, Oral, BID, Campbell Stall, MD, 17 g at 09/25/15 0914 . potassium chloride (K-DUR,KLOR-CON) CR tablet 10 mEq, 10 mEq, Oral, Daily, Ashly M Gottschalk, DO, 10 mEq at 09/26/15 1003 . tamsulosin (FLOMAX) capsule 0.4 mg, 0.4 mg, Oral, Daily, Ashly M Gottschalk, DO, 0.4 mg at 09/26/15 1004 . traMADol (ULTRAM) tablet 50 mg, 50 mg, Oral, Q6H PRN, Hillery Hunter Melancon, MD . traZODone (DESYREL) tablet 50 mg, 50 mg, Oral, QHS PRN, Campbell Stall, MD, 50 mg at 09/21/15 2127  Patients Current Diet: Diet heart healthy/carb modified Room service appropriate?: Yes; Fluid consistency:: Thin  Precautions /  Restrictions Precautions Precautions: Fall Precaution Comments: knees will buckle without Upper body support. Restrictions Weight Bearing Restrictions: No   Has the patient had 2 or more falls or a fall with injury in the past year?No  Prior Activity Level Community (5-7x/wk): Independent and driving pta. Works out 4 times per week  Journalist, newspaper / Corporate investment banker Devices/Equipment: None Home Equipment: Crutches, Other (comment), Grab bars - tub/shower  Prior Device Use: Indicate devices/aids used by the patient prior to current illness, exacerbation or injury? None of the above  Prior Functional Level Prior Function Level of Independence: Independent Comments: Very active.  Self Care: Did the patient need help bathing, dressing, using the toilet or eating? Independent  Indoor Mobility: Did the patient need assistance with walking from room to room (with or without device)? Independent  Stairs: Did the patient need assistance with internal or external stairs (with or without device)? Independent  Functional Cognition: Did the patient need help planning regular tasks such as shopping or remembering to take medications? Independent  Current Functional Level Cognition  Overall Cognitive Status: Within Functional Limits for tasks assessed Orientation Level: Oriented X4   Extremity Assessment (includes Sensation/Coordination)  Upper Extremity Assessment: Mason deficits/detail, LUE deficits/detail Mason Deficits / Details: Strength overall 4/5. Proprioception and kinestetic awareness is impaired.  Mason Sensation: decreased proprioception Mason Coordination: decreased fine motor, decreased gross motor LUE Deficits / Details: Strength overall 4/5. Fatigues quickly LUE Sensation: decreased proprioception LUE Coordination: decreased fine motor, decreased gross motor  Lower Extremity Assessment: Defer to PT evaluation RLE Deficits / Details: Hip flex  3+/5,  quads 4/5, df 3/5 RLE Sensation: decreased proprioception, decreased light touch RLE Coordination: decreased gross motor LLE Deficits / Details: Hip flex 3+/5, quads 4/5 df 3-/5 LLE Sensation: decreased light touch, decreased proprioception LLE Coordination: decreased gross motor    ADLs  Overall ADL's : Needs assistance/impaired Eating/Feeding: Set up, Sitting Eating/Feeding Details (indicate cue type and reason): not coordinated but he can do it. Some issues with cutting food. Grooming: Wash/dry hands, Wash/dry face, Oral care, Set up, Sitting Grooming Details (indicate cue type and reason): Pt fatigues quickly. Pt did put shower cap on head and did rinseless hair wash with min assist to get help to finish b/c arms fatigue overhead. Upper Body Bathing: Minimal assitance, Sitting Upper Body Bathing Details (indicate cue type and reason): assist to be thorough Lower Body Bathing: Moderate assistance, Sit to/from stand Lower Body Bathing Details (indicate cue type and reason): mod assist needed to get to his feet and then he holds to walker while therapist washes bottom. Pt locks out his knees in standing or they buckle. Pt is a fall risk in standing. Upper Body Dressing : Supervision/safety, Sitting Upper Body Dressing Details (indicate cue type and reason): front opening gown Lower Body Dressing: Minimal assistance, Maximal assistance, Sitting/lateral leans Lower Body Dressing Details (indicate cue type and reason): donned R sock with min assist, L with max assist crossing foot over opposite knee in sitting Toilet Transfer: Minimal assistance, Requires drop arm, BSC (lateral scoot) Toilet Transfer Details (indicate cue type and reason): Pt laterally scoots very well which gives him some independence with transfers but is unable to stand and transfer to commode without +2 assist due to knees buckling. Toileting- Clothing Manipulation and Hygiene: Minimal assistance, Sitting/lateral  lean Tub/Shower Transfer Details (indicate cue type and reason): discussed transfers. Will see how pt progresses but might be better using tub shower with a bench for safety unless he can become more independent standing. Functional mobility during ADLs: +2 for physical assistance, Maximal assistance, +2 for safety/equipment (rolling B platform walker (EVA)) General ADL Comments: Pt heavily reliant on UEs in standing, unable to perform ADL in standing.    Mobility  Overal bed mobility: Needs Assistance Bed Mobility: Supine to Sit Supine to sit: Min guard, HOB elevated General bed mobility comments: use of bed rail, patient moves quickly ? impulsive or due to lack of motor control.    Transfers  Overall transfer level: Needs assistance Equipment used: Bilateral platform walker (EVA walker) Transfers: Sit to/from Stand Sit to Stand: Max assist, Mod assist, +2 safety/equipment, +2 physical assistance Lateral/Scoot Transfers: Min guard General transfer comment: both knees tend to hyperextend during sit to stand. Lifting assist top power up to stand with mod from bed raised and max from reclier with 2 person aassist for safety. the patient's trunk tends to lean forward once standing.. Knees either in a flexed or hyperextended during stance.Patient reports that he cannot tell where his feet are.    Ambulation / Gait / Stairs / Wheelchair Mobility  Ambulation/Gait Ambulation/Gait assistance: Max assist, Mod assist, +2 safety/equipment, +2 physical assistance ( 3 for following closely with the chair.) Ambulation Distance (Feet): 22 Feet (then 30') Assistive device: Bilateral platform walker (EVA) Gait Pattern/deviations: Staggering left, Decreased dorsiflexion - right, Decreased dorsiflexion - left, Decreased weight shift to right, Decreased weight shift to left, Scissoring, Ataxic General Gait Details: max assist of 2 with a third person following with the recliner. The patient's  trunk again demonstrates forward lean/pushing with legs  behind him. Poor control of the legs during swing with the feet stepping on each other at times. The therapist held the RW to prevent it from rolling forward away from the patient. decreased    Posture / Balance Balance Overall balance assessment: Needs assistance Sitting-balance support: Feet supported Sitting balance-Leahy Scale: Fair Standing balance support: During functional activity, Bilateral upper extremity supported Standing balance-Leahy Scale: Poor Standing balance comment: tends to lean forward onto RW, relies heavily on his upper body. High Level Balance Comments: In standing at RW, facilitated lateral weight shifts and staggered stance weight shifts. Most difficulty with L LE forward.    Special needs/care consideration Skin intact  Bowel mgmt: diarhea since receiving laxatives LBM 5/29 Bladder mgmt: continent /urinal Diabetic mgmt yes   Previous Home Environment Living Arrangements: Spouse/significant other Lives With: Spouse Available Help at Discharge: Family, Available 24 hours/day Type of Home: House Home Layout: One level Home Access: Stairs to enter Entrance Stairs-Rails: None Secretary/administrator of Steps: 4 Bathroom Shower/Tub: Health visitor: Handicapped height Bathroom Accessibility: Yes How Accessible: Accessible via walker Home Care Services: No  Discharge Living Setting Plans for Discharge Living Setting: Patient's home, Lives with (comment) (spouse) Type of Home at Discharge: House Discharge Home Layout: One level Discharge Home Access: Stairs to enter Entrance Stairs-Rails: None Entrance Stairs-Number of Steps: 4 Discharge Bathroom Shower/Tub: Walk-in shower Discharge Bathroom Toilet: Handicapped height Discharge Bathroom Accessibility: Yes How Accessible: Accessible via walker Does the patient have any problems obtaining your  medications?: No  Social/Family/Support Systems Patient Roles: Spouse Contact Information: [name], wife and daughter, michele Anticipated Caregiver: wife Anticipated Caregiver's Contact Information: see above Ability/Limitations of Caregiver: 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, structural Availability: 24/7 Discharge Plan Discussed with Primary Caregiver: Yes Is Caregiver In Agreement with Plan?: Yes Does Caregiver/Family have Issues with Lodging/Transportation while Pt is in Rehab?: No  Goals/Additional Needs Patient/Family Goal for Rehab: Supervision goals with PT and OT Expected length of stay: ELOS 10-14 days Pt/Family Agrees to Admission and willing to participate: Yes Program Orientation Provided & Reviewed with Pt/Caregiver Including Roles & Responsibilities: Yes  Decrease burden of Care through IP rehab admission: n/a  Possible need for SNF placement upon discharge: not anticipated  Patient Condition: This patient's condition remains as documented in the consult dated 09/25/2015, in which the Rehabilitation Physician determined and documented that the patient's condition is appropriate for intensive rehabilitative care in an inpatient rehabilitation facility. Will admit to inpatient rehab today.  Preadmission Screen Completed By: Clois Dupes, 09/26/2015 3:12 PM ______________________________________________________________________  Discussed status with Dr. Riley Kill on 09/26/15 at 1708 and received telephone approval for admission today.  Admission Coordinator: Clois Dupes [id number] Date 09/26/2015          Cosigned by: Ranelle Oyster, MD at 09/26/2015 5:29 PM  Revision History     Date/Time User Provider Type Action   09/26/2015 5:29 PM Ranelle Oyster, MD Physician Cosign   09/26/2015 5:08 PM Standley Brooking, RN Rehab Admission Coordinator Sign

## 2015-09-26 NOTE — Progress Notes (Signed)
Erick Colace, MD Physician Signed Physical Medicine and Rehabilitation Consult Note 09/25/2015 9:46 AM  Related encounter: ED to Hosp-Admission (Discharged) from 09/17/2015 in [facility] 5W MEDICAL    Expand All Collapse All        Physical Medicine and Rehabilitation Consult Reason for Consult: GBS Referring Physician: Dr. Randolm Idol   HPI: Tanner Mason is a 75 y.o. male with history of DMT2 with neuropathy and gait disorder who was admitted on 09/17/15 with numbness of feet with worsening balance and progressive weakness. Had been seen in the ED few days before with negative work up but continued to worsen and was found to be areflexic with work up consistent with GBS. MRI lumbar spine with mild to moderate stenosis L4/5 and L5/S1 and LP with elevated glucose, elevated protein and elevated IgG. Cultures negative. He was started on PLEX with improvement in strength with ability to stand--last PLEX today. Angioedema last night treated with antihistamines and felt to be due to chronic issues . CIR recommended for follow up therapy.   Patient without pain complaints. He's had recurrent angioedema Affecting mouth and throat which occurs even at home. Received prednisone and Benadryl this morning very drowsy from the Benadryl. Review of Systems  Unable to perform ROS: mental acuity  Respiratory: Positive for shortness of breath.  Cardiovascular: Negative for chest pain, palpitations, claudication and leg swelling.  Gastrointestinal: Positive for heartburn.  Genitourinary:   Incontinence  Skin: Negative for rash.  Neurological: Negative for dizziness, tingling and headaches.  Psychiatric/Behavioral: Negative for depression. The patient has insomnia. The patient is not nervous/anxious.     Past Medical History  Diagnosis Date  . Elbow dislocation [id number]    Right  . Treadmill stress test negative for angina pectoris 06/2002    poor HR  and BP recovery  . Abdominal ultrasound, abnormal 02/2004    fatty liver  . Encounter for diagnostic endoscopy 10/22/04    negative  . History of esophagogastroduodenoscopy 08/13/2007    normal  . Diabetes mellitus without complication [facility])     Past Surgical History  Procedure Laterality Date  . Inguinal hernia repair Right 1947  . Anterior cruciate ligament repair  5/98    Left, staph infection complicated  . Blepharoplasty  03/2005    with ptosis repair  . Basal cell carcinoma excision  02/2005    R ear  . Inguinal hernia repair Left 1970    Family History  Problem Relation Age of Onset  . Alzheimer's disease Mother     died age 25  . Aortic aneurysm Father     died age 75  . Anuerysm Father   . Aortic aneurysm Brother     repaired plus AI graft  . Diabetes Brother     Social History: Married. Active and works out 5 days weeks. 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 He reports that he quit smoking about 48 years ago. His smoking use included Cigarettes. He started smoking about 73 years ago. He has a 10 pack-year smoking history. He has never used smokeless tobacco. He reports that he drinks about a beer or mixed drink occasionally. He reports that he does not use illicit drugs.    Allergies  Allergen Reactions  . Ace Inhibitors Other (See Comments)    REACTION: Angioedema  . Calcium Carbonate Antacid Other (See Comments)    REACTION: Angioedema of mouth    Medications Prior to Admission  Medication Sig  Dispense Refill  . amLODipine (NORVASC) 10 MG tablet Take 10 mg by mouth daily.    Marland Kitchen aspirin (BAY[employer]ILDRENS ASPIRIN) 81 MG chewable tablet Chew 81 mg by mouth daily.     Marland Kitchen atorvastatin (LIPITOR) 40 MG tablet TAKE ONE TABLET BY MOUTH ONE TIME DAILY 90 tablet 1  . cetirizine (ZYRTEC) 10 MG tablet Take 10 mg by  mouth every evening.    . diphenhydrAMINE (BENADRYL) 12.5 MG/5ML elixir Take 25 mg by mouth 4 (four) times daily as needed for allergies.    . finasteride (PROSCAR) 5 MG tablet Take 1 tablet (5 mg total) by mouth daily. 90 tablet 1  . gabapentin (NEURONTIN) 600 MG tablet Take 1 tablet (600 mg total) by mouth 3 (three) times daily. 270 tablet 1  . hydrochlorothiazide (HYDRODIURIL) 25 MG tablet TAKE ONE-HALF TABLET BY MOUTH DAILY 45 tablet 1  . insulin lispro (HUMALOG) 100 UNIT/ML KiwkPen Inject 10 units with breakfast, 14 units with lunch, and 14 units with dinner. 15 mL 11  . LANTUS SOLOSTAR 100 UNIT/ML Solostar Pen INJECT 50 UNITS INTO THE SKIN DAILY 5 pen 3  . metoprolol (LOPRESSOR) 100 MG tablet TAKE ONE TABLET BY MOUTH TWICE DAILY 180 tablet 0  . omeprazole (PRILOSEC) 20 MG capsule TAKE ONE CAPSULE BY MOUTH ONE TIME DAILY 90 capsule 1  . ONE TOUCH ULTRA TEST test strip USE TO CHECK BLOOD SUGAR THREE TIMES DAILY 100 each 2  . potassium chloride (K-DUR,KLOR-CON) 10 MEQ tablet TAKE TWO TABLETS BY MOUTH DAILY 180 tablet 1  . tamsulosin (FLOMAX) 0.4 MG CAPS capsule TAKE ONE CAPSULE BY MOUTH ONE TIME DAILY 90 capsule 1  . VICTOZA 18 MG/3ML SOPN INJECT 1.8MG  INTO THE SKIN DAILY 5 pen 5  . BD PEN NEEDLE NANO U/F 32G X 4 MM MISC USE AS INSTRUCTED BY YOUR PHYSICIAN TO CHECK BLOOD SUGAR (Patient not taking: Reported on 09/17/2015) 100 each 1  . doxycycline (VIBRAMYCIN) 50 MG capsule Take 2 capsules (100 mg total) by mouth 2 (two) times daily. (Patient not taking: Reported on 09/17/2015) 20 capsule 0  . EPIPEN 2-PAK 0.3 MG/0.3ML SOAJ injection INJECT 0.3MLS INTO THE MUSLE ONCE 2 Device 1    Home: Home Living Family/patient expects to be discharged to:: Private residence Living Arrangements: Spouse/significant other Available Help at Discharge: Family Type of Home: House Home Access: Stairs to enter Secretary/administrator of  Steps: 4 Entrance Stairs-Rails: None Home Layout: One level Bathroom Shower/Tub: Health visitor: Handicapped height Home Equipment: Crutches, Other (comment), Grab bars - tub/shower  Functional History: Prior Function Level of Independence: Independent Comments: Very active. Functional Status:  Mobility: Bed Mobility Overal bed mobility: Modified Independent Bed Mobility: Supine to Sit Supine to sit: Min assist, HOB elevated General bed mobility comments: increased time and use of bed Transfers Overall transfer level: Needs assistance Equipment used: Rolling walker (2 wheeled) Transfers: Sit to/from Stand Sit to Stand: Min assist, +2 safety/equipment Lateral/Scoot Transfers: Min guard General transfer comment: min guard for safety during lateral scoot EOB to drop arm recliner; assist to achieve erect posture and for balance upon standing to transition hand placement to RW; cues for hand placement and technique Ambulation/Gait Ambulation/Gait assistance: Mod assist, Max assist, +2 safety/equipment Ambulation Distance (Feet): 40 Feet (20X2) Assistive device: Rolling walker (2 wheeled) Gait Pattern/deviations: Ataxic, Trunk flexed General Gait Details: grossly mod A +2 but with need for max A at times especially when pt becomes fatigued; cues for posture, pushing through bilat UE, and forward  gaze; assist to advance bilat LE forward     ADL: ADL Overall ADL's : Needs assistance/impaired Eating/Feeding: Set up, Sitting Eating/Feeding Details (indicate cue type and reason): not coordinated but he can do it. Some issues with cutting food. Grooming: Wash/dry hands, Wash/dry face, Oral care, Set up, Sitting Grooming Details (indicate cue type and reason): Pt fatigues quickly. Pt did put shower cap on head and did rinseless hair wash with min assist to get help to finish b/c arms fatigue overhead. Upper Body Bathing: Minimal assitance, Sitting Upper Body Bathing  Details (indicate cue type and reason): assist to be thorough Lower Body Bathing: Moderate assistance, Sit to/from stand Lower Body Bathing Details (indicate cue type and reason): mod assist needed to get to his feet and then he holds to walker while therapist washes bottom. Pt locks out his knees in standing or they buckle. Pt is a fall risk in standing. Upper Body Dressing : Set up, Sitting Upper Body Dressing Details (indicate cue type and reason): assist with buttons. Lower Body Dressing: Moderate assistance, Sit to/from stand Lower Body Dressing Details (indicate cue type and reason): assist to pull pants up. Pt can stand but has to lock knees out and hold to walker while someone else manages pants in standing. Toilet Transfer: Minimal assistance, Requires drop arm, BSC (lateral scoot) Toilet Transfer Details (indicate cue type and reason): Pt laterally scoots very well which gives him some independence with transfers but is unable to stand and transfer to commode without +2 assist due to knees buckling. Toileting- Clothing Manipulation and Hygiene: Minimal assistance, Sitting/lateral lean Tub/Shower Transfer Details (indicate cue type and reason): discussed transfers. Will see how pt progresses but might be better using tub shower with a bench for safety unless he can become more independent standing. Functional mobility during ADLs: Moderate assistance, +2 for physical assistance, Rolling walker, Cueing for safety, Cueing for sequencing General ADL Comments: Pt able to do most adls safely in sitting but needs a great amount of assist when he is on his feet standing for adls.  Cognition: Cognition Overall Cognitive Status: Within Functional Limits for tasks assessed Orientation Level: Oriented X4 Cognition Arousal/Alertness: Awake/alert Behavior During Therapy: WFL for tasks assessed/performed Overall Cognitive Status: Within Functional Limits for tasks assessed   Blood pressure  126/64, pulse 75, temperature 98.2 F (36.8 C), temperature source Oral, resp. rate 22, height 5\' 7"  (1.702 m), weight 70.7 kg (155 lb 13.8 oz), SpO2 98 %. Physical Exam  Nursing note and vitals reviewed. Constitutional: He appears well-developed and well-nourished. He appears lethargic. He is easily aroused.  HENT:  Head: Normocephalic and atraumatic.  Eyes: Conjunctivae are normal. Pupils are equal, round, and reactive to light.  Neck: Normal range of motion. Neck supple.  Cardiovascular: Normal rate and regular rhythm.  Respiratory: Effort normal and breath sounds normal.  GI: Soft. Bowel sounds are normal. He exhibits no distension. There is no tenderness.  Musculoskeletal: He exhibits no edema or tenderness.  Neurological: He is easily aroused. He appears lethargic.  Skin: Skin is warm and dry.  Motor strength is 4/5 bilateral deltoids, biceps, triceps, grip  3+ hip flexor 45 knee extensor, 2 minus ankle dorsiflexor and plantar flexor, trace toe flexion and extension Sensation intact to light touch and proprioception bilateral lower limbs. Mild ataxia noted in the lumbar upper limbs. Extraocular muscles intact Neuro: Eyes without evidence of nystagmus  Tone is normal without evidence of spasticity   Cranial nerves II- Visual fields are intact to confrontation  testing, no blurring of vision III- no evidence of ptosis, upward, downward and medial gaze intact IV- no vertical diplopia or head tilt V- no facial numbness or masseter weakness VI- no pupil abduction weakness VII- no facial droop, good lid closure VII- normal auditory acuity IX- no pharygeal weakness, gag nl X- no pharyngeal weakness, no hoarseness XI- no trap or SCM weakness XII- no glossal weakness     Lab Results Last 24 Hours    Results for orders placed or performed during the hospital encounter of 09/17/15 (from the past 24 hour(s))  Glucose, capillary Status: Abnormal   Collection Time:  09/24/15 11:37 AM  Result Value Ref Range   Glucose-Capillary 398 (H) 65 - 99 mg/dL  Glucose, capillary Status: Abnormal   Collection Time: 09/24/15 4:36 PM  Result Value Ref Range   Glucose-Capillary 384 (H) 65 - 99 mg/dL  Glucose, capillary Status: Abnormal   Collection Time: 09/24/15 9:34 PM  Result Value Ref Range   Glucose-Capillary 148 (H) 65 - 99 mg/dL   Comment 1 Notify RN    Comment 2 Document in Chart   Basic metabolic panel Status: Abnormal   Collection Time: 09/25/15 3:49 AM  Result Value Ref Range   Sodium 136 135 - 145 mmol/L   Potassium 3.8 3.5 - 5.1 mmol/L   Chloride 104 101 - 111 mmol/L   CO2 23 22 - 32 mmol/L   Glucose, Bld 204 (H) 65 - 99 mg/dL   BUN 22 (H) 6 - 20 mg/dL   Creatinine, Ser 1.611.64 (H) 0.61 - 1.24 mg/dL   Calcium 9.2 8.9 - 09.610.3 mg/dL   GFR calc non Af Amer 40 (L) >60 mL/min   GFR calc Af Amer 46 (L) >60 mL/min   Anion gap 9 5 - 15  CBC Status: Abnormal   Collection Time: 09/25/15 3:49 AM  Result Value Ref Range   WBC 4.6 4.0 - 10.5 K/uL   RBC 3.93 (L) 4.22 - 5.81 MIL/uL   Hemoglobin 11.8 (L) 13.0 - 17.0 g/dL   HCT 04.535.2 (L) 40.939.0 - 81.152.0 %   MCV 89.6 78.0 - 100.0 fL   MCH 30.0 26.0 - 34.0 pg   MCHC 33.5 30.0 - 36.0 g/dL   RDW 91.415.1 78.211.5 - 95.615.5 %   Platelets 96 (L) 150 - 400 K/uL  Glucose, capillary Status: Abnormal   Collection Time: 09/25/15 7:54 AM  Result Value Ref Range   Glucose-Capillary 213 (H) 65 - 99 mg/dL  Protime-INR Status: Abnormal   Collection Time: 09/25/15 9:42 AM  Result Value Ref Range   Prothrombin Time 16.1 (H) 11.6 - 15.2 seconds   INR 1.27 0.00 - 1.49      Imaging Results (Last 48 hours)    Dg Lumbar Spine 2-3 Views  09/23/2015 CLINICAL DATA: Low back pain for 2 days EXAM: LUMBAR SPINE - 2-3 VIEW COMPARISON: None. FINDINGS: There is no  evidence of lumbar spine fracture. Alignment is normal. Degenerative disc disease with disc height loss at L1-2. Bilateral facet arthropathy at L4-5 and L5-S1. Abdominal aortic atherosclerosis. IMPRESSION: No acute osseous injury of the lumbar spine. 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 Does the need for close, 24 hr/day medical supervision in concert with the patient's rehab needs make it unreasonable for this patient to be served in a less intensive setting? Yes 2. Co-Morbidities requiring supervision/potential complications: Angioedema, lumbar spondylosis, diabetes with peripheral neuropathy 3. Due  to bladder management, bowel management, safety, skin/wound care, disease management, medication administration, pain management and patient education, does the patient require 24 hr/day rehab nursing? Yes 4. Does the patient require coordinated care of a physician, rehab nurse, PT (1-2 hrs/day, 5 days/week) and OT (1-2 hrs/day, 5 days/week) to address physical and functional deficits in the context of the above medical diagnosis(es)? Yes Addressing deficits in the following areas: balance, endurance, locomotion, strength, transferring, bowel/bladder control, bathing, dressing, feeding, grooming and toileting 5. Can the patient actively participate in an intensive therapy program of at least 3 hrs of therapy per day at least 5 days per week? Yes 6. The potential for patient to make measurable gains while on inpatient rehab is excellent 7. Anticipated functional outcomes upon discharge from inpatient rehab are modified independent and supervision with PT, modified independent and supervision with OT, n/a with SLP. 8. Estimated rehab length of stay to reach the above functional goals is: 10-14d 9. Does the patient have adequate social supports and living environment to accommodate these discharge  functional goals? Yes 10. Anticipated D/C setting: Home 11. Anticipated post D/C treatments: HH therapy 12. Overall Rehab/Functional Prognosis: excellent  RECOMMENDATIONS: This patient's condition is appropriate for continued rehabilitative care in the following setting: CIR Patient has agreed to participate in recommended program. Yes Note that insurance prior authorization may be required for reimbursement for recommended care.  Comment: We will finish last plasma exchange today    09/25/2015       Revision History     Date/Time User Provider Type Action   09/25/2015 11:08 AM Erick Colace, MD Physician Sign   09/25/2015 10:57 AM Jacquelynn Cree, PA-C Physician Assistant Share   09/25/2015 10:56 AM Jacquelynn Cree, PA-C Physician Assistant Share   View Details Report       Routing History     Date/Time From To Method   09/25/2015 11:08 AM Erick Colace, MD Uvaldo Rising, MD In Basket

## 2015-09-26 NOTE — Progress Notes (Signed)
Pt discharged to CIR room 5. Report given to nurse on unit.

## 2015-09-26 NOTE — Progress Notes (Signed)
Family Medicine Teaching Service Daily Progress Note Intern Pager: [phone]  Patient name: Tanner Mason Medical record number: [id number] Date of birth: 10/24/1940 Age: 75 y.o. Gender: male  Primary Care Provider: Lupita Dawn, MD Consultants: Neurosurgery Code Status: Full  Pt Overview and Major Events to Date:  5/21: Admitted to FMTS with weakness and gait abnormality 5/23: Plasmapheresis Treatment #1 5/24: Plasmapheresis Treatment #2 5/25: Plasmapheresis Treatment #3 5/27: Plasmapheresis Treatment #4 5/29: Developed throat swelling, received Epi x 1, resolved 5/29: Plasmapheresis Treatment #5  Assessment and Plan: Tanner Mason is a 75 y.o. male presenting with weakness/ gait abnormality, hypokalemia . PMH is significant for lumbar radiculopathy affecting LLE, HTN, HLD, DM2 w/ peripheral neuropathy, CKD3, GERD  # Weakness/Gait abnormality 2/2 Guillain Barre: Numbness in thighs and hands has completely resolved. Numbness in his feet is improving.  - Continue telemetry for increased risk for arrhythmias  - S/p PLEX x 5 treatments, Neurology has signed off.  - Will remove central line today. - PT/OT recommending CIR. Waiting for CIR to evaluate.  # DM2 w/ peripheral neuropathy:  CBGs 213-414 in the last 24 hours. The 414 occurred after he received Solumedrol. Last A1c 6.4 (06/2015). On Lantus 50u qd, humalog 02/09/13, and Neurontin 614m TID at home. Has required 25 units of Novolog per sliding scale in the last 24 hours.  - Will increase Lantus from 45 units to 50 units daily. - Novolog 15 units tid with meals. - Moderate SSI - CBG monitoring QAC and HS - Neurontin at [id number]    # CKD3: Cr 1.46 > 1.61 > 1.64 (baseline 1.5-1.8).Continues to appear euvolemic. - Avoid nephrotoxic agents - Daily BMETs  # Low back pain: Chronic problem but worsened by sleeping in hospital bed. Lumbar x-ray (5/27) showed no evidence of lumbar spine fracture, degenerative disc disease  with disc height loss at L1-2. - Encouraged Pt to be out of bed as much as possible during the day - K-pad - Oxycodone 42m17m6hrs prn, Tramadol 6m57mhrs prn  # HTN: BP 111/61 this morning.  Norvasc 10mg3mpressor 100mg,71m HCTZ 12.42mg - 6mding HCTZ  - Continue Lopressor and Norvasc - Monitor BPs  # GERD: on Omeprazole outpatient - Protonix 20mg wh642mhospitalized  # BPH: on Flomax and Proscar outpatient - Continue home meds  # Constipation, resolved: Refractory to Miralax - s/p soap suds enema on 5/27, had 2 BMs  FEN/GI: Heart healthy, carb-mod diet; Protonix Prophylaxis: SCDs  Disposition: Hopeful for discharge to CIR today.  Subjective:  Pt doing well this morning. No concerns except   Objective: Temp:  [97.4 F (36.3 C)-98.6 F (37 C)] 97.8 F (36.6 C) (05/30 0528) Pulse Rate:  [59-75] 67 (05/30 0528) Resp:  [11-20] 17 (05/30 0528) BP: (97-128)/(54-72) 111/61 mmHg (05/30 0528) SpO2:  [98 %-100 %] 98 % (05/30 0528) Physical Exam: General: Sitting up in bed eating breakfast, in NAD HEENT: [place]/AT, EOMI, MMM Neck: IJ in place, no surrounding erythema Cardiovascular: RRR, no murmurs Respiratory: CTAB, normal WOB  Abdomen: +BS, soft, NT/ND Neuro:  5/5 muscle strength in the upper and lower extremities bilaterally, light touch sensation in tact LE Psych: Appropriate affect, normal behavior, mood stable, speech normal  Laboratory:  Recent Labs Lab 09/23/15 0930 09/24/15 0417 09/25/15 0349  WBC 7.5 9.5 4.6  HGB 11.5* 12.1* 11.8*  HCT 33.2* 35.3* 35.2*  PLT 121* 125* 96*    Recent Labs Lab 09/20/15 1557  09/23/15 0930 09/24/15 0417 09/25/15 0349  NA  134*  < > 134* 136 136  K 3.1*  < > 3.5 3.8 3.8  CL 103  < > 103 108 104  CO2 21*  < > 24 21* 23  BUN 29*  < > 21* 24* 22*  CREATININE 1.89*  < > 1.46* 1.61* 1.64*  CALCIUM 8.9  < > 8.8* 8.8* 9.2  PROT 5.9*  --   --   --   --   BILITOT 1.1  --   --   --   --   ALKPHOS 44  --   --   --   --   ALT 21   --   --   --   --   AST 25  --   --   --   --   GLUCOSE 354*  < > 254* 225* 204*  < > = values in this interval not displayed. Mag 1.7 > 1.9 TSH 1.323 Vitamin B12 471  PT 15.0, INR 1.16 UA: no bacteria, trace Hgb, neg ketones, neg leukocytes, no WBCs, no RBCs CRP 0.6 and ESR 25  No results found.  Sela Hua, MD 09/26/2015, 8:19 AM PGY-1, [place] Intern pager: [phone], text pages welcome

## 2015-09-26 NOTE — Progress Notes (Signed)
I await insurance approval to admit pt to inpt rehab today. I will follow up today. [phone] 

## 2015-09-26 NOTE — Progress Notes (Signed)
Pt c/o pain when swallowing and discomfort to throat, where pt has had previous angioedema. No airway obstruction. MD on call notified and reported she would see pt. Will continue to monitor.

## 2015-09-26 NOTE — H&P (View-Only) (Signed)
Physical Medicine and Rehabilitation Admission H&P    Chief Complaint  Patient presents with  . BLE weakness with sensory deficits and inability to walk    HPI:  Tanner Mason is a 75 y.o. male with history of DMT2 with neuropathy and gait disorder who was admitted on 09/17/15 with numbness of feet with worsening balance and progressive weakness. Had been seen in the ED few days before with negative work up but continued to worsen and was found to be areflexic with work up consistent with GBS. MRI lumbar spine with mild to moderate stenosis L4/5 and L5/S1 and LP with elevated glucose, elevated protein and elevated IgG. Cultures negative. He was started on PLEX with improvement in strength with ability to stand--last PLEX yesterday.  He had developed angioedema  (history of excessive histamines) that improved with prednisone and benadryl. He has had diarrhea in past 24 hours felt to be due to laxatives.      Review of Systems  HENT: Negative for hearing loss.   Eyes: Negative for blurred vision and double vision.  Respiratory: Negative for cough and shortness of breath.        Sore throat  Cardiovascular: Negative for chest pain and palpitations.  Gastrointestinal: Negative for heartburn, nausea, abdominal pain, diarrhea and constipation.  Genitourinary: Negative for dysuria and urgency.  Musculoskeletal: Positive for myalgias and back pain (due to lying in bed).  Skin: Negative for itching and rash.  Neurological: Positive for tingling and sensory change. Negative for headaches.  Psychiatric/Behavioral: The patient is not nervous/anxious and does not have insomnia.       Past Medical History  Diagnosis Date  . Elbow dislocation [id number]    Right  . Treadmill stress test negative for angina pectoris 06/2002    poor HR and BP recovery  . Abdominal ultrasound, abnormal 02/2004    fatty liver  . Encounter for diagnostic endoscopy 10/22/04    negative  . History of  esophagogastroduodenoscopy 08/13/2007    normal  . Diabetes mellitus without complication [facility])     Past Surgical History  Procedure Laterality Date  . Inguinal hernia repair Right 1947  . Anterior cruciate ligament repair  5/98    Left, staph infection complicated  . Blepharoplasty  03/2005    with ptosis repair  . Basal cell carcinoma excision  02/2005    R ear  . Inguinal hernia repair Left 1970    Family History  Problem Relation Age of Onset  . Alzheimer's disease Mother     died age 6  . Aortic aneurysm Father     died age 76  . Anuerysm Father   . Aortic aneurysm Brother     repaired plus AI graft  . Diabetes Brother      Social History:  Married. Active and works out 5 days/ week. 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 He reports that he quit smoking about 48 years ago. His smoking use included Cigarettes. He started smoking about 73 years ago. He has a 10 pack-year smoking history. He has never used smokeless tobacco. He reports that he drinks about a beer or mixed drink occasionally. He reports that he does not use illicit drugs.    Allergies  Allergen Reactions  . Ace Inhibitors Other (See Comments)    REACTION: Angioedema  . Calcium Carbonate Antacid Other (See Comments)    REACTION: Angioedema of mouth    Medications Prior to Admission  Medication  Sig Dispense Refill  . amLODipine (NORVASC) 10 MG tablet Take 10 mg by mouth daily.    Marland Kitchen aspirin (BAYER CHILDRENS ASPIRIN) 81 MG chewable tablet Chew 81 mg by mouth daily.      Marland Kitchen atorvastatin (LIPITOR) 40 MG tablet TAKE ONE TABLET BY MOUTH ONE TIME DAILY 90 tablet 1  . cetirizine (ZYRTEC) 10 MG tablet Take 10 mg by mouth every evening.    . diphenhydrAMINE (BENADRYL) 12.5 MG/5ML elixir Take 25 mg by mouth 4 (four) times daily as needed for allergies.    . finasteride (PROSCAR) 5 MG tablet Take 1 tablet (5 mg total) by mouth daily. 90 tablet 1  . gabapentin (NEURONTIN) 600 MG tablet Take  1 tablet (600 mg total) by mouth 3 (three) times daily. 270 tablet 1  . hydrochlorothiazide (HYDRODIURIL) 25 MG tablet TAKE ONE-HALF TABLET BY MOUTH DAILY 45 tablet 1  . insulin lispro (HUMALOG) 100 UNIT/ML KiwkPen Inject 10 units with breakfast, 14 units with lunch, and 14 units with dinner. 15 mL 11  . LANTUS SOLOSTAR 100 UNIT/ML Solostar Pen INJECT 50 UNITS INTO THE SKIN DAILY 5 pen 3  . omeprazole (PRILOSEC) 20 MG capsule TAKE ONE CAPSULE BY MOUTH ONE TIME DAILY 90 capsule 1  . ONE TOUCH ULTRA TEST test strip USE TO CHECK BLOOD SUGAR THREE TIMES DAILY 100 each 2  . potassium chloride (K-DUR,KLOR-CON) 10 MEQ tablet TAKE TWO TABLETS BY MOUTH DAILY 180 tablet 1  . tamsulosin (FLOMAX) 0.4 MG CAPS capsule TAKE ONE CAPSULE BY MOUTH ONE TIME DAILY 90 capsule 1  . VICTOZA 18 MG/3ML SOPN INJECT 1.8MG INTO THE SKIN DAILY 5 pen 5  . BD PEN NEEDLE NANO U/F 32G X 4 MM MISC USE AS INSTRUCTED BY YOUR PHYSICIAN TO CHECK BLOOD SUGAR (Patient not taking: Reported on 09/17/2015) 100 each 1  . doxycycline (VIBRAMYCIN) 50 MG capsule Take 2 capsules (100 mg total) by mouth 2 (two) times daily. (Patient not taking: Reported on 09/17/2015) 20 capsule 0  . EPIPEN 2-PAK 0.3 MG/0.3ML SOAJ injection INJECT 0.3MLS INTO THE MUSLE ONCE 2 Device 1    Home: Home Living Family/patient expects to be discharged to:: Private residence Living Arrangements: Spouse/significant other Available Help at Discharge: Family Type of Home: House Home Access: Stairs to enter Technical brewer of Steps: 4 Entrance Stairs-Rails: None Home Layout: One level Bathroom Shower/Tub: Multimedia programmer: Handicapped height Home Equipment: Crutches, Other (comment), Grab bars - tub/shower   Functional History: Prior Function Level of Independence: Independent Comments: Very active.  Functional Status:  Mobility: Bed Mobility Overal bed mobility: Needs Assistance Bed Mobility: Supine to Sit Supine to sit: Min guard, HOB  elevated General bed mobility comments: use of bed rail, patient moves quickly ? impulsive or due to lack of motor control. Transfers Overall transfer level: Needs assistance Equipment used: Bilateral platform walker (EVA walker) Transfers: Sit to/from Stand Sit to Stand: Max assist, Mod assist, +2 safety/equipment, +2 physical assistance  Lateral/Scoot Transfers: Min guard General transfer comment: both knees tend to hyperextend during sit to stand. Lifting assist top power up to stand with mod from bed raised and max from reclier with 2 person aassist for safety. the patient's  trunk tends to lean forward once standing..  Knees either in a flexed or hyperextended during stance.Patient reports that he cannot tell where his feet are. Ambulation/Gait Ambulation/Gait assistance: Max assist, Mod assist, +2 safety/equipment, +2 physical assistance ( 3 for following closely with the chair.) Ambulation Distance (Feet): 22 Feet (  then 30') Assistive device: Bilateral platform walker (EVA) Gait Pattern/deviations: Staggering left, Decreased dorsiflexion - right, Decreased dorsiflexion - left, Decreased weight shift to right, Decreased weight shift to left, Scissoring, Ataxic General Gait Details: max assist of 2 with  a third person following with the recliner. The patient's trunk again demonstrates forward lean/pushing with legs behind him.  Poor control of the legs during swing with the feet  stepping on each other at times. The therapist held the RW to prevent it from rolling forward away from the patient. decreased    ADL: ADL Overall ADL's : Needs assistance/impaired Eating/Feeding: Set up, Sitting Eating/Feeding Details (indicate cue type and reason): not coordinated but he can do it.  Some issues with cutting food. Grooming: Wash/dry hands, Wash/dry face, Oral care, Set up, Sitting Grooming Details (indicate cue type and reason): Pt fatigues quickly.  Pt did put shower cap on head and did  rinseless hair wash with min assist to get help to finish b/c arms fatigue overhead. Upper Body Bathing: Minimal assitance, Sitting Upper Body Bathing Details (indicate cue type and reason): assist to be thorough Lower Body Bathing: Moderate assistance, Sit to/from stand Lower Body Bathing Details (indicate cue type and reason): mod assist needed to get to his feet and then he holds to walker while therapist washes bottom.  Pt locks out his knees in standing or they buckle.  Pt is a fall risk in standing. Upper Body Dressing : Supervision/safety, Sitting Upper Body Dressing Details (indicate cue type and reason): front opening gown Lower Body Dressing: Minimal assistance, Maximal assistance, Sitting/lateral leans Lower Body Dressing Details (indicate cue type and reason): donned R sock with min assist, L with max assist crossing foot over opposite knee in sitting Toilet Transfer: Minimal assistance, Requires drop arm, BSC (lateral scoot) Toilet Transfer Details (indicate cue type and reason): Pt laterally scoots very well which gives him some independence with transfers but is unable to stand and transfer to commode without +2 assist due to knees buckling. Toileting- Clothing Manipulation and Hygiene: Minimal assistance, Sitting/lateral lean Tub/Shower Transfer Details (indicate cue type and reason): discussed transfers.  Will see how pt progresses but might be better using tub shower with a bench for safety unless he can become more independent standing. Functional mobility during ADLs: +2 for physical assistance, Maximal assistance, +2 for safety/equipment (rolling B platform walker (EVA)) General ADL Comments: Pt heavily reliant on UEs in standing, unable to perform ADL in standing.  Cognition: Cognition Overall Cognitive Status: Within Functional Limits for tasks assessed Orientation Level: Oriented X4 Cognition Arousal/Alertness: Awake/alert Behavior During Therapy: Impulsive Overall  Cognitive Status: Within Functional Limits for tasks assessed   Blood pressure 111/61, pulse 67, temperature 97.8 F (36.6 C), temperature source Oral, resp. rate 17, height '5\' 7"'  (1.702 m), weight 70.7 kg (155 lb 13.8 oz), SpO2 98 %. Physical Exam  Nursing note and vitals reviewed. Constitutional: He is oriented to person, place, and time. He appears well-developed and well-nourished. No distress.  HENT:  Head: Normocephalic and atraumatic.  Eyes: Conjunctivae and EOM are normal. Pupils are equal, round, and reactive to light. No scleral icterus.  Neck: Normal range of motion. Neck supple.  Cardiovascular: Normal rate and regular rhythm.   Respiratory: Effort normal and breath sounds normal. No respiratory distress. He exhibits no tenderness.  GI: Soft. Bowel sounds are normal. He exhibits no distension. There is no tenderness.  Musculoskeletal: He exhibits no edema or tenderness.  Neurological: He is alert and oriented to  person, place, and time.  Speech clear. Mild ptosis on left--chronic. Cognitively intact.  Motor strength is 4/5 bilateral deltoids, biceps, triceps, grip  3+ to 4/5 hip flexosr 4/5 knee extensor, 2 minus ankle dorsiflexor and 3-4/5 plantar flexors Sensation decreased to LT/Pain below knees bilaterally (1/2)   Skin: Skin is warm and dry. He is not diaphoretic.  Psychiatric: He has a normal mood and affect. His behavior is normal. Judgment and thought content normal.    Results for orders placed or performed during the hospital encounter of 09/17/15 (from the past 48 hour(s))  Glucose, capillary     Status: Abnormal   Collection Time: 09/24/15  4:36 PM  Result Value Ref Range   Glucose-Capillary 384 (H) 65 - 99 mg/dL  Glucose, capillary     Status: Abnormal   Collection Time: 09/24/15  9:34 PM  Result Value Ref Range   Glucose-Capillary 148 (H) 65 - 99 mg/dL   Comment 1 Notify RN    Comment 2 Document in Chart   Basic metabolic panel     Status: Abnormal    Collection Time: 09/25/15  3:49 AM  Result Value Ref Range   Sodium 136 135 - 145 mmol/L   Potassium 3.8 3.5 - 5.1 mmol/L   Chloride 104 101 - 111 mmol/L   CO2 23 22 - 32 mmol/L   Glucose, Bld 204 (H) 65 - 99 mg/dL   BUN 22 (H) 6 - 20 mg/dL   Creatinine, Ser 1.64 (H) 0.61 - 1.24 mg/dL   Calcium 9.2 8.9 - 10.3 mg/dL   GFR calc non Af Amer 40 (L) >60 mL/min   GFR calc Af Amer 46 (L) >60 mL/min    Comment: (NOTE) The eGFR has been calculated using the CKD EPI equation. This calculation has not been validated in all clinical situations. eGFR's persistently <60 mL/min signify possible Chronic Kidney Disease.    Anion gap 9 5 - 15  CBC     Status: Abnormal   Collection Time: 09/25/15  3:49 AM  Result Value Ref Range   WBC 4.6 4.0 - 10.5 K/uL   RBC 3.93 (L) 4.22 - 5.81 MIL/uL   Hemoglobin 11.8 (L) 13.0 - 17.0 g/dL   HCT 35.2 (L) 39.0 - 52.0 %   MCV 89.6 78.0 - 100.0 fL   MCH 30.0 26.0 - 34.0 pg   MCHC 33.5 30.0 - 36.0 g/dL   RDW 15.1 11.5 - 15.5 %   Platelets 96 (L) 150 - 400 K/uL    Comment: SPECIMEN CHECKED FOR CLOTS REPEATED TO VERIFY PLATELET COUNT CONFIRMED BY SMEAR   Glucose, capillary     Status: Abnormal   Collection Time: 09/25/15  7:54 AM  Result Value Ref Range   Glucose-Capillary 213 (H) 65 - 99 mg/dL  Protime-INR     Status: Abnormal   Collection Time: 09/25/15  9:42 AM  Result Value Ref Range   Prothrombin Time 16.1 (H) 11.6 - 15.2 seconds   INR 1.27 0.00 - 1.49  Glucose, capillary     Status: Abnormal   Collection Time: 09/25/15 11:54 AM  Result Value Ref Range   Glucose-Capillary 414 (H) 65 - 99 mg/dL   Comment 1 Notify RN   Glucose, capillary     Status: Abnormal   Collection Time: 09/25/15  4:54 PM  Result Value Ref Range   Glucose-Capillary 255 (H) 65 - 99 mg/dL  Glucose, capillary     Status: Abnormal   Collection Time: 09/25/15 10:05 PM  Result Value Ref Range   Glucose-Capillary 250 (H) 65 - 99 mg/dL  Basic metabolic panel     Status: Abnormal     Collection Time: 09/26/15  7:46 AM  Result Value Ref Range   Sodium 137 135 - 145 mmol/L   Potassium 3.8 3.5 - 5.1 mmol/L   Chloride 113 (H) 101 - 111 mmol/L   CO2 16 (L) 22 - 32 mmol/L   Glucose, Bld 220 (H) 65 - 99 mg/dL   BUN 34 (H) 6 - 20 mg/dL   Creatinine, Ser 1.72 (H) 0.61 - 1.24 mg/dL   Calcium 8.6 (L) 8.9 - 10.3 mg/dL   GFR calc non Af Amer 38 (L) >60 mL/min   GFR calc Af Amer 44 (L) >60 mL/min    Comment: (NOTE) The eGFR has been calculated using the CKD EPI equation. This calculation has not been validated in all clinical situations. eGFR's persistently <60 mL/min signify possible Chronic Kidney Disease.    Anion gap 8 5 - 15  CBC     Status: Abnormal   Collection Time: 09/26/15  7:46 AM  Result Value Ref Range   WBC 4.8 4.0 - 10.5 K/uL   RBC 3.41 (L) 4.22 - 5.81 MIL/uL   Hemoglobin 10.5 (L) 13.0 - 17.0 g/dL   HCT 30.8 (L) 39.0 - 52.0 %   MCV 90.3 78.0 - 100.0 fL   MCH 30.8 26.0 - 34.0 pg   MCHC 34.1 30.0 - 36.0 g/dL   RDW 15.0 11.5 - 15.5 %   Platelets 90 (L) 150 - 400 K/uL    Comment: CONSISTENT WITH PREVIOUS RESULT  Protime-INR     Status: Abnormal   Collection Time: 09/26/15  7:46 AM  Result Value Ref Range   Prothrombin Time 17.1 (H) 11.6 - 15.2 seconds   INR 1.38 0.00 - 1.49  Glucose, capillary     Status: Abnormal   Collection Time: 09/26/15  8:23 AM  Result Value Ref Range   Glucose-Capillary 198 (H) 65 - 99 mg/dL  Glucose, capillary     Status: Abnormal   Collection Time: 09/26/15 12:17 PM  Result Value Ref Range   Glucose-Capillary 199 (H) 65 - 99 mg/dL   No results found.     Medical Problem List and Plan: 1.  Functional and mobility deficits secondary to Guillain Barre Syndrome  -admit to inpatient rehab  -Neurology following along. Consider IVIG if patient doesn't continue to progress 2.  DVT Prophylaxis/Anticoagulation: Pharmaceutical: Lovenox 3. Pain Management: on Neurontin effective as symptoms improving.  4. Mood: LCSW to follow  for evaluation and support.  5. Neuropsych: This patient is capable of making decisions on his own behalf. 6. Skin/Wound Care: Routine pressure relief measures.  7. Fluids/Electrolytes/Nutrition: Encourage po fluid intake.   8. T2DM with peripheral neuropathy:  Uncontrolled --Hgb A1c-7.4. On lantus 50 units with meal coverage tid. Will adjust depending on trend 9. Histamine excess: Monitor for recurrence of angioedema.  Continue to use benadryl/prednisone prn 10. Normocytic anemia: Add iron supplement 11. HTN: Monitor BP bid and adjust medications as indicated. Continue metoprolol bid and norvasc.  12. Acute on chronic renal failure: Encourage po fluids. 13. Diarrhea: Due to laxatives --one episode only today.     Post Admission Physician Evaluation: 1. Functional deficits secondary  to GBS. 2. Patient is admitted to receive collaborative, interdisciplinary care between the physiatrist, rehab nursing staff, and therapy team. 3. Patient's level of medical complexity and substantial therapy needs in context of that medical  necessity cannot be provided at a lesser intensity of care such as a SNF. 4. Patient has experienced substantial functional loss from his/her baseline which was documented above under the "Functional History" and "Functional Status" headings.  Judging by the patient's diagnosis, physical exam, and functional history, the patient has potential for functional progress which will result in measurable gains while on inpatient rehab.  These gains will be of substantial and practical use upon discharge  in facilitating mobility and self-care at the household level. 5. Physiatrist will provide 24 hour management of medical needs as well as oversight of the therapy plan/treatment and provide guidance as appropriate regarding the interaction of the two. 6. 24 hour rehab nursing will assist with bladder management, bowel management, safety, skin/wound care, disease management, medication  administration, pain management and patient education  and help integrate therapy concepts, techniques,education, etc. 7. PT will assess and treat for/with: Lower extremity strength, range of motion, stamina, balance, functional mobility, safety, adaptive techniques and equipment, NMR, proprioception, family ed.   Goals are: mod I. 8. OT will assess and treat for/with: ADL's, functional mobility, safety, upper extremity strength, adaptive techniques and equipment, NMR, proprioception, family education.   Goals are: mod I. Therapy may proceed with showering this patient. 9. SLP will assess and treat for/with: n/a.  Goals are: n/a. 10. Case Management and Social Worker will assess and treat for psychological issues and discharge planning. 11. Team conference will be held weekly to assess progress toward goals and to determine barriers to discharge. 12. Patient will receive at least 3 hours of therapy per day at least 5 days per week. 13. ELOS: 10-17 days       14. Prognosis:  excellent     Meredith Staggers, MD, [facility] 09/26/2015   09/26/2015

## 2015-09-26 NOTE — Progress Notes (Signed)
Physical Therapy Treatment Patient Details Name: Tanner Mason MRN: [id number] DOB: 24-Aug-1940 Today's Date: 09/26/2015    History of Present Illness Tanner Mason is an 75 y.o. male patient had abrupt onset of numbness and tingling in his b/l hands on Saturday in addition to numbness in his feet bilaterally. He has known diabetic neuropathy but this was different. Marland Kitchen He was evaluated at an outside hospital who imaged his brain and cervical spine. Reports found  no acute processes and discharged him home. HE has also had a MRI of brain and cervical spine  and he was told they were normal. Due to worsening balance issues and weakness he came to ED at [facility]. Three days after the initial start date he now cannot stand on his own due to significant weakness of his legs. He also has worsening numbness in his legs. HE has no reflexes in his Knees and states he usually does. He is being treated for Guillain-Barre .    PT Comments    The patient is very motivated, continues  To demonstrate  Impaired  Leg sensation and decreased control of legs during ambulation. Patient requires strong support of the upper body  To stand and ambulate. The patient will benefit from CIR .  Follow Up Recommendations  CIR     Equipment Recommendations       Recommendations for Other Services Rehab consult     Precautions / Restrictions Precautions Precautions: Fall Precaution Comments: knees will buckle without Upper body support. Restrictions Weight Bearing Restrictions: No    Mobility  Bed Mobility Overal bed mobility: Needs Assistance Bed Mobility: Supine to Sit     Supine to sit: Min guard;HOB elevated     General bed mobility comments: use of bed rail, patient moves quickly ? impulsive or due to lack of motor control.  Transfers Overall transfer level: Needs assistance Equipment used: Bilateral platform walker (EVA walker) Transfers: Sit to/from Stand Sit to Stand: Max assist;Mod  assist;+2 safety/equipment;+2 physical assistance         General transfer comment: both knees tend to hyperextend during sit to stand. Lifting assist top power up to stand with mod from bed raised and max from reclier with 2 person aassist for safety. the patient's  trunk tends to lean forward once standing..  Knees either in a flexed or hyperextended during stance.Patient reports that he cannot tell where his feet are.  Ambulation/Gait Ambulation/Gait assistance: Max assist;Mod assist;+2 safety/equipment;+2 physical assistance ( 3 for following closely with the chair.) Ambulation Distance (Feet): 22 Feet (then 30') Assistive device: Bilateral platform walker (EVA) Gait Pattern/deviations: Staggering left;Decreased dorsiflexion - right;Decreased dorsiflexion - left;Decreased weight shift to right;Decreased weight shift to left;Scissoring;Ataxic     General Gait Details: max assist of 2 with  a third person following with the recliner. The patient's trunk again demonstrates forward lean/pushing with legs behind him.  Poor control of the legs during swing with the feet  stepping on each other at times. The therapist held the RW to prevent it from rolling forward away from the patient. decreased   Stairs            Wheelchair Mobility    Modified Rankin (Stroke Patients Only)       Balance     Sitting balance-Leahy Scale: Fair     Standing balance support: During functional activity;Bilateral upper extremity supported Standing balance-Leahy Scale: Poor Standing balance comment: tends to lean forward onto RW, relies heavily on  his upper body.  Cognition Arousal/Alertness: Awake/alert Behavior During Therapy: Impulsive Overall Cognitive Status: Within Functional Limits for tasks assessed                      Exercises  instructed in passive stretch of the ankles  Using a sheet.    General Comments        Pertinent Vitals/Pain Pain  Assessment: No/denies pain    Home Living                      Prior Function            PT Goals (current goals can now be found in the care plan section) Acute Rehab PT Goals Patient Stated Goal: be able to walk again Progress towards PT goals: Progressing toward goals    Frequency  Min 4X/week    PT Plan      Co-evaluation PT/OT/SLP Co-Evaluation/Treatment: Yes Reason for Co-Treatment: For patient/therapist safety;Complexity of the patient's impairments (multi-system involvement) PT goals addressed during session: Mobility/safety with mobility;Balance OT goals addressed during session: ADL's and self-care     End of Session Equipment Utilized During Treatment: Gait belt Activity Tolerance: Patient tolerated treatment well Patient left: in chair;with call bell/phone within reach;with family/visitor present     Time: 0925-1001 PT Time Calculation (min) (ACUTE ONLY): 36 min  Charges:  $Gait Training: 8-22 mins                    G CodesBlanchard Kelch:     Lorina Duffner PT [phone]  Rada HayHill, Camrie Stock Elizabeth 09/26/2015, 10:26 AM

## 2015-09-26 NOTE — Progress Notes (Signed)
Patient arrived to unit with family at [id number] from [address]. Oriented both patient and family of Rehab routine and safety precautions discussed. Patient verbalized understanding of use of call bell and assured that he will call for assistance if needing to get out of bed. No further questions at this time. Alwaleed Obeso, Phill MutterMelissa Rebecca

## 2015-09-26 NOTE — PMR Pre-admission (Signed)
PMR Admission Coordinator Pre-Admission Assessment  Patient: Tanner Mason is an 75 y.o., male MRN: [id number] DOB: 1940/05/07 Height: 5\' 7"  (170.2 cm) Weight: 70.7 kg (155 lb 13.8 oz)              Insurance Information HMO:     PPO: yes     PCP:      IPA:      80/20:      OTHER: medicare advantage plan PRIMARY: [employer] Medicare      Policy#: [id number]     Subscriber: pt CM Name: Misty Stanley      Phone#: [phone]     Fax#: [phone] approved for 7 days. F/u with Penelope Galas at [phone]. EPIC access Pre-Cert#: [id number]      Employer: retired Benefits:  Phone #: [phone]     Name: 09/26/15 Eff. Date: 11/2013     Deduct: none      Out of Pocket Max: $4000      Life Max: none CIR: $160 copay per day days 1-10 then covers 100%      SNF: no cpay days 1-20; $50 copay per day days 21-100 Outpatient: $20 copay per day     Co-Pay: per medical neccessity Home Health: 100%      Co-Pay: no limit on visits DME: 80%     Co-Pay: 20% Providers: in network  SECONDARY: none       Medicaid Application Date:       Case Manager:  Disability Application Date:       Case Worker:   Emergency Conservator, museum/gallery Information    Name Relation Home Work Mobile   [place] Spouse [phone]  [phone]   Thora Lance Daughter   [phone]   No name specified         Current Medical History  Patient Admitting Diagnosis: GBS  History of Present Illness: Tanner Mason is a 75 y.o. male with history of DMT2 with neuropathy and gait disorder who was admitted on 09/17/15 with numbness of feet with worsening balance and progressive weakness. Had been seen in the ED few days before with negative work up but continued to worsen and was found to be areflexic with work up consistent with GBS. MRI lumbar spine with mild to moderate stenosis L4/5 and L5/S1 and LP with elevated glucose, elevated protein and elevated IgG. Cultures negative. He was started on PLEX with  improvement in strength with ability to stand--last PLEX yesterday. He had developed angioedema (history of excessive histamines) that improved with prednisone and benadryl. He has had diarrhea in past 24 hours felt to be due to laxatives.   Past Medical History  Past Medical History  Diagnosis Date  . Elbow dislocation [id number]    Right  . Treadmill stress test negative for angina pectoris 06/2002    poor HR and BP recovery  . Abdominal ultrasound, abnormal 02/2004    fatty liver  . Encounter for diagnostic endoscopy 10/22/04    negative  . History of esophagogastroduodenoscopy 08/13/2007    normal  . Diabetes mellitus without complication (HCC)     Family History  family history includes Alzheimer's disease in his mother; Anuerysm in his father; Aortic aneurysm in his brother and father; Diabetes in his brother.  Prior Rehab/Hospitalizations:  Has the patient had major surgery during 100 days prior to admission? No  Current Medications   Current facility-administered medications:  .  acetaminophen (TYLENOL) tablet 650 mg, 650 mg, Oral, Q8H, Leighton Roach McDiarmid, MD, 650  mg at 09/26/15 0619 .  ALPRAZolam Prudy Feeler(XANAX) tablet 0.25 mg, 0.25 mg, Oral, QHS PRN, Nestor RampSara L Neal, MD, 0.25 mg at 09/24/15 2147 .  amLODipine (NORVASC) tablet 10 mg, 10 mg, Oral, Daily, Ashly M Gottschalk, DO, 10 mg at 09/26/15 1003 .  aspirin chewable tablet 81 mg, 81 mg, Oral, QHS, Ashly Hulen SkainsM Gottschalk, DO, 81 mg at 09/25/15 2333 .  atorvastatin (LIPITOR) tablet 40 mg, 40 mg, Oral, q1800, Ashly M Gottschalk, DO, 40 mg at 09/25/15 1818 .  diphenhydrAMINE (BENADRYL) injection 50 mg, 50 mg, Intravenous, Once, Asiyah Mayra ReelZahra Mikell, MD, 50 mg at 09/25/15 0715 .  finasteride (PROSCAR) tablet 5 mg, 5 mg, Oral, QHS, Ashly M Gottschalk, DO, 5 mg at 09/25/15 2333 .  gabapentin (NEURONTIN) capsule 600 mg, 600 mg, Oral, BID, Ashly M Gottschalk, DO, 600 mg at 09/26/15 1004 .  insulin aspart (novoLOG) injection 0-15 Units, 0-15 Units,  Subcutaneous, TID WC, Campbell StallKaty Dodd Mayo, MD, 3 Units at 09/26/15 1345 .  insulin aspart (novoLOG) injection 0-5 Units, 0-5 Units, Subcutaneous, QHS, Campbell StallKaty Dodd Mayo, MD, 2 Units at 09/25/15 2333 .  insulin aspart (novoLOG) injection 15 Units, 15 Units, Subcutaneous, TID WC, Campbell StallKaty Dodd Mayo, MD, 15 Units at 09/26/15 1345 .  insulin glargine (LANTUS) injection 50 Units, 50 Units, Subcutaneous, Daily, Campbell StallKaty Dodd Mayo, MD, 50 Units at 09/26/15 1050 .  metoprolol (LOPRESSOR) tablet 100 mg, 100 mg, Oral, BID, Ashly M Gottschalk, DO, 100 mg at 09/26/15 1003 .  oxyCODONE (Oxy IR/ROXICODONE) immediate release tablet 5 mg, 5 mg, Oral, Q4H PRN, Leighton Roachodd D McDiarmid, MD, 5 mg at 09/23/15 0836 .  pantoprazole (PROTONIX) EC tablet 20 mg, 20 mg, Oral, Daily, Ashly M Gottschalk, DO, 20 mg at 09/26/15 1004 .  polyethylene glycol (MIRALAX / GLYCOLAX) packet 17 g, 17 g, Oral, BID, Campbell StallKaty Dodd Mayo, MD, 17 g at 09/25/15 0914 .  potassium chloride (K-DUR,KLOR-CON) CR tablet 10 mEq, 10 mEq, Oral, Daily, Ashly M Gottschalk, DO, 10 mEq at 09/26/15 1003 .  tamsulosin (FLOMAX) capsule 0.4 mg, 0.4 mg, Oral, Daily, Ashly M Gottschalk, DO, 0.4 mg at 09/26/15 1004 .  traMADol (ULTRAM) tablet 50 mg, 50 mg, Oral, Q6H PRN, Hillery Hunteraleb G Melancon, MD .  traZODone (DESYREL) tablet 50 mg, 50 mg, Oral, QHS PRN, Campbell StallKaty Dodd Mayo, MD, 50 mg at 09/21/15 2127  Patients Current Diet: Diet heart healthy/carb modified Room service appropriate?: Yes; Fluid consistency:: Thin  Precautions / Restrictions Precautions Precautions: Fall Precaution Comments: knees will buckle without Upper body support. Restrictions Weight Bearing Restrictions: No   Has the patient had 2 or more falls or a fall with injury in the past year?No  Prior Activity Level Community (5-7x/wk): Independent and driving pta. Works out 4 times per week  Journalist, newspaperHome Assistive Devices / Corporate investment bankerquipment Home Assistive Devices/Equipment: None Home Equipment: Crutches, Other (comment), Grab bars -  tub/shower  Prior Device Use: Indicate devices/aids used by the patient prior to current illness, exacerbation or injury? None of the above  Prior Functional Level Prior Function Level of Independence: Independent Comments: Very active.  Self Care: Did the patient need help bathing, dressing, using the toilet or eating?  Independent  Indoor Mobility: Did the patient need assistance with walking from room to room (with or without device)? Independent  Stairs: Did the patient need assistance with internal or external stairs (with or without device)? Independent  Functional Cognition: Did the patient need help planning regular tasks such as shopping or remembering to take medications? Independent  Current Functional  Level Cognition  Overall Cognitive Status: Within Functional Limits for tasks assessed Orientation Level: Oriented X4    Extremity Assessment (includes Sensation/Coordination)  Upper Extremity Assessment: RUE deficits/detail, LUE deficits/detail RUE Deficits / Details: Strength overall 4/5.  Proprioception and kinestetic awareness is impaired.   RUE Sensation: decreased proprioception RUE Coordination: decreased fine motor, decreased gross motor LUE Deficits / Details: Strength overall 4/5.  Fatigues quickly LUE Sensation: decreased proprioception LUE Coordination: decreased fine motor, decreased gross motor  Lower Extremity Assessment: Defer to PT evaluation RLE Deficits / Details: Hip flex 3+/5, quads 4/5, df 3/5 RLE Sensation: decreased proprioception, decreased light touch RLE Coordination: decreased gross motor LLE Deficits / Details: Hip flex 3+/5, quads 4/5 df 3-/5 LLE Sensation: decreased light touch, decreased proprioception LLE Coordination: decreased gross motor    ADLs  Overall ADL's : Needs assistance/impaired Eating/Feeding: Set up, Sitting Eating/Feeding Details (indicate cue type and reason): not coordinated but he can do it.  Some issues with  cutting food. Grooming: Wash/dry hands, Wash/dry face, Oral care, Set up, Sitting Grooming Details (indicate cue type and reason): Pt fatigues quickly.  Pt did put shower cap on head and did rinseless hair wash with min assist to get help to finish b/c arms fatigue overhead. Upper Body Bathing: Minimal assitance, Sitting Upper Body Bathing Details (indicate cue type and reason): assist to be thorough Lower Body Bathing: Moderate assistance, Sit to/from stand Lower Body Bathing Details (indicate cue type and reason): mod assist needed to get to his feet and then he holds to walker while therapist washes bottom.  Pt locks out his knees in standing or they buckle.  Pt is a fall risk in standing. Upper Body Dressing : Supervision/safety, Sitting Upper Body Dressing Details (indicate cue type and reason): front opening gown Lower Body Dressing: Minimal assistance, Maximal assistance, Sitting/lateral leans Lower Body Dressing Details (indicate cue type and reason): donned R sock with min assist, L with max assist crossing foot over opposite knee in sitting Toilet Transfer: Minimal assistance, Requires drop arm, BSC (lateral scoot) Toilet Transfer Details (indicate cue type and reason): Pt laterally scoots very well which gives him some independence with transfers but is unable to stand and transfer to commode without +2 assist due to knees buckling. Toileting- Clothing Manipulation and Hygiene: Minimal assistance, Sitting/lateral lean Tub/Shower Transfer Details (indicate cue type and reason): discussed transfers.  Will see how pt progresses but might be better using tub shower with a bench for safety unless he can become more independent standing. Functional mobility during ADLs: +2 for physical assistance, Maximal assistance, +2 for safety/equipment (rolling B platform walker (EVA)) General ADL Comments: Pt heavily reliant on UEs in standing, unable to perform ADL in standing.    Mobility  Overal bed  mobility: Needs Assistance Bed Mobility: Supine to Sit Supine to sit: Min guard, HOB elevated General bed mobility comments: use of bed rail, patient moves quickly ? impulsive or due to lack of motor control.    Transfers  Overall transfer level: Needs assistance Equipment used: Bilateral platform walker (EVA walker) Transfers: Sit to/from Stand Sit to Stand: Max assist, Mod assist, +2 safety/equipment, +2 physical assistance  Lateral/Scoot Transfers: Min guard General transfer comment: both knees tend to hyperextend during sit to stand. Lifting assist top power up to stand with mod from bed raised and max from reclier with 2 person aassist for safety. the patient's  trunk tends to lean forward once standing..  Knees either in a flexed or hyperextended during stance.Patient  reports that he cannot tell where his feet are.    Ambulation / Gait / Stairs / Wheelchair Mobility  Ambulation/Gait Ambulation/Gait assistance: Max assist, Mod assist, +2 safety/equipment, +2 physical assistance ( 3 for following closely with the chair.) Ambulation Distance (Feet): 22 Feet (then 30') Assistive device: Bilateral platform walker (EVA) Gait Pattern/deviations: Staggering left, Decreased dorsiflexion - right, Decreased dorsiflexion - left, Decreased weight shift to right, Decreased weight shift to left, Scissoring, Ataxic General Gait Details: max assist of 2 with  a third person following with the recliner. The patient's trunk again demonstrates forward lean/pushing with legs behind him.  Poor control of the legs during swing with the feet  stepping on each other at times. The therapist held the RW to prevent it from rolling forward away from the patient. decreased    Posture / Balance Balance Overall balance assessment: Needs assistance Sitting-balance support: Feet supported Sitting balance-Leahy Scale: Fair Standing balance support: During functional activity, Bilateral upper extremity  supported Standing balance-Leahy Scale: Poor Standing balance comment: tends to lean forward onto RW, relies heavily on  his upper body. High Level Balance Comments: In standing at RW, facilitated lateral weight shifts and staggered stance weight shifts.  Most difficulty with L LE forward.    Special needs/care consideration Skin intact                              Bowel mgmt: diarhea since receiving laxatives LBM 5/29 Bladder mgmt: continent /urinal Diabetic mgmt yes   Previous Home Environment Living Arrangements: Spouse/significant other  Lives With: Spouse Available Help at Discharge: Family, Available 24 hours/day Type of Home: House Home Layout: One level Home Access: Stairs to enter Entrance Stairs-Rails: None Secretary/administrator of Steps: 4 Bathroom Shower/Tub: Health visitor: Handicapped height Bathroom Accessibility: Yes How Accessible: Accessible via walker Home Care Services: No  Discharge Living Setting Plans for Discharge Living Setting: Patient's home, Lives with (comment) (spouse) Type of Home at Discharge: House Discharge Home Layout: One level Discharge Home Access: Stairs to enter Entrance Stairs-Rails: None Entrance Stairs-Number of Steps: 4 Discharge Bathroom Shower/Tub: Walk-in shower Discharge Bathroom Toilet: Handicapped height Discharge Bathroom Accessibility: Yes How Accessible: Accessible via walker Does the patient have any problems obtaining your medications?: No  Social/Family/Support Systems Patient Roles: Spouse Contact Information: [name], wife and daughter, michele Anticipated Caregiver: wife Anticipated Caregiver's Contact Information: see above Ability/Limitations of Caregiver: 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, structural Availability: 24/7 Discharge Plan Discussed with Primary Caregiver: Yes Is Caregiver In Agreement with Plan?: Yes Does Caregiver/Family have Issues with Lodging/Transportation while Pt is in Rehab?:  No  Goals/Additional Needs Patient/Family Goal for Rehab: Supervision goals with PT and OT Expected length of stay: ELOS 10-14 days Pt/Family Agrees to Admission and willing to participate: Yes Program Orientation Provided & Reviewed with Pt/Caregiver Including Roles  & Responsibilities: Yes  Decrease burden of Care through IP rehab admission: n/a  Possible need for SNF placement upon discharge: not anticipated  Patient Condition: This patient's condition remains as documented in the consult dated 09/25/2015, in which the Rehabilitation Physician determined and documented that the patient's condition is appropriate for intensive rehabilitative care in an inpatient rehabilitation facility. Will admit to inpatient rehab today.  Preadmission Screen Completed By:  Clois Dupes, 09/26/2015 3:12 PM ______________________________________________________________________   Discussed status with Dr. Riley Kill on 09/26/15 at 1708 and received telephone approval for admission today.  Admission Coordinator:  Clois Dupes, [id number] Date  09/26/2015     

## 2015-09-26 NOTE — Interval H&P Note (Signed)
Tanner Mason was admitted today to Inpatient Rehabilitation with the diagnosis of Guillain Barre Syndrome.  The patient's history has been reviewed, patient examined, and there is no change in status.  Patient continues to be appropriate for intensive inpatient rehabilitation.  I have reviewed the patient's chart and labs.  Questions were answered to the patient's satisfaction. The PAPE has been reviewed and assessment remains appropriate.  Daysen Gundrum T 09/26/2015, 9:00 PM

## 2015-09-26 NOTE — Progress Notes (Signed)
Occupational Therapy Treatment Patient Details Name: Tanner Mason MRN: [id number] DOB: June 18, 1940 Today's Date: 09/26/2015    History of present illness Tanner Mason is an 75 y.o. male patient had abrupt onset of numbness and tingling in his b/l hands on Saturday in addition to numbness in his feet bilaterally. He has known diabetic neuropathy but this was different. Marland Kitchen He was evaluated at an outside hospital who imaged his brain and cervical spine. Reports found  no acute processes and discharged him home. HE has also had a MRI of brain and cervical spine  and he was told they were normal. Due to worsening balance issues and weakness he came to ED at [facility]. Three days after the initial start date he now cannot stand on his own due to significant weakness of his legs. He also has worsening numbness in his legs. HE has no reflexes in his Knees and states he usually does. He is being treated for Guillain-Barre .   OT comments  Pt eager to get OOB. Able to don R sock and front opening gown today in sitting at EOB with min guard assist for safety/balance. Pt reports performing UB exercises on his own between therapy sessions. Co-tx with PT for increasing ambulation with B platform walker. Pt heavily reliant on UEs in standing. Continue to recommend intensive rehab prior to return home with his supportive wife.  Follow Up Recommendations  CIR;Supervision/Assistance - 24 hour    Equipment Recommendations  3 in 1 bedside comode    Recommendations for Other Services      Precautions / Restrictions Precautions Precautions: Fall Precaution Comments: knees will buckle without Upper body support. Restrictions Weight Bearing Restrictions: No       Mobility Bed Mobility Overal bed mobility: Needs Assistance Bed Mobility: Supine to Sit     Supine to sit: Min guard;HOB elevated     General bed mobility comments: min guard for safety  Transfers Overall transfer level: Needs  assistance Equipment used: Bilateral platform walker Transfers: Sit to/from Stand Sit to Stand: +2 physical assistance;Mod assist;Max assist         General transfer comment: +2 mod from elevated bed, +2 max from recliner, using arms to pull up     Balance     Sitting balance-Leahy Scale: Fair       Standing balance-Leahy Scale: Poor                     ADL Overall ADL's : Needs assistance/impaired                 Upper Body Dressing : Supervision/safety;Sitting Upper Body Dressing Details (indicate cue type and reason): front opening gown Lower Body Dressing: Minimal assistance;Maximal assistance;Sitting/lateral leans Lower Body Dressing Details (indicate cue type and reason): donned R sock with min assist, L with max assist crossing foot over opposite knee in sitting             Functional mobility during ADLs: +2 for physical assistance;Maximal assistance;+2 for safety/equipment (rolling B platform walker (EVA)) General ADL Comments: Pt heavily reliant on UEs in standing, unable to perform ADL in standing.      Vision                     Perception     Praxis      Cognition   Behavior During Therapy: Impulsive Overall Cognitive Status: Within Functional Limits for tasks assessed  Extremity/Trunk Assessment               Exercises     Shoulder Instructions       General Comments      Pertinent Vitals/ Pain       Pain Assessment: No/denies pain  Home Living                                          Prior Functioning/Environment              Frequency Min 3X/week     Progress Toward Goals  OT Goals(current goals can now be found in the care plan section)  Progress towards OT goals: Progressing toward goals  Acute Rehab OT Goals Patient Stated Goal: be able to walk again Time For Goal Achievement: 10/03/15 Potential to Achieve Goals: Good  Plan Discharge plan  remains appropriate    Co-evaluation    PT/OT/SLP Co-Evaluation/Treatment: Yes Reason for Co-Treatment: For patient/therapist safety   OT goals addressed during session: ADL's and self-care;Strengthening/ROM      End of Session Equipment Utilized During Treatment: Rolling walker   Activity Tolerance Patient tolerated treatment well   Patient Left in chair;with call bell/phone within reach;with family/visitor present   Nurse Communication          Time: 0925-1000 OT Time Calculation (min): 35 min  Charges: OT General Charges $OT Visit: 1 Procedure OT Treatments $Self Care/Home Management : 8-22 mins  Evern BioMayberry, Gailya Tauer Lynn 09/26/2015, 10:12 AM  [phone]

## 2015-09-26 NOTE — Discharge Summary (Signed)
[facility] Discharge Summary  Patient name: Tanner Mason Medical record number: [id number] Date of birth: 18-May-1940 Age: 75 y.o. Gender: male Date of Admission: 09/17/2015  Date of Discharge: 09/26/15 Admitting Physician: Lupita Dawn, MD  Primary Care Provider: Lupita Dawn, MD Consultants: Neurology  Indication for Hospitalization: Progressive lower extremity weakness, new gait abnormality  Discharge Diagnoses/Problem List:  Guillain Barre Type 2 DM with peripheral neuropathy CKD III Chronic low back pain HTN GERD BPH  Disposition: CIR  Discharge Condition: Stable, improved   Discharge Exam:  General: Sitting up in bed eating breakfast, in NAD HEENT: [place]/AT, EOMI, MMM Neck: IJ in place, no surrounding erythema Cardiovascular: RRR, no murmurs Respiratory: CTAB, normal WOB  Abdomen: +BS, soft, NT/ND Neuro: 5/5 muscle strength in the upper and lower extremities bilaterally, light touch sensation in tact LE Psych: Appropriate affect, normal behavior, mood stable, speech normal  Brief Hospital Course:  Tanner Mason is a 75 year old male with a PMH of Type 2 DM, CKD III, HTN, BPH, and chronic low back pain who presented to the ED with acute, progressive bilateral lower extremity weakness and new gait abnormality. He had received recent neuroimaging including CT head and c-spine which were normal, MRI brain and c-spine which were normal, and MRI lumbar spine that showed possible extruded disc at L5 and mild-moderate left foraminal stenosis at the left L4 nerve. Neurosurgery was consulted by the ED physician and did not think they needed to come see him in the hospital. Neurology was consulted and recommended lumbar puncture. LP was performed on 5/22 and showed elevated protein, consistent with Guillain-Barre. Pt was treated with plasmapheresis daily x 3 days, then every other day for a total of 5 treatments. He tolerated the treatments  without any issues and his symptoms improved. He was evaluated by PT/OT who recommended CIR.  On the night of 5/28, Pt developed throat swelling. He has a history of idiopathic angioedema for which he uses an Epipen at home. He was treated with Epinephrine x 1, Benadryl, and Solumedrol. He subsequently improved and did not have any further issues.   Pt complained of worsening back pain. He has a history of low back pain, but he thought that laying in the hospital bed was making the pain worse. He had a Dexa scan performed 5 years ago that showed he was high risk for fracture. Lumbar x-ray was performed and just showed degenerative disc disease. Pt was treated with Oxycodone 56m q6hrs prn and Tramadol [id number] prn. These medications were not prescribed on discharge to CIR.   Pt's diabetes medications were adjusted periodically throughout his hospitalization. He was discharged to CIR on his home regimen of Lantus 50 units daily and Humalog 02/09/13.  Issues for Follow Up:  1. Please follow-up with patient's weakness/numbness to ensure these continue to improve. 2. Pt had back pain during his hospitalization that he thought was worsened by sleeping in the hospital bed. He was treated with Tramadol and Oxycodone while he was here, but was not discharged with any narcotics prescriptions. Please follow-up with his back pain. 3. Pt stated during hospitalization that he wanted to get a Handicap sticker. He was told to discuss  Significant Procedures: Lumbar puncture 5/22.  Significant Labs and Imaging:   Recent Labs Lab 09/24/15 0417 09/25/15 0349 09/26/15 0746  WBC 9.5 4.6 4.8  HGB 12.1* 11.8* 10.5*  HCT 35.3* 35.2* 30.8*  PLT 125* 96* 90*    Recent Labs Lab  09/20/15 1557  09/21/15 1121 09/22/15 0538 09/23/15 0930 09/24/15 0417 09/25/15 0349 09/26/15 0746  NA 134*  < >  --  137 134* 136 136 137  K 3.1*  < >  --  3.8 3.5 3.8 3.8 3.8  CL 103  < >  --  110 103 108 104 113*  CO2 21*  <  >  --  20* 24 21* 23 16*  GLUCOSE 354*  < >  --  250* 254* 225* 204* 220*  BUN 29*  < >  --  16 21* 24* 22* 34*  CREATININE 1.89*  < >  --  1.36* 1.46* 1.61* 1.64* 1.72*  CALCIUM 8.9  < >  --  8.6* 8.8* 8.8* 9.2 8.6*  MG  --   --  1.5* 1.9 1.9  --   --   --   ALKPHOS 44  --   --   --   --   --   --   --   AST 25  --   --   --   --   --   --   --   ALT 21  --   --   --   --   --   --   --   ALBUMIN 4.3  --   --   --   --   --   --   --   < > = values in this interval not displayed.  Mag 1.7 > 1.9 TSH 1.323 Vitamin B12 471  PT 15.0, INR 1.16 UA: no bacteria, trace Hgb, neg ketones, neg leukocytes, no WBCs, no RBCs CRP 0.6 and ESR 25  Spinal fluid cell count: glucose 148, protein 135, RBC 1, WBC 1  MRI lumbar spine (5/22): Stable appearance. No cord compression. Effacement of the fat around the descending left L5 nerve root that is suspicious for a small sequestered disc fragment. Mild to moderate bilateral foraminal stenosis at L4-L5 and L5-S1 that is stable.  Lumbar x-ray (5/27): No acute injury  Results/Tests Pending at Time of Discharge: None  Discharge Medications:    Medication List    STOP taking these medications        doxycycline 50 MG capsule  Commonly known as:  VIBRAMYCIN      TAKE these medications        amLODipine 10 MG tablet  Commonly known as:  NORVASC  Take 10 mg by mouth daily.     atorvastatin 40 MG tablet  Commonly known as:  LIPITOR  TAKE ONE TABLET BY MOUTH ONE TIME DAILY     [employer] ASPIRIN 81 MG chewable tablet  Generic drug:  aspirin  Chew 81 mg by mouth daily.     BD PEN NEEDLE NANO U/F 32G X 4 MM Misc  Generic drug:  Insulin Pen Needle  USE AS INSTRUCTED BY YOUR PHYSICIAN TO CHECK BLOOD SUGAR     cetirizine 10 MG tablet  Commonly known as:  ZYRTEC  Take 10 mg by mouth every evening.     diphenhydrAMINE 12.5 MG/5ML elixir  Commonly known as:  BENADRYL  Take 25 mg by mouth 4 (four) times daily as needed for allergies.      EPIPEN 2-PAK 0.3 mg/0.3 mL Soaj injection  Generic drug:  EPINEPHrine  INJECT 0.3MLS INTO THE MUSLE ONCE     finasteride 5 MG tablet  Commonly known as:  PROSCAR  Take 1 tablet (5 mg total) by mouth daily.  gabapentin 600 MG tablet  Commonly known as:  NEURONTIN  Take 1 tablet (600 mg total) by mouth 3 (three) times daily.     hydrochlorothiazide 25 MG tablet  Commonly known as:  HYDRODIURIL  TAKE ONE-HALF TABLET BY MOUTH DAILY     insulin lispro 100 UNIT/ML KiwkPen  Commonly known as:  HUMALOG  Inject 10 units with breakfast, 14 units with lunch, and 14 units with dinner.     LANTUS SOLOSTAR 100 UNIT/ML Solostar Pen  Generic drug:  Insulin Glargine  INJECT 50 UNITS INTO THE SKIN DAILY     metoprolol 100 MG tablet  Commonly known as:  LOPRESSOR  TAKE ONE TABLET BY MOUTH TWICE DAILY     omeprazole 20 MG capsule  Commonly known as:  PRILOSEC  TAKE ONE CAPSULE BY MOUTH ONE TIME DAILY     ONE TOUCH ULTRA TEST test strip  Generic drug:  glucose blood  USE TO CHECK BLOOD SUGAR THREE TIMES DAILY     polyethylene glycol packet  Commonly known as:  MIRALAX / GLYCOLAX  Take 17 g by mouth 2 (two) times daily.     potassium chloride 10 MEQ tablet  Commonly known as:  K-DUR,KLOR-CON  TAKE TWO TABLETS BY MOUTH DAILY     tamsulosin 0.4 MG Caps capsule  Commonly known as:  FLOMAX  TAKE ONE CAPSULE BY MOUTH ONE TIME DAILY     VICTOZA 18 MG/3ML Sopn  Generic drug:  Liraglutide  INJECT 1.8MG INTO THE SKIN DAILY        Discharge Instructions: Please refer to Patient Instructions section of EMR for full details.  Patient was counseled important signs and symptoms that should prompt return to medical care, changes in medications, dietary instructions, activity restrictions, and follow up appointments.   Follow-Up Appointments: None  Sela Hua, MD 09/26/2015, 1:11 PM PGY-1, [place]

## 2015-09-26 NOTE — H&P (Signed)
Physical Medicine and Rehabilitation Admission H&P    Chief Complaint  Patient presents with  . BLE weakness with sensory deficits and inability to walk    HPI:  Tanner Mason is a 75 y.o. male with history of DMT2 with neuropathy and gait disorder who was admitted on 09/17/15 with numbness of feet with worsening balance and progressive weakness. Had been seen in the ED few days before with negative work up but continued to worsen and was found to be areflexic with work up consistent with GBS. MRI lumbar spine with mild to moderate stenosis L4/5 and L5/S1 and LP with elevated glucose, elevated protein and elevated IgG. Cultures negative. He was started on PLEX with improvement in strength with ability to stand--last PLEX yesterday.  He had developed angioedema  (history of excessive histamines) that improved with prednisone and benadryl. He has had diarrhea in past 24 hours felt to be due to laxatives.      Review of Systems  HENT: Negative for hearing loss.   Eyes: Negative for blurred vision and double vision.  Respiratory: Negative for cough and shortness of breath.        Sore throat  Cardiovascular: Negative for chest pain and palpitations.  Gastrointestinal: Negative for heartburn, nausea, abdominal pain, diarrhea and constipation.  Genitourinary: Negative for dysuria and urgency.  Musculoskeletal: Positive for myalgias and back pain (due to lying in bed).  Skin: Negative for itching and rash.  Neurological: Positive for tingling and sensory change. Negative for headaches.  Psychiatric/Behavioral: The patient is not nervous/anxious and does not have insomnia.       Past Medical History  Diagnosis Date  . Elbow dislocation [id number]    Right  . Treadmill stress test negative for angina pectoris 06/2002    poor HR and BP recovery  . Abdominal ultrasound, abnormal 02/2004    fatty liver  . Encounter for diagnostic endoscopy 10/22/04    negative  . History of  esophagogastroduodenoscopy 08/13/2007    normal  . Diabetes mellitus without complication [facility])     Past Surgical History  Procedure Laterality Date  . Inguinal hernia repair Right 1947  . Anterior cruciate ligament repair  5/98    Left, staph infection complicated  . Blepharoplasty  03/2005    with ptosis repair  . Basal cell carcinoma excision  02/2005    R ear  . Inguinal hernia repair Left 1970    Family History  Problem Relation Age of Onset  . Alzheimer's disease Mother     died age 69  . Aortic aneurysm Father     died age 108  . Anuerysm Father   . Aortic aneurysm Brother     repaired plus AI graft  . Diabetes Brother      Social History:  Married. Active and works out 5 days/ week. 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 He reports that he quit smoking about 48 years ago. His smoking use included Cigarettes. He started smoking about 73 years ago. He has a 10 pack-year smoking history. He has never used smokeless tobacco. He reports that he drinks about a beer or mixed drink occasionally. He reports that he does not use illicit drugs.    Allergies  Allergen Reactions  . Ace Inhibitors Other (See Comments)    REACTION: Angioedema  . Calcium Carbonate Antacid Other (See Comments)    REACTION: Angioedema of mouth    Medications Prior to Admission  Medication  Sig Dispense Refill  . amLODipine (NORVASC) 10 MG tablet Take 10 mg by mouth daily.    Marland Kitchen aspirin (BAY[employer]IL[employer]ENS ASPIRIN) 81 MG chewable tablet Chew 81 mg by mouth daily.      Marland Kitchen atorvastatin (LIPITOR) 40 MG tablet TAKE ONE TABLET BY MOUTH ONE TIME DAILY 90 tablet 1  . cetirizine (ZYRTEC) 10 MG tablet Take 10 mg by mouth every evening.    . diphenhydrAMINE (BENADRYL) 12.5 MG/5ML elixir Take 25 mg by mouth 4 (four) times daily as needed for allergies.    . finasteride (PROSCAR) 5 MG tablet Take 1 tablet (5 mg total) by mouth daily. 90 tablet 1  . gabapentin (NEURONTIN) 600 MG tablet Take  1 tablet (600 mg total) by mouth 3 (three) times daily. 270 tablet 1  . hydrochlorothiazide (HYDRODIURIL) 25 MG tablet TAKE ONE-HALF TABLET BY MOUTH DAILY 45 tablet 1  . insulin lispro (HUMALOG) 100 UNIT/ML KiwkPen Inject 10 units with breakfast, 14 units with lunch, and 14 units with dinner. 15 mL 11  . LANTUS SOLOSTAR 100 UNIT/ML Solostar Pen INJECT 50 UNITS INTO THE SKIN DAILY 5 pen 3  . omeprazole (PRILOSEC) 20 MG capsule TAKE ONE CAPSULE BY MOUTH ONE TIME DAILY 90 capsule 1  . ONE TOUCH ULTRA TEST test strip USE TO CHECK BLOOD SUGAR THREE TIMES DAILY 100 each 2  . potassium chloride (K-DUR,KLOR-CON) 10 MEQ tablet TAKE TWO TABLETS BY MOUTH DAILY 180 tablet 1  . tamsulosin (FLOMAX) 0.4 MG CAPS capsule TAKE ONE CAPSULE BY MOUTH ONE TIME DAILY 90 capsule 1  . VICTOZA 18 MG/3ML SOPN INJECT 1.8MG INTO THE SKIN DAILY 5 pen 5  . BD PEN NEEDLE NANO U/F 32G X 4 MM MISC USE AS INSTRUCTED BY YOUR PHYSICIAN TO CHECK BLOOD SUGAR (Patient not taking: Reported on 09/17/2015) 100 each 1  . doxycycline (VIBRAMYCIN) 50 MG capsule Take 2 capsules (100 mg total) by mouth 2 (two) times daily. (Patient not taking: Reported on 09/17/2015) 20 capsule 0  . EPIPEN 2-PAK 0.3 MG/0.3ML SOAJ injection INJECT 0.3MLS INTO THE MUSLE ONCE 2 Device 1    Home: Home Living Family/patient expects to be discharged to:: Private residence Living Arrangements: Spouse/significant other Available Help at Discharge: Family Type of Home: House Home Access: Stairs to enter Technical brewer of Steps: 4 Entrance Stairs-Rails: None Home Layout: One level Bathroom Shower/Tub: Multimedia programmer: Handicapped height Home Equipment: Crutches, Other (comment), Grab bars - tub/shower   Functional History: Prior Function Level of Independence: Independent Comments: Very active.  Functional Status:  Mobility: Bed Mobility Overal bed mobility: Needs Assistance Bed Mobility: Supine to Sit Supine to sit: Min guard, HOB  elevated General bed mobility comments: use of bed rail, patient moves quickly ? impulsive or due to lack of motor control. Transfers Overall transfer level: Needs assistance Equipment used: Bilateral platform walker (EVA walker) Transfers: Sit to/from Stand Sit to Stand: Max assist, Mod assist, +2 safety/equipment, +2 physical assistance  Lateral/Scoot Transfers: Min guard General transfer comment: both knees tend to hyperextend during sit to stand. Lifting assist top power up to stand with mod from bed raised and max from reclier with 2 person aassist for safety. the patient's  trunk tends to lean forward once standing..  Knees either in a flexed or hyperextended during stance.Patient reports that he cannot tell where his feet are. Ambulation/Gait Ambulation/Gait assistance: Max assist, Mod assist, +2 safety/equipment, +2 physical assistance ( 3 for following closely with the chair.) Ambulation Distance (Feet): 22 Feet (  then 30') Assistive device: Bilateral platform walker (EVA) Gait Pattern/deviations: Staggering left, Decreased dorsiflexion - right, Decreased dorsiflexion - left, Decreased weight shift to right, Decreased weight shift to left, Scissoring, Ataxic General Gait Details: max assist of 2 with  a third person following with the recliner. The patient's trunk again demonstrates forward lean/pushing with legs behind him.  Poor control of the legs during swing with the feet  stepping on each other at times. The therapist held the RW to prevent it from rolling forward away from the patient. decreased    ADL: ADL Overall ADL's : Needs assistance/impaired Eating/Feeding: Set up, Sitting Eating/Feeding Details (indicate cue type and reason): not coordinated but he can do it.  Some issues with cutting food. Grooming: Wash/dry hands, Wash/dry face, Oral care, Set up, Sitting Grooming Details (indicate cue type and reason): Pt fatigues quickly.  Pt did put shower cap on head and did  rinseless hair wash with min assist to get help to finish b/c arms fatigue overhead. Upper Body Bathing: Minimal assitance, Sitting Upper Body Bathing Details (indicate cue type and reason): assist to be thorough Lower Body Bathing: Moderate assistance, Sit to/from stand Lower Body Bathing Details (indicate cue type and reason): mod assist needed to get to his feet and then he holds to walker while therapist washes bottom.  Pt locks out his knees in standing or they buckle.  Pt is a fall risk in standing. Upper Body Dressing : Supervision/safety, Sitting Upper Body Dressing Details (indicate cue type and reason): front opening gown Lower Body Dressing: Minimal assistance, Maximal assistance, Sitting/lateral leans Lower Body Dressing Details (indicate cue type and reason): donned R sock with min assist, L with max assist crossing foot over opposite knee in sitting Toilet Transfer: Minimal assistance, Requires drop arm, BSC (lateral scoot) Toilet Transfer Details (indicate cue type and reason): Pt laterally scoots very well which gives him some independence with transfers but is unable to stand and transfer to commode without +2 assist due to knees buckling. Toileting- Clothing Manipulation and Hygiene: Minimal assistance, Sitting/lateral lean Tub/Shower Transfer Details (indicate cue type and reason): discussed transfers.  Will see how pt progresses but might be better using tub shower with a bench for safety unless he can become more independent standing. Functional mobility during ADLs: +2 for physical assistance, Maximal assistance, +2 for safety/equipment (rolling B platform walker (EVA)) General ADL Comments: Pt heavily reliant on UEs in standing, unable to perform ADL in standing.  Cognition: Cognition Overall Cognitive Status: Within Functional Limits for tasks assessed Orientation Level: Oriented X4 Cognition Arousal/Alertness: Awake/alert Behavior During Therapy: Impulsive Overall  Cognitive Status: Within Functional Limits for tasks assessed   Blood pressure 111/61, pulse 67, temperature 97.8 F (36.6 C), temperature source Oral, resp. rate 17, height '5\' 7"'  (1.702 m), weight 70.7 kg (155 lb 13.8 oz), SpO2 98 %. Physical Exam  Nursing note and vitals reviewed. Constitutional: He is oriented to person, place, and time. He appears well-developed and well-nourished. No distress.  HENT:  Head: Normocephalic and atraumatic.  Eyes: Conjunctivae and EOM are normal. Pupils are equal, round, and reactive to light. No scleral icterus.  Neck: Normal range of motion. Neck supple.  Cardiovascular: Normal rate and regular rhythm.   Respiratory: Effort normal and breath sounds normal. No respiratory distress. He exhibits no tenderness.  GI: Soft. Bowel sounds are normal. He exhibits no distension. There is no tenderness.  Musculoskeletal: He exhibits no edema or tenderness.  Neurological: He is alert and oriented to  person, place, and time.  Speech clear. Mild ptosis on left--chronic. Cognitively intact.  Motor strength is 4/5 bilateral deltoids, biceps, triceps, grip  3+ to 4/5 hip flexosr 4/5 knee extensor, 2 minus ankle dorsiflexor and 3-4/5 plantar flexors Sensation decreased to LT/Pain below knees bilaterally (1/2)   Skin: Skin is warm and dry. He is not diaphoretic.  Psychiatric: He has a normal mood and affect. His behavior is normal. Judgment and thought content normal.    Results for orders placed or performed during the hospital encounter of 09/17/15 (from the past 48 hour(s))  Glucose, capillary     Status: Abnormal   Collection Time: 09/24/15  4:36 PM  Result Value Ref Range   Glucose-Capillary 384 (H) 65 - 99 mg/dL  Glucose, capillary     Status: Abnormal   Collection Time: 09/24/15  9:34 PM  Result Value Ref Range   Glucose-Capillary 148 (H) 65 - 99 mg/dL   Comment 1 Notify RN    Comment 2 Document in Chart   Basic metabolic panel     Status: Abnormal    Collection Time: 09/25/15  3:49 AM  Result Value Ref Range   Sodium 136 135 - 145 mmol/L   Potassium 3.8 3.5 - 5.1 mmol/L   Chloride 104 101 - 111 mmol/L   CO2 23 22 - 32 mmol/L   Glucose, Bld 204 (H) 65 - 99 mg/dL   BUN 22 (H) 6 - 20 mg/dL   Creatinine, Ser 1.64 (H) 0.61 - 1.24 mg/dL   Calcium 9.2 8.9 - 10.3 mg/dL   GFR calc non Af Amer 40 (L) >60 mL/min   GFR calc Af Amer 46 (L) >60 mL/min    Comment: (NOTE) The eGFR has been calculated using the CKD EPI equation. This calculation has not been validated in all clinical situations. eGFR's persistently <60 mL/min signify possible Chronic Kidney Disease.    Anion gap 9 5 - 15  CBC     Status: Abnormal   Collection Time: 09/25/15  3:49 AM  Result Value Ref Range   WBC 4.6 4.0 - 10.5 K/uL   RBC 3.93 (L) 4.22 - 5.81 MIL/uL   Hemoglobin 11.8 (L) 13.0 - 17.0 g/dL   HCT 35.2 (L) 39.0 - 52.0 %   MCV 89.6 78.0 - 100.0 fL   MCH 30.0 26.0 - 34.0 pg   MCHC 33.5 30.0 - 36.0 g/dL   RDW 15.1 11.5 - 15.5 %   Platelets 96 (L) 150 - 400 K/uL    Comment: SPECIMEN CHECKED FOR CLOTS REPEATED TO VERIFY PLATELET COUNT CONFIRMED BY SMEAR   Glucose, capillary     Status: Abnormal   Collection Time: 09/25/15  7:54 AM  Result Value Ref Range   Glucose-Capillary 213 (H) 65 - 99 mg/dL  Protime-INR     Status: Abnormal   Collection Time: 09/25/15  9:42 AM  Result Value Ref Range   Prothrombin Time 16.1 (H) 11.6 - 15.2 seconds   INR 1.27 0.00 - 1.49  Glucose, capillary     Status: Abnormal   Collection Time: 09/25/15 11:54 AM  Result Value Ref Range   Glucose-Capillary 414 (H) 65 - 99 mg/dL   Comment 1 Notify RN   Glucose, capillary     Status: Abnormal   Collection Time: 09/25/15  4:54 PM  Result Value Ref Range   Glucose-Capillary 255 (H) 65 - 99 mg/dL  Glucose, capillary     Status: Abnormal   Collection Time: 09/25/15 10:05 PM  Result Value Ref Range   Glucose-Capillary 250 (H) 65 - 99 mg/dL  Basic metabolic panel     Status: Abnormal     Collection Time: 09/26/15  7:46 AM  Result Value Ref Range   Sodium 137 135 - 145 mmol/L   Potassium 3.8 3.5 - 5.1 mmol/L   Chloride 113 (H) 101 - 111 mmol/L   CO2 16 (L) 22 - 32 mmol/L   Glucose, Bld 220 (H) 65 - 99 mg/dL   BUN 34 (H) 6 - 20 mg/dL   Creatinine, Ser 1.72 (H) 0.61 - 1.24 mg/dL   Calcium 8.6 (L) 8.9 - 10.3 mg/dL   GFR calc non Af Amer 38 (L) >60 mL/min   GFR calc Af Amer 44 (L) >60 mL/min    Comment: (NOTE) The eGFR has been calculated using the CKD EPI equation. This calculation has not been validated in all clinical situations. eGFR's persistently <60 mL/min signify possible Chronic Kidney Disease.    Anion gap 8 5 - 15  CBC     Status: Abnormal   Collection Time: 09/26/15  7:46 AM  Result Value Ref Range   WBC 4.8 4.0 - 10.5 K/uL   RBC 3.41 (L) 4.22 - 5.81 MIL/uL   Hemoglobin 10.5 (L) 13.0 - 17.0 g/dL   HCT 30.8 (L) 39.0 - 52.0 %   MCV 90.3 78.0 - 100.0 fL   MCH 30.8 26.0 - 34.0 pg   MCHC 34.1 30.0 - 36.0 g/dL   RDW 15.0 11.5 - 15.5 %   Platelets 90 (L) 150 - 400 K/uL    Comment: CONSISTENT WITH PREVIOUS RESULT  Protime-INR     Status: Abnormal   Collection Time: 09/26/15  7:46 AM  Result Value Ref Range   Prothrombin Time 17.1 (H) 11.6 - 15.2 seconds   INR 1.38 0.00 - 1.49  Glucose, capillary     Status: Abnormal   Collection Time: 09/26/15  8:23 AM  Result Value Ref Range   Glucose-Capillary 198 (H) 65 - 99 mg/dL  Glucose, capillary     Status: Abnormal   Collection Time: 09/26/15 12:17 PM  Result Value Ref Range   Glucose-Capillary 199 (H) 65 - 99 mg/dL   No results found.     Medical Problem List and Plan: 1.  Functional and mobility deficits secondary to Guillain Barre Syndrome  -admit to inpatient rehab  -Neurology following along. Consider IVIG if patient doesn't continue to progress 2.  DVT Prophylaxis/Anticoagulation: Pharmaceutical: Lovenox 3. Pain Management: on Neurontin effective as symptoms improving.  4. Mood: LCSW to follow  for evaluation and support.  5. Neuropsych: This patient is capable of making decisions on his own behalf. 6. Skin/Wound Care: Routine pressure relief measures.  7. Fluids/Electrolytes/Nutrition: Encourage po fluid intake.   8. T2DM with peripheral neuropathy:  Uncontrolled --Hgb A1c-7.4. On lantus 50 units with meal coverage tid. Will adjust depending on trend 9. Histamine excess: Monitor for recurrence of angioedema.  Continue to use benadryl/prednisone prn 10. Normocytic anemia: Add iron supplement 11. HTN: Monitor BP bid and adjust medications as indicated. Continue metoprolol bid and norvasc.  12. Acute on chronic renal failure: Encourage po fluids. 13. Diarrhea: Due to laxatives --one episode only today.     Post Admission Physician Evaluation: 1. Functional deficits secondary  to GBS. 2. Patient is admitted to receive collaborative, interdisciplinary care between the physiatrist, rehab nursing staff, and therapy team. 3. Patient's level of medical complexity and substantial therapy needs in context of that medical  necessity cannot be provided at a lesser intensity of care such as a SNF. 4. Patient has experienced substantial functional loss from his/her baseline which was documented above under the "Functional History" and "Functional Status" headings.  Judging by the patient's diagnosis, physical exam, and functional history, the patient has potential for functional progress which will result in measurable gains while on inpatient rehab.  These gains will be of substantial and practical use upon discharge  in facilitating mobility and self-care at the household level. 5. Physiatrist will provide 24 hour management of medical needs as well as oversight of the therapy plan/treatment and provide guidance as appropriate regarding the interaction of the two. 6. 24 hour rehab nursing will assist with bladder management, bowel management, safety, skin/wound care, disease management, medication  administration, pain management and patient education  and help integrate therapy concepts, techniques,education, etc. 7. PT will assess and treat for/with: Lower extremity strength, range of motion, stamina, balance, functional mobility, safety, adaptive techniques and equipment, NMR, proprioception, family ed.   Goals are: mod I. 8. OT will assess and treat for/with: ADL's, functional mobility, safety, upper extremity strength, adaptive techniques and equipment, NMR, proprioception, family education.   Goals are: mod I. Therapy may proceed with showering this patient. 9. SLP will assess and treat for/with: n/a.  Goals are: n/a. 10. Case Management and Social Worker will assess and treat for psychological issues and discharge planning. 11. Team conference will be held weekly to assess progress toward goals and to determine barriers to discharge. 12. Patient will receive at least 3 hours of therapy per day at least 5 days per week. 13. ELOS: 10-17 days       14. Prognosis:  excellent     Meredith Staggers, MD, [facility] 09/26/2015   09/26/2015

## 2015-09-26 NOTE — Progress Notes (Signed)
I have insurance approval and will admit pt to inpt rehab today. I have contacting attending service, RN CM and SW. I will make the arrangements to admit today. [phone]

## 2015-09-26 NOTE — Progress Notes (Signed)
Pt had a lot of diarrhea overnight. MD notified. She advised to not worry about the diarrhea because pt had gotten laxatives days before and he was not actively getting antibiotics. She advised that all laxatives be held today and watch the diarrhea. If uncontrolled, then C diff can be tested. Day RN and Night CN informed about the situation.

## 2015-09-27 ENCOUNTER — Inpatient Hospital Stay (HOSPITAL_COMMUNITY): Payer: Medicare Other

## 2015-09-27 ENCOUNTER — Inpatient Hospital Stay (HOSPITAL_COMMUNITY): Payer: Medicare Other | Admitting: Physical Therapy

## 2015-09-27 ENCOUNTER — Inpatient Hospital Stay (HOSPITAL_COMMUNITY): Payer: Medicare Other | Admitting: Occupational Therapy

## 2015-09-27 DIAGNOSIS — D696 Thrombocytopenia, unspecified: Secondary | ICD-10-CM

## 2015-09-27 DIAGNOSIS — N289 Disorder of kidney and ureter, unspecified: Secondary | ICD-10-CM

## 2015-09-27 DIAGNOSIS — E1142 Type 2 diabetes mellitus with diabetic polyneuropathy: Secondary | ICD-10-CM

## 2015-09-27 DIAGNOSIS — N189 Chronic kidney disease, unspecified: Secondary | ICD-10-CM | POA: Insufficient documentation

## 2015-09-27 DIAGNOSIS — D649 Anemia, unspecified: Secondary | ICD-10-CM | POA: Insufficient documentation

## 2015-09-27 DIAGNOSIS — K5900 Constipation, unspecified: Secondary | ICD-10-CM | POA: Insufficient documentation

## 2015-09-27 LAB — GLUCOSE, CAPILLARY
Glucose-Capillary: 183 mg/dL — ABNORMAL HIGH (ref 65–99)
Glucose-Capillary: 107 mg/dL — ABNORMAL HIGH (ref 65–99)
Glucose-Capillary: 65 mg/dL (ref 65–99)
Glucose-Capillary: 105 mg/dL — ABNORMAL HIGH (ref 65–99)

## 2015-09-27 LAB — COMPREHENSIVE METABOLIC PANEL
ALT: 29 U/L (ref 17–63)
AST: 27 U/L (ref 15–41)
Albumin: 4 g/dL (ref 3.5–5.0)
Alkaline Phosphatase: 28 U/L — ABNORMAL LOW (ref 38–126)
Anion gap: 9 (ref 5–15)
BUN: 28 mg/dL — ABNORMAL HIGH (ref 6–20)
CO2: 19 mmol/L — ABNORMAL LOW (ref 22–32)
Calcium: 9 mg/dL (ref 8.9–10.3)
Chloride: 110 mmol/L (ref 101–111)
Creatinine, Ser: 1.74 mg/dL — ABNORMAL HIGH (ref 0.61–1.24)
GFR calc Af Amer: 43 mL/min — ABNORMAL LOW (ref >60)
GFR calc non Af Amer: 37 mL/min — ABNORMAL LOW (ref >60)
Glucose, Bld: 181 mg/dL — ABNORMAL HIGH (ref 65–99)
Potassium: 3.7 mmol/L (ref 3.5–5.1)
Sodium: 138 mmol/L (ref 135–145)
Total Bilirubin: 0.9 mg/dL (ref 0.3–1.2)
Total Protein: 5.3 g/dL — ABNORMAL LOW (ref 6.5–8.1)

## 2015-09-27 LAB — CBC WITH DIFFERENTIAL/PLATELET
Basophils Absolute: 0 10*3/uL (ref 0.0–0.1)
Basophils Relative: 1 %
Eosinophils Absolute: 0.1 10*3/uL (ref 0.0–0.7)
Eosinophils Relative: 3 %
HCT: 31.9 % — ABNORMAL LOW (ref 39.0–52.0)
Hemoglobin: 10.7 g/dL — ABNORMAL LOW (ref 13.0–17.0)
Lymphocytes Relative: 33 %
Lymphs Abs: 1.4 10*3/uL (ref 0.7–4.0)
MCH: 29.7 pg (ref 26.0–34.0)
MCHC: 33.5 g/dL (ref 30.0–36.0)
MCV: 88.6 fL (ref 78.0–100.0)
Monocytes Absolute: 0.3 10*3/uL (ref 0.1–1.0)
Monocytes Relative: 8 %
Neutro Abs: 2.3 10*3/uL (ref 1.7–7.7)
Neutrophils Relative %: 55 %
Platelets: 112 10*3/uL — ABNORMAL LOW (ref 150–400)
RBC: 3.6 MIL/uL — ABNORMAL LOW (ref 4.22–5.81)
RDW: 14.4 % (ref 11.5–15.5)
WBC: 4.1 10*3/uL (ref 4.0–10.5)

## 2015-09-27 MED ORDER — NON FORMULARY: 10.0000 mg | Freq: Every day | Status: DC

## 2015-09-27 MED ORDER — POLYETHYLENE GLYCOL 3350 17 G PO PACK
17.0000 g | PACK | Freq: Every day | ORAL | Status: DC
  Administered 2015-09-28 – 2015-09-30 (×3): 17 g via ORAL
  Filled 2015-09-27 (×3): qty 1

## 2015-09-27 MED ORDER — SIMETHICONE 80 MG PO CHEW
80.0000 mg | CHEWABLE_TABLET | Freq: Three times a day (TID) | ORAL | Status: DC
  Administered 2015-09-27 – 2015-10-09 (×35): 80 mg via ORAL
  Filled 2015-09-27 (×34): qty 1

## 2015-09-27 MED ORDER — NON FORMULARY
1.8000 mg | Freq: Every day | Status: DC
  Administered 2015-09-27: 1.8 mg via SUBCUTANEOUS

## 2015-09-27 MED ORDER — FLEET ENEMA 7-19 GM/118ML RE ENEM
1.0000 | ENEMA | Freq: Once | RECTAL | Status: AC
  Administered 2015-09-27: 1 via RECTAL
  Filled 2015-09-27: qty 1

## 2015-09-27 MED ORDER — CETIRIZINE HCL 10 MG PO TABS
10.0000 mg | ORAL_TABLET | Freq: Every day | ORAL | Status: DC
  Administered 2015-09-27 – 2015-10-09 (×13): 10 mg via ORAL
  Filled 2015-09-27 (×13): qty 1

## 2015-09-27 NOTE — Plan of Care (Signed)
Problem: RH Balance Goal: LTG Patient will maintain dynamic standing balance (PT) LTG: Patient will maintain dynamic standing balance with assistance during mobility activities (PT) With LRAD  Problem: RH Bed Mobility Goal: LTG Patient will perform bed mobility with assist (PT) LTG: Patient will perform bed mobility with assistance, with/without cues (PT). Regular bed  Problem: RH Furniture Transfers Goal: LTG Patient will perform furniture transfers w/assist (OT/PT LTG: Patient will perform furniture transfers with assistance (OT/PT). With LRAD  Problem: RH Ambulation Goal: LTG Patient will ambulate in controlled environment (PT) LTG: Patient will ambulate in a controlled environment, # of feet with assistance (PT). 50 ft with LRAD Goal: LTG Patient will ambulate in home environment (PT) LTG: Patient will ambulate in home environment, # of feet with assistance (PT). 50 ft with LRAD  Problem: RH Wheelchair Mobility Goal: LTG Patient will propel w/c in controlled environment (PT) LTG: Patient will propel wheelchair in controlled environment, # of feet with assist (PT) 150 ft with LRAD Goal: LTG Patient will propel w/c in community environment (PT) LTG: Patient will propel wheelchair in community environment, # of feet with assist (PT) 150 ft with LRAD  Problem: RH Stairs Goal: LTG Patient will ambulate up and down stairs w/assist (PT) LTG: Patient will ambulate up and down # of stairs with assistance (PT) 2 steps + 2 steps with LRAD

## 2015-09-27 NOTE — Progress Notes (Addendum)
Tanner Mason PHYSICAL MEDICINE & REHABILITATION     PROGRESS NOTE  Subjective/Complaints:  Patient seen lying in bed this morning. He states he did not sleep well because his back was itching and he felt that he needed to have a bowel movement but was unable to.  ROS: + Constipation. Denies CP, SOB, nausea, vomiting, diarrhea.  Objective: Vital Signs: Blood pressure 120/68, pulse 72, temperature 98.5 F (36.9 C), temperature source Oral, resp. rate 18, height  (1.702 m), weight 69.99 kg (154 lb 4.8 oz), SpO2 98 %. No results found.  Recent Labs  09/25/15 0349 09/25/15 1311 09/26/15 0746  WBC 4.6  --  4.8  HGB 11.8* 10.9* 10.5*  HCT 35.2* 32.0* 30.8*  PLT 96*  --  90*    Recent Labs  09/25/15 0349 09/25/15 1311 09/26/15 0746  NA 136 136 137  K 3.8 4.5 3.8  CL 104 99* 113*  GLUCOSE 204* 391* 220*  BUN 22* 27* 34*  CREATININE 1.64* 1.70* 1.72*  CALCIUM 9.2  --  8.6*   CBG (last 3)   Recent Labs  09/26/15 1713 09/26/15 2042 09/27/15 0646  GLUCAP 100* 137* 183*    Wt Readings from Last 3 Encounters:  09/26/15 69.99 kg (154 lb 4.8 oz)  09/23/15 70.7 kg (155 lb 13.8 oz)  07/14/15 76.204 kg (168 lb)    Physical Exam:  BP 120/68 mmHg  Pulse 72  Temp(Src) 98.5 F (36.9 C) (Oral)  Resp 18  Ht  (1.702 m)  Wt 69.99 kg (154 lb 4.8 oz)  BMI 24.16 kg/m2  SpO2 98% Constitutional: He appears well-developed and well-nourished. No distress.  HENT: Normocephalic and atraumatic.  Eyes: Conjunctivae and EOM are normal. No scleral icterus.  Cardiovascular: Normal rate and regular rhythm.  Respiratory: Effort normal and breath sounds normal. No respiratory distress. He exhibits no tenderness.  GI: Soft. Bowel sounds are normal. He exhibits no distension. There is no tenderness.  Musculoskeletal: He exhibits no edema or tenderness.  Neurological: He is alert and oriented.  Speech clear.  Mild ptosis on left--chronic.  Motor: 4+/5 bilateral deltoids, biceps,  triceps, grip 3+/5 hip flexosr 3+/5 knee extensor, 3+/5 ankle dorsiflexor  Sensation decreased to LT below knees bilaterally DTRs 0/2 bilateral knees Skin: Skin is warm and dry. He is not diaphoretic.  Psychiatric: He has a normal mood and affect. His behavior is normal. Judgment and thought content normal   Assessment/Plan: 1. Functional deficits secondary to Guillain Barre Syndrome which require 3+ hours per day of interdisciplinary therapy in a comprehensive inpatient rehab setting. Physiatrist is providing close team supervision and 24 hour management of active medical problems listed below. Physiatrist and rehab team continue to assess barriers to discharge/monitor patient progress toward functional and medical goals.  Function:  Bathing Bathing position      Bathing parts      Bathing assist        Upper Body Dressing/Undressing Upper body dressing                    Upper body assist        Lower Body Dressing/Undressing Lower body dressing                                  Lower body assist        Toileting Toileting          Toileting assist  Transfers Chair/bed Company secretarytransfer             Locomotion Ambulation           Wheelchair          Cognition Comprehension Comprehension assist level: Follows complex conversation/direction with no assist  Expression Expression assist level: Expresses complex ideas: With no assist  Social Interaction    Problem Solving    Memory      Medical Problem List and Plan: 1. Functional and mobility deficits secondary to Guillain Barre Syndrome -Neurology following along. Consider IVIG if patient doesn't continue to progress  Begin CIR 2. DVT Prophylaxis/Anticoagulation: Pharmaceutical: Lovenox 3. Pain Management: on Neurontin effective as symptoms improving.  4. Mood: LCSW to follow for evaluation and support.  5. Neuropsych: This patient is capable of making decisions on  his own behalf. 6. Skin/Wound Care: Routine pressure relief measures.  7. Fluids/Electrolytes/Nutrition: Encourage po fluid intake.  8. T2DM with peripheral neuropathy: Uncontrolled --Hgb A1c-7.4.   On lantus 50 units with meal coverage tid. Will adjust depending on trend 9. Histamine excess:   Monitor for recurrence of angioedema.   Continue to use benadryl/prednisone prn 10. Normocytic anemia:   Added iron supplement  Labs pending for today 11. HTN: Monitor BP and adjust medications as indicated.   Continue metoprolol bid and norvasc.  12. Acute on chronic renal failure:   Encourage po fluids.  Labs pending for today 13. Diarrhea/Constipation:   Now with constipation per pt  Will order KUB and consider further interventions thereafter. 14. Thrombocytopenia  Labs pending for today  LOS (Days) 1 A FACE TO FACE EVALUATION WAS PERFORMED  Ankit Karis Jubanil Patel 09/27/2015 7:25 AM

## 2015-09-27 NOTE — Progress Notes (Signed)
Social Work Assessment and Plan  Patient Details  Name: Tanner Mason MRN: [id number] Date of Birth: 1940-05-23  Today's Date: 09/27/2015  Problem List:  Patient Active Problem List   Diagnosis Date Noted  . Constipation   . Thrombocytopenia ([place])   . Acute on chronic renal insufficiency (HCC)   . Normocytic anemia   . Type 2 diabetes mellitus with peripheral neuropathy (HCC)   . Bilateral leg weakness   . Idiopathic angioedema   . Leg weakness   . Back pain   . Hypomagnesemia   . Type 2 diabetes mellitus with diabetic neuropathy, unspecified ([place])   . Guillain Barr syndrome ([place])   . Weakness 09/17/2015  . Gait abnormality 09/17/2015  . Gait instability 09/17/2015  . Cellulitis and abscess 07/14/2015  . Preventative health care 11/08/2014  . Angio-edema 07/14/2014  . Angioedema 07/13/2014  . Left foot drop 04/05/2014  . Lumbar back pain with radiculopathy affecting left lower extremity 04/05/2014  . Atypical chest pain 12/14/2013  . Hypokalemia 03/16/2013  . Overweight (BMI 25.0-29.9) 09/15/2012  . Stage 3 chronic renal impairment associated with type 2 diabetes mellitus ([place]) 07/20/2010  . ULNAR NERVE ENTRAPMENT, RIGHT 06/07/2008  . Diabetes mellitus ([place]) 12/09/2007  . Essential hypertension, benign 12/08/2007  . DIASTASIS RECTI 12/08/2007  . HYPERLIPIDEMIA 06/26/2006  . ANEMIA, OTHER, UNSPECIFIED 06/26/2006  . Peripheral autonomic neuropathy due to diabetes mellitus ([place]) 06/26/2006  . GASTROESOPHAGEAL REFLUX DISEASE, CHRONIC 06/26/2006  . Enlarged prostate with lower urinary tract symptoms (LUTS) 06/26/2006   Past Medical History:  Past Medical History  Diagnosis Date  . Elbow dislocation 1975,8832    Right  . Treadmill stress test negative for angina pectoris 06/2002    poor HR and BP recovery  . Abdominal ultrasound, abnormal 02/2004    fatty liver  . Encounter for diagnostic endoscopy 10/22/04    negative  . History of esophagogastroduodenoscopy  08/13/2007    normal  . Diabetes mellitus without complication [facility])    Past Surgical History:  Past Surgical History  Procedure Laterality Date  . Inguinal hernia repair Right 1947  . Anterior cruciate ligament repair  5/98    Left, staph infection complicated  . Blepharoplasty  03/2005    with ptosis repair  . Basal cell carcinoma excision  02/2005    R ear  . Inguinal hernia repair Left 1970   Social History:  reports that he quit smoking about 48 years ago. His smoking use included Cigarettes. He started smoking about 73 years ago. He has a 10 pack-year smoking history. He has never used smokeless tobacco. He reports that he drinks about 1.0 oz of alcohol per week. He reports that he does not use illicit drugs.  Family / Support Systems Marital Status: Married How Long?: 21 years Patient Roles: Spouse, Parent, Other (Comment) (parent to 2 dtrs; grandparent to 5 grandchildren) Spouse/Significant Other: Georgi Navarrete - wife - [phone]; [phone] Children: Leona Carry - dtr - [phone] Anticipated Caregiver: wife and dtr at times Ability/Limitations of Caregiver: wife retired also Caregiver Availability: 24/7 Family Dynamics: Pt reports a close, supportive relationship.  Social History Preferred language: English Religion: Lutheran Read: Yes Write: Yes Employment Status: Retired Date Retired/Disabled/Unemployed: 2011 Age Retired: 65 Public relations account executive Issues: none reported Guardian/Conservator: N/A - Pt is capable of making his own decisions.   Abuse/Neglect Physical Abuse: Denies Verbal Abuse: Denies Sexual Abuse: Denies Exploitation of patient/patient's resources: Denies Self-Neglect: Denies  Emotional Status Pt's affect, behavior adn adjustment  status: Pt is positive and motivated to get well.  He was taken aback by this illness and did not know anything about it. Recent Psychosocial Issues: none reported Psychiatric History: none  reported Substance Abuse History: none reported  Patient / Family Perceptions, Expectations & Goals Pt/Family understanding of illness & functional limitations: Pt reports a good understanding of his condition now and that doctors have answered all of his questions and he feels fully educated.  He has also heard from friends/family who have known someone who had GBS and this has made him feel better and normalized things in a way. Premorbid pt/family roles/activities: Pt was active PTA.  He and wife enjoy being outside, going to [facility], spending time with family.  He and wife recently traveled - [place].  Pt does much of the outside housework. Anticipated changes in roles/activities/participation: Pt's grandson has helped with yardwork in the past and he feels he would help again, if needed.  Otherwise, pt hopes to return to these roles/activities eventually. Pt/family expectations/goals: Pt wants to return to his activities as he is able.  Community Resources Express Scripts: None Premorbid Home Care/DME Agencies: None (Pt does have grab bars and handicap height commode.) Transportation available at discharge: wife Resource referrals recommended: Neuropsychology (if needed)  Discharge Planning Living Arrangements: Spouse/significant other Support Systems: Spouse/significant other, Children, Other relatives, Friends/neighbors Type of Residence: Private residence Insurance Resources: Multimedia programmer (specify) (Marine scientist) Financial Resources: Radio broadcast assistant Screen Referred: No Money Management: Patient Does the patient have any problems obtaining your medications?: No Home Management: Pt and wife share household chores.  Wife can manage inside chores while pt is recovering and pt's grandson can assist with yardwork. Patient/Family Preliminary Plans: Pt to go home with his wife to provide 24/7 supervision.  Dtr can assist at times, as  well. Barriers to Discharge: Steps Social Work Anticipated Follow Up Needs: HH/OP Expected length of stay: 14 to 16 days  Clinical Impression CSW met with pt to introduce self and role of CSW, as well as to complete assessment.  Pt was very open and talkative with CSW and is motivated to work hard and to get better.  Pt's wife later came to visit and CSW explained to her how CSW can assist.  Pt is eager to get home, but understands he needs the therapy to be as strong and independent as possible.  CSW explained that even though therapists feel he needs 2 weeks, if he improves quicker than expected, we will change his d/c accordingly.  Pt's wife and dtr will be there with pt when he goes home.  Pt seems to be coping well emotionally, but admits that this has really taken him by surprise.  CSW will continue to check in with how pt is coping and will continue to follow and assist as needed.  Kylei Purington, Silvestre Mesi 09/27/2015, 2:17 PM

## 2015-09-27 NOTE — Progress Notes (Signed)
Patient information reviewed and entered into eRehab system by Alayha Babineaux, RN, CRRN, PPS Coordinator.  Information including medical coding and functional independence measure will be reviewed and updated through discharge.     Per nursing patient was given "Data Collection Information Summary for Patients in Inpatient Rehabilitation Facilities with attached "Privacy Act Statement-Health Care Records" upon admission.  

## 2015-09-27 NOTE — Evaluation (Signed)
Occupational Therapy Assessment and Plan  Patient Details  Name: Tanner Mason MRN: [id number] Date of Birth: [birth year]  OT Diagnosis: muscle weakness (generalized) Rehab Potential: Rehab Potential (ACUTE ONLY): Good ELOS: 14-18 days   Today's Date: 09/27/2015 OT Individual Time: 1000-1100 OT Individual Time Calculation (min): 60 min     Problem List:  Patient Active Problem List   Diagnosis Date Noted  . Constipation   . Thrombocytopenia ([place])   . Acute on chronic renal insufficiency (HCC)   . Normocytic anemia   . Type 2 diabetes mellitus with peripheral neuropathy (HCC)   . Bilateral leg weakness   . Idiopathic angioedema   . Leg weakness   . Back pain   . Hypomagnesemia   . Type 2 diabetes mellitus with diabetic neuropathy, unspecified ([place])   . Guillain Barr syndrome ([place])   . Weakness 09/17/2015  . Gait abnormality 09/17/2015  . Gait instability 09/17/2015  . Cellulitis and abscess 07/14/2015  . Preventative health care 11/08/2014  . Angio-edema 07/14/2014  . Angioedema 07/13/2014  . Left foot drop 04/05/2014  . Lumbar back pain with radiculopathy affecting left lower extremity 04/05/2014  . Atypical chest pain 12/14/2013  . Hypokalemia 03/16/2013  . Overweight (BMI 25.0-29.9) 09/15/2012  . Stage 3 chronic renal impairment associated with type 2 diabetes mellitus ([place]) 07/20/2010  . ULNAR NERVE ENTRAPMENT, RIGHT 06/07/2008  . Diabetes mellitus ([place]) 12/09/2007  . Essential hypertension, benign 12/08/2007  . DIASTASIS RECTI 12/08/2007  . HYPERLIPIDEMIA 06/26/2006  . ANEMIA, OTHER, UNSPECIFIED 06/26/2006  . Peripheral autonomic neuropathy due to diabetes mellitus ([place]) 06/26/2006  . GASTROESOPHAGEAL REFLUX DISEASE, CHRONIC 06/26/2006  . Enlarged prostate with lower urinary tract symptoms (LUTS) 06/26/2006    Past Medical History:  Past Medical History  Diagnosis Date  . Elbow dislocation [id number]    Right  . Treadmill stress test negative for angina  pectoris 06/2002    poor HR and BP recovery  . Abdominal ultrasound, abnormal 02/2004    fatty liver  . Encounter for diagnostic endoscopy 10/22/04    negative  . History of esophagogastroduodenoscopy 08/13/2007    normal  . Diabetes mellitus without complication [facility])    Past Surgical History:  Past Surgical History  Procedure Laterality Date  . Inguinal hernia repair Right 1947  . Anterior cruciate ligament repair  5/98    Left, staph infection complicated  . Blepharoplasty  03/2005    with ptosis repair  . Basal cell carcinoma excision  02/2005    R ear  . Inguinal hernia repair Left 1970    Assessment & Plan Clinical Impression: Patient is a 75 y.o. male with history of DMT2 with neuropathy and gait disorder who was admitted on 09/17/15 with numbness of feet with worsening balance and progressive weakness. Had been seen in the ED few days before with negative work up but continued to worsen and was found to be areflexic with work up consistent with GBS. MRI lumbar spine with mild to moderate stenosis L4/5 and L5/S1 and LP with elevated glucose, elevated protein and elevated IgG. Cultures negative. He was started on PLEX with improvement in strength with ability to stand--last PLEX yesterday. He had developed angioedema (history of excessive histamines) that improved with prednisone and benadryl. He has had diarrhea in past 24 hours felt to be due to laxatives.   Patient transferred to CIR on 09/26/2015 .    Patient currently requires max with basic self-care skills secondary to muscle weakness, impaired timing and sequencing and  unbalanced muscle activation, and decreased standing balance, decreased postural control and decreased balance strategies.  Prior to hospitalization, patient could complete ADLs with independent .  Patient will benefit from skilled intervention to decrease level of assist with basic self-care skills and increase independence with basic self-care skills prior  to discharge home with care partner.  Anticipate patient will require intermittent supervision and follow up home health.  OT - End of Session Activity Tolerance: Tolerates 30+ min activity with multiple rests Endurance Deficit: Yes Endurance Deficit Description: required multiple rest breaks OT Assessment Rehab Potential (ACUTE ONLY): Good OT Patient demonstrates impairments in the following area(s): Balance;Endurance;Motor;Perception;Safety;Sensory OT Basic ADL's Functional Problem(s): Grooming;Bathing;Dressing;Toileting OT Transfers Functional Problem(s): Toilet;Tub/Shower OT Additional Impairment(s): None OT Plan OT Intensity: Minimum of 1-2 x/day, 45 to 90 minutes OT Frequency: 5 out of 7 days OT Duration/Estimated Length of Stay: 14-18 days OT Treatment/Interventions: Medical illustrator training;Community reintegration;Discharge planning;Disease mangement/prevention;DME/adaptive equipment instruction;Functional mobility training;Neuromuscular re-education;Pain management;Patient/family education;Psychosocial support;Self Care/advanced ADL retraining;Skin care/wound managment;Therapeutic Activities;Therapeutic Exercise;UE/LE Strength taining/ROM;UE/LE Coordination activities OT Self Feeding Anticipated Outcome(s): no goal OT Basic Self-Care Anticipated Outcome(s): Supervision OT Toileting Anticipated Outcome(s): Supervision OT Bathroom Transfers Anticipated Outcome(s): Supervision   Skilled Therapeutic Intervention OT eval completed with discussion of rehab process, OT purpose, POC, ELOS, and goals.  ADL assessment completed with bathing at shower level at dressing sit > stand from w/c level at sink.  Mod assist squat pivot transfer to w/c with mod cues for placement of BLE due to impaired sensation and proprioception.  Bathing completed at sit > stand level with pt requiring mod assist for sit > stand and UE support on grab bar while therapist washed buttocks.  Educated on dressing  weaker leg first with LB dressing and crossing leg over opposite knee to increase success with threading shorts.  Max assist for standing balance and blocking at knees when pt released UE support on sink and attempted to pull pants over hips without UE support.  Grooming tasks completed in sitting at sink with setup to obtain items.   OT Evaluation Precautions/Restrictions  Precautions Precautions: Fall Precaution Comments: BLE knee buckling Restrictions Weight Bearing Restrictions: No Pain Pain Assessment Pain Assessment: No/denies pain Home Living/Prior Functioning Home Living Available Help at Discharge: Available 24 hours/day Type of Home: House Home Access: Stairs to enter [employer] of Steps: 2+2 Entrance Stairs-Rails: None Home Layout: One level Bathroom Shower/Tub: Multimedia programmer: Handicapped height Bathroom Accessibility: Yes  Lives With: Spouse Prior Function Level of Independence: Independent with basic ADLs, Independent with homemaking with ambulation, Independent with transfers, Independent with gait  Able to Take Stairs?: Yes Driving: Yes Vocation: Retired Leisure: Hobbies-yes (Comment) Comments: gardening, working at [employer]  See Function Navigator Vision/Perception  Vision- History Baseline Vision/History: Wears glasses Wears Glasses: At all times Patient Visual Report: No change from baseline Vision- Assessment Vision Assessment?: No apparent visual deficits  Cognition Overall Cognitive Status: Within Functional Limits for tasks assessed Arousal/Alertness: Awake/alert Orientation Level: Person;Place;Situation Person: Oriented Place: Oriented Situation: Oriented Memory: Appears intact Attention: Selective Selective Attention: Appears intact Awareness: Appears intact Problem Solving: Appears intact Safety/Judgment: Appears intact Sensation Sensation Light Touch: Impaired Detail Light Touch Impaired Details: Impaired  LLE;Impaired RLE (decreasing sensation to light touch distal to B knees) Stereognosis: Not tested Hot/Cold: Not tested Proprioception: Appears Intact (BLE) Additional Comments: Pt reports mild tingling in hands and knees down to feet Coordination Gross Motor Movements are Fluid and Coordinated: No Fine Motor Movements are Fluid and Coordinated: Yes Finger Nose  Finger Test:  (equal BUE) Heel Shin Test:  (equal time BLE, but impaired coordination) Motor  Motor Motor:  (signficant BLE weakness, B knee buckling) Mobility  Bed Mobility Bed Mobility: Rolling Right;Rolling Left;Supine to Sit;Sit to Supine (regular bed) Rolling Right: 5: Supervision Rolling Left: 5: Supervision Supine to Sit: 5: Supervision Sit to Supine: 5: Supervision Transfers Sit to Stand: 3: Mod assist Sit to Stand Details: Verbal cues for technique;Verbal cues for sequencing (cuing for hand placement on w/c armrests) Stand to Sit: 2: Max assist Stand to Sit Details (indicate cue type and reason): Verbal cues for technique;Verbal cues for precautions/safety;Verbal cues for sequencing  Trunk/Postural Assessment     Balance Balance Balance Assessed: Yes Static Sitting Balance Static Sitting - Balance Support: Bilateral upper extremity supported Static Sitting - Level of Assistance: 5: Stand by assistance Static Standing Balance Static Standing - Balance Support: Bilateral upper extremity supported Static Standing - Level of Assistance: 2: Max assist Extremity/Trunk Assessment RUE Assessment RUE Assessment: Within Functional Limits (loose gross grasp, strength grossly 4/5) LUE Assessment LUE Assessment: Within Functional Limits (strength grossly 4/5, loose gross grasp)   See Function Navigator for Current Functional Status.   Refer to Care Plan for Long Term Goals  Recommendations for other services: None  Discharge Criteria: Patient will be discharged from OT if patient refuses treatment 3 consecutive  times without medical reason, if treatment goals not met, if there is a change in medical status, if patient makes no progress towards goals or if patient is discharged from hospital.  The above assessment, treatment plan, treatment alternatives and goals were discussed and mutually agreed upon: by patient  Simonne Come 09/27/2015, 12:27 PM

## 2015-09-27 NOTE — IPOC Note (Addendum)
Overall Plan of Care [facility]) Patient Details Name: Tanner Mason MRN: [id number] DOB: 1941/02/26  Admitting Diagnosis: [facility]  GBS  Hospital Problems: Principal Problem:   Guillain Barr syndrome [facility]) Active Problems:   Constipation   Thrombocytopenia (HCC)   Acute on chronic renal insufficiency (HCC)   Normocytic anemia   Type 2 diabetes mellitus with peripheral neuropathy (HCC)   Irregular bowel habits     Functional Problem List: Nursing Bowel, Medication Management, Endurance, Motor, Pain, Safety  PT Balance, Endurance, Motor, Pain, Sensory  OT Balance, Endurance, Motor, Perception, Safety, Sensory  SLP    TR         Basic ADL's: OT Grooming, Bathing, Dressing, Toileting     Advanced  ADL's: OT       Transfers: PT Bed Mobility, Bed to Chair, Furniture, Customer service manager, Research scientist (life sciences): PT Psychologist, prison and probation services, Ambulation, Stairs     Additional Impairments: OT None  SLP        TR      Anticipated Outcomes Item Anticipated Outcome  Self Feeding no goal  Swallowing      Basic self-care  Marketing executive Transfers Supervision  Bowel/Bladder  Manage bowel and bladder independently  Transfers  supervision  Locomotion  supervision  Communication     Cognition     Pain  pain less tha 2  Safety/Judgment  Remain safe while on Rehab unit   Therapy Plan: PT Intensity: Minimum of 1-2 x/day ,45 to 90 minutes PT Frequency: 5 out of 7 days PT Duration Estimated Length of Stay: 14-18 days OT Intensity: Minimum of 1-2 x/day, 45 to 90 minutes OT Frequency: 5 out of 7 days OT Duration/Estimated Length of Stay: 14-18 days         Team Interventions: Nursing Interventions Patient/Family Education, Bowel Management, Disease Management/Prevention, Pain Management, Medication Management, Dysphagia/Aspiration Precaution Training, Discharge Planning  PT interventions Ambulation/gait training, Discharge planning,  Functional mobility training, Psychosocial support, Therapeutic Activities, Balance/vestibular training, Disease management/prevention, Neuromuscular re-education, Skin care/wound management, Therapeutic Exercise, Wheelchair propulsion/positioning, UE/LE Strength taining/ROM, Splinting/orthotics, Pain management, DME/adaptive equipment instruction, Community reintegration, Equities trader education, Museum/gallery curator, UE/LE Coordination activities  OT Interventions Warden/ranger, Firefighter, Discharge planning, Disease mangement/prevention, Fish farm manager, Functional mobility training, Neuromuscular re-education, Pain management, Patient/family education, Psychosocial support, Self Care/advanced ADL retraining, Skin care/wound managment, Therapeutic Activities, Therapeutic Exercise, UE/LE Strength taining/ROM, UE/LE Coordination activities  SLP Interventions    TR Interventions    SW/CM Interventions Discharge Planning, Psychosocial Support, Patient/Family Education    Team Discharge Planning: Destination: PT-Home ,OT-  Home , SLP-  Projected Follow-up: PT-24 hour supervision/assistance, Home health PT, Outpatient PT (HHPT vs OPPT), OT-   24 hour supervision/assistance, Home Health PT, Outpatient PT, SLP-  Projected Equipment Needs: PT-To be determined, OT-  Tub/shower bench, SLP-  Equipment Details: PT- , OT-  Patient/family involved in discharge planning: PT- Patient,  OT- Patient, SLP-   MD ELOS: 12-15 days. Medical Rehab Prognosis:  Excellent Assessment: 75 y.o. male with history of DMT2 with neuropathy and gait disorder who was admitted on 09/17/15 with numbness of feet with worsening balance and progressive weakness. Had been seen in the ED few days before with negative work up but continued to worsen and was found to be areflexic with work up consistent with GBS. MRI lumbar spine with mild to moderate stenosis L4/5 and L5/S1 and LP with elevated  glucose, elevated protein and elevated IgG. Cultures negative. He  was started on PLEX with improvement in strength with ability to stand. He had developed angioedema(history of excessive histamines) that improved with prednisone and benadryl.  Resulting functional deficits with mobility and abnormality of gait. Will set goals for supervision with therapies.    See Team Conference Notes for weekly updates to the plan of care

## 2015-09-27 NOTE — Evaluation (Signed)
Physical Therapy Assessment and Plan  Patient Details  Name: Tanner Mason MRN: [id number] Date of Birth: [birth year]  PT Diagnosis: Abnormality of gait, Coordination disorder, Difficulty walking, Impaired sensation and Muscle weakness Rehab Potential: Good ELOS: 14-18 days   Today's Date: 09/27/2015 PT Individual Time: 1100-1201 PT Individual Time Calculation (min): 61 min    Problem List:  Patient Active Problem List   Diagnosis Date Noted  . Constipation   . Thrombocytopenia ([place])   . Acute on chronic renal insufficiency (HCC)   . Normocytic anemia   . Type 2 diabetes mellitus with peripheral neuropathy (HCC)   . Bilateral leg weakness   . Idiopathic angioedema   . Leg weakness   . Back pain   . Hypomagnesemia   . Type 2 diabetes mellitus with diabetic neuropathy, unspecified ([place])   . Guillain Barr syndrome ([place])   . Weakness 09/17/2015  . Gait abnormality 09/17/2015  . Gait instability 09/17/2015  . Cellulitis and abscess 07/14/2015  . Preventative health care 11/08/2014  . Angio-edema 07/14/2014  . Angioedema 07/13/2014  . Left foot drop 04/05/2014  . Lumbar back pain with radiculopathy affecting left lower extremity 04/05/2014  . Atypical chest pain 12/14/2013  . Hypokalemia 03/16/2013  . Overweight (BMI 25.0-29.9) 09/15/2012  . Stage 3 chronic renal impairment associated with type 2 diabetes mellitus ([place]) 07/20/2010  . ULNAR NERVE ENTRAPMENT, RIGHT 06/07/2008  . Diabetes mellitus ([place]) 12/09/2007  . Essential hypertension, benign 12/08/2007  . DIASTASIS RECTI 12/08/2007  . HYPERLIPIDEMIA 06/26/2006  . ANEMIA, OTHER, UNSPECIFIED 06/26/2006  . Peripheral autonomic neuropathy due to diabetes mellitus ([place]) 06/26/2006  . GASTROESOPHAGEAL REFLUX DISEASE, CHRONIC 06/26/2006  . Enlarged prostate with lower urinary tract symptoms (LUTS) 06/26/2006    Past Medical History:  Past Medical History  Diagnosis Date  . Elbow dislocation [id number]    Right  .  Treadmill stress test negative for angina pectoris 06/2002    poor HR and BP recovery  . Abdominal ultrasound, abnormal 02/2004    fatty liver  . Encounter for diagnostic endoscopy 10/22/04    negative  . History of esophagogastroduodenoscopy 08/13/2007    normal  . Diabetes mellitus without complication [facility])    Past Surgical History:  Past Surgical History  Procedure Laterality Date  . Inguinal hernia repair Right 1947  . Anterior cruciate ligament repair  5/98    Left, staph infection complicated  . Blepharoplasty  03/2005    with ptosis repair  . Basal cell carcinoma excision  02/2005    R ear  . Inguinal hernia repair Left 1970    Assessment & Plan Clinical Impression: Patient is a 75 y.o. year old male with history of DMT2 with neuropathy and gait disorder who was admitted on 09/17/15 with numbness of feet with worsening balance and progressive weakness. Had been seen in the ED few days before with negative work up but continued to worsen and was found to be areflexic with work up consistent with GBS. MRI lumbar spine with mild to moderate stenosis L4/5 and L5/S1 and LP with elevated glucose, elevated protein and elevated IgG. Cultures negative. He was started on PLEX with improvement in strength with ability to stand--last PLEX yesterday. He had developed angioedema (history of excessive histamines) that improved with prednisone and benadryl. He has had diarrhea in past 24 hours felt to be due to laxatives.  Patient transferred to CIR on 09/26/2015 .   Patient currently requires max with mobility secondary to muscle weakness, impaired timing and  sequencing and decreased coordination and decreased standing balance and decreased balance strategies.  Prior to hospitalization, patient was independent  with mobility and lived with Spouse in a House home.  Home access is 2+2Stairs to enter.  Patient will benefit from skilled PT intervention to maximize safe functional mobility, minimize  fall risk and decrease caregiver burden for planned discharge home with 24 hour supervision.  Anticipate patient will HHPT vs OPPT at discharge.  PT - End of Session Activity Tolerance: Tolerates 30+ min activity with multiple rests Endurance Deficit: Yes Endurance Deficit Description: 2/2 fatigue PT Assessment Rehab Potential (ACUTE/IP ONLY): Good Barriers to Discharge: Decreased caregiver support PT Patient demonstrates impairments in the following area(s): Balance;Endurance;Motor;Pain;Sensory PT Transfers Functional Problem(s): Bed Mobility;Bed to Chair;Furniture;Car PT Locomotion Functional Problem(s): Wheelchair Mobility;Ambulation;Stairs PT Plan PT Intensity: Minimum of 1-2 x/day ,45 to 90 minutes PT Frequency: 5 out of 7 days PT Duration Estimated Length of Stay: 14-18 days PT Treatment/Interventions: Ambulation/gait training;Discharge planning;Functional mobility training;Psychosocial support;Therapeutic Activities;Balance/vestibular training;Disease management/prevention;Neuromuscular re-education;Skin care/wound management;Therapeutic Exercise;Wheelchair propulsion/positioning;UE/LE Strength taining/ROM;Splinting/orthotics;Pain management;DME/adaptive equipment instruction;Community reintegration;Patient/family education;Stair training;UE/LE Coordination activities PT Transfers Anticipated Outcome(s): supervision PT Locomotion Anticipated Outcome(s): supervision PT Recommendation Follow Up Recommendations: 24 hour supervision/assistance;Home health PT;Outpatient PT (HHPT vs OPPT) Patient destination: Home Equipment Recommended: To be determined  Skilled Therapeutic Intervention Pt received in w/c in handoff from OT & pt agreeable to tx, denying c/o pain. Manual testing completed, please see below for further details. Pt is currently at a max A level for sit<>stand and stand pivot, but Mod A for squat pivot transfers. Pt requires Max A to ambulate 15 ft with RW demonstrating  significant BLE knee buckling, BLE genu recurvatum, and heavy reliance on arms. Pt with impaired sensation in BLE & decreased coordination limiting his safety with ambulation. Pt able to negotiate single step with B rails & Max A to help correct B knee buckling. Utilized nu-step level 1 x 10 minutes without BUE support & rest breaks as needed for BLE strengthening. PT educated pt on head/hips relationship for transfers & pt with poor carryover. Therapist educated pt on rehab schedule and general information regarding CIR. At end of session pt left in w/c with all needs within reach & set up with lunch tray.   PT Evaluation Precautions/Restrictions Precautions Precautions: Fall Precaution Comments: BLE knee buckling Restrictions Weight Bearing Restrictions: No  General Chart Reviewed: Yes Response to Previous Treatment: Patient with no complaints from previous session. Family/Caregiver Present: No   Pain Pain Assessment Pain Assessment: No/denies pain  Home Living/Prior Functioning Home Living Available Help at Discharge: Available 24 hours/day Type of Home: House Home Access: Stairs to enter [employer] of Steps: 2+2 Entrance Stairs-Rails: None Home Layout: One level  Lives With: Spouse Prior Function Level of Independence: Independent with basic ADLs;Independent with homemaking with ambulation;Independent with transfers;Independent with gait  Able to Take Stairs?: Yes Driving: Yes Vocation: Retired Leisure: Hobbies-yes (Comment) Comments: gardening, working at [employer] wears glasses at all times. Denies changes from baseline.  Cognition Overall Cognitive Status: Within Functional Limits for tasks assessed Arousal/Alertness: Awake/alert Orientation Level: Oriented X4 Attention: Focused Memory: Appears intact Awareness: Appears intact Problem Solving: Appears intact Safety/Judgment: Appears intact  Sensation Sensation Light Touch:  Impaired Detail Light Touch Impaired Details: Impaired LLE;Impaired RLE (decreasing sensation to light touch distal to B knees) Proprioception: Appears Intact (BLE) Coordination Finger Nose Finger Test:  (equal BUE) Heel Shin Test:  (equal time BLE, but impaired coordination)  Motor  Motor Motor:  (signficant BLE weakness, B knee buckling)   Mobility Bed Mobility Bed Mobility: Rolling Right;Rolling Left;Supine to Sit;Sit to Supine (regular bed) Rolling Right: 5: Supervision Rolling Left: 5: Supervision Supine to Sit: 5: Supervision Sit to Supine: 5: Supervision Transfers Transfers: Yes Sit to Stand: 3: Mod assist Sit to Stand Details: Verbal cues for technique;Verbal cues for sequencing (cuing for hand placement on w/c armrests) Stand to Sit: 2: Max assist Stand to Sit Details (indicate cue type and reason): Verbal cues for technique;Verbal cues for precautions/safety;Verbal cues for sequencing Stand Pivot Transfers: 2: Max assist Stand Pivot Transfer Details: Verbal cues for sequencing;Verbal cues for technique Squat Pivot Transfers: 3: Mod assist Squat Pivot Transfer Details: Verbal cues for technique;Verbal cues for sequencing  Locomotion  Ambulation Ambulation: Yes Ambulation/Gait Assistance: 2: Max assist Ambulation Distance (Feet): 15 Feet Assistive device: Rolling walker Gait Gait: Yes Gait Pattern: Decreased step length - right;Decreased step length - left;Right genu recurvatum;Left genu recurvatum (signficant reliance on BUE for support, BLE knee buckling) Stairs / Additional Locomotion Stairs: Yes Stairs Assistance: 2: Max Industrial/product designer Assistance Details: Verbal cues for technique;Verbal cues for sequencing Stair Management Technique: Two rails Number of Stairs: 1 Height of Stairs: 6 (inches) Wheelchair Mobility Wheelchair Mobility: Yes Wheelchair Assistance: 5: Investment banker, operational Details: Verbal cues for Marketing executive: Both  upper extremities Wheelchair Parts Management: Needs assistance Distance: 100 ft  Balance Balance Balance Assessed: Yes Ambulance person Sitting - Balance Support: Bilateral upper extremity supported Static Sitting - Level of Assistance: 5: Stand by Armed forces logistics/support/administrative officer Standing - Balance Support: Bilateral upper extremity supported Static Standing - Level of Assistance: 2: Max assist  Extremity Assessment  RLE Assessment RLE Assessment: Exceptions to [facility] RLE AROM (degrees) Overall AROM Right Lower Extremity: Within functional limits for tasks assessed RLE Strength Right Hip Flexion: 3/5 Right Knee Flexion: 3+/5 Right Knee Extension: 3/5 Right Ankle Dorsiflexion: 2/5 LLE Assessment LLE Assessment: Exceptions to WFL LLE AROM (degrees) Overall AROM Left Lower Extremity: Within functional limits for tasks assessed LLE Strength Left Hip Flexion: 3/5 Left Knee Flexion: 3+/5 Left Knee Extension: 3/5 Left Ankle Dorsiflexion: 2/5   See Function Navigator for Current Functional Status.   Refer to Care Plan for Long Term Goals  Recommendations for other services: None  Discharge Criteria: Patient will be discharged from PT if patient refuses treatment 3 consecutive times without medical reason, if treatment goals not met, if there is a change in medical status, if patient makes no progress towards goals or if patient is discharged from hospital.  The above assessment, treatment plan, treatment alternatives and goals were discussed and mutually agreed upon: by patient  Waunita Schooner 09/27/2015, 11:52 AM

## 2015-09-27 NOTE — Progress Notes (Signed)
Occupational Therapy Session Note  Patient Details  Name: Tanner Mason MRN: [id number] Date of Birth: 08-19-1940  Today's Date: 09/27/2015 OT Individual Time: [id number] OT Individual Time Calculation (min): 95 min     Skilled Therapeutic Interventions/Progress Updates:   Pt rolled himself down to the ADL apartment with use of his UEs in the wheelchair with supervision.  He was able to practice stand pivot transfers to simulated walk-in shower edge with max assist.  Discussed shower seat options and positioning as well as practicing stepping in backwards and then out forward secondary to shower door setup.  Worked on sit to stand in the therapy gym next with emphasis on slow controlled transitions with better knee and hip extension timing.  Initially, pt used a lot of momentum to complete sit to stand with increased knee hyperextension noted and decreased hip extension.  Varied height of mat to work on sit to stand with slower controlled movement.  Mod to max assist needed depending on if pt was allowed to use his UEs.  Progressed to working on standing with the RW while reaching down to knee level for clothespins and then returning to standing to place them on a vertical bar.  Max assist needed to complete 3-4 intervals for 4-5 sets.  Pt with decreased strength for maintaining knee extension in standing as well as maintaining standing balance.  Increased forward lean over walker with LOB as well requiring max assist to correct.  Had pt transfer back to the wheelchair and then roll himself back to the room with use of all extremities.  Transferred to recliner with max assist stand pivot and no assistive device.  Pt's wife present during session and in room at end of session.  Encouraged pt to get up in wheelchair during down time and work on mobility using his LEs as well.    Therapy Documentation Precautions:  Precautions Precautions: Fall Precaution Comments: BLE knee  buckling Restrictions Weight Bearing Restrictions: No  Pain: Pain Assessment Pain Assessment: No/denies pain ADL: See Function Navigator for Current Functional Status.   Therapy/Group: Individual Therapy  Maygen Sirico OTR/L 09/27/2015, 3:54 PM

## 2015-09-28 ENCOUNTER — Inpatient Hospital Stay (HOSPITAL_COMMUNITY): Payer: Medicare Other | Admitting: Occupational Therapy

## 2015-09-28 ENCOUNTER — Inpatient Hospital Stay (HOSPITAL_COMMUNITY): Payer: Medicare Other | Admitting: Physical Therapy

## 2015-09-28 DIAGNOSIS — R198 Other specified symptoms and signs involving the digestive system and abdomen: Secondary | ICD-10-CM | POA: Insufficient documentation

## 2015-09-28 DIAGNOSIS — K639 Disease of intestine, unspecified: Secondary | ICD-10-CM

## 2015-09-28 LAB — CBC WITH DIFFERENTIAL/PLATELET
Basophils Absolute: 0 10*3/uL (ref 0.0–0.1)
Basophils Relative: 0 %
Eosinophils Absolute: 0.2 10*3/uL (ref 0.0–0.7)
Eosinophils Relative: 3 %
HCT: 31 % — ABNORMAL LOW (ref 39.0–52.0)
Hemoglobin: 10.4 g/dL — ABNORMAL LOW (ref 13.0–17.0)
Lymphocytes Relative: 34 %
Lymphs Abs: 1.6 10*3/uL (ref 0.7–4.0)
MCH: 29.8 pg (ref 26.0–34.0)
MCHC: 33.5 g/dL (ref 30.0–36.0)
MCV: 88.8 fL (ref 78.0–100.0)
Monocytes Absolute: 0.5 10*3/uL (ref 0.1–1.0)
Monocytes Relative: 10 %
Neutro Abs: 2.5 10*3/uL (ref 1.7–7.7)
Neutrophils Relative %: 53 %
Platelets: 125 10*3/uL — ABNORMAL LOW (ref 150–400)
RBC: 3.49 MIL/uL — ABNORMAL LOW (ref 4.22–5.81)
RDW: 14.7 % (ref 11.5–15.5)
WBC: 4.7 10*3/uL (ref 4.0–10.5)

## 2015-09-28 LAB — GLUCOSE, CAPILLARY
Glucose-Capillary: 116 mg/dL — ABNORMAL HIGH (ref 65–99)
Glucose-Capillary: 146 mg/dL — ABNORMAL HIGH (ref 65–99)
Glucose-Capillary: 85 mg/dL (ref 65–99)
Glucose-Capillary: 184 mg/dL — ABNORMAL HIGH (ref 65–99)
Glucose-Capillary: 155 mg/dL — ABNORMAL HIGH (ref 65–99)

## 2015-09-28 MED ORDER — INSULIN ASPART 100 UNIT/ML ~~LOC~~ SOLN
0.0000 [IU] | Freq: Three times a day (TID) | SUBCUTANEOUS | Status: DC
  Administered 2015-09-28 – 2015-09-29 (×2): 2 [IU] via SUBCUTANEOUS
  Administered 2015-09-30: 1 [IU] via SUBCUTANEOUS
  Administered 2015-10-01: 2 [IU] via SUBCUTANEOUS
  Administered 2015-10-01 – 2015-10-02 (×3): 1 [IU] via SUBCUTANEOUS
  Administered 2015-10-03: 2 [IU] via SUBCUTANEOUS
  Administered 2015-10-06: 1 [IU] via SUBCUTANEOUS

## 2015-09-28 MED ORDER — INSULIN ASPART 100 UNIT/ML ~~LOC~~ SOLN
10.0000 [IU] | Freq: Three times a day (TID) | SUBCUTANEOUS | Status: DC
  Administered 2015-09-28: 14 [IU] via SUBCUTANEOUS
  Administered 2015-09-29: 10 [IU] via SUBCUTANEOUS
  Administered 2015-09-29 (×2): 14 [IU] via SUBCUTANEOUS
  Administered 2015-09-30: 10 [IU] via SUBCUTANEOUS
  Administered 2015-09-30: 14 [IU] via SUBCUTANEOUS
  Administered 2015-10-01: 10 [IU] via SUBCUTANEOUS
  Administered 2015-10-01 – 2015-10-02 (×2): 14 [IU] via SUBCUTANEOUS
  Administered 2015-10-02: 10 [IU] via SUBCUTANEOUS

## 2015-09-28 MED ORDER — METOPROLOL TARTRATE 50 MG PO TABS
75.0000 mg | ORAL_TABLET | Freq: Two times a day (BID) | ORAL | Status: DC
  Administered 2015-09-28 – 2015-10-09 (×22): 75 mg via ORAL
  Filled 2015-09-28 (×23): qty 1

## 2015-09-28 MED ORDER — TAMSULOSIN HCL 0.4 MG PO CAPS
0.4000 mg | ORAL_CAPSULE | Freq: Every day | ORAL | Status: DC
  Administered 2015-09-29 – 2015-10-08 (×10): 0.4 mg via ORAL
  Filled 2015-09-28 (×10): qty 1

## 2015-09-28 MED ORDER — INSULIN ASPART 100 UNIT/ML ~~LOC~~ SOLN: 0.0000 [IU] | Freq: Every day | SUBCUTANEOUS | Status: DC

## 2015-09-28 MED ORDER — LIRAGLUTIDE 18 MG/3ML ~~LOC~~ SOPN
1.8000 mg | PEN_INJECTOR | Freq: Every day | SUBCUTANEOUS | Status: DC
  Administered 2015-09-28 – 2015-10-09 (×12): 1.8 mg via SUBCUTANEOUS

## 2015-09-28 NOTE — Progress Notes (Signed)
Physical Therapy Session Note  Patient Details  Name: Tanner Mason MRN: [id number] Date of Birth: 1941-03-31  Today's Date: 09/28/2015 PT Individual Time: 1300-1350 PT Individual Time Calculation (min): 50 min   Short Term Goals: Week 1:  PT Short Term Goal 1 (Week 1): Pt will complete squat pivot transfers with steady A. PT Short Term Goal 2 (Week 1): Pt will ambulate 25 ft with LRAD & Min A.  PT Short Term Goal 3 (Week 1): Pt will self propel w/c x 200 ft with BUE for cardiovascular endurance training & supervision.   Skilled Therapeutic Interventions/Progress Updates:    Pt received resting in w/c with no c/o pain and agreeable to therapy.  Session focus on d/c planning, transfers, gait with RW, LE NMR and strengthening.    Pt propelled w/c to ortho gym with 2 short rest breaks using combination of UEs and LEs.  Elevated simulator car to 35" per wife's measurements and discussed challenges of getting into/out of car that height and possibilities for d/c.  Discussed possibility of using pt's daughter's car initially or using a step into car.  Pt's wife states she will ask for measurements for her daughter's car this evening.    PT instructed pt in w/c>mat transfer at 28.5" height (to simulate bed at home) and pt performed stand pivot with RW and mod assist.  PT instructed pt in scooting backwards onto mat before attempting to bring LEs up.  Supine>sit with close supervision.    PT instructed pt in bridging x5 with ball squeeze for increased challenge, SLR x5, hip abd/add to midline x5, and ankle pumps x5.  All exercises performed bilaterally and PT educated pt on performing when not in therapy to continue to build strength.  PT instructed pt in 10 reps of minisquats with verbal cues for equal control of RLE and LLE, and heel raises x5 with mod assist for balance.    Pt performs sit<>stand throughout session with steady assist to gain balance once standing.  Gait training with RW x40'  with max assist, pt demos good carryover of corrections from AM session (hip flexion, correction of BOS), but requires increased cuing when fatigued.    Pt returned to room at end of session and positioned in recliner with min assist squat/pivot transfer, call bell in reach and needs met.   Therapy Documentation Precautions:  Precautions Precautions: Fall Precaution Comments: BLE knee buckling Restrictions Weight Bearing Restrictions: No   See Function Navigator for Current Functional Status.   Therapy/Group: Individual Therapy  Earnest Conroy Penven-Crew 09/28/2015, 3:25 PM

## 2015-09-28 NOTE — Progress Notes (Signed)
[place] PHYSICAL MEDICINE & REHABILITATION     PROGRESS NOTE  Subjective/Complaints:  Pt sitting up in his chair ready to begin therapies.  He had a "great" day yesterday.  He no longer has abdominal discomfort.  He states his throat is sore.    ROS:  Denies CP, SOB, nausea, vomiting, diarrhea.  Objective: Vital Signs: Blood pressure 121/72, pulse 87, temperature 98.7 F (37.1 C), temperature source Oral, resp. rate 18, height 5\' 7"  (1.702 m), weight 70.1 kg (154 lb 8.7 oz), SpO2 100 %. Dg Abd 1 View  09/27/2015  CLINICAL DATA:  Constipation x 2 days, hypogastric pain. No nausea, no pain. No recent surgery Hx of esophagogastroduodenoscopy EXAM: ABDOMEN - 1 VIEW COMPARISON:  CT, 07/03/2007 FINDINGS: Normal bowel gas pattern.  No increase in colonic stool. No evidence of renal or ureteral stones. Soft tissues are unremarkable. There are old healed rib fractures on the right. IMPRESSION: 1. No acute findings.  No increase in colonic stool. Electronically Signed   By: Amie Portlandavid  Ormond M.D.   On: 09/27/2015 09:06    Recent Labs  09/27/15 0743 09/28/15 0556  WBC 4.1 4.7  HGB 10.7* 10.4*  HCT 31.9* 31.0*  PLT 112* 125*    Recent Labs  09/26/15 0746 09/27/15 0743  NA 137 138  K 3.8 3.7  CL 113* 110  GLUCOSE 220* 181*  BUN 34* 28*  CREATININE 1.72* 1.74*  CALCIUM 8.6* 9.0   CBG (last 3)   Recent Labs  09/27/15 1652 09/27/15 2103 09/28/15 0702  GLUCAP 65 105* 116*    Wt Readings from Last 3 Encounters:  09/27/15 70.1 kg (154 lb 8.7 oz)  09/23/15 70.7 kg (155 lb 13.8 oz)  07/14/15 76.204 kg (168 lb)    Physical Exam:  BP 121/72 mmHg  Pulse 87  Temp(Src) 98.7 F (37.1 C) (Oral)  Resp 18  Ht 5\' 7"  (1.702 m)  Wt 70.1 kg (154 lb 8.7 oz)  BMI 24.20 kg/m2  SpO2 100% Constitutional: He appears well-developed and well-nourished. No distress.  HENT: Normocephalic and atraumatic.  No edema, erythema in throat. Eyes: Conjunctivae and EOM are normal. No scleral icterus.   Cardiovascular: Normal rate and regular rhythm.  Respiratory: Effort normal and breath sounds normal. No respiratory distress. He exhibits no tenderness.  GI: Soft. Bowel sounds are normal. He exhibits no distension. There is no tenderness.  Musculoskeletal: He exhibits no edema or tenderness.  Neurological: He is alert and oriented.  Speech clear.  Mild ptosis on left--chronic.  Motor: 4+/5 bilateral deltoids, biceps, triceps, grip 4/5 hip flexors 4/5 knee extensor, 3-/5 ankle dorsiflexor  Sensation decreased to LT below knees bilaterally DTRs 0/2 bilateral knees Skin: Skin is warm and dry. He is not diaphoretic.  Psychiatric: He has a normal mood and affect. His behavior is normal. Judgment and thought content normal   Assessment/Plan: 1. Functional deficits secondary to Guillain Barre Syndrome which require 3+ hours per day of interdisciplinary therapy in a comprehensive inpatient rehab setting. Physiatrist is providing close team supervision and 24 hour management of active medical problems listed below. Physiatrist and rehab team continue to assess barriers to discharge/monitor patient progress toward functional and medical goals.  Function:  Bathing Bathing position   Position: Shower  Bathing parts Body parts bathed by patient: Right arm, Left arm, Chest, Abdomen, Front perineal area, Right upper leg, Left upper leg Body parts bathed by helper: Buttocks, Back  Bathing assist Assist Level:  (Mod assist)  Upper Body Dressing/Undressing Upper body dressing   What is the patient wearing?: Pull over shirt/dress     Pull over shirt/dress - Perfomed by patient: Thread/unthread right sleeve, Thread/unthread left sleeve, Put head through opening, Pull shirt over trunk          Upper body assist Assist Level: Set up   Set up : To obtain clothing/put away  Lower Body Dressing/Undressing Lower body dressing   What is the patient wearing?: Underwear, Pants, Non-skid  slipper socks Underwear - Performed by patient: Thread/unthread left underwear leg Underwear - Performed by helper: Pull underwear up/down, Thread/unthread right underwear leg Pants- Performed by patient: Thread/unthread right pants leg Pants- Performed by helper: Thread/unthread left pants leg, Pull pants up/down Non-skid slipper socks- Performed by patient: Don/doff left sock Non-skid slipper socks- Performed by helper: Don/doff right sock                  Lower body assist Assist for lower body dressing:  (Max assist)      Toileting Toileting     Toileting steps completed by helper: Adjust clothing prior to toileting, Performs perineal hygiene, Adjust clothing after toileting Toileting Assistive Devices: Grab bar or rail  Toileting assist     Transfers Chair/bed transfer   Chair/bed transfer method: Squat pivot Chair/bed transfer assist level: Moderate assist (Pt 50 - 74%/lift or lower) Chair/bed transfer assistive device: Armrests     Locomotion Ambulation     Max distance: 15 ft Assist level: Maximal assist (Pt 25 - 49%)   Wheelchair   Type: Manual Max wheelchair distance: 100 ft Assist Level: Supervision or verbal cues  Cognition Comprehension Comprehension assist level: Follows complex conversation/direction with no assist  Expression Expression assist level: Expresses complex ideas: With no assist  Social Interaction Social Interaction assist level: Interacts appropriately with others - No medications needed.  Problem Solving Problem solving assist level: Solves basic problems with no assist  Memory Memory assist level: Recognizes or recalls 90% of the time/requires cueing < 10% of the time    Medical Problem List and Plan: 1. Functional and mobility deficits secondary to Guillain Barre Syndrome -Neurology following along. Consider IVIG if patient doesn't continue to progress  Cont CIR 2. DVT Prophylaxis/Anticoagulation: Pharmaceutical:  Lovenox 3. Pain Management: on Neurontin effective as symptoms improving.  4. Mood: LCSW to follow for evaluation and support.  5. Neuropsych: This patient is capable of making decisions on his own behalf. 6. Skin/Wound Care: Routine pressure relief measures.  7. Fluids/Electrolytes/Nutrition: Encourage po fluid intake.  8. T2DM with peripheral neuropathy: Uncontrolled --Hgb A1c-7.4.   On lantus 50 units with meal coverage tid. Will adjust depending on trend  Relatively controlled at present 9. Histamine excess:   Monitor for recurrence of angioedema.   Continue to use benadryl/prednisone prn 10. Normocytic anemia:   Added iron supplement  Hb 10.4 on 6/1 11. HTN: Monitor BP and adjust medications as indicated.   Continue metoprolol bid and norvasc.  12. Acute on chronic renal failure:   Encourage po fluids.  Cr. 1.74 on 5/31 13. Diarrhea/Constipation:   ?Stabalizing  KUB 5/31 reviewed, relatively unremarkable. 14. Thrombocytopenia  Plts 125 on 6/1 (improving)  LOS (Days) 2 A FACE TO FACE EVALUATION WAS PERFORMED  Jaylina Ramdass Karis Juba 09/28/2015 8:07 AM

## 2015-09-28 NOTE — Progress Notes (Signed)
[facility] Individual Statement of Services  Patient Name:  Tanner Mason  Date:  09/28/2015  Welcome to the Inpatient Rehabilitation [facility].  Our goal is to provide you with an individualized program based on your diagnosis and situation, designed to meet your specific needs.  With this comprehensive rehabilitation program, you will be expected to participate in at least 3 hours of rehabilitation therapies Monday-Friday, with modified therapy programming on the weekends.  Your rehabilitation program will include the following services:  Physical Therapy (PT), Occupational Therapy (OT), 24 hour per day rehabilitation nursing, Case Management (Social Worker), Rehabilitation Medicine, Nutrition Services and Pharmacy Services  Weekly team conferences will be held on Wednesdays to discuss your progress.  Your Social Worker will talk with you frequently to get your input and to update you on team discussions.  Team conferences with you and your family in attendance may also be held.  Expected length of stay: 14 to 16 days  Overall anticipated outcome: Intermittent supervision; minimal assistance with ambulation  Depending on your progress and recovery, your program may change. Your Social Worker will coordinate services and will keep you informed of any changes. Your Social Worker's name and contact numbers are listed  below.  The following services may also be recommended but are not provided by the Inpatient Rehabilitation Center:   Driving Evaluations  Home Health Rehabiltiation Services  Outpatient Rehabilitation Services   Arrangements will be made to provide these services after discharge if needed.  Arrangements include referral to agencies that provide these services.  Your insurance has been verified to be:  [employer] Your primary doctor is:  Dr. Donnella ShamKyle Mason  Pertinent information will be shared with your doctor and your insurance  company.  Social Worker:  Staci AcostaJenny Ladawna Walgren, LCSW  [phone] or [phone]) [phone]  Information discussed with and copy given to patient by: Elvera LennoxPrevatt, [place] Surgeon C[place], 09/28/2015, 11:09 AM

## 2015-09-28 NOTE — Progress Notes (Signed)
Patient had orthostatic episode with PT--improved with rest. Encourage pushing po fluids and check orthostatic BPs. Will decrease metoprolol to 75 mg bid and monitor.

## 2015-09-28 NOTE — Progress Notes (Signed)
Physical Therapy Session Note  Patient Details  Name: Tanner Mason MRN: [id number] Date of Birth: [birth year]  Today's Date: 09/28/2015 PT Individual Time: 1000-1056 PT Individual Time Calculation (min): 56 min   Short Term Goals: Week 1:  PT Short Term Goal 1 (Week 1): Pt will complete squat pivot transfers with steady A. PT Short Term Goal 2 (Week 1): Pt will ambulate 25 ft with LRAD & Min A.  PT Short Term Goal 3 (Week 1): Pt will self propel w/c x 200 ft with BUE for cardiovascular endurance training & supervision.   Skilled Therapeutic Interventions/Progress Updates:    Pt received resting in w/c in therapy gym and agreeable to session, no c/o pain.  Pt states he feels as if his blood sugar is low, RN alerted and in to assess sugar and vitals.  BP 95/52, HR 96 bpm, and O2 99%.  Pt given cup of water and states he feels mostly recovered following water.   Pt performs sit<>stand throughout session with initial max assist to rise, steady, and control descent, fade to mod assist to rise and steady by end of session.  Pt initially demonstrates excessive trunk/hip extension coming from sit>stand as he attempts to power up and steady himself; this improved with education and practice and pt demonstrates more smooth transition by end of session.    Gait training x15' +15' +20' with RW and max assist.  Initial gait pattern excessive hip extensor thrust to swing LE through, scissoring, and excessive weight shift R<>L.  Verbal cues for focus on hip flexion to clear foot, upright posture, and adjusting foot placement prior to shifting weight in the case of too narrow BOS or scissoring.  Pt takes corrects well and demos good carryover between trials transitioning to mod assist for gait on final trial.   Nustep x10 minutes with LEs only for increased strengthening and NMR for reciprocal stepping pattern.  Pt able to complete at level 4 x10 minutes with no rest breaks.  Pt educated pt on progression of  recovery from GBS and pt asked appropriate questions.    Pt performed side stepping to L and R to transition from w/c<>Nustep with RW and mod/max assist.  Pt propelled w/c back to room at end of session with combination of BUEs and BLEs for NMR and overall endurance/strengthening.    Pt left upright in w/c with call bell in reach and needs met.   Therapy Documentation Precautions:  Precautions Precautions: Fall Precaution Comments: BLE knee buckling Restrictions Weight Bearing Restrictions: No   See Function Navigator for Current Functional Status.   Therapy/Group: Individual Therapy  Earnest Conroy Penven-Crew 09/28/2015, 12:11 PM

## 2015-09-28 NOTE — Progress Notes (Signed)
Occupational Therapy Session Note  Patient Details  Name: Tanner Mason MRN: [id number] Date of Birth: Nov 21, 1940  Today's Date: 09/28/2015 OT Individual Time: 1115-1200 OT Individual Time Calculation (min): 45 min    Short Term Goals: Week 1:  OT Short Term Goal 1 (Week 1): Pt will complete squat pivot to toilet with min assist OT Short Term Goal 2 (Week 1): Pt will complete LB dressing with mod assist at sit > stand level OT Short Term Goal 3 (Week 1): Pt will complete bathing at sit > stand level with min assist OT Short Term Goal 4 (Week 1): Pt will complete 1 grooming activity in standing with min assist for standing balance.  Skilled Therapeutic Interventions/Progress Updates:    Pt seen for skilled OT to facilitate dynamic standing, transfers and sit to stand skills. Pt worked on toilet transfer practice 2x using stand pivot with grab bar. No physical A to stand only to control descent onto seat. Pt cued to watch for avoiding forward knee tracking in sit to stand and to instead emphasize hips back.  Pt then self propelled to gym. Blood pressure assessed in sitting then standing then sitting. First 2 seated readings were very low and then WNL in standing and seated for the 3rd reading.  Pt did not feel symptomatic.  Pt worked on standing balance at parallel bars with hip sways and forward taps. Pt had to focus to move legs in a controlled manner. Pt requested to toilet. Returned to room to transfer to toilet. Pt able to manage clothing over hips with min A to stabilize balance. Pt in room with all needs met.  Therapy Documentation Precautions:  Precautions Precautions: Fall Precaution Comments: BLE knee buckling Restrictions Weight Bearing Restrictions: No Pain: Pain Assessment Pain Assessment: No/denies pain ADL:    See Function Navigator for Current Functional Status.   Therapy/Group: Individual Therapy  [place] 09/28/2015, 12:23 PM

## 2015-09-28 NOTE — Progress Notes (Signed)
1030 09/28/15 nursing RN was called to gym by PT. Per PT  Patient started getting clammy and sweaty. Pt requested his blood sugar be checked cbg was 146; V/S taken 95/52 96 99% in room air. RN asked patient to drink   water. PT gave patient water to drink. Per patient he felt better after drinking the water. Pam PA notified and she said to give water . New orders noted.

## 2015-09-28 NOTE — Progress Notes (Signed)
Occupational Therapy Session Note  Patient Details  Name: Tanner Mason MRN: [id number] Date of Birth: 1940/08/15  Today's Date: 09/28/2015 OT Individual Time: 0900-1000 OT Individual Time Calculation (min): 60 min    Short Term Goals: Week 1:  OT Short Term Goal 1 (Week 1): Pt will complete squat pivot to toilet with min assist OT Short Term Goal 2 (Week 1): Pt will complete LB dressing with mod assist at sit > stand level OT Short Term Goal 3 (Week 1): Pt will complete bathing at sit > stand level with min assist OT Short Term Goal 4 (Week 1): Pt will complete 1 grooming activity in standing with min assist for standing balance.  Skilled Therapeutic Interventions/Progress Updates:    1:1 self care retraining at shower level. Focus on squat pivot transfers with minimal assistance w/c<>tub bench in shower. Pt able to perform washing bottom today in standing position with one Ue support and mod A for maintaining balance. Pt dressed sitting in the w/c with focus on sit to stand without UE support with max A and tactile cues for maintaining forward weight shift. Focus on trunk/ core strengthening during maintaining standing balance without UE support with verbal cues to find and maintain midline with mod to max A. Able to transition to maintaining standing balance with only min A while performing hair brushing. Also focus on control descents with standing to sitting without UE support and min to mod A for control. Able to stand again to complete tooth brushing with max A to come into standing and then min to maintain balance with one UE support. Transitioned to gym to practice squat pivot transfers.  After practice pt able to perform transfer with steady A to supervision. Left in the gym in prep for next therapy.   Therapy Documentation Precautions:  Precautions Precautions: Fall Precaution Comments: BLE knee buckling Restrictions Weight Bearing Restrictions: No Pain: Pain Assessment Pain  Assessment: No/denies pain Pain Score: 0-No pain  See Function Navigator for Current Functional Status.   Therapy/Group: Individual Therapy  Roney MansSmith, [name] [name] 09/28/2015, 10:45 PM

## 2015-09-29 ENCOUNTER — Inpatient Hospital Stay (HOSPITAL_COMMUNITY): Payer: Medicare Other | Admitting: Physical Therapy

## 2015-09-29 ENCOUNTER — Inpatient Hospital Stay (HOSPITAL_COMMUNITY): Payer: Medicare Other | Admitting: Occupational Therapy

## 2015-09-29 DIAGNOSIS — K5901 Slow transit constipation: Secondary | ICD-10-CM

## 2015-09-29 LAB — BASIC METABOLIC PANEL
Anion gap: 6 (ref 5–15)
BUN: 24 mg/dL — ABNORMAL HIGH (ref 6–20)
CO2: 24 mmol/L (ref 22–32)
Calcium: 9 mg/dL (ref 8.9–10.3)
Chloride: 108 mmol/L (ref 101–111)
Creatinine, Ser: 1.61 mg/dL — ABNORMAL HIGH (ref 0.61–1.24)
GFR calc Af Amer: 47 mL/min — ABNORMAL LOW (ref >60)
GFR calc non Af Amer: 41 mL/min — ABNORMAL LOW (ref >60)
Glucose, Bld: 123 mg/dL — ABNORMAL HIGH (ref 65–99)
Potassium: 3.7 mmol/L (ref 3.5–5.1)
Sodium: 138 mmol/L (ref 135–145)

## 2015-09-29 LAB — GLUCOSE, CAPILLARY
Glucose-Capillary: 160 mg/dL — ABNORMAL HIGH (ref 65–99)
Glucose-Capillary: 90 mg/dL (ref 65–99)
Glucose-Capillary: 104 mg/dL — ABNORMAL HIGH (ref 65–99)
Glucose-Capillary: 71 mg/dL (ref 65–99)

## 2015-09-29 MED ORDER — SENNOSIDES-DOCUSATE SODIUM 8.6-50 MG PO TABS
1.0000 | ORAL_TABLET | Freq: Two times a day (BID) | ORAL | Status: DC
  Administered 2015-09-29 (×2): 1 via ORAL
  Filled 2015-09-29 (×2): qty 1

## 2015-09-29 NOTE — Progress Notes (Signed)
[facility] PHYSICAL MEDICINE & REHABILITATION     PROGRESS NOTE  Subjective/Complaints:  Pt sitting up in his chair this AM.  He is sore from therapies yesterday.  He states he had an orthostatic episode yesterday x1, but feels fine otherwise.  He also notes constipation.  ROS:  +Contsitpation.  Denies CP, SOB, nausea, vomiting, diarrhea.  Objective: Vital Signs: Blood pressure 128/67, pulse 82, temperature 97.5 F (36.4 C), temperature source Oral, resp. rate 18, height  (1.702 m), weight 70.1 kg (154 lb 8.7 oz), SpO2 99 %. Dg Abd 1 View  09/27/2015  CLINICAL DATA:  Constipation x 2 days, hypogastric pain. No nausea, no pain. No recent surgery Hx of esophagogastroduodenoscopy EXAM: ABDOMEN - 1 VIEW COMPARISON:  CT, 07/03/2007 FINDINGS: Normal bowel gas pattern.  No increase in colonic stool. No evidence of renal or ureteral stones. Soft tissues are unremarkable. There are old healed rib fractures on the right. IMPRESSION: 1. No acute findings.  No increase in colonic stool. Electronically Signed   By: Amie Portland M.D.   On: 09/27/2015 09:06    Recent Labs  09/27/15 0743 09/28/15 0556  WBC 4.1 4.7  HGB 10.7* 10.4*  HCT 31.9* 31.0*  PLT 112* 125*    Recent Labs  09/27/15 0743 09/29/15 0442  NA 138 138  K 3.7 3.7  CL 110 108  GLUCOSE 181* 123*  BUN 28* 24*  CREATININE 1.74* 1.61*  CALCIUM 9.0 9.0   CBG (last 3)   Recent Labs  09/28/15 1638 09/28/15 2106 09/29/15 0641  GLUCAP 184* 155* 160*    Wt Readings from Last 3 Encounters:  09/27/15 70.1 kg (154 lb 8.7 oz)  09/23/15 70.7 kg (155 lb 13.8 oz)  07/14/15 76.204 kg (168 lb)    Physical Exam:  BP 128/67 mmHg  Pulse 82  Temp(Src) 97.5 F (36.4 C) (Oral)  Resp 18  Ht  (1.702 m)  Wt 70.1 kg (154 lb 8.7 oz)  BMI 24.20 kg/m2  SpO2 99% Constitutional: He appears well-developed and well-nourished. No distress.  HENT: Normocephalic and atraumatic.  No edema, erythema in throat. Eyes: Conjunctivae  and EOM are normal. No scleral icterus.  Cardiovascular: Normal rate and regular rhythm.  Respiratory: Effort normal and breath sounds normal. No respiratory distress. He exhibits no tenderness.  GI: Soft. Bowel sounds are normal. He exhibits no distension. There is no tenderness.  Musculoskeletal: He exhibits no edema or tenderness.  Neurological: He is alert and oriented.  Speech clear.  Mild ptosis on left--chronic.  Motor: 4+/5 bilateral deltoids, biceps, triceps, grip 4/5 hip flexors 4/5 knee extensor, 3-/5 ankle dorsiflexor  Sensation decreased to LT below knees bilaterally Skin: Skin is warm and dry. He is not diaphoretic.  Psychiatric: He has a normal mood and affect. His behavior is normal. Judgment and thought content normal   Assessment/Plan: 1. Functional deficits secondary to Guillain Barre Syndrome which require 3+ hours per day of interdisciplinary therapy in a comprehensive inpatient rehab setting. Physiatrist is providing close team supervision and 24 hour management of active medical problems listed below. Physiatrist and rehab team continue to assess barriers to discharge/monitor patient progress toward functional and medical goals.  Function:  Bathing Bathing position   Position: Shower  Bathing parts Body parts bathed by patient: Right arm, Left arm, Chest, Abdomen, Front perineal area, Right upper leg, Left upper leg, Right lower leg, Left lower leg, Buttocks Body parts bathed by helper: Back  Bathing assist Assist Level: Touching or steadying  assistance(Pt > 75%)      Upper Body Dressing/Undressing Upper body dressing   What is the patient wearing?: Pull over shirt/dress     Pull over shirt/dress - Perfomed by patient: Thread/unthread right sleeve, Thread/unthread left sleeve, Put head through opening, Pull shirt over trunk          Upper body assist Assist Level: Set up   Set up : To obtain clothing/put away  Lower Body Dressing/Undressing Lower  body dressing   What is the patient wearing?: Underwear, Pants, Socks, Shoes Underwear - Performed by patient: Thread/unthread right underwear leg, Thread/unthread left underwear leg, Pull underwear up/down Underwear - Performed by helper: Pull underwear up/down, Thread/unthread right underwear leg Pants- Performed by patient: Thread/unthread right pants leg, Thread/unthread left pants leg, Pull pants up/down Pants- Performed by helper: Thread/unthread left pants leg, Pull pants up/down Non-skid slipper socks- Performed by patient: Don/doff left sock Non-skid slipper socks- Performed by helper: Don/doff right sock Socks - Performed by patient: Don/doff right sock Socks - Performed by helper: Don/doff left sock Shoes - Performed by patient: Don/doff right shoe, Don/doff left shoe Shoes - Performed by helper: Fasten right, Fasten left          Lower body assist Assist for lower body dressing: Touching or steadying assistance (Pt > 75%)      Toileting Toileting   Toileting steps completed by patient: Adjust clothing prior to toileting, Performs perineal hygiene, Adjust clothing after toileting Toileting steps completed by helper: Adjust clothing prior to toileting, Performs perineal hygiene, Adjust clothing after toileting Toileting Assistive Devices: Grab bar or rail  Toileting assist Assist level: Touching or steadying assistance (Pt.75%)   Transfers Chair/bed transfer   Chair/bed transfer method: Stand pivot Chair/bed transfer assist level: Moderate assist (Pt 50 - 74%/lift or lower) Chair/bed transfer assistive device: Armrests, Patent attorney     Max distance: 30 Assist level: Maximal assist (Pt 25 - 49%)   Wheelchair   Type: Manual Max wheelchair distance: 150 Assist Level: Supervision or verbal cues  Cognition Comprehension Comprehension assist level: Follows complex conversation/direction with no assist  Expression Expression assist level: Expresses  complex ideas: With no assist  Social Interaction Social Interaction assist level: Interacts appropriately with others - No medications needed.  Problem Solving Problem solving assist level: Solves basic problems with no assist  Memory Memory assist level: Recognizes or recalls 90% of the time/requires cueing < 10% of the time    Medical Problem List and Plan: 1. Functional and mobility deficits secondary to Guillain Barre Syndrome -Neurology following along. Consider IVIG if patient doesn't continue to progress  Cont CIR 2. DVT Prophylaxis/Anticoagulation: Pharmaceutical: Lovenox 3. Pain Management: on Neurontin effective as symptoms improving.  4. Mood: LCSW to follow for evaluation and support.  5. Neuropsych: This patient is capable of making decisions on his own behalf. 6. Skin/Wound Care: Routine pressure relief measures.  7. Fluids/Electrolytes/Nutrition: Encourage po fluid intake.  8. T2DM with peripheral neuropathy: Uncontrolled --Hgb A1c-7.4.   On lantus 50 units with meal coverage tid. Will adjust depending on trend  Relatively controlled at present 9. Histamine excess:   Monitor for recurrence of angioedema.   Continue to use benadryl/prednisone prn 10. Normocytic anemia:   Added iron supplement  Hb 10.4 on 6/1 11. HTN: Monitor BP and adjust medications as indicated.   Continue metoprolol bid, decreased to 75 on 6/1  Cont norvasc.  12. Acute on chronic renal failure:   Encourage po fluids.  Cr. 1.61 on 6/2 13. Diarrhea/Constipation:   KUB 5/31 reviewed, relatively unremarkable.  Bowel meds increased on 6/2 14. Thrombocytopenia  Plts 125 on 6/1 (improving)  LOS (Days) 3 A FACE TO FACE EVALUATION WAS PERFORMED  Sihaam Chrobak Karis Jubanil Darlinda Bellows 09/29/2015 8:20 AM

## 2015-09-29 NOTE — Progress Notes (Signed)
Occupational Therapy Session Note  Patient Details  Name: Tanner Mason MRN: [id number] Date of Birth: May 02, 1940  Today's Date: 09/29/2015 OT Individual Time:  -   1000-1100  (60 min)      Short Term Goals: Week 1:  OT Short Term Goal 1 (Week 1): Pt will complete squat pivot to toilet with min assist OT Short Term Goal 2 (Week 1): Pt will complete LB dressing with mod assist at sit > stand level OT Short Term Goal 3 (Week 1): Pt will complete bathing at sit > stand level with min assist OT Short Term Goal 4 (Week 1): Pt will complete 1 grooming activity in standing with min assist for standing balance.  Skilled Therapeutic Interventions/Progress Updates:    Pt sitting in wc upon OT arrival.  Performed toilet transfer  bathroom with rail and min assist.   Pt. Propelled wc to gym.  Transferred to mat with mod assist.  Engaged in UE exercises using 1.5 # weights for shoulders in all planes.  Did plank in prone for 20 sec x3.  Did work core in supine with bridges, holding, bicycle.  Did sit to stand x5 ; squats x5 for 2 sets.  Pt. Transferred to wc and propelled back to room.  Left in wc with all needs in reach.     Therapy Documentation Precautions:  Precautions Precautions: Fall Precaution Comments: BLE knee buckling Restrictions Weight Bearing Restrictions: No    Vital Signs: Therapy Vitals Pulse Rate: 85 BP: 131/66 mmHg Pain: Pain Assessment Pain Assessment: No/denies pain        See Function Navigator for Current Functional Status.   Therapy/Group: Individual Therapy  Humberto Sealsdwards, Hillari Zumwalt J 09/29/2015, 10:59 AM

## 2015-09-29 NOTE — Progress Notes (Signed)
Physical Therapy Session Note  Patient Details  Name: Tanner Mason MRN: [id number] Date of Birth: 10-Sep-1940  Today's Date: 09/29/2015 PT Individual Time: [id number] and 1300-1400  PT Individual Time Calculation (min): 85 min and 60 min (total 145 min)  Short Term Goals: Week 1:  PT Short Term Goal 1 (Week 1): Pt will complete squat pivot transfers with steady A. PT Short Term Goal 2 (Week 1): Pt will ambulate 25 ft with LRAD & Min A.  PT Short Term Goal 3 (Week 1): Pt will self propel w/c x 200 ft with BUE for cardiovascular endurance training & supervision.   Skilled Therapeutic Interventions/Progress Updates:    Tx 1: Pt received seated in w/c, denies pain however does report some abdominal discomfort and constipation. W/c propulsion x200' with BUE and S for strengthening and endurance. Gait 2x56', 1x85' with RW and min/modA. Scissoring gait pattern with LLE frequently catching on R shoe during swing phase. Sit <>stand from elevated mat table at 21 inches, 3 sets 5 reps with no UE support. Requires min/modA for stability once standing, cues for L knee control to reduce recurvatum, and one major LOB on last rep requiring maxA to return to sitting on mat table. Quadruped 3x 1 min with cues for abdominal draw in maneuver, maintaining hips over knees to improve core activation. Tall kneeling x2 min with light LUE support while RUE engaged in game on white board. 3# weight ball performing diagonal chops B sides 2x10 each side. Trunk rotation ball pass with therapist 2x10 each direction. Stairs 2x8 at 3" height with B handrails and min guard/minA; one major LOB with LLE buckling however pt able to recover with use of BUEs on rails and minA from therapist. W/c propulsion back to room with BUE and S x200'. Remained seated in w/c at completion of session, all needs within reach.   Tx 2: Pt received seated in w/c, denies pain and agreeable to treatment. Does report excessive fatigue from busy day of  therapy. W/c propulsion to/from gym with BUE and S for strengthening and endurance. Seated and supine LE strengthening exercises 2 sets 15 reps each with pt given handout for performance in room independently outside of regular therapy sessions. Discussed progression with reps/sets/ankle weights. Attempted sit <>stand from elevated mat table for LE strengthening, however pt requires maxA due to excessive fatigue and only able to perform 1 rep. Per pt preference, performed Nustep instead due to weakness and increased falls risk while pt is fatigued. Performed 9 min on level 5 initially, decreased to level 3 as pt fatigued. Pt's wife reports pt went to bathroom without assist earlier due to urgency; discussed possibility of performing hands on training for wife to allow her to be checked off to assist however wife reports she does not feel comfortable assisting yet as he is still very weak. Pt returned to room as above; transferred to be with close S squat pivot. Remained supine in bed at completion of session, all needs within reach.   Therapy Documentation Precautions:  Precautions Precautions: Fall Precaution Comments: BLE knee buckling Restrictions Weight Bearing Restrictions: No  See Function Navigator for Current Functional Status.   Therapy/Group: Individual Therapy  Vista Lawmanlizabeth J Tygielski 09/29/2015, 2:17 PM

## 2015-09-29 NOTE — Progress Notes (Signed)
Social Work Patient ID: Tanner Mason, male   DOB: 06-13-1940, 75 y.o.   MRN: [id number]   CSW met with pt and his wife to update them on team conference discussion and targeted d/c date.  Pt is eager to go home, but knows he needs to be on Rehab for now.  CSW explained that we will continue to re-evaluate and can change that date if pt progresses faster than anticipated.  They expressed understanding and are committed to pt working hard and getting stronger.  CSW will continue to follow and assist as needed.

## 2015-09-29 NOTE — Patient Care Conference (Signed)
Inpatient RehabilitationTeam Conference and Plan of Care Update Date: 09/27/2015   Time: 2:10 PM    Patient Name: Tanner Mason      Medical Record Number: [id number]  Date of Birth: Sep 22, 1940 Sex: Male         Room/Bed: 4W05C/4W05C-01 Payor Info: Payor: Advertising copywriter MEDICARE / Plan: [facility] MEDICARE / Product Type: *No Product type* /    Admitting Diagnosis: Famlyts  GBS  Admit Date/Time:  09/26/2015  6:47 PM Admission Comments: No comment available   Primary Diagnosis:  Guillain Barr syndrome (HCC) Principal Problem: Guillain Barr syndrome Tanner Mason)  Patient Active Problem List   Diagnosis Date Noted  . Irregular bowel habits   . Constipation   . Thrombocytopenia (HCC)   . Acute on chronic renal insufficiency (HCC)   . Normocytic anemia   . Type 2 diabetes mellitus with peripheral neuropathy (HCC)   . Bilateral leg weakness   . Idiopathic angioedema   . Leg weakness   . Back pain   . Hypomagnesemia   . Type 2 diabetes mellitus with diabetic neuropathy, unspecified (HCC)   . Guillain Barr syndrome (HCC)   . Weakness 09/17/2015  . Gait abnormality 09/17/2015  . Gait instability 09/17/2015  . Cellulitis and abscess 07/14/2015  . Preventative health care 11/08/2014  . Angio-edema 07/14/2014  . Angioedema 07/13/2014  . Left foot drop 04/05/2014  . Lumbar back pain with radiculopathy affecting left lower extremity 04/05/2014  . Atypical chest pain 12/14/2013  . Hypokalemia 03/16/2013  . Overweight (BMI 25.0-29.9) 09/15/2012  . Stage 3 chronic renal impairment associated with type 2 diabetes mellitus (HCC) 07/20/2010  . ULNAR NERVE ENTRAPMENT, RIGHT 06/07/2008  . Diabetes mellitus (HCC) 12/09/2007  . Essential hypertension, benign 12/08/2007  . DIASTASIS RECTI 12/08/2007  . HYPERLIPIDEMIA 06/26/2006  . ANEMIA, OTHER, UNSPECIFIED 06/26/2006  . Peripheral autonomic neuropathy due to diabetes mellitus (HCC) 06/26/2006  . GASTROESOPHAGEAL REFLUX DISEASE, CHRONIC  06/26/2006  . Enlarged prostate with lower urinary tract symptoms (LUTS) 06/26/2006    Expected Discharge Date: Expected Discharge Date: 10/13/15  Team Members Present: Physician leading conference: Dr. Maryla Morrow Social Worker Present: Staci Acosta, LCSW Nurse Present: Kennon Portela, RN PT Present: Katherine Mantle, PT;Caitlin Penven-Crew, PT;Austin Pricilla Holm, PT;Victoria Hyacinth Meeker, PT OT Present: Callie Fielding, OT SLP Present: Feliberto Gottron, SLP PPS Coordinator present : Tora Duck, RN, CRRN     Current Status/Progress Goal Weekly Team Focus  Medical   Functional and mobility deficits secondary to [facility] Syndrome  Improve mobility, safety, transfers  See above   Bowel/Bladder   Continent Bowel and bladder  To be continent of B&B  Monitor B&B function    Swallow/Nutrition/ Hydration             ADL's   mod assist squat pivot transfers, max assist LB dressing  Supervision/setup - Mod I  ADL retraining, transfers, sit > stand, pt/family education   Mobility   mod/max A for transfers (squat pivot, sit<>stand & stand pivot), supervision for w/c mobility x 100 ft, max a to ambulate 15 ft with RW  supervision for transfers, min A for household ambulation goals, mod I for w/c in community  BLE strengtening, balance, gait, neuro re-ed   Communication             Safety/Cognition/ Behavioral Observations            Pain   No c/o pain   Pain <3  Assess and treat pain as needed during shift   Skin  No skin breakdown. CDI  No skin breakdown/infection  Assess skin during every shift    Rehab Goals Patient on target to meet rehab goals: Yes Rehab Goals Revised: none - pt's first conference *See Care Plan and progress notes for long and short-term goals.  Barriers to Discharge: DM, AKI/CKD, thrombocytopenia, anemia, diarrhea/constipation    Possible Resolutions to Barriers:  Optimize DM meds, follow renal function, follow labs, KUB    Discharge Planning/Teaching Needs:  Pt  plans to return to his home at d/c with his wife and dtr to provide 24/7 supervision with some minimal assistance.  Pt's wife/dtr will come for family education closer to d/c.   Team Discussion:  Dr. Allena KatzPatel restarted diabetes medications and is awaiting pt's labs.  He also ordered an abdominal x-ray due to pt's cycle of constipation, diarrhea, constipation, etc.  Pt is mod to max with transfers with PT.  He is getting some sensation back in his feet and is very motivated to rehab.  RN stated that pt has a rash on his back that itches.  RN to try hypoallergenic sheets and OT got pt in the shower today, so hopefully that will help, but Dr. Allena KatzPatel to keep monitoring.  Revisions to Treatment Plan:  none   Continued Need for Acute Rehabilitation Level of Care: The patient requires daily medical management by a physician with specialized training in physical medicine and rehabilitation for the following conditions: Daily direction of a multidisciplinary physical rehabilitation program to ensure safe treatment while eliciting the highest outcome that is of practical value to the patient.: Yes Daily medical management of patient stability for increased activity during participation in an intensive rehabilitation regime.: Yes Daily analysis of laboratory values and/or radiology reports with any subsequent need for medication adjustment of medical intervention for : Neurological problems;Renal problems;Other  Tania Perrott, Vista DeckJennifer Capps 09/29/2015, 11:59 AM

## 2015-09-30 ENCOUNTER — Inpatient Hospital Stay (HOSPITAL_COMMUNITY): Payer: Medicare Other | Admitting: Physical Therapy

## 2015-09-30 ENCOUNTER — Inpatient Hospital Stay (HOSPITAL_COMMUNITY): Payer: Medicare Other | Admitting: Occupational Therapy

## 2015-09-30 DIAGNOSIS — I10 Essential (primary) hypertension: Secondary | ICD-10-CM

## 2015-09-30 LAB — GLUCOSE, CAPILLARY
Glucose-Capillary: 135 mg/dL — ABNORMAL HIGH (ref 65–99)
Glucose-Capillary: 100 mg/dL — ABNORMAL HIGH (ref 65–99)
Glucose-Capillary: 68 mg/dL (ref 65–99)
Glucose-Capillary: 77 mg/dL (ref 65–99)
Glucose-Capillary: 90 mg/dL (ref 65–99)

## 2015-09-30 MED ORDER — SENNOSIDES-DOCUSATE SODIUM 8.6-50 MG PO TABS
2.0000 | ORAL_TABLET | Freq: Two times a day (BID) | ORAL | Status: DC
  Administered 2015-09-30 – 2015-10-09 (×18): 2 via ORAL
  Filled 2015-09-30 (×19): qty 2

## 2015-09-30 MED ORDER — AMLODIPINE BESYLATE 5 MG PO TABS
5.0000 mg | ORAL_TABLET | Freq: Every day | ORAL | Status: DC
  Administered 2015-09-30 – 2015-10-09 (×9): 5 mg via ORAL
  Filled 2015-09-30 (×9): qty 1

## 2015-09-30 NOTE — Progress Notes (Signed)
Hypoglycemic Event  CBG:68  Treatment: 15 GM carbohydrate snack  Symptoms: None  Follow-up CBG: Time:1716 CBG Result:77   Possible Reasons for Event: Unknown  Comments/MD notified: Dr. Allena KatzPatel aware. Came up with snack. Asymptomatic. Dinner on the unit. No new orders.    Tanner Mason, Tanner Mason

## 2015-09-30 NOTE — Progress Notes (Signed)
Physical Therapy Session Note  Patient Details  Name: Tanner Mason MRN: [id number] Date of Birth: 11/09/1940  Today's Date: 09/30/2015 PT Individual Time: 0930-1030, 1300-1330 PT Individual Time Calculation (min): 60 min, 45  Short Term Goals: Week 1:  PT Short Term Goal 1 (Week 1): Pt will complete squat pivot transfers with steady A. PT Short Term Goal 2 (Week 1): Pt will ambulate 25 ft with LRAD & Min A.  PT Short Term Goal 3 (Week 1): Pt will self propel w/c x 200 ft with BUE for cardiovascular endurance training & supervision.   Skilled Therapeutic Interventions/Progress Updates:   Pt demonstrates improvement initiating 6" steps today for stair training. Pt also demonstrates improvement initiating free gait activities. Pt does fatigue at end of day with increased knee instability noted in standing activities without UE support. Pt would continue to benefit from skilled PT services to increase functional mobility.  Therapy Documentation Precautions:  Precautions Precautions: Fall Precaution Comments: BLE knee buckling Restrictions Weight Bearing Restrictions: No Pain: Pain Assessment Pain Assessment: 0-10 Pain Score: 4  Pain Location: Back Pain Intervention(s):  (graded activity) Mobility:  Mod A with cues for weight shift for transfers Locomotion :   Mod A with RW 9090' with cues for posture; Min A for stairs with cues for posture, sequencing Other Treatments:  Tx 1: Pt performs seated anterior weight shifts, standing lateral weight shifts, 2x10. Pt performs standing BLE advancement pre-gait 2x5. Pt educated on rehab plan, safety in mobility, progressing mobility, core control and forced use. Gastroc stretch 2x30". Prone lying on elbows 2x2'. Abd myofascial release. Free gait 5'x2 with cues for weight shift and core control. Pt performs dual motor tasks including reaching, grasping, and LE manipulation within functional context.  Tx 2: Pt performs TKE with orange band, LAQs  with ECC control, heel raises 2x10. Pt performs sitting and standing dynamic sitting balance tasks with UE manipulation within functional context. Pt performs static standing 1'x2. Pt performs crawling laterally, forward and backward 5'x2. Pt performs standing mini squats x10.   See Function Navigator for Current Functional Status.   Therapy/Group: Individual Therapy  Christia ReadingKinney, Tanner Mason 09/30/2015, 9:52 AM

## 2015-09-30 NOTE — Progress Notes (Signed)
[facility] PHYSICAL MEDICINE & REHABILITATION     PROGRESS NOTE  Subjective/Complaints:  Patient sitting up in bed this morning. He states that he started to feel constipated again. He notes that he had a bowel movement yesterday, but believes it was unsatisfactory.  ROS:  +Contsitpation.  Denies CP, SOB, nausea, vomiting, diarrhea.  Objective: Vital Signs: Blood pressure 130/68, pulse 85, temperature 97.9 F (36.6 C), temperature source Oral, resp. rate 20, height 5\' 7"  (1.702 m), weight 70.1 kg (154 lb 8.7 oz), SpO2 98 %. No results found.  Recent Labs  09/27/15 0743 09/28/15 0556  WBC 4.1 4.7  HGB 10.7* 10.4*  HCT 31.9* 31.0*  PLT 112* 125*    Recent Labs  09/27/15 0743 09/29/15 0442  NA 138 138  K 3.7 3.7  CL 110 108  GLUCOSE 181* 123*  BUN 28* 24*  CREATININE 1.74* 1.61*  CALCIUM 9.0 9.0   CBG (last 3)   Recent Labs  09/29/15 1648 09/29/15 2048 09/30/15 0647  GLUCAP 104* 71 135*    Wt Readings from Last 3 Encounters:  09/27/15 70.1 kg (154 lb 8.7 oz)  09/23/15 70.7 kg (155 lb 13.8 oz)  07/14/15 76.204 kg (168 lb)    Physical Exam:  BP 130/68 mmHg  Pulse 85  Temp(Src) 97.9 F (36.6 C) (Oral)  Resp 20  Ht 5\' 7"  (1.702 m)  Wt 70.1 kg (154 lb 8.7 oz)  BMI 24.20 kg/m2  SpO2 98% Constitutional: He appears well-developed and well-nourished. No distress.  HENT: Normocephalic and atraumatic.  No edema, erythema in throat. Eyes: Conjunctivae and EOM are normal. No scleral icterus.  Cardiovascular: Normal rate and regular rhythm.  Respiratory: Effort normal and breath sounds normal. No respiratory distress. He exhibits no tenderness.  GI: Soft. Bowel sounds are normal. He exhibits no distension. There is no tenderness.  Musculoskeletal: He exhibits no edema or tenderness.  Neurological: He is alert and oriented.  Speech clear.  Mild ptosis on left--chronic.  Motor: 4+/5 bilateral deltoids, biceps, triceps, grip 4/5 hip flexors 4/5 knee extensor,  3-/5 ankle dorsiflexor  Sensation decreased to LT below knees bilaterally Skin: Skin is warm and dry. He is not diaphoretic.  Psychiatric: He has a normal mood and affect. His behavior is normal. Judgment and thought content normal   Assessment/Plan: 1. Functional deficits secondary to Guillain Barre Syndrome which require 3+ hours per day of interdisciplinary therapy in a comprehensive inpatient rehab setting. Physiatrist is providing close team supervision and 24 hour management of active medical problems listed below. Physiatrist and rehab team continue to assess barriers to discharge/monitor patient progress toward functional and medical goals.  Function:  Bathing Bathing position   Position: Shower  Bathing parts Body parts bathed by patient: Right arm, Left arm, Chest, Abdomen, Front perineal area, Right upper leg, Left upper leg, Right lower leg, Left lower leg, Buttocks Body parts bathed by helper: Back  Bathing assist Assist Level: Touching or steadying assistance(Pt > 75%)      Upper Body Dressing/Undressing Upper body dressing   What is the patient wearing?: Pull over shirt/dress     Pull over shirt/dress - Perfomed by patient: Thread/unthread right sleeve, Thread/unthread left sleeve, Put head through opening, Pull shirt over trunk          Upper body assist Assist Level: Set up   Set up : To obtain clothing/put away  Lower Body Dressing/Undressing Lower body dressing   What is the patient wearing?: Underwear, Pants, Socks, Shoes Underwear -  Performed by patient: Thread/unthread right underwear leg, Thread/unthread left underwear leg, Pull underwear up/down Underwear - Performed by helper: Pull underwear up/down, Thread/unthread right underwear leg Pants- Performed by patient: Thread/unthread right pants leg, Thread/unthread left pants leg, Pull pants up/down Pants- Performed by helper: Thread/unthread left pants leg, Pull pants up/down Non-skid slipper socks-  Performed by patient: Don/doff left sock Non-skid slipper socks- Performed by helper: Don/doff right sock Socks - Performed by patient: Don/doff right sock Socks - Performed by helper: Don/doff left sock Shoes - Performed by patient: Don/doff right shoe, Don/doff left shoe Shoes - Performed by helper: Fasten right, Fasten left          Lower body assist Assist for lower body dressing: Touching or steadying assistance (Pt > 75%)      Toileting Toileting   Toileting steps completed by patient: Adjust clothing prior to toileting, Adjust clothing after toileting Toileting steps completed by helper: Performs perineal hygiene Toileting Assistive Devices: Grab bar or rail  Toileting assist Assist level: Touching or steadying assistance (Pt.75%)   Transfers Chair/bed transfer   Chair/bed transfer method: Stand pivot Chair/bed transfer assist level: Moderate assist (Pt 50 - 74%/lift or lower) Chair/bed transfer assistive device: Armrests, Patent attorney     Max distance: 85 Assist level: Moderate assist (Pt 50 - 74%)   Wheelchair   Type: Manual Max wheelchair distance: 200 Assist Level: Supervision or verbal cues  Cognition Comprehension Comprehension assist level: Understands complex 90% of the time/cues 10% of the time  Expression Expression assist level: Expresses complex 90% of the time/cues < 10% of the time  Social Interaction Social Interaction assist level: Interacts appropriately 90% of the time - Needs monitoring or encouragement for participation or interaction.  Problem Solving Problem solving assist level: Solves complex 90% of the time/cues < 10% of the time  Memory Memory assist level: Recognizes or recalls 90% of the time/requires cueing < 10% of the time    Medical Problem List and Plan: 1. Functional and mobility deficits secondary to Guillain Barre Syndrome -Neurology following along. Consider IVIG if patient doesn't continue to  progress  Cont CIR 2. DVT Prophylaxis/Anticoagulation: Pharmaceutical: Lovenox 3. Pain Management: on Neurontin effective as symptoms improving.  4. Mood: LCSW to follow for evaluation and support.  5. Neuropsych: This patient is capable of making decisions on his own behalf. 6. Skin/Wound Care: Routine pressure relief measures.  7. Fluids/Electrolytes/Nutrition: Encourage po fluid intake.  8. T2DM with peripheral neuropathy: Uncontrolled --Hgb A1c-7.4.   On lantus 50 units with meal coverage tid. Will adjust depending on trend  Relatively controlled at present 9. Histamine excess:   Monitor for recurrence of angioedema.   Continue to use benadryl/prednisone prn 10. Normocytic anemia:   Added iron supplement  Hb 10.4 on 6/1 11. HTN: Monitor BP and adjust medications as indicated.   Continue metoprolol bid, decreased to 75 on 6/1  Norvasc decreased to .  Within normal limits at present 12. Acute on chronic renal failure:   Encourage po fluids.  Cr. 1.61 on 6/2 13. Diarrhea/Constipation:   KUB 5/31 reviewed, relatively unremarkable.  Bowel meds increased on 6/2, again on 6/3 14. Thrombocytopenia  Plts 125 on 6/1 (improving)  LOS (Days) 4 A FACE TO FACE EVALUATION WAS PERFORMED  Tanner Mason Tanner Mason 09/30/2015 7:01 AM

## 2015-09-30 NOTE — Progress Notes (Signed)
Occupational Therapy Session Note  Patient Details  Name: Tanner Mason MRN: [id number] Date of Birth: 01-Jul-1940  Today's Date: 09/30/2015 OT Individual Time: 1430-1600 OT Individual Time Calculation (min): 90 min    Short Term Goals: Week 1:  OT Short Term Goal 1 (Week 1): Pt will complete squat pivot to toilet with min assist OT Short Term Goal 2 (Week 1): Pt will complete LB dressing with mod assist at sit > stand level OT Short Term Goal 3 (Week 1): Pt will complete bathing at sit > stand level with min assist OT Short Term Goal 4 (Week 1): Pt will complete 1 grooming activity in standing with min assist for standing balance.  Skilled Therapeutic Interventions/Progress Updates:    Upon entering the room, pt seated in wheelchair with wife present in room. Pt with no c/o pain and motivated for OT intervention. Pt propelled wheelchair into dayroom with supervision. Pt ambulated multiple bouts in dayroom throughout session on carpeted surface with RW and mod A for balance 35',100, 25' respectively. Pt transferring to mat and performing 5 stands to controlled sits with min A for balance and without use of UE. Pt also practicing transitional movements of turn to sit in both directions as pt narrows base of support and decreases safety. Pt returning to room where he ambulated 10' with RW and mod A into bathroom to sit onto TTB to remove clothing items. Shower performed seated on TTB with lateral leans to clean buttocks. Pt requiring frequent rest breaks secondary to fatigue this session. Pt dressing from TTB and education regarding energy conservation with self care. OT providing energy conservation handouts for pt to read over weekend and to discuss further in next OT session. Pt seated in wheelchair with call bell and wife present in room. All needed items within reach.   Therapy Documentation Precautions:  Precautions Precautions: Fall Precaution Comments: BLE knee  buckling Restrictions Weight Bearing Restrictions: No Vital Signs: Therapy Vitals Temp: 97.7 F (36.5 C) Temp Source: Oral Pulse Rate: 94 Resp: 18 BP: 116/68 mmHg Patient Position (if appropriate): Sitting Oxygen Therapy SpO2: 100 % O2 Device: Not Delivered  See Function Navigator for Current Functional Status.   Therapy/Group: Individual Therapy  Lowella Gripittman, Danahi Reddish L 09/30/2015, 4:23 PM

## 2015-10-01 ENCOUNTER — Inpatient Hospital Stay (HOSPITAL_COMMUNITY): Payer: Medicare Other

## 2015-10-01 LAB — GLUCOSE, CAPILLARY
Glucose-Capillary: 157 mg/dL — ABNORMAL HIGH (ref 65–99)
Glucose-Capillary: 88 mg/dL (ref 65–99)
Glucose-Capillary: 140 mg/dL — ABNORMAL HIGH (ref 65–99)
Glucose-Capillary: 152 mg/dL — ABNORMAL HIGH (ref 65–99)

## 2015-10-01 MED ORDER — BISACODYL 10 MG RE SUPP
10.0000 mg | Freq: Once | RECTAL | Status: AC
  Administered 2015-10-01: 10 mg via RECTAL
  Filled 2015-10-01: qty 1

## 2015-10-01 MED ORDER — POLYETHYLENE GLYCOL 3350 17 G PO PACK
17.0000 g | PACK | Freq: Two times a day (BID) | ORAL | Status: DC
  Administered 2015-10-01 – 2015-10-04 (×4): 17 g via ORAL
  Filled 2015-10-01 (×7): qty 1

## 2015-10-01 NOTE — Plan of Care (Signed)
Problem: RH BOWEL ELIMINATION Goal: RH STG MANAGE BOWEL WITH ASSISTANCE STG Manage Bowel with min Assistance.  Outcome: Not Progressing Patient had bm but claims he still needs to have more request for laxative today

## 2015-10-01 NOTE — Progress Notes (Signed)
Physical Therapy Note  Patient Details  Name: Tanner RueCharles A Sklar MRN: [id number] Date of Birth: 1941-04-07 Today's Date: 10/01/2015  1525-1600  35 min individual 1600-1610, 10 min concurrent  Pain: none noted  W/c propulsion to gym with supervision.  Multimodal cues for more efficient strokes, turns and avoiding leaving hands on rims between strokes. Nu Step at level 4 x 10 min rated 12 on BORG scale, for alternating reciprocal movements and trunk rotation.  Therapeutic activity: practicing sit>< stand with improved forward wt shift, head/hips position and stand> sit for eccentric control.  Gait with RW x 90' with 2 L turns, mod assist; pt very fatigued by 90' and needing to sit quickly.    neuro re-ed : in slight squat to manipulate bean bags on bench in front of him, tolerating 20 seconds, then 30 seconds.  Pt limited by poor muscular endurance.  Supine: 10 x 3 bil bridging focusing on core/hip activation to produce smooth movements; L side lying : 10 x 1 hip flexion/extension bil.  W/c propulsion to return to room with wife.   Val Farnam 10/01/2015, 12:56 PM

## 2015-10-01 NOTE — Progress Notes (Signed)
[facility] PHYSICAL MEDICINE & REHABILITATION     PROGRESS NOTE  Subjective/Complaints:  Patient lying in bed this morning. He complains that he still not had a BM.  ROS:  +Contsitpation.  Denies CP, SOB, nausea, vomiting, diarrhea.  Objective: Vital Signs: Blood pressure 121/60, pulse 91, temperature 98.6 F (37 C), temperature source Oral, resp. rate 20, height 5\' 7"  (1.702 m), weight 70.1 kg (154 lb 8.7 oz), SpO2 98 %. No results found. No results for input(s): WBC, HGB, HCT, PLT in the last 72 hours.  Recent Labs  09/29/15 0442  NA 138  K 3.7  CL 108  GLUCOSE 123*  BUN 24*  CREATININE 1.61*  CALCIUM 9.0   CBG (last 3)   Recent Labs  09/30/15 1651 09/30/15 1716 09/30/15 2032  GLUCAP 68 77 90    Wt Readings from Last 3 Encounters:  09/27/15 70.1 kg (154 lb 8.7 oz)  09/23/15 70.7 kg (155 lb 13.8 oz)  07/14/15 76.204 kg (168 lb)    Physical Exam:  BP 121/60 mmHg  Pulse 91  Temp(Src) 98.6 F (37 C) (Oral)  Resp 20  Ht 5\' 7"  (1.702 m)  Wt 70.1 kg (154 lb 8.7 oz)  BMI 24.20 kg/m2  SpO2 98% Constitutional: He appears well-developed and well-nourished. No distress.  HENT: Normocephalic and atraumatic.  No edema, erythema in throat. Eyes: Conjunctivae and EOM are normal. No scleral icterus.  Cardiovascular: Normal rate and regular rhythm.  Respiratory: Effort normal and breath sounds normal. No respiratory distress. He exhibits no tenderness.  GI: Soft. Bowel sounds are normal. He exhibits no distension. There is no tenderness.  Musculoskeletal: He exhibits no edema or tenderness.  Neurological: He is alert and oriented.  Speech clear.  Mild ptosis on left--chronic.  Motor: 4+/5 bilateral deltoids, biceps, triceps, grip 4/5 hip flexors 4/5 knee extensor, 3-/5 ankle dorsiflexor  Sensation decreased to LT below knees bilaterally Skin: Skin is warm and dry. He is not diaphoretic.  Psychiatric: He has a normal mood and affect. His behavior is normal. Judgment  and thought content normal   Assessment/Plan: 1. Functional deficits secondary to Guillain Barre Syndrome which require 3+ hours per day of interdisciplinary therapy in a comprehensive inpatient rehab setting. Physiatrist is providing close team supervision and 24 hour management of active medical problems listed below. Physiatrist and rehab team continue to assess barriers to discharge/monitor patient progress toward functional and medical goals.  Function:  Bathing Bathing position   Position: Shower  Bathing parts Body parts bathed by patient: Right arm, Left arm, Chest, Abdomen, Front perineal area, Right upper leg, Left upper leg, Right lower leg, Left lower leg, Buttocks Body parts bathed by helper: Back  Bathing assist Assist Level: Touching or steadying assistance(Pt > 75%)      Upper Body Dressing/Undressing Upper body dressing   What is the patient wearing?: Pull over shirt/dress     Pull over shirt/dress - Perfomed by patient: Thread/unthread right sleeve, Thread/unthread left sleeve, Put head through opening, Pull shirt over trunk          Upper body assist Assist Level: Set up   Set up : To obtain clothing/put away  Lower Body Dressing/Undressing Lower body dressing   What is the patient wearing?: Underwear, Pants, Non-skid slipper socks Underwear - Performed by patient: Thread/unthread right underwear leg, Thread/unthread left underwear leg, Pull underwear up/down Underwear - Performed by helper: Pull underwear up/down, Thread/unthread right underwear leg Pants- Performed by patient: Thread/unthread right pants leg, Thread/unthread left  pants leg, Pull pants up/down Pants- Performed by helper: Thread/unthread left pants leg, Pull pants up/down Non-skid slipper socks- Performed by patient: Don/doff right sock, Don/doff left sock Non-skid slipper socks- Performed by helper: Don/doff right sock Socks - Performed by patient: Don/doff right sock Socks - Performed by  helper: Don/doff left sock Shoes - Performed by patient: Don/doff right shoe, Don/doff left shoe Shoes - Performed by helper: Fasten right, Fasten left          Lower body assist Assist for lower body dressing: Touching or steadying assistance (Pt > 75%)      Toileting Toileting   Toileting steps completed by patient: Adjust clothing prior to toileting, Performs perineal hygiene Toileting steps completed by helper: Adjust clothing prior to toileting, Adjust clothing after toileting Toileting Assistive Devices: Grab bar or rail  Toileting assist Assist level: Touching or steadying assistance (Pt.75%)   Transfers Chair/bed transfer   Chair/bed transfer method: Stand pivot Chair/bed transfer assist level: Moderate assist (Pt 50 - 74%/lift or lower) Chair/bed transfer assistive device: Armrests, Patent attorney     Max distance: 100 Assist level: Moderate assist (Pt 50 - 74%)   Wheelchair   Type: Manual Max wheelchair distance: 200 Assist Level: Supervision or verbal cues  Cognition Comprehension Comprehension assist level: Follows basic conversation/direction with no assist  Expression Expression assist level: Expresses complex 90% of the time/cues < 10% of the time  Social Interaction Social Interaction assist level: Interacts appropriately 90% of the time - Needs monitoring or encouragement for participation or interaction.  Problem Solving Problem solving assist level: Solves complex 90% of the time/cues < 10% of the time  Memory Memory assist level: Recognizes or recalls 90% of the time/requires cueing < 10% of the time    Medical Problem List and Plan: 1. Functional and mobility deficits secondary to Guillain Barre Syndrome -Neurology following along. Consider IVIG if patient doesn't continue to progress  Cont CIR 2. DVT Prophylaxis/Anticoagulation: Pharmaceutical: Lovenox 3. Pain Management: on Neurontin effective as symptoms improving.   4. Mood: LCSW to follow for evaluation and support.  5. Neuropsych: This patient is capable of making decisions on his own behalf. 6. Skin/Wound Care: Routine pressure relief measures.  7. Fluids/Electrolytes/Nutrition: Encourage po fluid intake.  8. T2DM with peripheral neuropathy: Uncontrolled --Hgb A1c-7.4.   On lantus 50 units with meal coverage tid. Will adjust depending on trend  Relatively controlled at present 9. Histamine excess:   Monitor for recurrence of angioedema.   Continue to use benadryl/prednisone prn 10. Normocytic anemia:   Added iron supplement  Hb 10.4 on 6/1 11. HTN: Monitor BP and adjust medications as indicated.   Continue metoprolol bid, decreased to 75 on 6/1  Norvasc decreased to .  Within normal limits at present 12. Acute on chronic renal failure:   Encourage po fluids.  Cr. 1.61 on 6/2 13. Diarrhea/Constipation:   KUB 5/31 reviewed, relatively unremarkable.  Bowel meds increased on 6/2, again on 6/3, reviewed again on 6/4 14. Thrombocytopenia  Plts 125 on 6/1 (improving)  LOS (Days) 5 A FACE TO FACE EVALUATION WAS PERFORMED  Bladimir Auman Karis Juba 10/01/2015 6:47 AM

## 2015-10-02 ENCOUNTER — Encounter (HOSPITAL_COMMUNITY): Payer: Medicare Other

## 2015-10-02 ENCOUNTER — Inpatient Hospital Stay (HOSPITAL_COMMUNITY): Payer: Medicare Other | Admitting: Physical Therapy

## 2015-10-02 ENCOUNTER — Inpatient Hospital Stay (HOSPITAL_COMMUNITY): Payer: Medicare Other | Admitting: Occupational Therapy

## 2015-10-02 LAB — GLUCOSE, CAPILLARY
Glucose-Capillary: 141 mg/dL — ABNORMAL HIGH (ref 65–99)
Glucose-Capillary: 129 mg/dL — ABNORMAL HIGH (ref 65–99)
Glucose-Capillary: 58 mg/dL — ABNORMAL LOW (ref 65–99)
Glucose-Capillary: 59 mg/dL — ABNORMAL LOW (ref 65–99)
Glucose-Capillary: 100 mg/dL — ABNORMAL HIGH (ref 65–99)
Glucose-Capillary: 82 mg/dL (ref 65–99)
Glucose-Capillary: 159 mg/dL — ABNORMAL HIGH (ref 65–99)

## 2015-10-02 MED ORDER — INSULIN ASPART 100 UNIT/ML ~~LOC~~ SOLN
10.0000 [IU] | Freq: Three times a day (TID) | SUBCUTANEOUS | Status: DC
  Administered 2015-10-03: 10 [IU] via SUBCUTANEOUS

## 2015-10-02 MED ORDER — GLUCOSE 40 % PO GEL: 1.0000 | Freq: Once | ORAL | Status: DC

## 2015-10-02 MED ORDER — GLUCOSE 40 % PO GEL
ORAL | Status: AC
  Administered 2015-10-02: 37.5 g
  Filled 2015-10-02: qty 1

## 2015-10-02 NOTE — Progress Notes (Signed)
[facility] PHYSICAL MEDICINE & REHABILITATION     PROGRESS NOTE  Subjective/Complaints:  Pt sitting up in his chair this AM.  He believes his constipation has resolved.  He also notes improvement in strength.  ROS:  Denies CP, SOB, nausea, vomiting, diarrhea.  Objective: Vital Signs: Blood pressure 113/61, pulse 91, temperature 98.2 F (36.8 C), temperature source Oral, resp. rate 18, height  (1.702 m), weight 70.1 kg (154 lb 8.7 oz), SpO2 98 %. No results found. No results for input(s): WBC, HGB, HCT, PLT in the last 72 hours. No results for input(s): NA, K, CL, GLUCOSE, BUN, CREATININE, CALCIUM in the last 72 hours.  Invalid input(s): CO CBG (last 3)   Recent Labs  10/01/15 1637 10/01/15 2025 10/02/15 0647  GLUCAP 140* 152* 141*    Wt Readings from Last 3 Encounters:  09/27/15 70.1 kg (154 lb 8.7 oz)  09/23/15 70.7 kg (155 lb 13.8 oz)  07/14/15 76.204 kg (168 lb)    Physical Exam:  BP 113/61 mmHg  Pulse 91  Temp(Src) 98.2 F (36.8 C) (Oral)  Resp 18  Ht  (1.702 m)  Wt 70.1 kg (154 lb 8.7 oz)  BMI 24.20 kg/m2  SpO2 98% Constitutional: He appears well-developed and well-nourished. No distress.  HENT: Normocephalic and atraumatic.   Eyes: Conjunctivae and EOM are normal. No scleral icterus.  Cardiovascular: Normal rate and regular rhythm.  Respiratory: Effort normal and breath sounds normal. No respiratory distress. He exhibits no tenderness.  GI: Soft. Bowel sounds are normal. He exhibits no distension. There is no tenderness.  Musculoskeletal: He exhibits no edema or tenderness.  Neurological: He is alert and oriented.  Speech clear.  Mild ptosis on left--chronic.  Motor: 4+/5 bilateral deltoids, biceps, triceps, grip 4/5 hip flexors 4/5 knee extensor, 4-/5 ankle dorsiflexor  Sensation decreased to LT below knees bilaterally Skin: Skin is warm and dry. He is not diaphoretic.  Psychiatric: He has a normal mood and affect. His behavior is normal.  Judgment and thought content normal   Assessment/Plan: 1. Functional deficits secondary to Guillain Barre Syndrome which require 3+ hours per day of interdisciplinary therapy in a comprehensive inpatient rehab setting. Physiatrist is providing close team supervision and 24 hour management of active medical problems listed below. Physiatrist and rehab team continue to assess barriers to discharge/monitor patient progress toward functional and medical goals.  Function:  Bathing Bathing position   Position: Shower  Bathing parts Body parts bathed by patient: Right arm, Left arm, Chest, Abdomen, Front perineal area, Right upper leg, Left upper leg, Right lower leg, Left lower leg, Buttocks Body parts bathed by helper: Back  Bathing assist Assist Level: Touching or steadying assistance(Pt > 75%)      Upper Body Dressing/Undressing Upper body dressing   What is the patient wearing?: Pull over shirt/dress     Pull over shirt/dress - Perfomed by patient: Thread/unthread right sleeve, Thread/unthread left sleeve, Put head through opening, Pull shirt over trunk          Upper body assist Assist Level: Set up   Set up : To obtain clothing/put away  Lower Body Dressing/Undressing Lower body dressing   What is the patient wearing?: Underwear, Pants, Non-skid slipper socks Underwear - Performed by patient: Thread/unthread right underwear leg, Thread/unthread left underwear leg, Pull underwear up/down Underwear - Performed by helper: Pull underwear up/down, Thread/unthread right underwear leg Pants- Performed by patient: Thread/unthread right pants leg, Thread/unthread left pants leg, Pull pants up/down Pants- Performed by  helper: Thread/unthread left pants leg, Pull pants up/down Non-skid slipper socks- Performed by patient: Don/doff right sock, Don/doff left sock Non-skid slipper socks- Performed by helper: Don/doff right sock Socks - Performed by patient: Don/doff right sock Socks -  Performed by helper: Don/doff left sock Shoes - Performed by patient: Don/doff right shoe, Don/doff left shoe Shoes - Performed by helper: Fasten right, Fasten left          Lower body assist Assist for lower body dressing: Touching or steadying assistance (Pt > 75%)      Toileting Toileting   Toileting steps completed by patient: Performs perineal hygiene Toileting steps completed by helper: Adjust clothing prior to toileting, Adjust clothing after toileting Toileting Assistive Devices: Grab bar or rail  Toileting assist Assist level: Touching or steadying assistance (Pt.75%)   Transfers Chair/bed transfer   Chair/bed transfer method: Stand pivot Chair/bed transfer assist level: Moderate assist (Pt 50 - 74%/lift or lower) Chair/bed transfer assistive device: Armrests, Patent attorneyWalker     Locomotion Ambulation     Max distance: 90 Assist level: Moderate assist (Pt 50 - 74%)   Wheelchair   Type: Manual Max wheelchair distance: 200 Assist Level: Supervision or verbal cues  Cognition Comprehension Comprehension assist level: Understands complex 90% of the time/cues 10% of the time  Expression Expression assist level: Expresses complex 90% of the time/cues < 10% of the time  Social Interaction Social Interaction assist level: Interacts appropriately 90% of the time - Needs monitoring or encouragement for participation or interaction.  Problem Solving Problem solving assist level: Solves complex 90% of the time/cues < 10% of the time  Memory Memory assist level: Recognizes or recalls 90% of the time/requires cueing < 10% of the time    Medical Problem List and Plan: 1. Functional and mobility deficits secondary to Guillain Barre Syndrome -Neurology following along.   Cont CIR 2. DVT Prophylaxis/Anticoagulation: Pharmaceutical: Lovenox 3. Pain Management: on Neurontin effective as symptoms improving.  4. Mood: LCSW to follow for evaluation and support.  5.  Neuropsych: This patient is capable of making decisions on his own behalf. 6. Skin/Wound Care: Routine pressure relief measures.  7. Fluids/Electrolytes/Nutrition: Encourage po fluid intake.  8. T2DM with peripheral neuropathy: Uncontrolled --Hgb A1c-7.4.   On lantus 50 units with meal coverage tid. Will adjust depending on trend  Continues to be relatively controlled 9. Histamine excess:   Monitor for recurrence of angioedema.   Continue to use benadryl/prednisone prn 10. Normocytic anemia:   Added iron supplement  Hb 10.4 on 6/1  Labs ordered for tomorrow 11. HTN: Monitor BP and adjust medications as indicated.   Continue metoprolol bid, decreased to 75 on 6/1  Norvasc decreased to 5mg .  Within normal limits at present 12. Acute on chronic renal failure:   Encourage po fluids.  Cr. 1.61 on 6/2  Labs ordered for tomorrow 13. Diarrhea/Constipation:   KUB 5/31 reviewed, relatively unremarkable.  Bowel meds increased on 6/2, again on 6/3, reviewed again on 6/4  Appears to have resolved 14. Thrombocytopenia  Plts 125 on 6/1 (improving)  Labs ordered for tomorrow  LOS (Days) 6 A FACE TO FACE EVALUATION WAS PERFORMED  Zeplin Aleshire Karis Jubanil Mairen Wallenstein 10/02/2015 8:13 AM

## 2015-10-02 NOTE — Progress Notes (Signed)
Occupational Therapy Session Note  Patient Details  Name: Tanner Mason MRN: [id number] Date of Birth: Feb 22, 1941  Today's Date: 10/02/2015 OT Individual Time: [phone] and 1300-1415 OT Individual Time Calculation (min): 57 min and 75 minutes 15 missed minutes in afternoon session  Short Term Goals: Week 1:  OT Short Term Goal 1 (Week 1): Pt will complete squat pivot to toilet with min assist OT Short Term Goal 2 (Week 1): Pt will complete LB dressing with mod assist at sit > stand level OT Short Term Goal 3 (Week 1): Pt will complete bathing at sit > stand level with min assist OT Short Term Goal 4 (Week 1): Pt will complete 1 grooming activity in standing with min assist for standing balance.  Skilled Therapeutic Interventions/Progress Updates:    Session 1: Upon entering the room, pt seated in wheelchair awaiting therapist arrival. Pt with no c/o pain this session. Skilled OT session with focus on self care retraining, safety, and energy conservation with self care. Pt ambulated into bathroom with RW and steady assist to toilet. Pt having BM this session and able to perform clothing management and hygiene with steady assist with B UEs unsupported. Pt ambulating to shower for bathing while seated on TTB. Pt performed lateral lean in order to clean buttocks with shower requiring intermittent supervision. Pt donning underwear and pants from TTB for energy conservation. Pt donning B socks and shoes this session by crossing one leg over the other. OT continued to educate pt on energy conservation throughout session and referring back to educational handouts previously given. Pt seated in wheelchair with all needs within reach and RN arriving with medications as OT exited the room.   Session 2: Upon entering the room, pt seated in wheelchair with wife present in room. Skilled OT intervention with focus on community mobility, functional transfers, energy conservation, and pt/family education. Pt  verbalizing need for toileting and ambulated into bathroom with steady assist and use of RW. Pt having performs clothing management and hygiene with steady assist and toilet transfer onto elevated toilet seat with steady assist as well for safety. Pt returning to wheelchair and OT propelling pt secondary to energy conservation. Pt ambulates 75' on carpeted surface in gift shop with use of RW and min A for safety. Pt reaching upward to obtain items as well as squat to obtain items from lower shelves with increased knee instability noted. Pt ambulating in tight spaces and needing to side step with RW with min cues for proper technique.  Pt returning back to wheelchair at time of fatigue to rest. OT continued education regarding pt's performance with current task, energy conservation, and progress towards current goals. Pt verbalized understanding. Pt began to ambulated again but verbalized feeling unwell. Pt returned to room with RN notified. BP taken with results being 96/49 and blood glucose was 58. Pt returned to bed and RN arriving. 15 missed minutes missed. Call bell and all needed items within reach upon exiting the room.   Therapy Documentation Precautions:  Precautions Precautions: Fall Precaution Comments: BLE knee buckling Restrictions Weight Bearing Restrictions: No Vital Signs: Therapy Vitals Temp: 98.2 F (36.8 C) Temp Source: Oral Resp: 18 Oxygen Therapy SpO2: 98 % O2 Device: Not Delivered  See Function Navigator for Current Functional Status.   Therapy/Group: Individual Therapy  Lowella Gripittman, Josphine Laffey L 10/02/2015, 8:59 AM

## 2015-10-02 NOTE — Progress Notes (Signed)
Physical Therapy Session Note  Patient Details  Name: Tanner Mason MRN: [id number] Date of Birth: December 21, 1940  Today's Date: 10/02/2015 PT Individual Time: 1000-1100 PT Individual Time Calculation (min): 60 min   Short Term Goals: Week 1:  PT Short Term Goal 1 (Week 1): Pt will complete squat pivot transfers with steady A. PT Short Term Goal 2 (Week 1): Pt will ambulate 25 ft with LRAD & Min A.  PT Short Term Goal 3 (Week 1): Pt will self propel w/c x 200 ft with BUE for cardiovascular endurance training & supervision.   Skilled Therapeutic Interventions/Progress Updates:    Pt received resting in w/c, no c/o pain, and agreeable to therapy session.  Session focus on LE strengthening and NMR, activity tolerance, transfers, gait, and d/c planning.    Pt transfers throughout session with supervision for squat pivot, and steady assist with RW for stand/pivot and ambulatory transfers.  PT instructed pt in car transfer at simulated van height with RW and steady assist with verbal cues for sequencing.  Gait training x80' with RW and close supervision fade to steady assist with fatigue at start of session and  x115' with RW and supervision fade to steady assist at end of session.    PT instructed pt in floor transfer with min/mod assist to come to standing from half kneeling position 2/2 weakness.  Pt engaged in NMR/strengthening activity on floor mat in tall kneeling x2, progress to half kneeling with RLE forward and LLE forward while throwing bean bags.    Pt returned to room in w/c at end of session and positioned with call bell in reach and needs met.   Therapy Documentation Precautions:  Precautions Precautions: Fall Precaution Comments: BLE knee buckling Restrictions Weight Bearing Restrictions: No   See Function Navigator for Current Functional Status.   Therapy/Group: Individual Therapy  Alizah Sills E Penven-Crew 10/02/2015, 12:08 PM

## 2015-10-02 NOTE — Progress Notes (Signed)
Recreational Therapy Assessment and Plan  Patient Details  Name: Tanner Mason MRN: [id number] Date of Birth: 1940-09-01 Today's Date: 10/02/2015  Rehab Potential: Good ELOS: 10 days   Assessment Clinical Impression:  Problem List:  Patient Active Problem List   Diagnosis Date Noted  . Constipation   . Thrombocytopenia ([place])   . Acute on chronic renal insufficiency (HCC)   . Normocytic anemia   . Type 2 diabetes mellitus with peripheral neuropathy (HCC)   . Bilateral leg weakness   . Idiopathic angioedema   . Leg weakness   . Back pain   . Hypomagnesemia   . Type 2 diabetes mellitus with diabetic neuropathy, unspecified ([place])   . Guillain Barr syndrome ([place])   . Weakness 09/17/2015  . Gait abnormality 09/17/2015  . Gait instability 09/17/2015  . Cellulitis and abscess 07/14/2015  . Preventative health care 11/08/2014  . Angio-edema 07/14/2014  . Angioedema 07/13/2014  . Left foot drop 04/05/2014  . Lumbar back pain with radiculopathy affecting left lower extremity 04/05/2014  . Atypical chest pain 12/14/2013  . Hypokalemia 03/16/2013  . Overweight (BMI 25.0-29.9) 09/15/2012  . Stage 3 chronic renal impairment associated with type 2 diabetes mellitus ([place]) 07/20/2010  . ULNAR NERVE ENTRAPMENT, RIGHT 06/07/2008  . Diabetes mellitus ([place]) 12/09/2007  . Essential hypertension, benign 12/08/2007  . DIASTASIS RECTI 12/08/2007  . HYPERLIPIDEMIA 06/26/2006  . ANEMIA, OTHER, UNSPECIFIED 06/26/2006  . Peripheral autonomic neuropathy due to diabetes mellitus ([place]) 06/26/2006  . GASTROESOPHAGEAL REFLUX DISEASE, CHRONIC 06/26/2006  . Enlarged prostate with lower urinary tract symptoms (LUTS) 06/26/2006    Past Medical History:  Past Medical History  Diagnosis Date  . Elbow dislocation [id number]    Right  . Treadmill stress test negative for angina  pectoris 06/2002    poor HR and BP recovery  . Abdominal ultrasound, abnormal 02/2004    fatty liver  . Encounter for diagnostic endoscopy 10/22/04    negative  . History of esophagogastroduodenoscopy 08/13/2007    normal  . Diabetes mellitus without complication [facility])    Past Surgical History:  Past Surgical History  Procedure Laterality Date  . Inguinal hernia repair Right 1947  . Anterior cruciate ligament repair  5/98    Left, staph infection complicated  . Blepharoplasty  03/2005    with ptosis repair  . Basal cell carcinoma excision  02/2005    R ear  . Inguinal hernia repair Left 1970    Assessment & Plan Clinical Impression: Patient is a 75 y.o. year old male with history of DMT2 with neuropathy and gait disorder who was admitted on 09/17/15 with numbness of feet with worsening balance and progressive weakness. Had been seen in the ED few days before with negative work up but continued to worsen and was found to be areflexic with work up consistent with GBS. MRI lumbar spine with mild to moderate stenosis L4/5 and L5/S1 and LP with elevated glucose, elevated protein and elevated IgG. Cultures negative. He was started on PLEX with improvement in strength with ability to stand--last PLEX yesterday. He had developed angioedema (history of excessive histamines) that improved with prednisone and benadryl. He has had diarrhea in past 24 hours felt to be due to laxatives. Patient transferred to CIR on 09/26/2015.      Pt presents with decreased activity tolerance, decreased functional mobility, decreased balance, decreased coordination Limiting pt's independence with leisure/community pursuits.   Leisure History/Participation Premorbid leisure interest/current participation: Medical laboratory scientific officer - Building control surveyor - Doctor, hospital - Travel (  Comment);Sports - Exercise (Comment);Sports - Swimming (go to [employer]  days/wk) Expression Interests: Music (Comment) Other Leisure Interests: Television;Movies;Reading Leisure Participation Style: With Family/Friends Awareness of Community Resources: Excellent Psychosocial / Spiritual Spiritual Interests: [place] interaction - Mood/Behavior: Cooperative Academic librarian Appropriate for Education?: Yes Patient Agreeable to [employer]?: Yes Recreational Therapy Orientation Orientation -Reviewed with patient: Available activity resources Strengths/Weaknesses Patient Strengths/Abilities: Willingness to participate;Active premorbidly Patient weaknesses: Physical limitations TR Patient demonstrates impairments in the following area(s): Endurance;Motor;Safety;Sensory;Skin Integrity  Plan Rec Therapy Plan Is patient appropriate for Therapeutic Recreation?: Yes Rehab Potential: Good Treatment times per week: Min 1 time for community reintegration Estimated Length of Stay: 10 days TR Treatment/Interventions: Adaptive equipment instruction;Balance/vestibular training;Functional mobility training;Community reintegration;Patient/family education;Therapeutic activities;Recreation/leisure participation;Therapeutic exercise;UE/LE Coordination activities;Wheelchair propulsion/positioning  Recommendations for other services: None  Discharge Criteria: Patient will be discharged from TR if patient refuses treatment 3 consecutive times without medical reason.  If treatment goals not met, if there is a change in medical status, if patient makes no progress towards goals or if patient is discharged from hospital.  The above assessment, treatment plan, treatment alternatives and goals were discussed and mutually agreed upon: by patient  [place] 10/02/2015, 2:48 PM

## 2015-10-02 NOTE — Progress Notes (Signed)
Another hypoglycemic episode after lunch--will reduce lunch coverage to 10 units

## 2015-10-02 NOTE — Progress Notes (Addendum)
Hypoglycemic Event  CBG: 58  Treatment: 15 GM gel  Symptoms: None  Follow-up CBG: Time:1450 CBG Result:59  Possible Reasons for Event: Unknown  Comments/MD notified:P Love, PA    Tanner Mason, Tanner Mason  Patient was given a snack at 1455, at 1547 his CBG was 100

## 2015-10-03 ENCOUNTER — Inpatient Hospital Stay (HOSPITAL_COMMUNITY): Payer: Medicare Other | Admitting: Physical Therapy

## 2015-10-03 ENCOUNTER — Inpatient Hospital Stay (HOSPITAL_COMMUNITY): Payer: Medicare Other

## 2015-10-03 ENCOUNTER — Inpatient Hospital Stay (HOSPITAL_COMMUNITY): Payer: Medicare Other | Admitting: Occupational Therapy

## 2015-10-03 LAB — CBC WITH DIFFERENTIAL/PLATELET
Basophils Absolute: 0 10*3/uL (ref 0.0–0.1)
Basophils Relative: 0 %
Eosinophils Absolute: 0.1 10*3/uL (ref 0.0–0.7)
Eosinophils Relative: 2 %
HCT: 28.7 % — ABNORMAL LOW (ref 39.0–52.0)
Hemoglobin: 9.5 g/dL — ABNORMAL LOW (ref 13.0–17.0)
Lymphocytes Relative: 40 %
Lymphs Abs: 1.7 10*3/uL (ref 0.7–4.0)
MCH: 29.3 pg (ref 26.0–34.0)
MCHC: 33.1 g/dL (ref 30.0–36.0)
MCV: 88.6 fL (ref 78.0–100.0)
Monocytes Absolute: 0.4 10*3/uL (ref 0.1–1.0)
Monocytes Relative: 9 %
Neutro Abs: 2 10*3/uL (ref 1.7–7.7)
Neutrophils Relative %: 49 %
Platelets: 142 10*3/uL — ABNORMAL LOW (ref 150–400)
RBC: 3.24 MIL/uL — ABNORMAL LOW (ref 4.22–5.81)
RDW: 14.2 % (ref 11.5–15.5)
WBC: 4.2 10*3/uL (ref 4.0–10.5)

## 2015-10-03 LAB — BASIC METABOLIC PANEL
Anion gap: 10 (ref 5–15)
BUN: 18 mg/dL (ref 6–20)
CO2: 24 mmol/L (ref 22–32)
Calcium: 9.3 mg/dL (ref 8.9–10.3)
Chloride: 107 mmol/L (ref 101–111)
Creatinine, Ser: 1.57 mg/dL — ABNORMAL HIGH (ref 0.61–1.24)
GFR calc Af Amer: 49 mL/min — ABNORMAL LOW (ref >60)
GFR calc non Af Amer: 42 mL/min — ABNORMAL LOW (ref >60)
Glucose, Bld: 111 mg/dL — ABNORMAL HIGH (ref 65–99)
Potassium: 3.7 mmol/L (ref 3.5–5.1)
Sodium: 141 mmol/L (ref 135–145)

## 2015-10-03 LAB — GLUCOSE, CAPILLARY
Glucose-Capillary: 104 mg/dL — ABNORMAL HIGH (ref 65–99)
Glucose-Capillary: 50 mg/dL — ABNORMAL LOW (ref 65–99)
Glucose-Capillary: 76 mg/dL (ref 65–99)
Glucose-Capillary: 152 mg/dL — ABNORMAL HIGH (ref 65–99)
Glucose-Capillary: 102 mg/dL — ABNORMAL HIGH (ref 65–99)

## 2015-10-03 MED ORDER — INSULIN ASPART 100 UNIT/ML ~~LOC~~ SOLN
5.0000 [IU] | Freq: Three times a day (TID) | SUBCUTANEOUS | Status: DC
  Administered 2015-10-03 – 2015-10-09 (×12): 5 [IU] via SUBCUTANEOUS

## 2015-10-03 NOTE — Progress Notes (Signed)
Physical Therapy Session Note  Patient Details  Name: Tanner Mason MRN: [id number] Date of Birth: 03-01-1941  Today's Date: 10/03/2015 PT Individual Time: 1003-1104 PT Individual Time Calculation (min): 61 min   Short Term Goals: Week 1:  PT Short Term Goal 1 (Week 1): Pt will complete squat pivot transfers with steady A. PT Short Term Goal 2 (Week 1): Pt will ambulate 25 ft with LRAD & Min A.  PT Short Term Goal 3 (Week 1): Pt will self propel w/c x 200 ft with BUE for cardiovascular endurance training & supervision.   Skilled Therapeutic Interventions/Progress Updates:    Pt received resting in w/c with no c/o pain and agreeable to therapy session.  Session focus on community mobility with RW and w/c, gait training with RW and rollator, and stair negotiation with RW.    Pt propelled w/c >1000' outside, over a variety of surfaces, through thresholds, and up/down incline with mod I.   Gait training outside on brick/concrete with RW and initial steady assist, fade to supervision.  Pt demonstrates consistent gait speed and L=R step length.  PT discussed d/c planning with pt and wife regarding use of RW versus rollator.  Pt asks appropriate questions about pros/cons of rollator and demonstrates good anticipatory awareness, stating "it would be nice if I could have the one with the seat because now when I'm walking, I'm constantly scanning ahead for a place I could sit if I needed to."  Pt returned to unit in w/c total assist for time management. PT instructed pt in negotiation of 2 steps with RW, forward ascent, 2 trials.  Pt requiring light steady assist on both trials for pt comfort.  Discussed home entry and pt and wife state that there are 2x2 steps and that they are not full height steps.    Gait training [phone] with rollator with brakes locked for more stability, supervision from PT.  Pt seated in recliner at end of session and left with call bell in reach and needs met.   Therapy  Documentation Precautions:  Precautions Precautions: Fall Precaution Comments: BLE knee buckling Restrictions Weight Bearing Restrictions: No   See Function Navigator for Current Functional Status.   Therapy/Group: Individual Therapy  Jian Hodgman E [place] 10/03/2015, 12:00 PM

## 2015-10-03 NOTE — Progress Notes (Signed)
Occupational Therapy Session Note  Patient Details  Name: Tanner RueCharles A Griffey MRN: [id number] Date of Birth: 08-Apr-1941  Today's Date: 10/03/2015 OT Individual Time: [id number] and 1503-1600 OT Individual Time Calculation (min): 72 min and 57 min    Short Term Goals: Week 1:  OT Short Term Goal 1 (Week 1): Pt will complete squat pivot to toilet with min assist OT Short Term Goal 2 (Week 1): Pt will complete LB dressing with mod assist at sit > stand level OT Short Term Goal 3 (Week 1): Pt will complete bathing at sit > stand level with min assist OT Short Term Goal 4 (Week 1): Pt will complete 1 grooming activity in standing with min assist for standing balance.  Skilled Therapeutic Interventions/Progress Updates:    Session 1: Upon entering the room, pt seated in recliner chair awaiting therapist. Pt with no c/o pain this session. Pt ambulated with RW and steady assist to obtain clothing items from dresser. Pt then ambulating into bathroom and steady assist transfer into shower and onto shower seat. Pt performed bathing from shower chair with forward lean to wash buttocks while holding onto grab bar with supervision. Pt dressing LB from shower seat in order to conserve energy. Pt performed grooming tasks while standing at sink side with close supervision. Pt seated in recliner chair at end of session with call bell and all needed items within reach upon exiting the room.   Session 2: Upon entering the room, pt seated in recliner chair with no c/o pain this session. Pt ambulated from room to ADL apartment 100' with steady assist and use of rollator. Pt taking seated rest break secondary to fatigue. Pt practicing several functional transfers onto low, plush couch and also standard bed with steady assist and verbal cues for hand placement. Pt engaged in bed making tasks with use of rollator with min A and min cues for safety. OT providing education as well for kitchen mobility with use of rollator. Pt  returned demonstrations with use of rollator to obtain items from refrigerator and place onto cabinet and discussion of set up of kitchen for safety. Pt verbalized understanding and ambulated with rollator back to room. Pt seated in recliner chair with call bell and all needed items within reach. Pt's wife entered the room with home measurements.   Therapy Documentation Precautions:  Precautions Precautions: Fall Precaution Comments: BLE knee buckling Restrictions Weight Bearing Restrictions: No Vital Signs: Therapy Vitals Temp: 98.1 F (36.7 C) Temp Source: Oral Pulse Rate: 87 Resp: 18 BP: 128/69 mmHg Patient Position (if appropriate): Lying Oxygen Therapy SpO2: 98 % O2 Device: Not Delivered  See Function Navigator for Current Functional Status.   Therapy/Group: Individual Therapy  Lowella Gripittman, Mirjana Tarleton L 10/03/2015, 8:13 AM

## 2015-10-03 NOTE — Progress Notes (Signed)
Hypoglycemic Event  CBG: 50  Treatment: 15 GM carbohydrate snack  Symptoms: Pale  Follow-up CBG: Time:1210 CBG Result:76  Possible Reasons for Event: Unknown  Comments/MD notified:P. Love, PA    Mamie NickKnisley, Andriel Omalley E

## 2015-10-03 NOTE — Progress Notes (Signed)
Patient is eating well but with increase in activity is having hypoglycemic episodes. Will decrease meal coverage to 5 units tid ac and monitor

## 2015-10-03 NOTE — Consult Note (Signed)
  INITIAL DIAGNOSTIC EVALUATION - [facility]   MEDICAL NECESSITY:  [name] was seen on the [facility] for an initial diagnostic evaluation owing to the patient's diagnosis of Guillan Barre.   Records indicate that [name] is a "75 y.o. male with history of DMT2 with neuropathy and gait disorder who was admitted on 09/17/15 with numbness of feet with worsening balance and progressive weakness. Had been seen in the ED few days before with negative work up but continued to worsen and was found to be a-reflexic with work up consistent with GBS. MRI lumbar spine with mild to moderate stenosis L4/5 and L5/S1 and LP with elevated glucose, elevated protein and elevated IgG. Cultures negative. He was started on PLEX with improvement in strength with ability to stand--last PLEX yesterday.  He had developed angioedema (history of excessive histamines) that improved with prednisone and Benadryl."  During today's visit, [name] denied suffering from any cognitive deficits for any reason. He was noting mild changing in coordination and strength prior to his diagnosis of GBS. From an emotional standpoint, he described his current mood as "okay" but he acknowledged experiencing normal reaction issues related to feeling awkward about being in the hospital and all the treatment that his diagnosis entails. However, he denied experiencing any significant symptoms of depression or anxiety. He also has no history of being treated for mental illness or ever suffering from any prolonged periods of depression or anxiety throughout his life. No adjustment issues endorsed. Suicidal/homicidal ideation, plan or intent was denied. No manic or hypomanic episodes were reported. The patient denied ever experiencing any auditory/visual hallucinations. No major behavioral or personality changes were endorsed.   [name] feels that he is making  progress in therapy. No barriers to therapy identified. He described the rehab staff as "excellent" and he has his wife to support him throughout this admission. She will also be able to assist him with his transition home.   PROCEDURES: [1 unit 90791] Diagnostic clinical interview  Review of available records  IMPRESSION: Overall, [name] denied suffering any cognitive deficits and no overt deficits were observed. Emotionally, he is having what I would call a normal reaction to an unfortunate situation with no major adjustment issues. We spent time discussing common coping strategies, particularly ones for improving sleep as this has been a mild issue. I do not feel that further follow-up from Neuropsychology is warranted, though he was encouraged to call upon us if he notes any changes in mood or cognition.     Debbe MountsAdam T. Zo Loudon, Psy.D., ABN Board-certified Clinical Neuropsychologist  Rehabilitation Psychologist

## 2015-10-03 NOTE — Progress Notes (Signed)
Occupational Therapy Note  Patient Details  Name: Tanner Mason MRN: [id number] Date of Birth: [birth year]  Today's Date: 10/03/2015 OT Individual Time: 1100-1130 OT Individual Time Calculation (min): 30 min   Pt denied pain Individual Therapy  Pt in bathroom upon arrival.  Pt completed toileting and amb with Rollator to sink to wash hands.  Pt amb to ADL apt and practiced simulated walk-in shower transfers.  Practiced stepping back into shower stall and side stepping into shower stall.  Pt agrees with recommendation to side step into shower stall and using grab bar on pt's left.  Pt amb back to room and remained in recliner with wife present.   Lavone NeriLanier, Modest Draeger [facility] 10/03/2015, 11:58 AM

## 2015-10-03 NOTE — Progress Notes (Signed)
[employer] PHYSICAL MEDICINE & REHABILITATION     PROGRESS NOTE  Subjective/Complaints:  Pt sitting up in his chair working with therapies.  He notes his new room is smaller than his previous one.  He also would like to know if he can go to his grandson's graduation on Friday.    ROS:  Denies CP, SOB, nausea, vomiting, diarrhea.  Objective: Vital Signs: Blood pressure 128/69, pulse 87, temperature 98.1 F (36.7 C), temperature source Oral, resp. rate 18, height 5\' 7"  (1.702 m), weight 70.1 kg (154 lb 8.7 oz), SpO2 98 %. No results found.  Recent Labs  10/03/15 0456  WBC 4.2  HGB 9.5*  HCT 28.7*  PLT 142*    Recent Labs  10/03/15 0456  NA 141  K 3.7  CL 107  GLUCOSE 111*  BUN 18  CREATININE 1.57*  CALCIUM 9.3   CBG (last 3)   Recent Labs  10/02/15 1720 10/02/15 2131 10/03/15 0632  GLUCAP 82 159* 104*    Wt Readings from Last 3 Encounters:  09/27/15 70.1 kg (154 lb 8.7 oz)  09/23/15 70.7 kg (155 lb 13.8 oz)  07/14/15 76.204 kg (168 lb)    Physical Exam:  BP 128/69 mmHg  Pulse 87  Temp(Src) 98.1 F (36.7 C) (Oral)  Resp 18  Ht 5\' 7"  (1.702 m)  Wt 70.1 kg (154 lb 8.7 oz)  BMI 24.20 kg/m2  SpO2 98% Constitutional: He appears well-developed and well-nourished. No distress.  HENT: Normocephalic and atraumatic.   Eyes: Conjunctivae and EOM are normal. No scleral icterus.  Cardiovascular: Normal rate and regular rhythm.  Respiratory: Effort normal and breath sounds normal. No respiratory distress. He exhibits no tenderness.  GI: Soft. Bowel sounds are normal. He exhibits no distension. There is no tenderness.  Musculoskeletal: He exhibits no edema or tenderness.  Neurological: He is alert and oriented.  Speech clear.  Mild ptosis on left--chronic.  Motor: 4+/5 bilateral deltoids, biceps, triceps, grip 4/5 hip flexors 4/5 knee extensor, 4/5 ankle dorsiflexor  Sensation decreased to LT below knees bilaterally Skin: Skin is warm and dry. He is not  diaphoretic.  Psychiatric: He has a normal mood and affect. His behavior is normal. Judgment and thought content normal   Assessment/Plan: 1. Functional deficits secondary to Guillain Barre Syndrome which require 3+ hours per day of interdisciplinary therapy in a comprehensive inpatient rehab setting. Physiatrist is providing close team supervision and 24 hour management of active medical problems listed below. Physiatrist and rehab team continue to assess barriers to discharge/monitor patient progress toward functional and medical goals.  Function:  Bathing Bathing position   Position: Shower  Bathing parts Body parts bathed by patient: Right arm, Left arm, Chest, Abdomen, Front perineal area, Right upper leg, Left upper leg, Right lower leg, Left lower leg, Buttocks Body parts bathed by helper: Back  Bathing assist Assist Level: Supervision or verbal cues      Upper Body Dressing/Undressing Upper body dressing   What is the patient wearing?: Pull over shirt/dress     Pull over shirt/dress - Perfomed by patient: Thread/unthread right sleeve, Thread/unthread left sleeve, Put head through opening, Pull shirt over trunk          Upper body assist Assist Level: Supervision or verbal cues   Set up : To obtain clothing/put away  Lower Body Dressing/Undressing Lower body dressing   What is the patient wearing?: Underwear, Pants, Socks, Shoes Underwear - Performed by patient: Thread/unthread right underwear leg, Thread/unthread left underwear leg,  Pull underwear up/down Underwear - Performed by helper: Pull underwear up/down, Thread/unthread right underwear leg Pants- Performed by patient: Thread/unthread right pants leg, Thread/unthread left pants leg, Pull pants up/down Pants- Performed by helper: Thread/unthread left pants leg, Pull pants up/down Non-skid slipper socks- Performed by patient: Don/doff right sock, Don/doff left sock Non-skid slipper socks- Performed by helper:  Don/doff right sock Socks - Performed by patient: Don/doff right sock, Don/doff left sock Socks - Performed by helper: Don/doff left sock Shoes - Performed by patient: Don/doff right shoe, Don/doff left shoe, Fasten right, Fasten left Shoes - Performed by helper: Fasten right, Fasten left          Lower body assist Assist for lower body dressing: Touching or steadying assistance (Pt > 75%)      Toileting Toileting   Toileting steps completed by patient: Adjust clothing prior to toileting, Performs perineal hygiene, Adjust clothing after toileting Toileting steps completed by helper: Adjust clothing prior to toileting, Adjust clothing after toileting Toileting Assistive Devices: Grab bar or rail  Toileting assist Assist level: Touching or steadying assistance (Pt.75%)   Transfers Chair/bed transfer   Chair/bed transfer method: Stand pivot, Ambulatory, Squat pivot (supervision squat/pivot, steady assist for stand/ambulatory) Chair/bed transfer assist level: Touching or steadying assistance (Pt > 75%) Chair/bed transfer assistive device: Armrests, Patent attorney     Max distance: 115 Assist level: Supervision or verbal cues   Wheelchair   Type: Manual Max wheelchair distance: 150 Assist Level: Supervision or verbal cues  Cognition Comprehension Comprehension assist level: Understands complex 90% of the time/cues 10% of the time  Expression Expression assist level: Expresses complex 90% of the time/cues < 10% of the time  Social Interaction Social Interaction assist level: Interacts appropriately 90% of the time - Needs monitoring or encouragement for participation or interaction.  Problem Solving Problem solving assist level: Solves complex 90% of the time/cues < 10% of the time  Memory Memory assist level: Recognizes or recalls 90% of the time/requires cueing < 10% of the time    Medical Problem List and Plan: 1. Functional and mobility deficits secondary  to Guillain Barre Syndrome  Cont CIR 2. DVT Prophylaxis/Anticoagulation: Pharmaceutical: Lovenox 3. Pain Management: on Neurontin effective as symptoms improving.  4. Mood: LCSW to follow for evaluation and support.  5. Neuropsych: This patient is capable of making decisions on his own behalf. 6. Skin/Wound Care: Routine pressure relief measures.  7. Fluids/Electrolytes/Nutrition: Encourage po fluid intake.  8. T2DM with peripheral neuropathy: Uncontrolled --Hgb A1c-7.4.   On lantus 50 units  Meal coverage tid decreased on 6/5  Will cont to adjust depending on trend  Continues to be relatively controlled 9. Histamine excess:   Monitor for recurrence of angioedema.   Continue to use benadryl/prednisone prn 10. Normocytic anemia:   Added iron supplement  Hb 9.5 on 6/6, previously ~10.4   Will order repeat labs for tomorrow 11. HTN: Monitor BP and adjust medications as indicated.   Continue metoprolol bid, decreased to 75 on 6/1  Norvasc decreased to 5mg .  Within normal limits at present 12. Acute on chronic renal failure:   Encourage po fluids.  Cr. 1.57 on 6/6, slowly improving 13. Diarrhea/Constipation:   KUB 5/31 reviewed, relatively unremarkable.  Bowel meds increased on 6/2, again on 6/3, reviewed again on 6/4  Appears to have resolved 14. Thrombocytopenia  Plts 142 on 6/6 (improving)  LOS (Days) 7 A FACE TO FACE EVALUATION WAS PERFORMED  Anissa Abbs Karis Juba 10/03/2015  7:58 AM

## 2015-10-04 ENCOUNTER — Inpatient Hospital Stay (HOSPITAL_COMMUNITY): Payer: Medicare Other | Admitting: Physical Therapy

## 2015-10-04 ENCOUNTER — Inpatient Hospital Stay (HOSPITAL_COMMUNITY): Payer: Medicare Other | Admitting: *Deleted

## 2015-10-04 ENCOUNTER — Inpatient Hospital Stay (HOSPITAL_COMMUNITY): Payer: Medicare Other | Admitting: Occupational Therapy

## 2015-10-04 LAB — CBC WITH DIFFERENTIAL/PLATELET
Basophils Absolute: 0 10*3/uL (ref 0.0–0.1)
Basophils Relative: 0 %
Eosinophils Absolute: 0.1 10*3/uL (ref 0.0–0.7)
Eosinophils Relative: 2 %
HCT: 28.2 % — ABNORMAL LOW (ref 39.0–52.0)
Hemoglobin: 9.3 g/dL — ABNORMAL LOW (ref 13.0–17.0)
Lymphocytes Relative: 33 %
Lymphs Abs: 1.3 10*3/uL (ref 0.7–4.0)
MCH: 29.3 pg (ref 26.0–34.0)
MCHC: 33 g/dL (ref 30.0–36.0)
MCV: 89 fL (ref 78.0–100.0)
Monocytes Absolute: 0.4 10*3/uL (ref 0.1–1.0)
Monocytes Relative: 10 %
Neutro Abs: 2.1 10*3/uL (ref 1.7–7.7)
Neutrophils Relative %: 55 %
Platelets: 122 10*3/uL — ABNORMAL LOW (ref 150–400)
RBC: 3.17 MIL/uL — ABNORMAL LOW (ref 4.22–5.81)
RDW: 14 % (ref 11.5–15.5)
WBC: 3.9 10*3/uL — ABNORMAL LOW (ref 4.0–10.5)

## 2015-10-04 LAB — GLUCOSE, CAPILLARY
Glucose-Capillary: 100 mg/dL — ABNORMAL HIGH (ref 65–99)
Glucose-Capillary: 96 mg/dL (ref 65–99)
Glucose-Capillary: 72 mg/dL (ref 65–99)
Glucose-Capillary: 116 mg/dL — ABNORMAL HIGH (ref 65–99)

## 2015-10-04 NOTE — Progress Notes (Signed)
Physical Therapy Weekly Progress Note  Patient Details  Name: Tanner Mason MRN: [id number] Date of Birth: January 23, 1941  Beginning of progress report period: Sep 27, 2015 End of progress report period: October 04, 2015  Today's Date: 10/04/2015 PT Individual Time: 1350-1430 PT Individual Time Calculation (min): 40 min   Patient has met 3 of 3 short term goals.  Pt had made excellent progress with therapy this week and demonstrates gains in functional mobility, strength, and endurance.    Patient continues to demonstrate the following deficits: activity tolerance and strength and therefore will continue to benefit from skilled PT intervention to enhance overall performance with activity tolerance, balance, postural control and awareness.  Patient progressing toward long term goals..  Plan of care revisions: upgraded transfer goals and household mobility goals to mod I with rollator, community amb and w/c goals remain at supervision, stairs remain at steady assist 2/2 no rails.  PT Short Term Goals Week 1:  PT Short Term Goal 1 (Week 1): Pt will complete squat pivot transfers with steady A. PT Short Term Goal 1 - Progress (Week 1): Met PT Short Term Goal 2 (Week 1): Pt will ambulate 25 ft with LRAD & Min A.  PT Short Term Goal 2 - Progress (Week 1): Met PT Short Term Goal 3 (Week 1): Pt will self propel w/c x 200 ft with BUE for cardiovascular endurance training & supervision.  PT Short Term Goal 3 - Progress (Week 1): Met Week 2:  PT Short Term Goal 1 (Week 2): =LTGs due to ELOS  Skilled Therapeutic Interventions/Progress Updates:    Pt received resting in recliner, no c/o pain, agreeable to therapy session.  Session focus on gait training, transfers, stair negotiation with rollator, and d/c planning.    PT discussed with pt and pt's wife, Tanner Mason, the outing pt went on earlier today and reviewed problems and solutions that pt encountered.  Pt able to identify 2 problem areas of opening a  door with rollator and reaching items on lower shelves and able to describe method of safely working through problems with min question cues.    Gait training to/from therapy gym with rollator and supervision.  PT instructed pt in negotiation of 1 5" step and 1 9" step 3 trials each height step with rollator.  Pt provided light steady assist/supervision and mod verbal cues for sequencing on initial trial with rollator.  PT instructed Tanner Mason in providing light steady assist and she returned demonstration on both height steps.    Pt returned to room at end of session and positioned in recliner with call bell in reach and needs met.   Therapy Documentation Precautions:  Precautions Precautions: Fall Precaution Comments: BLE knee buckling Restrictions Weight Bearing Restrictions: No   See Function Navigator for Current Functional Status.  Therapy/Group: Individual Therapy  Khair Chasteen E Penven-Crew 10/04/2015, 3:02 PM

## 2015-10-04 NOTE — Progress Notes (Signed)
Recreational Therapy Session Note  Patient Details  Name: Tanner Mason MRN: [id number] Date of Birth: Feb 06, 1941 Today's Date: 10/04/2015  Pain: no c/o Skilled Therapeutic Interventions/Progress Updates: Pt participated in community reintegration/outing to [employer] at overall steady assist ambulatory level using rollator.  Goals & education focused on safe functional mobility on various community surfaces, identification & negotiation of obstacles, retrieving and transporting items from various shelf heights, accessing public restroom and discharge planning in regards to community & leisure pursuits.  See outing goal sheet in shadow chart for full details.  Therapy/Group: [employer] [employer] [employer]   Jeremih Dearmas 10/04/2015, 12:18 PM

## 2015-10-04 NOTE — Progress Notes (Signed)
Recreational Therapy Discharge Summary Patient Details  Name: Tanner Mason MRN: [id number] Date of Birth: May 18, 1940 Today's Date: 10/04/2015  Long term goals set: 1  Long term goals met: 1  Comments on progress toward goals: Pt has made excellent progress toward goal and is ready for discharge home with wife when team feels is appropriate.  Education provided on activity analysis with potential adaptations, energy conservation & community pursuits. Reasons for discharge: discharge from hospital  Patient/family agrees with progress made and goals achieved: Yes  Chyanne Kohut 10/04/2015, 12:22 PM

## 2015-10-04 NOTE — Progress Notes (Signed)
Occupational Therapy Session Note  Patient Details  Name: Tanner Mason MRN: [id number] Date of Birth: February 08, 1941  Today's Date: 10/04/2015 OT Individual Time: [id number] OT Individual Time Calculation (min): 58 min    Short Term Goals: Week 1:  OT Short Term Goal 1 (Week 1): Pt will complete squat pivot to toilet with min assist OT Short Term Goal 2 (Week 1): Pt will complete LB dressing with mod assist at sit > stand level OT Short Term Goal 3 (Week 1): Pt will complete bathing at sit > stand level with min assist OT Short Term Goal 4 (Week 1): Pt will complete 1 grooming activity in standing with min assist for standing balance.  Skilled Therapeutic Interventions/Progress Updates:  Upon entering the room, pt seated in wheelchair awaiting therapist. Pt obtained clothing items with steady assist with use of rollator. Pt ambulating into bathroom with rollator and performing transfer onto shower chair with steady assist and use of grab bar. Pt bathing from seated position with intermittent supervision. Pt returning to dress from recliner chair with steady assist for LB dressing. Pt needing multiple rest breaks secondary to fatigue with self care tasks. Pt standing at sink with supervision for grooming tasks for 6 minutes. Pt returned to recliner chair at end of session with call bell and all needed items within reach upon exiting the room.    Therapy Documentation Precautions:  Precautions Precautions: Fall Precaution Comments: BLE knee buckling Restrictions Weight Bearing Restrictions: No  See Function Navigator for Current Functional Status.   Therapy/Group: Individual Therapy    Lowella Gripittman, [name] L 10/04/2015, 7:43 PM

## 2015-10-04 NOTE — Plan of Care (Signed)
Problem: RH Leisure Awareness Goal: LTG: Patient will participate in community (TR) LTG: Patient will participate in community reintegration activities to increase ability to identify and adapt to barriers, perform living skills, ambulate/propel wheelchair at specific level (TR)  Goal upgraded due to pt progress

## 2015-10-04 NOTE — Progress Notes (Signed)
Occupational Therapy Session Note  Patient Details  Name: Tanner RueCharles A Mosely MRN: [id number] Date of Birth: 21-Feb-1941  Today's Date: 10/04/2015 OT Concurrent Time: 1000-1200 OT Concurrent Time Calculation (min): 120 min   Short Term Goals: Week 1:  OT Short Term Goal 1 (Week 1): Pt will complete squat pivot to toilet with min assist OT Short Term Goal 2 (Week 1): Pt will complete LB dressing with mod assist at sit > stand level OT Short Term Goal 3 (Week 1): Pt will complete bathing at sit > stand level with min assist OT Short Term Goal 4 (Week 1): Pt will complete 1 grooming activity in standing with min assist for standing balance.  Skilled Therapeutic Interventions/Progress Updates:    Pt seen for community outing focusing on functional activity tolerance, community accessibility, and education. Pt taken to [employer] and ambulated throughout store using rollator with CGA- close supervision. Pt able to verbalize need for seated rest breaks throughout outing, utilizing rollator seat when in middle of store.  He was able to reach overhead and bend down to access items on store shelf with min A. Pt with several episodes of L knee buckling, min A required to regain balance.  He accessed public restroom with supervision for problem solving rollator management and VCs for hand placement during sit <> stand. Practiced using stall type bathroom, urinal, and washing hands at sink. Educated throughout session regarding energy conservation techniques in community and home setting. Also discussed and problem solved transporting items in store while utilizing rollator.  Pt returned to room at end of session, very grateful for community outing opportunity and voiced feeling much more confident with abilities following outing. Pt left with wife present and all needs in reach.   Therapy Documentation Precautions:  Precautions Precautions: Fall Precaution Comments: BLE knee buckling Restrictions Weight  Bearing Restrictions: No Pain: No/ denies pain  See Function Navigator for Current Functional Status.   Therapy/Group: Individual Therapy  Lewis, Odysseus Cada C 10/04/2015, 7:19 AM

## 2015-10-04 NOTE — Progress Notes (Signed)
PHYSICAL MEDICINE & REHABILITATION     PROGRESS NOTE  Subjective/Complaints:  Pt without new issues overnite No pains, tolerating therapy some constiption noted  ROS:  Denies CP, SOB, nausea, vomiting, diarrhea.  Objective: Vital Signs: Blood pressure 141/73, pulse 92, temperature 98 F (36.7 C), temperature source Oral, resp. rate 18, height  (1.702 m), weight 70.1 kg (154 lb 8.7 oz), SpO2 99 %. No results found.  Recent Labs  10/03/15 0456 10/04/15 0507  WBC 4.2 3.9*  HGB 9.5* 9.3*  HCT 28.7* 28.2*  PLT 142* 122*    Recent Labs  10/03/15 0456  NA 141  K 3.7  CL 107  GLUCOSE 111*  BUN 18  CREATININE 1.57*  CALCIUM 9.3   CBG (last 3)   Recent Labs  10/03/15 1641 10/03/15 2038 10/04/15 0652  GLUCAP 152* 102* 100*    Wt Readings from Last 3 Encounters:  09/27/15 70.1 kg (154 lb 8.7 oz)  09/23/15 70.7 kg (155 lb 13.8 oz)  07/14/15 76.204 kg (168 lb)    Physical Exam:  BP 141/73 mmHg  Pulse 92  Temp(Src) 98 F (36.7 C) (Oral)  Resp 18  Ht  (1.702 m)  Wt 70.1 kg (154 lb 8.7 oz)  BMI 24.20 kg/m2  SpO2 99% Constitutional: He appears well-developed and well-nourished. No distress.  HENT: Normocephalic and atraumatic.   Eyes: Conjunctivae and EOM are normal. No scleral icterus.  Cardiovascular: Normal rate and regular rhythm.  Respiratory: Effort normal and breath sounds normal. No respiratory distress. He exhibits no tenderness.  GI: Soft. Bowel sounds are normal. He exhibits no distension. There is no tenderness.  Musculoskeletal: He exhibits no edema or tenderness.  Neurological: He is alert and oriented.  Speech clear.  Mild ptosis on left--chronic.  Motor: 4+/5 bilateral deltoids, biceps, triceps, grip 4/5 hip flexors 4/5 knee extensor, 4/5 ankle dorsiflexor  Sensation decreased to LT below knees bilaterally Skin: Skin is warm and dry. He is not diaphoretic.  Psychiatric: He has a normal mood and affect. His behavior is  normal. Judgment and thought content normal   Assessment/Plan: 1. Functional deficits secondary to Guillain Barre Syndrome which require 3+ hours per day of interdisciplinary therapy in a comprehensive inpatient rehab setting. Physiatrist is providing close team supervision and 24 hour management of active medical problems listed below. Physiatrist and rehab team continue to assess barriers to discharge/monitor patient progress toward functional and medical goals.  Function:  Bathing Bathing position   Position: Shower  Bathing parts Body parts bathed by patient: Right arm, Left arm, Chest, Abdomen, Front perineal area, Right upper leg, Left upper leg, Right lower leg, Left lower leg, Buttocks Body parts bathed by helper: Back  Bathing assist Assist Level: Supervision or verbal cues      Upper Body Dressing/Undressing Upper body dressing   What is the patient wearing?: Pull over shirt/dress     Pull over shirt/dress - Perfomed by patient: Thread/unthread right sleeve, Thread/unthread left sleeve, Put head through opening, Pull shirt over trunk          Upper body assist Assist Level: Supervision or verbal cues   Set up : To obtain clothing/put away  Lower Body Dressing/Undressing Lower body dressing   What is the patient wearing?: Underwear, Pants, Socks, Shoes Underwear - Performed by patient: Thread/unthread right underwear leg, Thread/unthread left underwear leg, Pull underwear up/down Underwear - Performed by helper: Pull underwear up/down, Thread/unthread right underwear leg Pants- Performed by patient: Thread/unthread right pants  leg, Thread/unthread left pants leg, Pull pants up/down Pants- Performed by helper: Thread/unthread left pants leg, Pull pants up/down Non-skid slipper socks- Performed by patient: Don/doff right sock, Don/doff left sock Non-skid slipper socks- Performed by helper: Don/doff right sock Socks - Performed by patient: Don/doff right sock, Don/doff  left sock Socks - Performed by helper: Don/doff left sock Shoes - Performed by patient: Don/doff right shoe, Don/doff left shoe, Fasten right, Fasten left Shoes - Performed by helper: Fasten right, Fasten left          Lower body assist Assist for lower body dressing: Touching or steadying assistance (Pt > 75%)      Toileting Toileting   Toileting steps completed by patient: Adjust clothing prior to toileting, Performs perineal hygiene, Adjust clothing after toileting Toileting steps completed by helper: Adjust clothing prior to toileting, Adjust clothing after toileting Toileting Assistive Devices: Grab bar or rail  Toileting assist Assist level: Touching or steadying assistance (Pt.75%)   Transfers Chair/bed transfer   Chair/bed transfer method: Stand pivot, Ambulatory, Squat pivot Chair/bed transfer assist level: Supervision or verbal cues Chair/bed transfer assistive device: Armrests, Patent attorney     Max distance: 175 Assist level: Supervision or verbal cues   Wheelchair   Type: Manual Max wheelchair distance: >1000 Assist Level: No help, No cues, assistive device, takes more than reasonable amount of time  Cognition Comprehension Comprehension assist level: Understands complex 90% of the time/cues 10% of the time  Expression Expression assist level: Expresses complex 90% of the time/cues < 10% of the time  Social Interaction Social Interaction assist level: Interacts appropriately 90% of the time - Needs monitoring or encouragement for participation or interaction.  Problem Solving Problem solving assist level: Solves complex 90% of the time/cues < 10% of the time  Memory Memory assist level: Recognizes or recalls 90% of the time/requires cueing < 10% of the time    Medical Problem List and Plan: 1. Functional and mobility deficits secondary to Guillain Barre Syndrome  Cont CIR 2. DVT Prophylaxis/Anticoagulation: Pharmaceutical: Lovenox 3.  Pain Management: on Neurontin effective as symptoms improving.  4. Mood: LCSW to follow for evaluation and support.  5. Neuropsych: This patient is capable of making decisions on his own behalf. 6. Skin/Wound Care: Routine pressure relief measures.  7. Fluids/Electrolytes/Nutrition: Encourage po fluid intake.  8. T2DM with peripheral neuropathy: Uncontrolled --Hgb A1c-7.4.   On lantus 50 units  Meal coverage tid decreased on 6/5  Will cont to adjust depending on trend  Continues to be relatively controlled 9. Histamine excess:   Monitor for recurrence of angioedema.   Continue to use benadryl/prednisone prn 10. Normocytic anemia:   Added iron supplement  Hb 9.5 on 6/6, previously ~10.4 , stable 6/7 at 9.3   11. HTN: Monitor BP and adjust medications as indicated.   Continue metoprolol bid, decreased to 75 on 6/1  Norvasc decreased to 5mg .   Filed Vitals:   10/03/15 1453 10/04/15 0601  BP: 125/62 141/73  Pulse: 98 92  Temp: 98.4 F (36.9 C) 98 F (36.7 C)  Resp: 16 18   12. Acute on chronic renal failure:   Encourage po fluids.  Cr. 1.57 on 6/6, slowly improving BMP Latest Ref Rng 10/03/2015 09/29/2015 09/27/2015  Glucose 65 - 99 mg/dL 161(W) 960(A) 540(J)  BUN 6 - 20 mg/dL 18 81(X) 91(Y)  Creatinine 0.61 - 1.24 mg/dL 7.82(N) 5.62(Z) 3.08(M)  Sodium 135 - 145 mmol/L 141 138 138  Potassium 3.5 -  5.1 mmol/L 3.7 3.7 3.7  Chloride 101 - 111 mmol/L 107 108 110  CO2 22 - 32 mmol/L 24 24 19(L)  Calcium 8.9 - 10.3 mg/dL 9.3 9.0 9.0    13. Diarrhea/Constipation:   KUB 5/31 reviewed, relatively unremarkable.  Bowel meds increased on 6/2, again on 6/3, reviewed again on 6/4  Appears to have resolved 14. Thrombocytopenia  Plts 122 on 6/7 stable  LOS (Days) 8 A FACE TO FACE EVALUATION WAS PERFORMED  Erick ColaceKIRSTEINS,ANDREW E 10/04/2015 7:36 AM

## 2015-10-05 ENCOUNTER — Inpatient Hospital Stay (HOSPITAL_COMMUNITY): Payer: Medicare Other | Admitting: Physical Therapy

## 2015-10-05 ENCOUNTER — Inpatient Hospital Stay (HOSPITAL_COMMUNITY): Payer: Medicare Other | Admitting: Occupational Therapy

## 2015-10-05 LAB — GLUCOSE, CAPILLARY
Glucose-Capillary: 82 mg/dL (ref 65–99)
Glucose-Capillary: 94 mg/dL (ref 65–99)
Glucose-Capillary: 101 mg/dL — ABNORMAL HIGH (ref 65–99)
Glucose-Capillary: 105 mg/dL — ABNORMAL HIGH (ref 65–99)

## 2015-10-05 MED ORDER — POLYETHYLENE GLYCOL 3350 17 G PO PACK: 17.0000 g | PACK | Freq: Every day | ORAL | Status: DC | PRN

## 2015-10-05 NOTE — Progress Notes (Signed)
Occupational Therapy Weekly Progress Note  Patient Details  Name: Tanner Mason MRN: [id number] Date of Birth: 1940-06-20  Beginning of progress report period: Sep 27, 2015 End of progress report period: October 05, 2015  Today's Date: 10/05/2015 OT Individual Time: 5638-7564 and 1500-1600 OT Individual Time Calculation (min): 60 min and 60 min    Patient has met 4 of 4 short term goals. Pt is making significant progress towards OT goals this week. Pt is currently ambulating with rollator with supervision -steady assistance. Pt continues to fatigue easily during sessions requiring frequent rest breaks. Caregiver has been trained to assist with functional transfers within the room safety.   Patient continues to demonstrate the following deficits:  and therefore will continue to benefit from skilled OT intervention to enhance overall performance with BADL.  Patient progressing toward long term goals..  Continue plan of care.  OT Short Term Goals Week 1:  OT Short Term Goal 1 (Week 1): Pt will complete squat pivot to toilet with min assist OT Short Term Goal 1 - Progress (Week 1): Met OT Short Term Goal 2 (Week 1): Pt will complete LB dressing with mod assist at sit > stand level OT Short Term Goal 2 - Progress (Week 1): Met OT Short Term Goal 3 (Week 1): Pt will complete bathing at sit > stand level with min assist OT Short Term Goal 3 - Progress (Week 1): Met OT Short Term Goal 4 (Week 1): Pt will complete 1 grooming activity in standing with min assist for standing balance. OT Short Term Goal 4 - Progress (Week 1): Met Week 2:  OT Short Term Goal 1 (Week 2): STGs=LTGs secondary to upcoming discharge  Skilled Therapeutic Interventions/Progress Updates:    Session 1:  Upon entering the room, pt seated in recliner chair and requesting to toilet. Pt ambulating with rollator into bathroom for toileting and transfer with supervision this session. Pt declined bathing and dressing this session.  Pt ambulated to gym for B UE exercises with 30 lbs of resistance for bicep curls and pull downs while seated in wheelchair. Pt performing 3 sets of 10 of each exercise. Pt ambulated to ADL apartment with wife present for transfers on and off of soft, plush couch with supervision and use of arm rest. Pt also demonstrating ability to fully turn around in in tight space with rollator with close supervision. Pt returning to room at end of session with call bell and all needed items within reach upon exiting the room.   Session 2: Upon entering the room, pt seated in recliner chair with no c/o pain this session. His wife is present for continued family education. Equipment recently delivered to room and adjusted for pt use this session. Pt ambulating to therapy gym with close supervision and use of rollator. Pt obtaining items for horseshoes and utilizing rollator basket to carry items. Pt engaged in balance task of throwing horse shoes while maintaining L UE support with close supervision. Pt ambulating short distance and squatting to pick horse shoes up from ground. Pt required frequent rest breaks secondary to fatigue. OT and pt also reviewing how to get up from fall on the floor. Pt able to verbalize and then demonstrate correctly with min questioning cues for task. Pt returning to room with wife providing supervision for safety. Pt seated in recliner chair with call bell and all needed items within reach upon exiting the room.   Therapy Documentation Precautions:  Precautions Precautions: Fall Precaution Comments: BLE knee  buckling Restrictions Weight Bearing Restrictions: No Vital Signs: Therapy Vitals Pulse Rate: 91 BP: 139/70 mmHg  See Function Navigator for Current Functional Status.   Therapy/Group: Individual Therapy  Phineas Semen 10/05/2015, 9:14 AM

## 2015-10-05 NOTE — Patient Care Conference (Signed)
Inpatient RehabilitationTeam Conference and Plan of Care Update Date: 10/04/2015   Time: 2:30 PM    Patient Name: Tanner Mason      Medical Record Number: [id number]  Date of Birth: 08-09-1940 Sex: Male         Room/Bed: 4M09C/4M09C-01 Payor Info: Payor: Advertising copywriter MEDICARE / Plan: [facility] MEDICARE / Product Type: *No Product type* /    Admitting Diagnosis: Famlyts  GBS  Admit Date/Time:  09/26/2015  6:47 PM Admission Comments: No comment available   Primary Diagnosis:  Guillain Barr syndrome (HCC) Principal Problem: Guillain Barr syndrome [facility])  Patient Active Problem List   Diagnosis Date Noted  . Benign essential HTN   . Irregular bowel habits   . Constipation   . Thrombocytopenia (HCC)   . Acute on chronic renal insufficiency (HCC)   . Normocytic anemia   . Type 2 diabetes mellitus with peripheral neuropathy (HCC)   . Bilateral leg weakness   . Idiopathic angioedema   . Leg weakness   . Back pain   . Hypomagnesemia   . Type 2 diabetes mellitus with diabetic neuropathy, unspecified (HCC)   . Guillain Barr syndrome (HCC)   . Weakness 09/17/2015  . Gait abnormality 09/17/2015  . Gait instability 09/17/2015  . Cellulitis and abscess 07/14/2015  . Preventative health care 11/08/2014  . Angio-edema 07/14/2014  . Angioedema 07/13/2014  . Left foot drop 04/05/2014  . Lumbar back pain with radiculopathy affecting left lower extremity 04/05/2014  . Atypical chest pain 12/14/2013  . Hypokalemia 03/16/2013  . Overweight (BMI 25.0-29.9) 09/15/2012  . Stage 3 chronic renal impairment associated with type 2 diabetes mellitus (HCC) 07/20/2010  . ULNAR NERVE ENTRAPMENT, RIGHT 06/07/2008  . Diabetes mellitus (HCC) 12/09/2007  . Essential hypertension, benign 12/08/2007  . DIASTASIS RECTI 12/08/2007  . HYPERLIPIDEMIA 06/26/2006  . ANEMIA, OTHER, UNSPECIFIED 06/26/2006  . Peripheral autonomic neuropathy due to diabetes mellitus (HCC) 06/26/2006  . GASTROESOPHAGEAL REFLUX  DISEASE, CHRONIC 06/26/2006  . Enlarged prostate with lower urinary tract symptoms (LUTS) 06/26/2006    Expected Discharge Date: Expected Discharge Date: 10/10/15  Team Members Present: Physician leading conference: Dr. Claudette Laws Social Worker Present: Staci Acosta, LCSW Nurse Present: Chana Bode, RN PT Present: Teodoro Kil, PT OT Present: Callie Fielding, OT SLP Present: Feliberto Gottron, SLP PPS Coordinator present : Tora Duck, RN, CRRN     Current Status/Progress Goal Weekly Team Focus  Medical   Acute on chronic renal failure improving, diabetes management  Maintain medical stability during rehabilitation stay  Manage hydration as well as medications   Bowel/Bladder   continent of bowel and bladder LBM 11/03/15  To be continent of B&B  monitor b&B function    Swallow/Nutrition/ Hydration             ADL's   steady assist for toileting, shower transfer, and LB dressing - supervision for all other self care tasks  Supervision/setup - Mod I  ADl retraining, transfers, pt/family education, endurance   Mobility   supervision transfers and gait, steady assist on stairs  upgraded to mod I for transfers and short distance gait, supervision for community and stairs  endurance, family education, d/c planning   Communication             Safety/Cognition/ Behavioral Observations            Pain   occasinal hip soreness. tyelenol prn   Pain <3  assess and treat pain prn and tx accordingly    Skin  no new skin breakdown. CDI   No skin breakdown/infection  Assess skin q shift and prn     Rehab Goals Patient on target to meet rehab goals: Yes Rehab Goals Revised: some PT goals were able to be upgraded to modified independent (see above PT notes) *See Care Plan and progress notes for long and short-term goals.  Barriers to Discharge: DM, AKI/CKD, Thrombo-cytopenia    Possible Resolutions to Barriers:  See above    Discharge Planning/Teaching Needs:  Pt plans  to return to his home at d/c with his wife and dtr to provide 24/7 supervision and assistance, as needed.  Pt's wife has been present for therapies and will receive family education, as needed.   Team Discussion:  Pt is doing better medically with renal status slowly improving, but blood sugars are still low.  Pt is doing well in therapy, working hard and making progress.  Transfer and short distance ambulation goals were upgraded to modified independent.  Pt is still working on improving his endurance and is progressing daily.  D/C date moved from 10-13-15 to 10-10-15.  Revisions to Treatment Plan:  none   Continued Need for Acute Rehabilitation Level of Care: The patient requires daily medical management by a physician with specialized training in physical medicine and rehabilitation for the following conditions: Daily direction of a multidisciplinary physical rehabilitation program to ensure safe treatment while eliciting the highest outcome that is of practical value to the patient.: Yes Daily medical management of patient stability for increased activity during participation in an intensive rehabilitation regime.: Yes Daily analysis of laboratory values and/or radiology reports with any subsequent need for medication adjustment of medical intervention for : Nutritional problems;Neurological problems;Diabetes problems  Adan Beal, Vista DeckJennifer Capps 10/05/2015, 1:38 PM

## 2015-10-05 NOTE — Progress Notes (Signed)
Physical Therapy Session Note  Patient Details  Name: Tanner Mason MRN: [id number] Date of Birth: 07-07-1940  Today's Date: 10/05/2015 PT Individual Time: 1100-1200 PT Individual Time Calculation (min): 60 min   Short Term Goals: Week 2:  PT Short Term Goal 1 (Week 2): =LTGs due to ELOS  Skilled Therapeutic Interventions/Progress Updates:    Pt received resting in recliner with no c/o pain and agreeable to therapy session.  Session focus on endurance, gait training in controlled and community environment, real car transfer, stair negotiation and family education.   Gait training throughout hospital to [facility] entrance to practice car transfer.  Gait training on brick, carpet, through doorways and thresholds, on grass, and gravel with rollator with supervision.  PT provided pt education for supervision with community ambulation and having someone on L side in case of LOB.  Pt verbalized understanding.    PT instructed pt and wife, Marlowe Kays, in car transfer with pt's SUV.  Trialed 4 methods of entering/exiting car, all with steady assist>supervision.  Pt and wife feel most comfortable with pt approaching car and holding onto grab bars while Marlowe Kays removes rollator and positions self in front of patient, pt places L foot into car and enters car.  Practiced x2 in this manner with both therapist and then Deepstep providing supervision.    PT instructed pt in stair negotiation outside with R descending rail with 2 UE support on rail and side stepping up/down stairs.  Once back on unit PT instructed pt in side stepping up/down method with R on R and on L x4 steps each side with supervision and mod progress to min verbal cues.  Pt performed again with Marlowe Kays providing supervision.    Pt returned to room, PT signed off Marlowe Kays to assist pt within room.  Pt positioned in recliner with call bell in reach and needs met.   Therapy Documentation Precautions:  Precautions Precautions: Fall Precaution  Comments: BLE knee buckling Restrictions Weight Bearing Restrictions: No   See Function Navigator for Current Functional Status.   Therapy/Group: Individual Therapy  Earnest Conroy Penven-Crew 10/05/2015, 12:04 PM

## 2015-10-05 NOTE — Progress Notes (Signed)
[facility] PHYSICAL MEDICINE & REHABILITATION     PROGRESS NOTE  Subjective/Complaints:   No issues overnite, discussed with RN, bowels getting "too loose" Pt still has problems with numbness and tingling both feet ROS:  Denies CP, SOB, nausea, vomiting, diarrhea.  Objective: Vital Signs: Blood pressure 139/70, pulse 91, temperature 98.2 F (36.8 C), temperature source Oral, resp. rate 18, height 5\' 7"  (1.702 m), weight 68.811 kg (151 lb 11.2 oz), SpO2 100 %. No results found.  Recent Labs  10/03/15 0456 10/04/15 0507  WBC 4.2 3.9*  HGB 9.5* 9.3*  HCT 28.7* 28.2*  PLT 142* 122*    Recent Labs  10/03/15 0456  NA 141  K 3.7  CL 107  GLUCOSE 111*  BUN 18  CREATININE 1.57*  CALCIUM 9.3   CBG (last 3)   Recent Labs  10/04/15 1645 10/04/15 2058 10/05/15 0648  GLUCAP 72 116* 82    Wt Readings from Last 3 Encounters:  10/04/15 68.811 kg (151 lb 11.2 oz)  09/23/15 70.7 kg (155 lb 13.8 oz)  07/14/15 76.204 kg (168 lb)    Physical Exam:  BP 139/70 mmHg  Pulse 91  Temp(Src) 98.2 F (36.8 C) (Oral)  Resp 18  Ht 5\' 7"  (1.702 m)  Wt 68.811 kg (151 lb 11.2 oz)  BMI 23.75 kg/m2  SpO2 100% Constitutional: He appears well-developed and well-nourished. No distress.  HENT: Normocephalic and atraumatic.   Eyes: Conjunctivae and EOM are normal. No scleral icterus.  Cardiovascular: Normal rate and regular rhythm.  Respiratory: Effort normal and breath sounds normal. No respiratory distress. He exhibits no tenderness.  GI: Soft. Bowel sounds are normal. He exhibits no distension. There is no tenderness.  Musculoskeletal: He exhibits no edema or tenderness.  Neurological: He is alert and oriented.  Speech clear.  Mild ptosis on left--chronic.  Motor: 4+/5 bilateral deltoids, biceps, triceps, grip 4/5 hip flexors 4/5 knee extensor, 4/5 ankle dorsiflexor  Sensation decreased to LT below knees bilaterally Skin: Skin is warm and dry. He is not diaphoretic.   Psychiatric: He has a normal mood and affect. His behavior is normal. Judgment and thought content normal   Assessment/Plan: 1. Functional deficits secondary to Guillain Barre Syndrome which require 3+ hours per day of interdisciplinary therapy in a comprehensive inpatient rehab setting. Physiatrist is providing close team supervision and 24 hour management of active medical problems listed below. Physiatrist and rehab team continue to assess barriers to discharge/monitor patient progress toward functional and medical goals.  Function:  Bathing Bathing position   Position: Shower  Bathing parts Body parts bathed by patient: Right arm, Left arm, Chest, Abdomen, Front perineal area, Right upper leg, Left upper leg, Right lower leg, Left lower leg, Buttocks Body parts bathed by helper: Back  Bathing assist Assist Level: Supervision or verbal cues      Upper Body Dressing/Undressing Upper body dressing   What is the patient wearing?: Pull over shirt/dress     Pull over shirt/dress - Perfomed by patient: Thread/unthread right sleeve, Thread/unthread left sleeve, Put head through opening, Pull shirt over trunk          Upper body assist Assist Level: Supervision or verbal cues   Set up : To obtain clothing/put away  Lower Body Dressing/Undressing Lower body dressing   What is the patient wearing?: Underwear, Pants, Socks, Shoes Underwear - Performed by patient: Thread/unthread right underwear leg, Thread/unthread left underwear leg, Pull underwear up/down Underwear - Performed by helper: Pull underwear up/down, Thread/unthread right underwear  leg Pants- Performed by patient: Thread/unthread right pants leg, Thread/unthread left pants leg, Pull pants up/down Pants- Performed by helper: Thread/unthread left pants leg, Pull pants up/down Non-skid slipper socks- Performed by patient: Don/doff right sock, Don/doff left sock Non-skid slipper socks- Performed by helper: Don/doff right  sock Socks - Performed by patient: Don/doff right sock, Don/doff left sock Socks - Performed by helper: Don/doff left sock Shoes - Performed by patient: Don/doff right shoe, Don/doff left shoe, Fasten right, Fasten left Shoes - Performed by helper: Fasten right, Fasten left          Lower body assist Assist for lower body dressing: Touching or steadying assistance (Pt > 75%)      Toileting Toileting   Toileting steps completed by patient: Adjust clothing prior to toileting, Performs perineal hygiene, Adjust clothing after toileting Toileting steps completed by helper: Adjust clothing prior to toileting, Adjust clothing after toileting Toileting Assistive Devices: Grab bar or rail  Toileting assist Assist level: Touching or steadying assistance (Pt.75%)   Transfers Chair/bed transfer   Chair/bed transfer method: Ambulatory Chair/bed transfer assist level: Supervision or verbal cues Chair/bed transfer assistive device: Armrests, Patent attorney     Max distance: 175 Assist level: Supervision or verbal cues   Wheelchair   Type: Manual Max wheelchair distance: >1000 Assist Level: No help, No cues, assistive device, takes more than reasonable amount of time  Cognition Comprehension Comprehension assist level: Understands complex 90% of the time/cues 10% of the time  Expression Expression assist level: Expresses complex 90% of the time/cues < 10% of the time  Social Interaction Social Interaction assist level: Interacts appropriately with others with medication or extra time (anti-anxiety, antidepressant).  Problem Solving Problem solving assist level: Solves complex 90% of the time/cues < 10% of the time  Memory Memory assist level: Recognizes or recalls 90% of the time/requires cueing < 10% of the time    Medical Problem List and Plan: 1. Functional and mobility deficits secondary to Guillain Barre Syndrome  Cont CIR PT, OT with tent D/C 6/13 2. DVT  Prophylaxis/Anticoagulation: Pharmaceutical: Lovenox 3. Pain Management: on Neurontin effective as symptoms improving.  4. Mood: LCSW to follow for evaluation and support.  5. Neuropsych: This patient is capable of making decisions on his own behalf. 6. Skin/Wound Care: Routine pressure relief measures.  7. Fluids/Electrolytes/Nutrition: Encourage po fluid intake.  8. T2DM with peripheral neuropathy: Uncontrolled --Hgb A1c-7.4.   On lantus 50 units  Meal coverage tid decreased on 6/5  Will cont to adjust depending on trend  Continues to be relatively controlled 9. Histamine excess:   Monitor for recurrence of angioedema.   Continue to use benadryl/prednisone prn 10. Normocytic anemia:   Added iron supplement  Hb 9.5 on 6/6, previously ~10.4 , stable 6/7 at 9.3   11. HTN: Monitor BP and adjust medications as indicated.   Continue metoprolol bid, decreased to 75 on 6/1  Norvasc decreased to .   Filed Vitals:   10/05/15 0411 10/05/15 0804  BP: 136/67 139/70  Pulse: 88 91  Temp: 98.2 F (36.8 C)   Resp: 18    12. Acute on chronic renal failure:   Encourage po fluids.  Cr. 1.57 on 6/6, slowly improving BMP Latest Ref Rng 10/03/2015 09/29/2015 09/27/2015  Glucose 65 - 99 mg/dL 213(Y) 865(H) 846(N)  BUN 6 - 20 mg/dL 18 62(X) 52(W)  Creatinine 0.61 - 1.24 mg/dL 4.13(K) 4.40(N) 0.27(O)  Sodium 135 - 145 mmol/L 141 138 138  Potassium 3.5 - 5.1 mmol/L 3.7 3.7 3.7  Chloride 101 - 111 mmol/L 107 108 110  CO2 22 - 32 mmol/L 24 24 19(L)  Calcium 8.9 - 10.3 mg/dL 9.3 9.0 9.0    13. Diarrhea/Constipation:     Bowel meds increased on 6/2, again on 6/3, reviewed again on 6/4  Will change Miralax to prn 14. Thrombocytopenia  Plts 122 on 6/7 stable  LOS (Days) 9 A FACE TO FACE EVALUATION WAS PERFORMED  Harly Pipkins E 10/05/2015 8:08 AM

## 2015-10-06 ENCOUNTER — Inpatient Hospital Stay (HOSPITAL_COMMUNITY): Payer: Medicare Other | Admitting: Physical Therapy

## 2015-10-06 ENCOUNTER — Inpatient Hospital Stay (HOSPITAL_COMMUNITY): Payer: Medicare Other | Admitting: Occupational Therapy

## 2015-10-06 LAB — GLUCOSE, CAPILLARY
Glucose-Capillary: 91 mg/dL (ref 65–99)
Glucose-Capillary: 80 mg/dL (ref 65–99)
Glucose-Capillary: 137 mg/dL — ABNORMAL HIGH (ref 65–99)
Glucose-Capillary: 123 mg/dL — ABNORMAL HIGH (ref 65–99)

## 2015-10-06 NOTE — Progress Notes (Signed)
Social Work Patient ID: Tanner Mason, male   DOB: 30-Dec-1940, 75 y.o.   MRN: [id number]   CSW met with pt and his wife on 10-04-15 to update them on team conference discussion and updated d/c date of 10-10-15.  DME ordered and f/u therapies arranged at [facility].  Then on 10-05-15, CSW received call from insurance case manager that their medical director will not approve pt through the 10-10-15 targeted d/c date.  CSW informed pt/wife and again spoke with case manager to learn how long medical director would approve him to stay and learned it was 10-09-15.  CSW again updated pt/wife and they were pleased with that.  CSW also told therapists and therapy supervisor to make sure that pt had a good weekend of therapies with a Monday d/c pending.  CSW will remain available as needed.  Pt/wife pleased with rehab care with appreciative of CSW advocating on pt's behalf with insurance company.

## 2015-10-06 NOTE — Progress Notes (Signed)
Occupational Therapy Session Note  Patient Details  Name: Tanner Mason MRN: [id number] Date of Birth: 08/13/1940  Today's Date: 10/06/2015 OT Individual Time:  - 1545-1700  (75 min)      Short Term Goals: Week 1:  OT Short Term Goal 1 (Week 1): Pt will complete squat pivot to toilet with min assist OT Short Term Goal 1 - Progress (Week 1): Met OT Short Term Goal 2 (Week 1): Pt will complete LB dressing with mod assist at sit > stand level OT Short Term Goal 2 - Progress (Week 1): Met OT Short Term Goal 3 (Week 1): Pt will complete bathing at sit > stand level with min assist OT Short Term Goal 3 - Progress (Week 1): Met OT Short Term Goal 4 (Week 1): Pt will complete 1 grooming activity in standing with min assist for standing balance. OT Short Term Goal 4 - Progress (Week 1): Met Week 2:  OT Short Term Goal 1 (Week 2): STGs=LTGs secondary to upcoming discharge  Skilled Therapeutic Interventions/Progress Updates:    .Skilled OT intervention with treatment focus on the following:  Balance in standing and half kneeling, functional ambulation.  Pt. Has been made mod I in room.  Ambulated to gym with 4 [place] with distant supervision.  Engaged in balance exercises with OT providing cues for isometric holding through trunk.  Performed half kneeling, quadraped, and Tai chi movement.  LLE weaker than RUE with all movements.  Performed balance activities using Tai chi forms with stepping and holding.  Pt  Engaged in stair climbing with alternating steps and both hand rails and then with same step pattern and holding to one rail with both hands.   OT provided SBA for balance.  Did sitting balance on roll for one minute with feet on the ground.    Ambulated ADL bathroom and pt was continent of BM.  Ambulated back to room and left with all needs in reach.     Therapy Documentation Precautions:  Precautions Precautions: Fall Precaution Comments: BLE knee buckling Restrictions Weight Bearing  Restrictions: No    Vital Signs: Therapy Vitals Temp: 97.9 F (36.6 C) Temp Source: Oral Pulse Rate: 85 Resp: 18 BP: 112/61 mmHg Patient Position (if appropriate): Sitting Oxygen Therapy SpO2: 100 % O2 Device: Not Delivered Pain::  none          See Function Navigator for Current Functional Status.   Therapy/Group: Individual Therapy  Lisa Roca 10/06/2015, 3:48 PM

## 2015-10-06 NOTE — Progress Notes (Signed)
Physical Therapy Session Note  Patient Details  Name: Tanner Mason MRN: [id number] Date of Birth: Mar 19, 1941  Today's Date: 10/06/2015 PT Individual Time: 1010-1110 PT Individual Time Calculation (min): 60 min   Short Term Goals: Week 2:  PT Short Term Goal 1 (Week 2): =LTGs due to ELOS  Skilled Therapeutic Interventions/Progress Updates:    Pt received resting in w/c with no c/o pain and agreeable to therapy session.  Session focus on endurance, gait with rollator, transfers, NMR for balance, and patient education.  Pt asking many questions regarding equipment, progression with mobility up to this point as well as expected progression from this point onward, and relayed his discouragement at feeling that he has plateaued in regards to his progress.  PT provided emotional support, and reminded pt to think about all the challenges he has already overcome.    Gait training to ortho gym, up/down ramp, on mulch, and to main therapy gym with no rest break using rollator with supervision.  Pt engaged in nustep at level 5 x12 minutes with focus on endurance and self pacing.  PT and pt discussed use of cardio equipment at the [facility] at discharge to help maintain and gain endurance. Pt asked appropriate questions and PT answered within scope.    NMR standing on foam wedge for increased challenge to ankle strategy while patient performed card matching task reaching outside BOS for cards and matching to board.  Pt required min/mod assist for task on foam wedge.  Pt completed same task on tile floor and required supervision only.  PT provided pt education on how to make daily tasks more or less challenging as in the card task and pt verbalized understanding.    Pt returned to room and requesting to toilet before therapist leaves.  Pt amb to/from bathroom and performed transfers, hygiene, and clothing management with set up assist.  Pt left upright in recliner with call bell in reach and needs met.     Therapy Documentation Precautions:  Precautions Precautions: Fall Precaution Comments: BLE knee buckling Restrictions Weight Bearing Restrictions: No   See Function Navigator for Current Functional Status.   Therapy/Group: Individual Therapy  Earnest Conroy Penven-Crew 10/06/2015, 12:19 PM

## 2015-10-06 NOTE — Progress Notes (Signed)
Physical Therapy Session Note  Patient Details  Name: Tanner Mason MRN: [id number] Date of Birth: 10/14/1940  Today's Date: 10/06/2015 PT Individual Time: 1300-1335 PT Individual Time Calculation (min): 35 min   Short Term Goals: Week 2:  PT Short Term Goal 1 (Week 2): =LTGs due to ELOS  Skilled Therapeutic Interventions/Progress Updates:    Pt received resting upright in w/c with no c/o pain and agreeable to therapy session.  Session focus on endurance, HEP, gait training, transfer training, and family education/discharge planning.    Pt performs all transfers mod I with rollator.  Pt ambulates up to 1' mod I with rollator.  PT instructed pt in OTAGO level B exercises in // bars.  Discussed with pt and wife adaptations for home including set up, pacing, etc.    Pt returned to room at end of session and left upright in w/c with call bell in reach and needs met.  Mod I sign placed outside door.   Therapy Documentation Precautions:  Precautions Precautions: Fall Precaution Comments: BLE knee buckling Restrictions Weight Bearing Restrictions: No   See Function Navigator for Current Functional Status.   Therapy/Group: Individual Therapy  Tanner Mason 10/06/2015, 2:21 PM

## 2015-10-06 NOTE — Progress Notes (Signed)
Social Work Discharge Note  The overall goal for the admission was met for:   Discharge location: Yes - home  Length of Stay: Yes - 13 days  Discharge activity level: Yes - supervision with some mod I  Home/community participation: Yes  Services provided included: MD, RD, PT, OT, RN, Pharmacy, Neuropsych and SW, TR  Financial Services: Private Insurance: [employer]  Follow-up services arranged: Outpatient: PT/OT at [facility] - Outpt therapies, DME: 18"x18" w/c with basic cushion; bedside commode; rollator; tub seat with back from [place] and Patient/Family has no preference for HH/DME agencies  Comments (or additional information): Pt progressed well and was able to d/c earlier than expected.  Wife and dtr to be with pt initially.  Pt wants to get back to his independence at home.  Patient/Family verbalized understanding of follow-up arrangements: Yes  Individual responsible for coordination of the follow-up plan: pt with wife's support  Confirmed correct DME delivered: Trey Sailors 10/06/2015    Giomar Gusler, Silvestre Mesi

## 2015-10-06 NOTE — Progress Notes (Signed)
[place] PHYSICAL MEDICINE & REHABILITATION     PROGRESS NOTE  Subjective/Complaints:  Tanner issues overnite Still with tingling in feet and decreased balance.  Tried walking without walker with therapist and couldn't amb even a few feet ROS:  Denies CP, SOB, nausea, vomiting, diarrhea.  Objective: Vital Signs: Blood pressure 134/68, pulse 86, temperature 98.2 F (36.8 C), temperature source Oral, resp. rate 16, height 5\' 7"  (1.702 m), weight 68.811 kg (151 lb 11.2 oz), SpO2 99 %. Tanner results found.  Recent Labs  10/04/15 0507  WBC 3.9*  HGB 9.3*  HCT 28.2*  PLT 122*   Tanner results for input(s): NA, K, CL, GLUCOSE, BUN, CREATININE, CALCIUM in the last 72 hours.  Invalid input(s): CO CBG (last 3)   Recent Labs  10/05/15 1632 10/05/15 2022 10/06/15 0656  GLUCAP 101* 105* 91    Wt Readings from Last 3 Encounters:  10/04/15 68.811 kg (151 lb 11.2 oz)  09/23/15 70.7 kg (155 lb 13.8 oz)  07/14/15 76.204 kg (168 lb)    Physical Exam:  BP 134/68 mmHg  Pulse 86  Temp(Src) 98.2 F (36.8 C) (Oral)  Resp 16  Ht 5\' 7"  (1.702 m)  Wt 68.811 kg (151 lb 11.2 oz)  BMI 23.75 kg/m2  SpO2 99% Constitutional: Tanner Mason. Tanner distress.  HENT: Tanner and atraumatic.   Eyes: Conjunctivae and EOM are normal. Tanner scleral icterus.  Cardiovascular: Normal rate and regular rhythm.  Respiratory: Effort normal and breath sounds normal. Tanner respiratory distress. Tanner exhibits Tanner tenderness.  GI: Soft. Bowel sounds are normal. Tanner exhibits Tanner distension. There is Tanner tenderness.  Musculoskeletal: Tanner exhibits Tanner edema or tenderness.  Neurological: Tanner is alert and oriented.  Speech clear.  Tanner ptosis on left--chronic.  Motor: 4+/5 bilateral deltoids, biceps, triceps, grip 4/5 hip flexors 4/5 knee extensor, 4/5 ankle dorsiflexor  Sensation decreased to LT below knees bilaterally Skin: Skin is warm and dry. Tanner is not diaphoretic.  Psychiatric: Tanner has a  normal mood and affect. Tanner behavior is normal. Judgment and thought content normal   Assessment/Plan: 1. Functional deficits secondary to Guillain Barre Syndrome which require 3+ hours per day of interdisciplinary therapy in a comprehensive inpatient rehab setting. Physiatrist is providing close team supervision and 24 hour management of active medical problems listed below. Physiatrist and rehab team continue to assess barriers to discharge/monitor patient progress toward functional and medical goals.  Function:  Bathing Bathing position   Position: Shower  Bathing parts Body parts bathed by patient: Right arm, Left arm, Chest, Abdomen, Front perineal area, Right upper leg, Left upper leg, Right lower leg, Left lower leg, Buttocks Body parts bathed by helper: Back  Bathing assist Assist Level: Supervision or verbal cues      Upper Body Dressing/Undressing Upper body dressing   What is the patient wearing?: Pull over shirt/dress     Pull over shirt/dress - Perfomed by patient: Thread/unthread right sleeve, Thread/unthread left sleeve, Put head through opening, Pull shirt over trunk          Upper body assist Assist Level: Supervision or verbal cues   Set up : To obtain clothing/put away  Lower Body Dressing/Undressing Lower body dressing   What is the patient wearing?: Underwear, Pants, Socks, Shoes Underwear - Performed by patient: Thread/unthread right underwear leg, Thread/unthread left underwear leg, Pull underwear up/down Underwear - Performed by helper: Pull underwear up/down, Thread/unthread right underwear leg Pants- Performed by patient: Thread/unthread right pants leg, Thread/unthread left  pants leg, Pull pants up/down Pants- Performed by helper: Thread/unthread left pants leg, Pull pants up/down Non-skid slipper socks- Performed by patient: Don/doff right sock, Don/doff left sock Non-skid slipper socks- Performed by helper: Don/doff right sock Socks - Performed by  patient: Don/doff right sock, Don/doff left sock Socks - Performed by helper: Don/doff left sock Shoes - Performed by patient: Don/doff right shoe, Don/doff left shoe, Fasten right, Fasten left Shoes - Performed by helper: Fasten right, Fasten left          Lower body assist Assist for lower body dressing: Touching or steadying assistance (Pt > 75%)      Toileting Toileting   Toileting steps completed by patient: Adjust clothing prior to toileting, Performs perineal hygiene, Adjust clothing after toileting Toileting steps completed by helper: Adjust clothing prior to toileting, Adjust clothing after toileting Toileting Assistive Devices: Grab bar or rail  Toileting assist Assist level: Supervision or verbal cues   Transfers Chair/bed transfer   Chair/bed transfer method: Ambulatory Chair/bed transfer assist level: Supervision or verbal cues Chair/bed transfer assistive device: Armrests, Patent attorney     Max distance: 16 Assist level: Supervision or verbal cues   Wheelchair   Type: Manual Max wheelchair distance: >1000 Assist Level: Tanner help, Tanner cues, assistive device, takes more than reasonable amount of time  Cognition Comprehension Comprehension assist level: Understands complex 90% of the time/cues 10% of the time  Expression Expression assist level: Expresses complex 90% of the time/cues < 10% of the time  Social Interaction Social Interaction assist level: Interacts appropriately with others with medication or extra time (anti-anxiety, antidepressant).  Problem Solving Problem solving assist level: Solves complex 90% of the time/cues < 10% of the time  Memory Memory assist level: Recognizes or recalls 90% of the time/requires cueing < 10% of the time    Medical Problem List and Plan: 1. Functional and mobility deficits secondary to Guillain Barre Syndrome  Cont CIR PT, OT with tent D/C 6/12, discussed usual pattern of recovery for GBS 2. DVT  Prophylaxis/Anticoagulation: Pharmaceutical: Lovenox 3. Pain Management: on Neurontin effective as symptoms improving.  4. Mood: LCSW to follow for evaluation and support.  5. Neuropsych: Tanner patient is capable of making decisions on Tanner own behalf. 6. Skin/Wound Care: Routine pressure relief measures.  7. Fluids/Electrolytes/Nutrition: Encourage po fluid intake.  8. T2DM with peripheral neuropathy: Uncontrolled --Hgb A1c-7.4.   On lantus 50 units  Meal coverage tid decreased on 6/5  Will cont to adjust depending on trend   CBG (last 3)   Recent Labs  10/05/15 1632 10/05/15 2022 10/06/15 0656  GLUCAP 101* 105* 91    9. Histamine excess:   Monitor for recurrence of angioedema.   Continue to use benadryl/prednisone prn 10. Normocytic anemia:   Added iron supplement  Hb 9.5 on 6/6, previously ~10.4 , stable 6/7 at 9.3   11. HTN: Monitor BP and adjust medications as indicated. , Orthostatic hypotension improved  Continue metoprolol bid, decreased to 75 on 6/1  Norvasc decreased to .   Filed Vitals:   10/05/15 1500 10/06/15 0503  BP: 135/65 134/68  Pulse: 85 86  Temp: 98.2 F (36.8 C) 98.2 F (36.8 C)  Resp: 18 16   12. Acute on chronic renal failure:   Encourage po fluids.  Cr. 1.57 on 6/6, slowly improving BMP Latest Ref Rng 10/03/2015 09/29/2015 09/27/2015  Glucose 65 - 99 mg/dL 161(W) 960(A) 540(J)  BUN 6 - 20 mg/dL 18 81(X)  28(H)  Creatinine 0.61 - 1.24 mg/dL 0.86(V1.57(H) 7.84(O1.61(H) 9.62(X1.74(H)  Sodium 135 - 145 mmol/L 141 138 138  Potassium 3.5 - 5.1 mmol/L 3.7 3.7 3.7  Chloride 101 - 111 mmol/L 107 108 110  CO2 22 - 32 mmol/L 24 24 19(L)  Calcium 8.9 - 10.3 mg/dL 9.3 9.0 9.0    13. Diarrhea/Constipation:     Bowel meds increased on 6/2, again on 6/3, reviewed again on 6/4  Will change Miralax to prn, one loose BM on 6/8 14. Thrombocytopenia  Plts 122 on 6/7 stable  LOS (Days) 10 A FACE TO FACE EVALUATION WAS PERFORMED  Tanner Mason 10/06/2015 8:02 AM

## 2015-10-06 NOTE — Progress Notes (Signed)
Occupational Therapy Session Note  Patient Details  Name: Tanner Mason MRN: [id number] Date of Birth: 07/20/1940  Today's Date: 10/06/2015 OT Individual Time: [id number] OT Individual Time Calculation (min): 31 min    Skilled Therapeutic Interventions/Progress Updates:    Pt ambulated with hand held assist to the therapy gym with min assist.  Worked on static and dynamic standing balance during session.  Had pt stand on foam wedge statically with min assist progressing to min guard assist.  LOB noted with small head turns, requiring mod assist to self correct.  Worked on picking up small cones from various areas of the floor in the gym, having pt ambulate without assistive device or UE support.  Min guard assist with cueing to stand completely before transitioning to picking up the next cone.  Pt ambulated back to the room with min assist and therapist providing facilitation at the rib cage to help decreased trunk lateral flexion with stepping.  Pt left in bedside chair with call button in reach and wife present.    Therapy Documentation Precautions:  Precautions Precautions: Fall Precaution Comments: BLE knee buckling Restrictions Weight Bearing Restrictions: No  Pain: Pain Assessment Pain Assessment: No/denies pain ADL: See Function Navigator for Current Functional Status.   Therapy/Group: Individual Therapy  Chiana Wamser OTR/L 10/06/2015, 4:09 PM

## 2015-10-07 ENCOUNTER — Inpatient Hospital Stay (HOSPITAL_COMMUNITY): Payer: Medicare Other

## 2015-10-07 ENCOUNTER — Inpatient Hospital Stay (HOSPITAL_COMMUNITY): Payer: Medicare Other | Admitting: Physical Therapy

## 2015-10-07 ENCOUNTER — Inpatient Hospital Stay (HOSPITAL_COMMUNITY): Payer: Medicare Other | Admitting: Occupational Therapy

## 2015-10-07 LAB — GLUCOSE, CAPILLARY
Glucose-Capillary: 98 mg/dL (ref 65–99)
Glucose-Capillary: 82 mg/dL (ref 65–99)
Glucose-Capillary: 92 mg/dL (ref 65–99)
Glucose-Capillary: 113 mg/dL — ABNORMAL HIGH (ref 65–99)

## 2015-10-07 NOTE — Progress Notes (Signed)
Physical Therapy Session Note  Patient Details  Name: Tanner RueCharles A Minogue MRN: [id number] Date of Birth: Aug 20, 1940  Today's Date: 10/07/2015 PT Individual Time: [phone] and 1245-1326 PT Individual Time Calculation (min): 76 min and 41 min  Short Term Goals: Week 2:  PT Short Term Goal 1 (Week 2): =LTGs due to ELOS  Skilled Therapeutic Interventions/Progress Updates:   Pt received in recliner; pt reports feeling very comfortable with all education so far with wife and feels prepared for D/C home.  Discussed areas of continued focus; pt would like to continue to address stair negotiation (flight of stairs for [place]), endurance, balance and core stability.  Performed transfer from recliner and ambulation to gym with rollator mod I.  Pt set up on Nustep for bilat LE and UE strengthening, coordination and endurance training x 10 minutes at level 5 resistance at moderate intensity.  Set up 2 platform step and pt verbalized and return demonstrated 2 step negotiation x 2 reps with rollator and supervision overall except assistance to lift rollator onto next step each time but able to safely sequence and manage brakes. Reviewed safe stair negotiation for full flight with bilat rails 1/2 way transitioning to R rail only: pt performed 2 flights of stairs beginning with 2 rails forwards >> R rail laterally with supervision-min A overall with verbal cues for safe sequence and transitioning of UE; also discussed pt purchasing or borrowing extra RW to keep at each level at [place] so that wife does not have to carry rollator up/down flight of stairs.  Pt verbalized agreement. Performed core and proximal stabilization training seated on therapy ball with focus on core activation, pelvic tilts during weight shifting, core stability during bilat UE dynamic movements, trunk rotation and alternating LE movements with min A for balance.  Returned to room and pt left in recliner with all items within reach.  PM  session: Pt received in recliner with wife present.  Discussed pt's progress and stair training this am and recommendation for obtaining extra RW for [place] levels to prevent carrying rollator up/down stairs.  Wife verbalized agreement.  Pt agreeable to gait/balance training with [employer].  Pt ambulated to gym with rollator mod I.  Pt set up in Maxi sky for NMR; see below.  Pt returned to room with rollator mod I and left in recliner to rest with wife present.    Therapy Documentation Precautions:  Precautions Precautions: Fall Precaution Comments: BLE knee buckling Restrictions Weight Bearing Restrictions: No Vital Signs: Therapy Vitals Temp: 97.5 F (36.4 C) Temp Source: Oral Pulse Rate: 78 Resp: 18 BP: (!) 134/41 mmHg Patient Position (if appropriate): Lying Oxygen Therapy SpO2: 100 % O2 Device: Not Delivered Pain: Pain Assessment Pain Assessment: 0-10 Pain Score: 6  Pain Type: Acute pain Pain Location: Back Pain Orientation: Lower Pain Descriptors / Indicators: Aching Pain Frequency: Intermittent Pain Onset: On-going Patients Stated Pain Goal: 2 Pain Intervention(s): Medication (See eMAR);Repositioned Multiple Pain Sites: Nono c/o pain Other Treatments: Treatments Neuromuscular Facilitation: Right;Left;Lower Extremity;Activity to increase coordination;Activity to increase motor control;Activity to increase timing and sequencing;Activity to increase sustained activation;Activity to increase lateral weight shifting;Activity to increase anterior-posterior weight shifting during gait training in maxi sky for support (therapist providing min-mod A during LOB to recover balance) during gait x 25' x 8 reps with a mixture of forwards, retro, R and L lateral stepping, pivoting without UE support and then 4 reps forwards gait with use of walking poles for increased activation of core and  proximal stabilization.  Required verbal cues to widen BOS and for full step and stride length  bilaterally. Pt tolerated well.     See Function Navigator for Current Functional Status.   Therapy/Group: Individual Therapy  Edman Circle [facility] 10/07/2015, 9:40 AM

## 2015-10-07 NOTE — Progress Notes (Signed)
Occupational Therapy Session Note  Patient Details  Name: Tanner Mason MRN: [id number] Date of Birth: 1941-03-23  Today's Date: 10/07/2015 OT Individual Time: 1400-1430 OT Individual Time Calculation (min): 30 min    Short Term Goals: Week 2:  OT Short Term Goal 1 (Week 2): STGs=LTGs secondary to upcoming discharge  Skilled Therapeutic Interventions/Progress Updates: Therapeutic exercise with emphasis on BUE strengthening using theraband and hand grip/pinch strengthening using theraputty with written HEP provided.   Pt received seated in his recliner with spouse present.   OT promotes review of BADL and goals after pt declares plan for early d/c, 10/09/15 d/t insurance coverage limitations.   Spouse confirms family ed completed for supervision of BADL and mobility however both pt and spouse endorse awareness of residual weakness relating to dynamic balance and gross UE strength.   Pt receptive for re-ed on therex (BUE and hands) and requested written HEP, if possible.   OT provides pt ed HEP written and supervised pt with performance of UE strengthening, 6 exercises 3-5 reps for supervision with intermittent correction of movements.   Pt completed seated chest press, seated row, scapular chest pull, scapular pull down, arm curl, triceps press down as spouse observed.   OT advised completion of HEP every other day, 10-15 reps, 1 set, seated to progress to standing as shown in HEP within proximity of supports and supervision for safety.   Theraputty exercises provided w/o demonstration although pt able to appreciate literature provided without need for clarification or demonstration of each exercise.     Therapy Documentation Precautions:  Precautions Precautions: Fall Precaution Comments: BLE knee buckling Restrictions Weight Bearing Restrictions: No  Vital Signs: Therapy Vitals Temp: 97.5 F (36.4 C) Temp Source: Oral Pulse Rate: 78 Resp: 18 BP: 117/67 mmHg Patient Position (if  appropriate): Sitting Oxygen Therapy SpO2: 99 % O2 Device: Not Delivered   Pain: No/denies pain    See Function Navigator for Current Functional Status.   Therapy/Group: Individual Therapy  Sharna Gabrys 10/07/2015, 4:56 PM

## 2015-10-07 NOTE — Progress Notes (Signed)
Occupational Therapy Session Note  Patient Details  Name: Dorthula RueCharles A Pine MRN: [id number] Date of Birth: 1941-04-27  Today's Date: 10/07/2015 OT Individual Time: 1100-1200 OT Individual Time Calculation (min): 60 min    Short Term Goals: Week 2:  OT Short Term Goal 1 (Week 2): STGs=LTGs secondary to upcoming discharge  Skilled Therapeutic Interventions/Progress Updates:    Upon entering the room, pt seated in recliner chair with wife present in room. Pt with no c/o pain this session. Family education continued in regards to ADLs. Pt ambulated in room with mod I to obtain all needed items for shower. Caregiver provided supervision for pt transfer into shower with simulated step over for threshold. Pt performed bathing from shower chair with supervision as well as dressing with sit <>stand within bathroom. Pt standing at sink for grooming tasks with supervision for balance with no UE support during tasks. Pt returning to recliner chair at end of session for lunch with all needed items within reach. OT educated wife on need for no slip bath mat outside of shower as well as need for hand held shower head.   Therapy Documentation Precautions:  Precautions Precautions: Fall Precaution Comments: BLE knee buckling Restrictions Weight Bearing Restrictions: No    See Function Navigator for Current Functional Status.   Therapy/Group: Individual Therapy  Lowella Gripittman, Lanny Donoso L 10/07/2015, 12:23 PM

## 2015-10-07 NOTE — Progress Notes (Signed)
[place] [facility]ILITATION     PROGRESS NOTE  Subjective/Complaints:  Improving mobility. Feet still tingling/numb ROS:  Denies CP, SOB, nausea, vomiting, diarrhea.  Objective: Vital Signs: Blood pressure 134/41, pulse 78, temperature 97.5 F (36.4 C), temperature source Oral, resp. rate 18, height 5\' 7"  (1.702 m), weight 68.811 kg (151 lb 11.2 oz), SpO2 100 %. No results found. No results for input(s): WBC, HGB, HCT, PLT in the last 72 hours. No results for input(s): NA, K, CL, GLUCOSE, BUN, CREATININE, CALCIUM in the last 72 hours.  Invalid input(s): CO CBG (last 3)   Recent Labs  10/06/15 1656 10/06/15 2054 10/07/15 0646  GLUCAP 137* 123* 98    Wt Readings from Last 3 Encounters:  10/04/15 68.811 kg (151 lb 11.2 oz)  09/23/15 70.7 kg (155 lb 13.8 oz)  07/14/15 76.204 kg (168 lb)    Physical Exam:  BP 134/41 mmHg  Pulse 78  Temp(Src) 97.5 F (36.4 C) (Oral)  Resp 18  Ht 5\' 7"  (1.702 m)  Wt 68.811 kg (151 lb 11.2 oz)  BMI 23.75 kg/m2  SpO2 100% Constitutional: He appears well-developed and well-nourished. No distress.  HENT: Normocephalic and atraumatic.   Eyes: Conjunctivae and EOM are normal. No scleral icterus.  Cardiovascular: Normal rate and regular rhythm.  Respiratory: Effort normal and breath sounds normal. No respiratory distress. He exhibits no tenderness.  GI: Soft. Bowel sounds are normal. He exhibits no distension. There is no tenderness.  Musculoskeletal: He exhibits no edema or tenderness.  Neurological: He is alert and oriented.  Speech clear.  Mild ptosis on left--chronic.  Motor: 4+/5 bilateral deltoids, biceps, triceps, grip 4/5 hip flexors 4/5 knee extensor, 4/5 ankle dorsiflexor  Sensation decreased to LT below knees bilaterally Skin: Skin is warm and dry. He is not diaphoretic.  Psychiatric: He has a normal mood and affect. His behavior is normal. Judgment and thought content normal   Assessment/Plan: 1.  Functional deficits secondary to Guillain Barre Syndrome which require 3+ hours per day of interdisciplinary therapy in a comprehensive inpatient rehab setting. Physiatrist is providing close team supervision and 24 hour management of active medical problems listed below. Physiatrist and rehab team continue to assess barriers to discharge/monitor patient progress toward functional and medical goals.  Function:  Bathing Bathing position   Position: Shower  Bathing parts Body parts bathed by patient: Right arm, Left arm, Chest, Abdomen, Front perineal area, Right upper leg, Left upper leg, Right lower leg, Left lower leg, Buttocks Body parts bathed by helper: Back  Bathing assist Assist Level: Supervision or verbal cues      Upper Body Dressing/Undressing Upper body dressing   What is the patient wearing?: Pull over shirt/dress     Pull over shirt/dress - Perfomed by patient: Thread/unthread right sleeve, Thread/unthread left sleeve, Put head through opening, Pull shirt over trunk          Upper body assist Assist Level: Supervision or verbal cues   Set up : To obtain clothing/put away  Lower Body Dressing/Undressing Lower body dressing   What is the patient wearing?: Underwear, Pants, Socks, Shoes Underwear - Performed by patient: Thread/unthread right underwear leg, Thread/unthread left underwear leg, Pull underwear up/down Underwear - Performed by helper: Pull underwear up/down, Thread/unthread right underwear leg Pants- Performed by patient: Thread/unthread right pants leg, Thread/unthread left pants leg, Pull pants up/down Pants- Performed by helper: Thread/unthread left pants leg, Pull pants up/down Non-skid slipper socks- Performed by patient: Don/doff right sock, Don/doff left  sock Non-skid slipper socks- Performed by helper: Don/doff right sock Socks - Performed by patient: Don/doff right sock, Don/doff left sock Socks - Performed by helper: Don/doff left sock Shoes -  Performed by patient: Don/doff right shoe, Don/doff left shoe, Fasten right, Fasten left Shoes - Performed by helper: Fasten right, Fasten left          Lower body assist Assist for lower body dressing: Touching or steadying assistance (Pt > 75%)      Toileting Toileting   Toileting steps completed by patient: Adjust clothing prior to toileting, Performs perineal hygiene, Adjust clothing after toileting Toileting steps completed by helper: Adjust clothing prior to toileting, Adjust clothing after toileting Toileting Assistive Devices: Grab bar or rail  Toileting assist Assist level: No help/no cues   Transfers Chair/bed transfer   Chair/bed transfer method: Ambulatory Chair/bed transfer assist level: No Help, no cues, assistive device, takes more than a reasonable amount of time Chair/bed transfer assistive device: Armrests, Patent attorney     Max distance: 180 Assist level: Supervision or verbal cues   Wheelchair   Type: Manual Max wheelchair distance: >1000 Assist Level: No help, No cues, assistive device, takes more than reasonable amount of time  Cognition Comprehension Comprehension assist level: Understands complex 90% of the time/cues 10% of the time  Expression Expression assist level: Expresses complex 90% of the time/cues < 10% of the time  Social Interaction Social Interaction assist level: Interacts appropriately with others with medication or extra time (anti-anxiety, antidepressant).  Problem Solving Problem solving assist level: Solves basic 90% of the time/requires cueing < 10% of the time  Memory Memory assist level: Recognizes or recalls 90% of the time/requires cueing < 10% of the time    Medical Problem List and Plan: 1. Functional and mobility deficits secondary to Guillain Barre Syndrome  Cont CIR PT, OT with planned D/C 6/12, again discussed usual pattern of recovery for GBS 2. DVT Prophylaxis/Anticoagulation: Pharmaceutical:  Lovenox 3. Pain Management: on Neurontin effective as symptoms improving.  4. Mood: LCSW to follow for evaluation and support.  5. Neuropsych: This patient is capable of making decisions on his own behalf. 6. Skin/Wound Care: Routine pressure relief measures.  7. Fluids/Electrolytes/Nutrition: Encourage po fluid intake.  8. T2DM with peripheral neuropathy: Uncontrolled --Hgb A1c-7.4.   On lantus 50 units  Meal coverage tid decreased on 6/5  Will cont to adjust depending on trend---sugar control reasonable at present   CBG (last 3)   Recent Labs  10/06/15 1656 10/06/15 2054 10/07/15 0646  GLUCAP 137* 123* 98    9. Histamine excess:   Monitor for recurrence of angioedema.   Continue to use benadryl/prednisone prn 10. Normocytic anemia:   Added iron supplement  Hb 9.5 on 6/6, previously ~10.4 , stable 6/7 at 9.3   11. HTN: Monitor BP and adjust medications as indicated. , Orthostatic hypotension improved  Continue metoprolol bid, decreased to 75 on 6/1  Norvasc decreased to .   Filed Vitals:   10/06/15 1352 10/07/15 0542  BP: 112/61 134/41  Pulse: 85 78  Temp: 97.9 F (36.6 C) 97.5 F (36.4 C)  Resp: 18 18   12. Acute on chronic renal failure:   Encourage po fluids.  Cr. 1.57 on 6/6, slowly improving BMP Latest Ref Rng 10/03/2015 09/29/2015 09/27/2015  Glucose 65 - 99 mg/dL 213(Y) 865(H) 846(N)  BUN 6 - 20 mg/dL 18 62(X) 52(W)  Creatinine 0.61 - 1.24 mg/dL 4.13(K) 4.40(N) 0.27(O)  Sodium  135 - 145 mmol/L 141 138 138  Potassium 3.5 - 5.1 mmol/L 3.7 3.7 3.7  Chloride 101 - 111 mmol/L 107 108 110  CO2 22 - 32 mmol/L 24 24 19(L)  Calcium 8.9 - 10.3 mg/dL 9.3 9.0 9.0    13. Diarrhea/Constipation:   Miralax to prn---had soft formed stool yesterday 14. Thrombocytopenia  Plts 122 on 6/7 stable  LOS (Days) 11 A FACE TO FACE EVALUATION WAS PERFORMED  Tniyah Nakagawa T 10/07/2015 8:53 AM

## 2015-10-08 ENCOUNTER — Inpatient Hospital Stay (HOSPITAL_COMMUNITY): Payer: Medicare Other | Admitting: Occupational Therapy

## 2015-10-08 ENCOUNTER — Inpatient Hospital Stay (HOSPITAL_COMMUNITY): Payer: Medicare Other | Admitting: Physical Therapy

## 2015-10-08 LAB — GLUCOSE, CAPILLARY
Glucose-Capillary: 69 mg/dL (ref 65–99)
Glucose-Capillary: 74 mg/dL (ref 65–99)
Glucose-Capillary: 103 mg/dL — ABNORMAL HIGH (ref 65–99)
Glucose-Capillary: 65 mg/dL (ref 65–99)
Glucose-Capillary: 167 mg/dL — ABNORMAL HIGH (ref 65–99)

## 2015-10-08 MED ORDER — INSULIN GLARGINE 100 UNIT/ML ~~LOC~~ SOLN
40.0000 [IU] | Freq: Every day | SUBCUTANEOUS | Status: DC
  Administered 2015-10-09: 40 [IU] via SUBCUTANEOUS
  Filled 2015-10-08 (×2): qty 0.4

## 2015-10-08 NOTE — Significant Event (Signed)
Hypoglycemic Event  CBG: 69  Treatment: 15 GM carbohydrate snack  Symptoms: None  Follow-up CBG: Time:0657 CBG Result:74  Possible Reasons for Event: Unknown  Comments/MD notified:treated per protocol    Tanner MartinezMurray, Tanner Mason A

## 2015-10-08 NOTE — Plan of Care (Signed)
Problem: RH Bed to Chair Transfers Goal: LTG Patient will perform bed/chair transfers w/assist (PT) LTG: Patient will perform bed/chair transfers with assistance, with/without cues (PT).  Outcome: Completed/Met Date Met:  10/08/15 With rollator  Problem: RH Ambulation Goal: LTG Patient will ambulate in controlled environment (PT) LTG: Patient will ambulate in a controlled environment, # of feet with assistance (PT).  Outcome: Completed/Met Date Met:  10/08/15 200 ft with rollator Goal: LTG Patient will ambulate in home environment (PT) LTG: Patient will ambulate in home environment, # of feet with assistance (PT).  Outcome: Completed/Met Date Met:  10/08/15 50 ft with rollator  Problem: RH Wheelchair Mobility Goal: LTG Patient will propel w/c in controlled environment (PT) LTG: Patient will propel wheelchair in controlled environment, # of feet with assist (PT)  Outcome: Adequate for Discharge Pt to d/c at ambulatory level Goal: LTG Patient will propel w/c in community environment (PT) LTG: Patient will propel wheelchair in community environment, # of feet with assist (PT)  Outcome: Adequate for Discharge Pt to d/c at ambulatory level  Problem: RH Stairs Goal: LTG Patient will ambulate up and down stairs w/assist (PT) LTG: Patient will ambulate up and down # of stairs with assistance (PT)  Outcome: Completed/Met Date Met:  10/08/15 2 steps + 2 steps with rollator

## 2015-10-08 NOTE — Progress Notes (Signed)
Physical Therapy Discharge Summary  Patient Details  Name: Tanner Mason MRN: [id number] Date of Birth: 11-26-1940  Today's Date: 10/08/2015 PT Individual Time: 1001-1046 and 4132-4401 PT Individual Time Calculation (min): 45 min and 75 min   Patient has met 11 of 11 long term goals due to improved activity tolerance, improved balance, improved postural control, increased strength, increased range of motion, ability to compensate for deficits and improved awareness.  Patient to discharge at an ambulatory level Modified Independentwith rollator.   Patient's care partner is independent to provide the necessary physical assistance at discharge.  Reasons goals not met: N/A  Recommendation:  Patient will benefit from ongoing skilled PT services in outpatient setting to continue to advance safe functional mobility, address ongoing impairments in decreased BLE strength, impaired standing balance, progress ambulation to more LRAD, decreased endurance, and minimize fall risk.  Equipment: rollator, w/c, shower seat, 3-in-1 BSC  Reasons for discharge: treatment goals met  Patient/family agrees with progress made and goals achieved: Yes  Skilled PT treatment: Treatment 1: Pt received in recliner & agreeable to PT, noting 5/10 back pain but premedicated. Manual testing completed; please see flow sheet for details. Pt able to ambulate with rollator throughout session with Mod I, complete bed mobility & furniture transfers with Mod I, as well as negotiate 12 steps with R rail & Mod I. Pt requires supervision for the following: car transfers with rollator, negotiating 2 steps with rollator, & floor transfer. Pt voices no concerns regarding d/c home. Pt scored 35/56 on Berg Balance test; educated pt on interpretation of score & continued need for use of rollator. Patient demonstrates increased fall risk as noted by score of 35/56 on Berg Balance Scale.  (<36= high risk for falls, close to 100%; 37-45  significant >80%; 46-51 moderate >50%; 52-55 lower >25%). At end of session pt left in recliner in room with all needs within reach.  Treatment 2: Pt received in recliner & agreeable to PT. Pt denied c/o pain & wife present for treatment session; pt's wife voices no concerns regarding pt's d/c home tomorrow. Gait training off of unit & outside for endurance training. Pt able to negotiate 5 steps with L ascending rail & BUE support with mod I. While off of unit pt able to ambulate over uneven surfaces, ramps, and brick; pt ambulated to [place] also. Back on unit pt utilized nu-step, increasing to level 4 x 12 minutes; pt reported 13 on Borg RPE scale. Utilized Warden/ranger games for neuro re-ed; pt progressed from completing games with BUE support to no UE support. Pt with improving weight shifts during activity. Pt ambulated an additional 200 ft for endurance training. At end of session pt left in recliner with all needs within reach & wife present to supervise.  PT Discharge Precautions/Restrictions Restrictions Weight Bearing Restrictions: No  Pain Pain Assessment Pain Assessment: 0-10 Pain Score: 5  Pain Location: Back Pain Intervention(s): Repositioned;Ambulation/increased activity (premedicated per pt)  Vision/Perception  Wears glasses at all times at baseline. Pt reports no changes in vision while in CIR.  Cognition Overall Cognitive Status: Within Functional Limits for tasks assessed Orientation Level: Oriented X4;Oriented to person;Oriented to place;Oriented to time;Oriented to situation Attention: Focused Memory: Appears intact Awareness: Appears intact Problem Solving: Appears intact Safety/Judgment: Appears intact  Sensation Sensation Light Touch:  (BLE baseline neuropathy, tingling in B feet) Proprioception: Impaired Detail (impaired R foot, last 2 toes) Coordination Gross Motor Movements are Fluid and Coordinated: Yes Heel Shin Test:  (BLE  equal)  Motor   Motor Motor:  (improved strength in BLE) Motor - Discharge Observations: BLE weakness   Mobility Bed Mobility Bed Mobility: Rolling Right;Rolling Left;Supine to Sit;Sit to Supine (regular bed) Rolling Right: 6: Modified independent (Device/Increase time) Rolling Left: 6: Modified independent (Device/Increase time) Supine to Sit: 6: Modified independent (Device/Increase time) Sit to Supine: 6: Modified independent (Device/Increase time) Transfers Transfers: Yes Sit to Stand: 6: Modified independent (Device/Increase time) Stand to Sit: 6: Modified independent (Device/Increase time)  Locomotion  Ambulation Ambulation: Yes Ambulation/Gait Assistance: 6: Modified independent (Device/Increase time) Ambulation Distance (Feet): 200 Feet Assistive device: Rollator Gait Gait: Yes Gait Pattern: Poor foot clearance - left (intermittent) Stairs / Additional Locomotion Stairs: Yes Stairs Assistance: 5: Supervision (full flight with R rail & BUE support = Mod I, 2 steps with rollator & supervision) Stair Management Technique: No rails (with rollator) Number of Stairs: 2 Height of Stairs: 6 Ramp: 6: Modified independent (Device) (with rollator) Wheelchair Mobility Wheelchair Mobility: No   Trunk/Postural Assessment  Cervical Assessment Cervical Assessment: Within Functional Limits Thoracic Assessment Thoracic Assessment: Within Functional Limits Lumbar Assessment Lumbar Assessment: Within Functional Limits Postural Control Postural Control: Within Functional Limits   Balance Balance Balance Assessed: Yes Standardized Balance Assessment Standardized Balance Assessment: Berg Balance Test Berg Balance Test Sit to Stand: Able to stand without using hands and stabilize independently Standing Unsupported: Able to stand safely 2 minutes Sitting with Back Unsupported but Feet Supported on Floor or Stool: Able to sit safely and securely 2 minutes Stand to Sit: Sits safely with minimal  use of hands Transfers: Able to transfer safely, definite need of hands Standing Unsupported with Eyes Closed: Able to stand 10 seconds with supervision Standing Ubsupported with Feet Together: Able to place feet together independently and stand for 1 minute with supervision From Standing, Reach Forward with Outstretched Arm: Can reach confidently >25 cm (10") From Standing Position, Pick up Object from Floor: Able to pick up shoe, needs supervision From Standing Position, Turn to Look Behind Over each Shoulder: Needs supervision when turning Turn 360 Degrees: Needs assistance while turning (able to turn to L in 26 seconds with close supervision but lost balance when turning to R) Standing Unsupported, Alternately Place Feet on Step/Stool: Able to complete 4 steps without aid or supervision (completes 8 steps in 23 seconds with close supervision) Standing Unsupported, One Foot in Front: Loses balance while stepping or standing Standing on One Leg: Unable to try or needs assist to prevent fall (holds 1 second) Total Score: 35  Static Sitting Balance Static Sitting - Level of Assistance: 7: Independent Dynamic Sitting Balance Dynamic Sitting - Balance Support: No upper extremity supported Dynamic Sitting - Level of Assistance: 6: Modified independent (Device/Increase time) Static Standing Balance Static Standing - Balance Support: Bilateral upper extremity supported (on rollator) Static Standing - Level of Assistance: 6: Modified independent (Device/Increase time)  Extremity Assessment RLE Assessment RLE Assessment: Within Functional Limits RLE Strength Right Hip Flexion: 4/5 Right Knee Flexion: 4+/5 Right Knee Extension: 4/5 Right Ankle Dorsiflexion: 3-/5 LLE Assessment LLE Assessment: Within Functional Limits LLE Strength Left Hip Flexion: 4/5 Left Knee Flexion: 4+/5 Left Knee Extension: 4/5 Left Ankle Dorsiflexion: 3-/5   See Function Navigator for Current Functional  Status.  Waunita Schooner 10/08/2015, 10:29 AM

## 2015-10-08 NOTE — Progress Notes (Addendum)
[place] PHYSICAL MEDICINE & REHABILITATION     PROGRESS NOTE  Subjective/Complaints:  Improving mobility. Feet still tingling/numb ROS:  Denies CP, SOB, nausea, vomiting, diarrhea.  Objective: Vital Signs: Blood pressure 139/62, pulse 77, temperature 97.8 F (36.6 C), temperature source Oral, resp. rate 18, height  (1.702 m), weight 68.811 kg (151 lb 11.2 oz), SpO2 99 %. No results found. No results for input(s): WBC, HGB, HCT, PLT in the last 72 hours. No results for input(s): NA, K, CL, GLUCOSE, BUN, CREATININE, CALCIUM in the last 72 hours.  Invalid input(s): CO CBG (last 3)   Recent Labs  10/07/15 2058 10/08/15 0644 10/08/15 0657  GLUCAP 113* 69 74    Wt Readings from Last 3 Encounters:  10/04/15 68.811 kg (151 lb 11.2 oz)  09/23/15 70.7 kg (155 lb 13.8 oz)  07/14/15 76.204 kg (168 lb)    Physical Exam:  BP 139/62 mmHg  Pulse 77  Temp(Src) 97.8 F (36.6 C) (Oral)  Resp 18  Ht  (1.702 m)  Wt 68.811 kg (151 lb 11.2 oz)  BMI 23.75 kg/m2  SpO2 99% Constitutional: He appears well-developed and well-nourished. No distress.  HENT: Normocephalic and atraumatic.   Eyes: Conjunctivae and EOM are normal. No scleral icterus.  Cardiovascular: Normal rate and regular rhythm.  Respiratory: Effort normal and breath sounds normal. No respiratory distress. He exhibits no tenderness.  GI: Soft. Bowel sounds are normal. He exhibits no distension. There is no tenderness.  Musculoskeletal: He exhibits no edema or tenderness.  Neurological: He is alert and oriented.  Speech clear.  Mild ptosis on left--chronic.  Motor: 4+/5 bilateral deltoids, biceps, triceps, grip 4/5 hip flexors 4/5 knee extensor, 4/5 ankle dorsiflexor  Sensation decreased to LT below knees bilaterally Skin: Skin is warm and dry. He is not diaphoretic.  Psychiatric: He has a normal mood and affect. His behavior is normal. Judgment and thought content normal   Assessment/Plan: 1. Functional  deficits secondary to Guillain Barre Syndrome which require 3+ hours per day of interdisciplinary therapy in a comprehensive inpatient rehab setting. Physiatrist is providing close team supervision and 24 hour management of active medical problems listed below. Physiatrist and rehab team continue to assess barriers to discharge/monitor patient progress toward functional and medical goals.  Function:  Bathing Bathing position   Position: Shower  Bathing parts Body parts bathed by patient: Right arm, Left arm, Chest, Abdomen, Front perineal area, Right upper leg, Left upper leg, Right lower leg, Left lower leg, Buttocks Body parts bathed by helper: Back  Bathing assist Assist Level: Supervision or verbal cues      Upper Body Dressing/Undressing Upper body dressing   What is the patient wearing?: Pull over shirt/dress     Pull over shirt/dress - Perfomed by patient: Thread/unthread right sleeve, Thread/unthread left sleeve, Put head through opening, Pull shirt over trunk          Upper body assist Assist Level: More than reasonable time   Set up : To obtain clothing/put away  Lower Body Dressing/Undressing Lower body dressing   What is the patient wearing?: Underwear, Pants, Socks, Shoes Underwear - Performed by patient: Thread/unthread right underwear leg, Thread/unthread left underwear leg, Pull underwear up/down Underwear - Performed by helper: Pull underwear up/down, Thread/unthread right underwear leg Pants- Performed by patient: Thread/unthread right pants leg, Thread/unthread left pants leg, Pull pants up/down, Fasten/unfasten pants Pants- Performed by helper: Thread/unthread left pants leg, Pull pants up/down Non-skid slipper socks- Performed by patient: Don/doff right sock,  Don/doff left sock Non-skid slipper socks- Performed by helper: Don/doff right sock Socks - Performed by patient: Don/doff right sock, Don/doff left sock Socks - Performed by helper: Don/doff left  sock Shoes - Performed by patient: Don/doff right shoe, Don/doff left shoe, Fasten right, Fasten left Shoes - Performed by helper: Fasten right, Fasten left          Lower body assist Assist for lower body dressing: Supervision or verbal cues      Toileting Toileting   Toileting steps completed by patient: Adjust clothing prior to toileting, Performs perineal hygiene, Adjust clothing after toileting Toileting steps completed by helper: Adjust clothing prior to toileting, Adjust clothing after toileting Toileting Assistive Devices: Grab bar or rail  Toileting assist Assist level: No help/no cues   Transfers Chair/bed transfer   Chair/bed transfer method: Ambulatory Chair/bed transfer assist level: No Help, no cues, assistive device, takes more than a reasonable amount of time Chair/bed transfer assistive device: Patent attorneyWalker     Locomotion Ambulation     Max distance: 200 Assist level: No help, No cues, assistive device, takes more than a reasonable amount of time   Wheelchair   Type: Manual Max wheelchair distance: >1000 Assist Level: No help, No cues, assistive device, takes more than reasonable amount of time  Cognition Comprehension Comprehension assist level: Understands complex 90% of the time/cues 10% of the time  Expression Expression assist level: Expresses complex 90% of the time/cues < 10% of the time  Social Interaction Social Interaction assist level: Interacts appropriately 90% of the time - Needs monitoring or encouragement for participation or interaction.  Problem Solving Problem solving assist level: Solves complex 90% of the time/cues < 10% of the time  Memory Memory assist level: Requires cues to use assistive device    Medical Problem List and Plan: 1. Functional and mobility deficits secondary to Guillain Barre Syndrome  Cont CIR PT, OT with planned D/C for 6/12  2. DVT Prophylaxis/Anticoagulation: Pharmaceutical: Lovenox 3. Pain Management: on Neurontin  effective as symptoms improving.  4. Mood: LCSW to follow for evaluation and support.  5. Neuropsych: This patient is capable of making decisions on his own behalf. 6. Skin/Wound Care: Routine pressure relief measures.  7. Fluids/Electrolytes/Nutrition: Encourage po fluid intake.  8. T2DM with peripheral neuropathy:  --Hgb A1c-7.4.   -sugars trending down/ 69 this am  On lantus 50 units---decrease to 40 units (already received the 50 u today)  Meal coverage tid decreased to 5u on 6/5---may need further adjustment  Will cont to adjust depending on trend---sugar control reasonable at present   CBG (last 3)   Recent Labs  10/07/15 2058 10/08/15 0644 10/08/15 0657  GLUCAP 113* 69 74    9. Histamine excess:   Monitor for recurrence of angioedema.   Continue to use benadryl/prednisone prn 10. Normocytic anemia:   Added iron supplement  Hb 9.5 on 6/6, previously ~10.4 , stable 6/7 at 9.3   11. HTN: Monitor BP and adjust medications as indicated. , Orthostatic hypotension improved  Continue metoprolol bid, decreased to 75 on 6/1  Norvasc decreased to 5mg .   Filed Vitals:   10/08/15 0518 10/08/15 0823  BP: 135/64 139/62  Pulse: 74 77  Temp: 97.8 F (36.6 C)   Resp: 18    12. Acute on chronic renal failure:   Encourage po fluids.  Cr. 1.57 on 6/6, slowly improving BMP Latest Ref Rng 10/03/2015 09/29/2015 09/27/2015  Glucose 65 - 99 mg/dL 161(W111(H) 960(A123(H) 540(J181(H)  BUN 6 -  20 mg/dL 18 40(J) 81(X)  Creatinine 0.61 - 1.24 mg/dL 9.14(N) 8.29(F) 6.21(H)  Sodium 135 - 145 mmol/L 141 138 138  Potassium 3.5 - 5.1 mmol/L 3.7 3.7 3.7  Chloride 101 - 111 mmol/L 107 108 110  CO2 22 - 32 mmol/L 24 24 19(L)  Calcium 8.9 - 10.3 mg/dL 9.3 9.0 9.0    13. Diarrhea/Constipation:   Miralax to prn---had soft formed stool friday 14. Thrombocytopenia  Plts 122 on 6/7 stable  LOS (Days) 12 A FACE TO FACE EVALUATION WAS PERFORMED  Jalene Demo T 10/08/2015 8:38 AM

## 2015-10-08 NOTE — Progress Notes (Signed)
Occupational Therapy Discharge Summary  Patient Details  Name: Tanner Mason MRN: [id number] Date of Birth: 01-06-1941   Patient has met 9 of 9 long term goals due to improved activity tolerance, improved balance, postural control, ability to compensate for deficits, functional use of  RIGHT upper and LEFT upper extremity and improved coordination.  Patient to discharge at overall Modified Independent level.  Patient's care partner is independent to provide the necessary physical assistance at discharge.    Reasons goals not met: n/a  Recommendation:  Patient will benefit from ongoing skilled OT services in outpatient setting to continue to advance functional skills in the area of iADL.  Equipment: shower seat  Reasons for discharge: treatment goals met  Patient/family agrees with progress made and goals achieved: Yes  OT Discharge Precautions/Restrictions  Precautions Precautions: Fall Restrictions Weight Bearing Restrictions: No    Vital Signs Therapy Vitals Pulse Rate: 77 BP: 139/62 mmHg Pain Pain Assessment Pain Assessment: 0-10 Pain Score: 5  Pain Location: Back Pain Intervention(s): Repositioned;Ambulation/increased activity (premedicated per pt) ADL  mod I with BADLs Vision/Perception  Vision- History Patient Visual Report: No change from baseline Vision- Assessment Vision Assessment?: No apparent visual deficits  Cognition Overall Cognitive Status: Within Functional Limits for tasks assessed Sensation Sensation Light Touch: Appears Intact (continues to have tingling senstation) Stereognosis: Appears Intact Hot/Cold: Appears Intact Proprioception: Appears Intact Additional Comments: Pt reports mild tingling in hands and knees down to feet Coordination Gross Motor Movements are Fluid and Coordinated: No Fine Motor Movements are Fluid and Coordinated: Yes Motor  Motor Motor - Discharge Observations: BLE weakness Mobility    mod I with toilet and  shower transfers Trunk/Postural Assessment  Cervical Assessment Cervical Assessment: Within Functional Limits Thoracic Assessment Thoracic Assessment: Within Functional Limits Lumbar Assessment Lumbar Assessment: Within Functional Limits Postural Control Postural Control: Within Functional Limits  Balance Static Sitting Balance Static Sitting - Level of Assistance: 7: Independent Static Standing Balance Static Standing - Level of Assistance: 6: Modified independent (Device/Increase time) Extremity/Trunk Assessment RUE Assessment RUE Assessment: Within Functional Limits (grasp strength 40 lbs) LUE Assessment LUE Assessment: Within Functional Limits (grasp strength 50 lbs)   See Function Navigator for Current Functional Status.  [place] 10/08/2015, 10:20 AM

## 2015-10-08 NOTE — Progress Notes (Signed)
Occupational Therapy Session Note  Patient Details  Name: Tanner Mason MRN: [id number] Date of Birth: 1940/06/06  Today's Date: 10/08/2015 OT Individual Time: 0800-0900 OT Individual Time Calculation (min): 60 min    Short Term Goals: Week 1:  OT Short Term Goal 1 (Week 1): Pt will complete squat pivot to toilet with min assist OT Short Term Goal 1 - Progress (Week 1): Met OT Short Term Goal 2 (Week 1): Pt will complete LB dressing with mod assist at sit > stand level OT Short Term Goal 2 - Progress (Week 1): Met OT Short Term Goal 3 (Week 1): Pt will complete bathing at sit > stand level with min assist OT Short Term Goal 3 - Progress (Week 1): Met OT Short Term Goal 4 (Week 1): Pt will complete 1 grooming activity in standing with min assist for standing balance. OT Short Term Goal 4 - Progress (Week 1): Met Week 2:  OT Short Term Goal 1 (Week 2): STGs=LTGs secondary to upcoming discharge     Skilled Therapeutic Interventions/Progress Updates:    Pt seen for ADLs to ensure pt is at a mod I level prior to discharge tomorrow am. Pt able to ambulate around room with 4WW with mod I to gather clothing and supplies. He was then able to ambulate to bathroom 2x, once to shower and once to toilet. Pt completed all ADLs with mod I. Reviewed home exercises for pt to do. 2 exercises with close S from spouse: sit to stand,  And standing balance at sink.  Pt also needs to continue to work with theraputty. Pt in room with all needs met ready for discharge tomorrow.  Therapy Documentation Precautions:  Precautions Precautions: Fall Precaution Comments: BLE knee buckling Restrictions Weight Bearing Restrictions: No   Vital Signs: Therapy Vitals Pulse Rate: 77 BP: 139/62 mmHg Pain: Pain Assessment Pain Assessment: 0-10 Pain Score: 5  Pain Location: Back Pain Intervention(s): Repositioned;Ambulation/increased activity (premedicated per pt) ADL:  See Function Navigator for Current  Functional Status.   Therapy/Group: Individual Therapy  Lovena Kluck 10/08/2015, 10:24 AM

## 2015-10-09 LAB — GLUCOSE, CAPILLARY: Glucose-Capillary: 108 mg/dL — ABNORMAL HIGH (ref 65–99)

## 2015-10-09 MED ORDER — METOPROLOL TARTRATE 100 MG PO TABS: 100.0000 mg | ORAL_TABLET | Freq: Two times a day (BID) | ORAL | Status: DC

## 2015-10-09 MED ORDER — SENNOSIDES-DOCUSATE SODIUM 8.6-50 MG PO TABS: 2.0000 | ORAL_TABLET | Freq: Two times a day (BID) | ORAL | Status: DC

## 2015-10-09 MED ORDER — SIMETHICONE 80 MG PO CHEW: 80.0000 mg | CHEWABLE_TABLET | Freq: Three times a day (TID) | ORAL | Status: DC

## 2015-10-09 MED ORDER — POTASSIUM CHLORIDE CRYS ER 10 MEQ PO TBCR: 10.0000 meq | EXTENDED_RELEASE_TABLET | Freq: Every day | ORAL | Status: DC

## 2015-10-09 MED ORDER — METOPROLOL TARTRATE 75 MG PO TABS: 75.0000 mg | ORAL_TABLET | Freq: Two times a day (BID) | ORAL | Status: DC

## 2015-10-09 MED ORDER — POLYSACCHARIDE IRON COMPLEX 150 MG PO CAPS: 150.0000 mg | ORAL_CAPSULE | Freq: Two times a day (BID) | ORAL | Status: DC

## 2015-10-09 MED ORDER — INSULIN GLARGINE 100 UNIT/ML SOLOSTAR PEN: 40.0000 [IU] | PEN_INJECTOR | Freq: Every day | SUBCUTANEOUS | Status: DC

## 2015-10-09 MED ORDER — INSULIN LISPRO 100 UNIT/ML (KWIKPEN): PEN_INJECTOR | SUBCUTANEOUS | Status: DC

## 2015-10-09 MED ORDER — AMLODIPINE BESYLATE 5 MG PO TABS: 5.0000 mg | ORAL_TABLET | Freq: Every day | ORAL | Status: DC

## 2015-10-09 MED ORDER — TRAMADOL HCL 50 MG PO TABS
50.0000 mg | ORAL_TABLET | Freq: Four times a day (QID) | ORAL | Status: DC | PRN
Start: 2015-10-09 — End: 2015-11-23

## 2015-10-09 MED ORDER — GABAPENTIN 600 MG PO TABS: 600.0000 mg | ORAL_TABLET | Freq: Two times a day (BID) | ORAL | Status: DC

## 2015-10-09 NOTE — Progress Notes (Signed)
[place] PHYSICAL MEDICINE & REHABILITATION     PROGRESS NOTE  Subjective/Complaints:  Pt sitting up in his bed, ready for discharge.    ROS:  Denies CP, SOB, nausea, vomiting, diarrhea.  Objective: Vital Signs: Blood pressure 117/68, pulse 84, temperature 98.3 F (36.8 C), temperature source Oral, resp. rate 17, height  (1.702 m), weight 68.811 kg (151 lb 11.2 oz), SpO2 99 %. No results found. No results for input(s): WBC, HGB, HCT, PLT in the last 72 hours. No results for input(s): NA, K, CL, GLUCOSE, BUN, CREATININE, CALCIUM in the last 72 hours.  Invalid input(s): CO CBG (last 3)   Recent Labs  10/08/15 1644 10/08/15 2124 10/09/15 0654  GLUCAP 65 167* 108*    Wt Readings from Last 3 Encounters:  10/04/15 68.811 kg (151 lb 11.2 oz)  09/23/15 70.7 kg (155 lb 13.8 oz)  07/14/15 76.204 kg (168 lb)    Physical Exam:  BP 117/68 mmHg  Pulse 84  Temp(Src) 98.3 F (36.8 C) (Oral)  Resp 17  Ht  (1.702 m)  Wt 68.811 kg (151 lb 11.2 oz)  BMI 23.75 kg/m2  SpO2 99% Constitutional: He appears well-developed and well-nourished. No distress.  HENT: Normocephalic and atraumatic.   Eyes: Conjunctivae and EOM are normal. No scleral icterus.  Cardiovascular: Normal rate and regular rhythm.  Respiratory: Effort normal and breath sounds normal. No respiratory distress. He exhibits no tenderness.  GI: Soft. Bowel sounds are normal. He exhibits no distension. There is no tenderness.  Musculoskeletal: He exhibits no edema or tenderness.  Neurological: He is alert and oriented.  Speech clear.  Mild ptosis on left--chronic.  Motor: 4+/5 bilateral deltoids, biceps, triceps, grip 4+/5 hip flexors 4+/5 knee extensor, 4+/5 ankle dorsiflexor  Skin: Skin is warm and dry. He is not diaphoretic.  Psychiatric: He has a normal mood and affect. His behavior is normal. Judgment and thought content normal  Assessment/Plan: 1. Functional deficits secondary to Guillain Barre  Syndrome which require 3+ hours per day of interdisciplinary therapy in a comprehensive inpatient rehab setting. Physiatrist is providing close team supervision and 24 hour management of active medical problems listed below. Physiatrist and rehab team continue to assess barriers to discharge/monitor patient progress toward functional and medical goals.  Function:  Bathing Bathing position   Position: Shower  Bathing parts Body parts bathed by patient: Right arm, Left arm, Chest, Abdomen, Front perineal area, Right upper leg, Left upper leg, Right lower leg, Left lower leg, Buttocks Body parts bathed by helper: Back  Bathing assist Assist Level: More than reasonable time      Upper Body Dressing/Undressing Upper body dressing   What is the patient wearing?: Pull over shirt/dress     Pull over shirt/dress - Perfomed by patient: Thread/unthread right sleeve, Thread/unthread left sleeve, Put head through opening, Pull shirt over trunk          Upper body assist Assist Level: More than reasonable time   Set up : To obtain clothing/put away  Lower Body Dressing/Undressing Lower body dressing   What is the patient wearing?: Underwear, Pants, Socks, Shoes Underwear - Performed by patient: Thread/unthread right underwear leg, Thread/unthread left underwear leg, Pull underwear up/down Underwear - Performed by helper: Pull underwear up/down, Thread/unthread right underwear leg Pants- Performed by patient: Thread/unthread right pants leg, Thread/unthread left pants leg, Pull pants up/down, Fasten/unfasten pants Pants- Performed by helper: Thread/unthread left pants leg, Pull pants up/down Non-skid slipper socks- Performed by patient: Don/doff right sock, Don/doff  left sock Non-skid slipper socks- Performed by helper: Don/doff right sock Socks - Performed by patient: Don/doff right sock, Don/doff left sock Socks - Performed by helper: Don/doff left sock Shoes - Performed by patient: Don/doff  right shoe, Don/doff left shoe, Fasten right, Fasten left Shoes - Performed by helper: Fasten right, Fasten left          Lower body assist Assist for lower body dressing: More than reasonable time      Toileting Toileting   Toileting steps completed by patient: Adjust clothing prior to toileting, Performs perineal hygiene, Adjust clothing after toileting Toileting steps completed by helper: Adjust clothing prior to toileting, Adjust clothing after toileting Toileting Assistive Devices: Grab bar or rail  Toileting assist Assist level: No help/no cues   Transfers Chair/bed transfer   Chair/bed transfer method: Ambulatory Chair/bed transfer assist level: No Help, no cues, assistive device, takes more than a reasonable amount of time Chair/bed transfer assistive device: Environmental consultant (rollator)     Locomotion Ambulation     Max distance: 1000 ft Assist level: No help, No cues, assistive device, takes more than a reasonable amount of time   Wheelchair Wheelchair activity did not occur: N/A Type: Manual Max wheelchair distance: >1000 Assist Level: No help, No cues, assistive device, takes more than reasonable amount of time  Cognition Comprehension Comprehension assist level: Follows complex conversation/direction with no assist  Expression Expression assist level: Expresses complex ideas: With extra time/assistive device  Social Interaction Social Interaction assist level: Interacts appropriately with others with medication or extra time (anti-anxiety, antidepressant).  Problem Solving Problem solving assist level: Solves complex problems: With extra time  Memory Memory assist level: More than reasonable amount of time    Medical Problem List and Plan: 1. Functional and mobility deficits secondary to Guillain Barre Syndrome  D/c today  Will see pt for transitional care management in 1-2 weeks 2. DVT Prophylaxis/Anticoagulation: Pharmaceutical: Lovenox 3. Pain Management: on  Neurontin effective as symptoms improving.  4. Mood: LCSW to follow for evaluation and support.  5. Neuropsych: This patient is capable of making decisions on his own behalf. 6. Skin/Wound Care: Routine pressure relief measures.  7. Fluids/Electrolytes/Nutrition: Encourage po fluid intake.  8. T2DM with peripheral neuropathy:  --Hgb A1c-7.4.   On lantus 50 units---decreased to 40 units  Meal coverage tid decreased to 5u on 6/5---may need further adjustment  Will cont to adjust depending on trend---sugar control reasonable at present  CBG (last 3)   Recent Labs  10/08/15 1644 10/08/15 2124 10/09/15 0654  GLUCAP 65 167* 108*  9. Histamine excess:   Monitor for recurrence of angioedema.   Continue to use benadryl/prednisone prn 10. Normocytic anemia:   Added iron supplement  Stable 6/7 at 9.3 11. HTN: Monitor BP and adjust medications as indicated. , Orthostatic hypotension improved  Continue metoprolol bid, decreased to 75 on 6/1  Norvasc decreased to 5mg . Filed Vitals:   10/09/15 0616 10/09/15 0811  BP:  117/68  Pulse:  84  Temp: 98.3 F (36.8 C)   Resp: 17   12. Acute on chronic renal failure:   Encourage po fluids.  Cr. 1.57 on 6/6, slowly improving BMP Latest Ref Rng 10/03/2015 09/29/2015 09/27/2015  Glucose 65 - 99 mg/dL 161(W) 960(A) 540(J)  BUN 6 - 20 mg/dL 18 81(X) 91(Y)  Creatinine 0.61 - 1.24 mg/dL 7.82(N) 5.62(Z) 3.08(M)  Sodium 135 - 145 mmol/L 141 138 138  Potassium 3.5 - 5.1 mmol/L 3.7 3.7 3.7  Chloride 101 - 111  mmol/L 107 108 110  CO2 22 - 32 mmol/L 24 24 19(L)  Calcium 8.9 - 10.3 mg/dL 9.3 9.0 9.0  13. Diarrhea/Constipation:   Miralax to prn 14. Thrombocytopenia  Plts 122 on 6/7 stable  LOS (Days) 13 A FACE TO FACE EVALUATION WAS PERFORMED  Ankit Karis Jubanil Patel 10/09/2015 9:25 AM

## 2015-10-09 NOTE — Discharge Summary (Signed)
Physician Discharge Summary  Patient ID: Tanner Mason MRN: [id number] DOB/AGE: 1940/06/06 75 y.o.  Admit date: 09/26/2015 Discharge date: 10/09/2015  Discharge Diagnoses:  Principal Problem:   Guillain Barr syndrome [facility]) Active Problems:   Constipation   Thrombocytopenia (HCC)   Acute on chronic renal insufficiency (HCC)   Normocytic anemia   Type 2 diabetes mellitus with peripheral neuropathy (HCC)   Irregular bowel habits   Benign essential HTN   Discharged Condition: Stable.   Significant Diagnostic Studies:  Dg Abd 1 View  09/27/2015  CLINICAL DATA:  Constipation x 2 days, hypogastric pain. No nausea, no pain. No recent surgery Hx of esophagogastroduodenoscopy EXAM: ABDOMEN - 1 VIEW COMPARISON:  CT, 07/03/2007 FINDINGS: Normal bowel gas pattern.  No increase in colonic stool. No evidence of renal or ureteral stones. Soft tissues are unremarkable. There are old healed rib fractures on the right. IMPRESSION: 1. No acute findings.  No increase in colonic stool. Electronically Signed   By: Amie Portland M.D.   On: 09/27/2015 09:06    Labs:  Basic Metabolic Panel: BMP Latest Ref Rng 10/03/2015 09/29/2015 09/27/2015  Glucose 65 - 99 mg/dL 132(G) 401(U) 272(Z)  BUN 6 - 20 mg/dL 18 36(U) 44(I)  Creatinine 0.61 - 1.24 mg/dL 3.47(Q) 2.59(D) 6.38(V)  Sodium 135 - 145 mmol/L 141 138 138  Potassium 3.5 - 5.1 mmol/L 3.7 3.7 3.7  Chloride 101 - 111 mmol/L 107 108 110  CO2 22 - 32 mmol/L 24 24 19(L)  Calcium 8.9 - 10.3 mg/dL 9.3 9.0 9.0     CBC: CBC Latest Ref Rng 10/04/2015 10/03/2015 09/28/2015  WBC 4.0 - 10.5 K/uL 3.9(L) 4.2 4.7  Hemoglobin 13.0 - 17.0 g/dL 5.6(E) 3.3(I) 10.4(L)  Hematocrit 39.0 - 52.0 % 28.2(L) 28.7(L) 31.0(L)  Platelets 150 - 400 K/uL 122(L) 142(L) 125(L)     CBG:  Recent Labs Lab 10/08/15 0657 10/08/15 1140 10/08/15 1644 10/08/15 2124 10/09/15 0654  GLUCAP 74 103* 65 167* 108*    Today's Vitals   10/08/15 2049 10/09/15 0616 10/09/15 0740 10/09/15  0811  BP:    117/68  Pulse:    84  Temp:  98.3 F (36.8 C)    TempSrc:  Oral    Resp:  17    Height:      Weight:      SpO2:      PainSc: 0-No pain  0-No pain     Brief HPI:   Tanner Mason is a 75 y.o. male with history of DMT2 with neuropathy and gait disorder who was admitted on 09/17/15 with numbness of feet with worsening balance and progressive weakness. Had been seen in the ED few days before with negative work up but continued to worsen and was found to be areflexic with work up consistent with GBS. MRI lumbar spine showed mild to moderate stenosis L4/5 and L5/S1. LP reaveled elevated glucose, elevated protein and elevated IgG. CSF cultures have been negative. He was started on PLEX with improvement in strength with ability to stand. Transient recurrence of angioedema resolved with useof prednisone and benadryl. Therapy was initiated and CIR was recommended by rehab team.   Hospital Course: Tanner Mason was admitted to rehab 09/26/2015 for inpatient therapies to consist of PT and OT at least three hours five days a week. Past admission physiatrist, therapy team and rehab RN have worked together to provide customized collaborative inpatient rehab. He reported dizziness with activity and was noted to be orthostatic. Blood pressure medications have  been titrated down to Norvasc 5 mg and metoprolol 75 mg bid. His flomax was changed to evenings to avoid SE and dizziness has resolved. Neuropathy BLE has been controlled on gabapentin bid.  Po intake has been good and he is continent of bowel and bladder. He continued to report issues with constipation and bloating and KUB done showed no evidence of ileus or obstipation. Bowel program was titrated to 2 Senna S bid and simethicone was added tid ac to help manage bloating. Diabetes has been monitored on ac/hs basis and he has had multiple hypoglycemic episodes requiring frequent adjustment in regimen. Meal coverage has been titrated down to   5 units tid ac and lantus was reduced to 40 units daily. He has been educated on importance of  dietary compliance at home and instructed to monitor BS ac/hs after discharge.   Follow up labs shows anemia and iron supplement was added.  Thrombocytopenia has been monitored closely and has improved from 90's to 122. Renal status has been monitored with routine check of lytes and Cr has stabilized to 1.57. He has had reports of back pain and this has been managed with prn use of tramadol.  He has shown improvement in activity tolerance as well as BLE strength and stability. He is currently modified independent for mobility and ADL tasks with use of rollater. He continues to be at high risk for fall due to balance deficits with BERG balance score 35/56.  He will continue to receive follow up [facility] and [facility] after discharge.   Rehab course: During patient's stay in rehab weekly team conferences were held to monitor patient's progress, set goals and discuss barriers to discharge. At admission, patient required max assist with mobility and basic self care tasks. He has had improvement in activity tolerance, balance, postural control, as well as ability to compensate for deficits. He is has had improvement in functional use BLE. He is able to complete ADL tasks at modified independent level. He is ambulating 200' at modified independent level with rollater. He requires supervision to navigate stairs and for car transfers. Family education was done with wife who will assist as needed after discharge.    Disposition: 01-Home or Self Care  Diet: diabetic diet.   Special Instructions: 1. Check tid ac/hs. 2. Needs CBC and CMET checked in 7-10 days.  3. No driving till cleared by MD. 4. Outpatient PT starts 6/15 at 8:15 am and OT begins 6/19 at 10:15 am.    Discharge Instructions    Ambulatory referral to Physical Medicine Rehab    Complete by:  As directed   1-2 week follow up/GBS             Medication List    STOP taking these medications        hydrochlorothiazide 25 MG tablet  Commonly known as:  HYDRODIURIL     polyethylene glycol packet  Commonly known as:  MIRALAX / GLYCOLAX      TAKE these medications        amLODipine 5 MG tablet  Commonly known as:  NORVASC  Take 1 tablet (5 mg total) by mouth daily.     atorvastatin 40 MG tablet  Commonly known as:  LIPITOR  TAKE ONE TABLET BY MOUTH ONE TIME DAILY     BAYER CHILDRENS ASPIRIN 81 MG chewable tablet  Generic drug:  aspirin  Chew 81 mg by mouth daily.     BD PEN NEEDLE NANO U/F  32G X 4 MM Misc  Generic drug:  Insulin Pen Needle  USE AS INSTRUCTED BY YOUR PHYSICIAN TO CHECK BLOOD SUGAR     cetirizine 10 MG tablet  Commonly known as:  ZYRTEC  Take 10 mg by mouth every evening.     diphenhydrAMINE 12.5 MG/5ML elixir  Commonly known as:  BENADRYL  Take 25 mg by mouth 4 (four) times daily as needed for allergies.     EPIPEN 2-PAK 0.3 mg/0.3 mL Soaj injection  Generic drug:  EPINEPHrine  INJECT 0.3MLS INTO THE MUSLE ONCE     finasteride 5 MG tablet  Commonly known as:  PROSCAR  Take 1 tablet (5 mg total) by mouth daily.     gabapentin 600 MG tablet  Commonly known as:  NEURONTIN  Take 1 tablet (600 mg total) by mouth 2 (two) times daily.     Insulin Glargine 100 UNIT/ML Solostar Pen  Commonly known as:  LANTUS SOLOSTAR  Inject 40 Units into the skin daily after breakfast.     insulin lispro 100 UNIT/ML KiwkPen  Commonly known as:  HUMALOG  Decrease to    5 units three times a day with meals.     iron polysaccharides 150 MG capsule  Commonly known as:  NIFEREX  Take 1 capsule (150 mg total) by mouth 2 (two) times daily before lunch and supper.     Metoprolol Tartrate 75 MG Tabs  Take 75 mg by mouth 2 (two) times daily.     omeprazole 20 MG capsule  Commonly known as:  PRILOSEC  TAKE ONE CAPSULE BY MOUTH ONE TIME DAILY     ONE TOUCH ULTRA TEST test strip  Generic drug:   glucose blood  USE TO CHECK BLOOD SUGAR THREE TIMES DAILY     potassium chloride 10 MEQ tablet  Commonly known as:  K-DUR,KLOR-CON  Take 1 tablet (10 mEq total) by mouth daily.     senna-docusate 8.6-50 MG tablet  Commonly known as:  Senokot-S  Take 2 tablets by mouth 2 (two) times daily.     simethicone 80 MG chewable tablet  Commonly known as:  MYLICON  Chew 1 tablet (80 mg total) by mouth 3 (three) times daily before meals.     tamsulosin 0.4 MG Caps capsule--Rx#60 pills  Commonly known as:  FLOMAX  TAKE ONE CAPSULE BY MOUTH ONE TIME DAILY     traMADol 50 MG tablet--RX # 60 pills.   Commonly known as:  ULTRAM  Take 1 tablet (50 mg total) by mouth every 6 (six) hours as needed for severe pain.     VICTOZA 18 MG/3ML Sopn  Generic drug:  Liraglutide  INJECT 1.8MG  INTO THE SKIN DAILY       Follow-up Information    Follow up with Shawnika Pepin Karis Juba, MD.   Specialty:  Physical Medicine and Rehabilitation   Why:  office will call you with follow up appointment--NEW address: [address] [place], [place] [address] Tel# [phone]   Contact information:   [address] [place] [address] [phone]       Follow up with [facility].   Contact information:   [address] [place] [address] [phone]      Follow up with Uvaldo Rising, MD On 10/26/2015.   Specialty:  Family Medicine   Why:  11 AM   Contact information:   [address] [place] [address] [phone]  SignedJacquelynn Cree 10/09/2015, 4:58 PM

## 2015-10-09 NOTE — Discharge Instructions (Signed)
Inpatient Rehab Discharge Instructions  Tanner RueCharles A Mason Discharge date and time:  10/09/15  Activities/Precautions/ Functional Status: Activity: activity as tolerated NO Driving till cleared by MD.  Diet: cardiac diet and diabetic diet Wound Care: none needed    Functional status:  ___ No restrictions     ___ Walk up steps independently ___ 24/7 supervision/assistance   ___ Walk up steps with assistance _X__ Intermittent supervision/assistance  _X__ Bathe/dress independently ___ Walk with walker     ___ Bathe/dress with assistance ___ Walk Independently    ___ Shower independently ___ Walk with assistance    ___ Shower with assistance _X__ No alcohol     ___ Return to work/school ________   COMMUNITY REFERRALS UPON DISCHARGE:   Outpatient: PT      OT  Agency:  [facility] Phone:  [phone]) [phone]             Appointment Date/Time:  10-12-15 PT at 8:45am with 8:15am arrival time                                                      10-16-15 OT at 10:15am Medical Equipment/Items Ordered:  18"x18" lightweight w/c with basic cushion; rollator; bedside commode; and tub seat with back  Agency/Supplier:  [facility]                Phone:  [phone](336) [phone]   Special Instructions: 1. Check blood sugars before meals and at bedtime. If your blood sugars start running over 100 increase your meal coverage by 5 units.     My questions have been answered and I understand these instructions. I will adhere to these goals and the provided educational materials after my discharge from the hospital.  Patient/Caregiver Signature _______________________________ Date __________  Clinician Signature _______________________________________ Date __________  Please bring this form and your medication list with you to all your follow-up doctor's appointments.

## 2015-10-11 ENCOUNTER — Telehealth: Payer: Self-pay | Admitting: Family Medicine

## 2015-10-11 NOTE — Telephone Encounter (Signed)
1. Are you/is patient experiencing any problems since coming home?  No Are there any questions regarding any aspect of care? NO 2. Are there any questions regarding medications administration/dosing? NO Are meds being taken as prescribed? Yes Patient should review meds with caller to confirm  3. Have there been any falls? NO 4. Has Home Health been to the house and/or have they contacted you? He will go out to therapy no home health     If not, have you tried to contact them? NO Can we help you contact them? NO 5. Are bowels and bladder emptying properly? YES  Are there any unexpected incontinence issues? NO If applicable, is patient following bowel/bladder programs?  6. Any fevers, problems with breathing, unexpected pain? NO 7. Are there any skin problems or new areas of breakdown? NO 8. Has the patient/family member arranged specialty MD follow up (ie cardiology/neurology/renal/surgical/etc)? Can we help arrange? No 9. Does the patient need any other services or support that we can help arrange? No 10. Are caregivers following through as expected in assisting the patient? YES 11. Has the patient quit smoking, drinking alcohol, or using drugs as recommended? Not smoking or drinking or using drugs  Appointment 10/19/15 @ 1:30  Patient confirmed the appointment time and date.

## 2015-10-12 ENCOUNTER — Ambulatory Visit: Payer: Medicare Other | Attending: Physical Medicine & Rehabilitation | Admitting: Rehabilitation

## 2015-10-12 ENCOUNTER — Encounter: Payer: Self-pay | Admitting: Rehabilitation

## 2015-10-12 ENCOUNTER — Ambulatory Visit: Payer: Medicare Other | Admitting: Rehabilitation

## 2015-10-12 DIAGNOSIS — M6281 Muscle weakness (generalized): Secondary | ICD-10-CM | POA: Insufficient documentation

## 2015-10-12 DIAGNOSIS — G610 Guillain-Barre syndrome: Secondary | ICD-10-CM | POA: Diagnosis present

## 2015-10-12 DIAGNOSIS — R208 Other disturbances of skin sensation: Secondary | ICD-10-CM | POA: Diagnosis present

## 2015-10-12 DIAGNOSIS — R2689 Other abnormalities of gait and mobility: Secondary | ICD-10-CM | POA: Insufficient documentation

## 2015-10-12 DIAGNOSIS — R2681 Unsteadiness on feet: Secondary | ICD-10-CM | POA: Insufficient documentation

## 2015-10-12 DIAGNOSIS — R278 Other lack of coordination: Secondary | ICD-10-CM | POA: Insufficient documentation

## 2015-10-12 NOTE — Therapy (Signed)
[facility] [address] [place], [place], [address] Phone: [phone]   Fax:  [phone]  Physical Therapy Evaluation  Patient Details  Name: Tanner Mason MRN: [id number] Date of Birth: 1940/09/22 Referring Provider: Maryla Morrow, MD  Encounter Date: 10/12/2015      PT End of Session - 10/12/15 1138    Visit Number 1   Number of Visits 17   Date for PT Re-Evaluation 12/11/15   Authorization Type UHC MDC-G Code on every 10th visit   PT Start Time 0845   PT Stop Time 0931   PT Time Calculation (min) 46 min   Activity Tolerance Patient tolerated treatment well   Behavior During Therapy [facility] for tasks assessed/performed      Past Medical History  Diagnosis Date  . Elbow dislocation 6962,9528    Right  . Treadmill stress test negative for angina pectoris 06/2002    poor HR and BP recovery  . Abdominal ultrasound, abnormal 02/2004    fatty liver  . Encounter for diagnostic endoscopy 10/22/04    negative  . History of esophagogastroduodenoscopy 08/13/2007    normal  . Diabetes mellitus without complication [facility])     Past Surgical History  Procedure Laterality Date  . Inguinal hernia repair Right 1947  . Anterior cruciate ligament repair  5/98    Left, staph infection complicated  . Blepharoplasty  03/2005    with ptosis repair  . Basal cell carcinoma excision  02/2005    R ear  . Inguinal hernia repair Left 1970    There were no vitals filed for this visit.       Subjective Assessment - 10/12/15 0848    Subjective "Well I had that (GBS) and I just got out of hospital so this is my follow up therapy for that."    Patient is accompained by: Family member  [name], wife   Pertinent History DM with peripheral neruropathy, hx of LBP w/ radiculopathy   Limitations Walking;House hold activities;Standing  standing or walking for long periods of time   Patient Stated Goals To walk again normally and [place]    Currently in Pain? Yes   Pain Score 2    Pain Location Back   Pain Orientation Lower   Pain Descriptors / Indicators Aching   Pain Type Acute pain;Chronic pain   Pain Onset More than a month ago  some acute since hospital stay, but some chronic   Pain Frequency Intermittent   Aggravating Factors  malpositioning, sleeping   Pain Relieving Factors medication            [facility] PT Assessment - 10/12/15 0852    Assessment   Medical Diagnosis GBS   Referring Provider Maryla Morrow, MD   Onset Date/Surgical Date 09/17/15   Prior Therapy acute, CIR   Precautions   Precautions Fall   Restrictions   Weight Bearing Restrictions No   Balance Screen   Has the patient fallen in the past 6 months No   Has the patient had a decrease in activity level because of a fear of falling?  Yes   Is the patient reluctant to leave their home because of a fear of falling?  No   Home Environment   Living Environment Private residence   Living Arrangements Spouse/significant other   Available Help at Discharge Family;Available 24 hours/day   Type of Home House   Home Access Stairs to enter  Entrance Stairs-Number of Steps 2 then 2   Entrance Stairs-Rails None   Home Layout One level   Home Equipment Walker - 4 wheels;Wheelchair - manual;Bedside commode;Hand held shower head;Shower seat;Grab bars - tub/shower  walk in shower   Prior Function   Level of Independence Requires assistive device for independence   Vocation Retired   [employer] retired Runner, broadcasting/film/video   Leisure likes to go to [employer], boating, did go to the [employer] for swimming   Cognition   Overall Cognitive Status Within Functional Limits for tasks assessed   Observation/Other Assessments   Focus on Therapeutic Outcomes (FOTO)  Physical Fear:26   Sensation   Light Touch Impaired Detail   Light Touch Impaired Details Impaired RLE;Impaired LLE;Impaired RUE;Impaired LUE  absent in R 5th digit    Hot/Cold Appears Intact   Proprioception Impaired by gross assessment   Additional Comments pt states that he sometimes steps on other foot when walking   Coordination   Gross Motor Movements are Fluid and Coordinated Yes  in LEs   Fine Motor Movements are Fluid and Coordinated Yes   ROM / Strength   AROM / PROM / Strength Strength   Strength   Overall Strength Deficits   Overall Strength Comments B hip flex 4/5, B knee ext 4/5, B knee flex 4/5, R ankle DF 3+/5, L ankle DF 3-/5, B ankle PF 4/5   Transfers   Transfers Sit to Stand;Stand to Sit   Sit to Stand 5: Supervision   Five time sit to stand comments  18.12 secs without hands   Comments S for safety due to B quad weakness during sit<>stand   Ambulation/Gait   Ambulation/Gait Yes   Ambulation/Gait Assistance 6: Modified independent (Device/Increase time)   Ambulation Distance (Feet) 115 Feet   Assistive device 4-wheeled walker   Gait Pattern Step-through pattern;Decreased stride length;Decreased dorsiflexion - right;Decreased dorsiflexion - left   Ambulation Surface Level;Indoor   Stairs Yes   Stairs Assistance 6: Modified independent (Device/Increase time)   Stair Management Technique One rail Right;Step to pattern;Sideways   Number of Stairs 4   Height of Stairs 6   Standardized Balance Assessment   Standardized Balance Assessment Berg Balance Test   Berg Balance Test   Sit to Stand Able to stand  independently using hands   Standing Unsupported Able to stand 2 minutes with supervision   Sitting with Back Unsupported but Feet Supported on Floor or Stool Able to sit safely and securely 2 minutes   Stand to Sit Sits safely with minimal use of hands   Transfers Able to transfer with verbal cueing and /or supervision   Standing Unsupported with Eyes Closed Needs help to keep from falling   Standing Ubsupported with Feet Together Able to place feet together independently and stand for 1 minute with supervision   From Standing,  Reach Forward with Outstretched Arm Reaches forward but needs supervision   From Standing Position, Pick up Object from Floor Unable to try/needs assist to keep balance   From Standing Position, Turn to Look Behind Over each Shoulder Needs supervision when turning   Turn 360 Degrees Needs close supervision or verbal cueing   Standing Unsupported, Alternately Place Feet on Step/Stool Able to complete >2 steps/needs minimal assist   Standing Unsupported, One Foot in Front Able to plae foot ahead of the other independently and hold 30 seconds   Standing on One Leg Tries to lift leg/unable to hold 3 seconds but remains standing independently  Total Score 27   Berg comment: < 36 high risk for falls (close to 100%)                            PT Education - 10/12/15 1138    Education provided Yes   Education Details educated on PT POC, goals, and BERG balance score results.    Person(s) Educated Patient;Spouse   Methods Explanation   Comprehension Verbalized understanding          PT Short Term Goals - 10/12/15 1148    PT SHORT TERM GOAL #1   Title Pt will initiate HEP for BLE strengthening and balance in order to decrease fall risk and improve functional mobility.  (Target Date: 11/09/15)   Status New   PT SHORT TERM GOAL #2   Title Pt wil improve gait speed to 2.41 ft/sec in order to decrease fall risk and improve efficiency of gait.     Status New   PT SHORT TERM GOAL #3   Title Pt wil improve BERG balance score to 31/56 in order to demonstrate decreased fall risk.     PT SHORT TERM GOAL #4   Title Will assess 6MWT and improve distance by 150' in order to demonstrate improved functional endurance.     Status New   PT SHORT TERM GOAL #5   Title Pt will ambulate >200' over indoor surfaces w/ SPC at S level in order to demonstrate increased independence with gait.             PT Long Term Goals - 10/12/15 1152    PT LONG TERM GOAL #1   Title Pt will be  independent with HEP in order to demonstrate improved functional mobility and decreased fall risk.  (Target Date: 12/07/15)   Status New   PT LONG TERM GOAL #2   Title Pt will improve gait speed to >2.62 ft/sec in order to indicate pt is community ambulator.     Status New   PT LONG TERM GOAL #3   Title Pt will increase BERG balance score to 37/56 in order to indicate decreased fall risk.    Status New   PT LONG TERM GOAL #4   Title Pt will verbalize particpation in walking program/return to community fitness in order to maintain and progress endurance.     Status New   PT LONG TERM GOAL #5   Title Pt will ambulate with SPC over outdoor surfaces (paved and grass) at mod I level in order to demonstrate return to community negotiation.     Additional Long Term Goals   Additional Long Term Goals Yes   PT LONG TERM GOAL #6   Title Pt will ambulate up/down grassy hill with LRAD at S level in order to indicate return to lake house/boat.     Status New               Plan - 10/12/15 1140    Clinical Impression Statement Pt presents with recent diagnosis of Guillian Bairre Syndrome on 09/17/15 with BLE/UE weakness and sensory changes resulting in hospital stay followed by inpatient rehab stay to address deficits.  Upon PT evaluation, note that pt with decreased strength in B hip muscles as well as B ankle DF, sensory changes (distal>proximal), BERG balance score of 27/56 indicative of significant fall risk and gait speed of 1.81 ft/sec indicative of recurrent fall risk (right at cut off).  Pt with history of  LBP with radiculopathy, DM and peripheral neuropathy which could impact therapy progress.  Pt is of evolving presentation and moderate complexity based on PT POC.  Pt will benefit from skilled OP neuro PT in order to address deficits.     Rehab Potential Good   Clinical Impairments Affecting Rehab Potential co-morbidities   PT Frequency 2x / week   PT Duration 8 weeks   PT  Treatment/Interventions ADLs/Self Care Home Management;Electrical Stimulation;DME Instruction;Gait training;Stair training;Functional mobility training;Therapeutic activities;Therapeutic exercise;Balance training;Neuromuscular re-education;Patient/family education;Orthotic Fit/Training;Energy conservation;Vestibular   PT Next Visit Plan est HEP for B LE strength (supine and standing if possible, sit<>stand), 6 MWT, gait with LRAD   Consulted and Agree with Plan of Care Patient;Family member/caregiver   Family Member Consulted wife [name]      Patient will benefit from skilled therapeutic intervention in order to improve the following deficits and impairments:  Abnormal gait, Decreased activity tolerance, Decreased balance, Decreased endurance, Decreased knowledge of precautions, Decreased mobility, Decreased safety awareness, Decreased strength, Difficulty walking, Dizziness, Impaired perceived functional ability, Impaired sensation, Improper body mechanics, Postural dysfunction  Visit Diagnosis: Unsteadiness on feet - Plan: PT plan of care cert/re-cert  Muscle weakness (generalized) - Plan: PT plan of care cert/re-cert  Other abnormalities of gait and mobility - Plan: PT plan of care cert/re-cert  Other disturbances of skin sensation - Plan: PT plan of care cert/re-cert      G-Codes - 10/18/15 1156    Functional Assessment Tool Used BERG: 27/56   Functional Limitation Mobility: Walking and moving around   Mobility: Walking and Moving Around Current Status ([id number]) At least 40 percent but less than 60 percent impaired, limited or restricted   Mobility: Walking and Moving Around Goal Status [phone]) At least 20 percent but less than 40 percent impaired, limited or restricted       Problem List Patient Active Problem List   Diagnosis Date Noted  . Benign essential HTN   . Irregular bowel habits   . Constipation   . Thrombocytopenia (HCC)   . Acute on chronic renal insufficiency (HCC)    . Normocytic anemia   . Type 2 diabetes mellitus with peripheral neuropathy (HCC)   . Bilateral leg weakness   . Idiopathic angioedema   . Leg weakness   . Back pain   . Hypomagnesemia   . Type 2 diabetes mellitus with diabetic neuropathy, unspecified (HCC)   . Guillain Barr syndrome (HCC)   . Weakness 09/17/2015  . Gait abnormality 09/17/2015  . Gait instability 09/17/2015  . Cellulitis and abscess 07/14/2015  . Preventative health care 11/08/2014  . Angio-edema 07/14/2014  . Angioedema 07/13/2014  . Left foot drop 04/05/2014  . Lumbar back pain with radiculopathy affecting left lower extremity 04/05/2014  . Atypical chest pain 12/14/2013  . Hypokalemia 03/16/2013  . Overweight (BMI 25.0-29.9) 09/15/2012  . Stage 3 chronic renal impairment associated with type 2 diabetes mellitus (HCC) 07/20/2010  . ULNAR NERVE ENTRAPMENT, RIGHT 06/07/2008  . Diabetes mellitus (HCC) 12/09/2007  . Essential hypertension, benign 12/08/2007  . DIASTASIS RECTI 12/08/2007  . HYPERLIPIDEMIA 06/26/2006  . ANEMIA, OTHER, UNSPECIFIED 06/26/2006  . Peripheral autonomic neuropathy due to diabetes mellitus (HCC) 06/26/2006  . GASTROESOPHAGEAL REFLUX DISEASE, CHRONIC 06/26/2006  . Enlarged prostate with lower urinary tract symptoms (LUTS) 06/26/2006    Harriet Butte, PT, MPT [facility] [address] [place], [place], [address] Phone: [phone]   Fax:  [phone] October 18, 2015, 12:01 PM  Name: Tanner Mason MRN:  [id number] Date of Birth: Jun 25, 1940

## 2015-10-16 ENCOUNTER — Ambulatory Visit: Payer: Medicare Other | Admitting: Occupational Therapy

## 2015-10-16 ENCOUNTER — Encounter: Payer: Self-pay | Admitting: Occupational Therapy

## 2015-10-16 ENCOUNTER — Ambulatory Visit: Payer: Medicare Other | Admitting: Physical Therapy

## 2015-10-16 VITALS — BP 80/57 | HR 87

## 2015-10-16 DIAGNOSIS — R2689 Other abnormalities of gait and mobility: Secondary | ICD-10-CM

## 2015-10-16 DIAGNOSIS — M6281 Muscle weakness (generalized): Secondary | ICD-10-CM

## 2015-10-16 DIAGNOSIS — R2681 Unsteadiness on feet: Secondary | ICD-10-CM

## 2015-10-16 DIAGNOSIS — R278 Other lack of coordination: Secondary | ICD-10-CM

## 2015-10-16 NOTE — Therapy (Signed)
[facility] [address] [place], [place], [address] Phone: [phone]   Fax:  [phone]  Occupational Therapy Evaluation  Patient Details  Name: Tanner Mason MRN: [id number] Date of Birth: 09-Apr-1941 Referring Provider: Dr. Claudette Laws  Encounter Date: 10/16/2015      OT End of Session - 10/16/15 1102    Visit Number 1   Number of Visits 4   Date for OT Re-Evaluation 11/13/15   Authorization Type UHC medicare - will need G code   Authorization Time Period 60 days   OT Start Time 1015   OT Stop Time 1053   OT Time Calculation (min) 38 min   Activity Tolerance Patient tolerated treatment well   Behavior During Therapy [facility] for tasks assessed/performed      Past Medical History  Diagnosis Date  . Elbow dislocation 6962,9528    Right  . Treadmill stress test negative for angina pectoris 06/2002    poor HR and BP recovery  . Abdominal ultrasound, abnormal 02/2004    fatty liver  . Encounter for diagnostic endoscopy 10/22/04    negative  . History of esophagogastroduodenoscopy 08/13/2007    normal  . Diabetes mellitus without complication [facility])     Past Surgical History  Procedure Laterality Date  . Inguinal hernia repair Right 1947  . Anterior cruciate ligament repair  5/98    Left, staph infection complicated  . Blepharoplasty  03/2005    with ptosis repair  . Basal cell carcinoma excision  02/2005    R ear  . Inguinal hernia repair Left 1970    Filed Vitals:   10/16/15 1019  BP: 80/57  Pulse: 87        Subjective Assessment - 10/16/15 1019    Pertinent History see epic snapshot   Currently in Pain? Yes   Pain Score 3    Pain Location Back   Pain Orientation Lower   Pain Descriptors / Indicators Aching   Pain Type Chronic pain   Pain Onset Other (comment)  pt has had this for 10 years; worse in past two years due to disc problem   Pain Frequency Constant   Aggravating Factors   hospital bed, sitting too long, sitting in the car   Pain Relieving Factors medication, pt said heat felt good but didn't really help and has never tried ice.           [facility] OT Assessment - 10/16/15 0001    Assessment   Diagnosis GBS   Referring Provider Dr. Claudette Laws   Onset Date 09/17/15   Prior Therapy Inpt PT and OT    Precautions   Precautions Fall   Restrictions   Weight Bearing Restrictions No   Balance Screen   Has the patient fallen in the past 6 months No   Home  Environment   Family/patient expects to be discharged to: Private residence   Type of Home House   Home Layout One level   Bathroom Shower/Tub Walk-in Shower   Bathroom Toilet Handicapped height   Additional Comments 3 in 1 commode, seat in shower and hand held shower head, grab bar in shower.   Prior Function   Level of Independence Independent   Vocation Retired   [employer] retired Runner, broadcasting/film/video   Leisure likes to go to [employer], boating and swimming at the [employer]   ADL   Eating/Feeding Independent   Grooming Independent   Product manager Modified independent   Lower [employer]  Bathing Modified independent   Upper Body Dressing Independent   Lower Body Dressing Independent   Toilet Tranfer Modified independent   Toileting - Conservator, museum/gallery -  Psychologist, sport and exercise independent   IADL   Shopping Assistance for transportation   Light Housekeeping Does not participate in any housekeeping tasks   Meal Prep Needs to have meals prepared and served   Engineer, drilling on family or friends for transportation   Medication Management Is responsible for taking medication in correct dosages at correct time   Development worker, community financial matters independently (budgets, writes checks, pays rent, bills goes to bank), collects and keeps track of income   Mobility   Mobility Status Needs assist   Mobility Status Comments Needs  supervision and uses walker   Written Expression   Dominant Hand Right   Handwriting 100% legible  pt states handwriting looks the same   Vision - History   Baseline Vision Wears glasses all the time   Additional Comments Pt denies visual changes   Vision Assessment   Eye Alignment Within Functional Limits   Activity Tolerance   Activity Tolerance Tolerates 30 min activity with muliple rests  pt states he can do about 15-20 minutes then needs rest   Cognition   Overall Cognitive Status Within Functional Limits for tasks assessed   Sensation   Light Touch Appears Intact   Hot/Cold Appears Intact   Proprioception Appears Intact   Coordination   Gross Motor Movements are Fluid and Coordinated Yes   Fine Motor Movements are Fluid and Coordinated Yes   9 Hole Peg Test Right;Left   Right 9 Hole Peg Test 30.25   Left 9 Hole Peg Test 25.49   Tone   Assessment Location Right Upper Extremity;Left Upper Extremity   ROM / Strength   AROM / PROM / Strength AROM;Strength   AROM   Overall AROM  Within functional limits for tasks performed   Strength   Overall Strength Within functional limits for tasks performed   Hand Function   Right Hand Gross Grasp Impaired   Right Hand Grip (lbs) 50   Left Hand Gross Grasp Functional   Left Hand Grip (lbs) 60   RUE Tone   RUE Tone Within Functional Limits   LUE Tone   LUE Tone Within Functional Limits                      Balance Exercises - 10/16/15 1204    Balance Exercises: Standing   Standing, One Foot on a Step Other reps (comment)  Alt tapping on cone then stepping around with SPC Min A   Gait with Head Turns Other (comment)  with SPC, Min Guard   Turning Both  figuere 8 turns and "s" turns, Min guard             OT Short Term Goals - 10/16/15 1056    OT SHORT TERM GOAL #1   Title --  n/a           OT Long Term Goals - 10/16/15 1056    OT LONG TERM GOAL #1   Title Pt will be mod I with HEP to address  grip strength and coordination in RUE   Status New   OT LONG TERM GOAL #2   Title Pt will verbalize understanding of return to driving and driving if eval if needed   Status New   OT  LONG TERM GOAL #3   Title Pt will be mod I with simple hot meal prep at ambulatiory level.   Status New               Plan - 10-18-15 1057    Clinical Impression Statement Pt is a 75 year old male s/p GBS. Pt was hospitalized from 09/17/15-10/09/15 and dicharged home from inpt rehab.  PMH includes: HLD, DM, HTN, peripheral neuropathy, GERDn, chronic renal failiure, lumbar back pain. Pt presents today with the following deficits that impact IADL and leisure:  decreased balance,decreased activity tolerance, decresased coordination and strength in R hand. (dominant hand).  Pt will benfit from short course of skilled OT to address these defictts and maximize independence   Rehab Potential Good   OT Frequency 2x / week   OT Duration 2 weeks   OT Treatment/Interventions Self-care/ADL training;Therapeutic exercise;Neuromuscular education;Building services engineer;Therapeutic activities;Patient/family education;Balance training   Plan intiate HEP for hand strength and coodination   Consulted and Agree with Plan of Care Patient;Family member/caregiver   Family Member Consulted wife      Patient will benefit from skilled therapeutic intervention in order to improve the following deficits and impairments:  Abnormal gait, Decreased activity tolerance, Decreased balance, Decreased coordination, Decreased strength, Impaired UE functional use, Pain  Visit Diagnosis: Muscle weakness (generalized) - Plan: Ot plan of care cert/re-cert  Other lack of coordination - Plan: Ot plan of care cert/re-cert  Other abnormalities of gait and mobility - Plan: Ot plan of care cert/re-cert      G-Codes - 10-18-15 1249    Functional Assessment Tool Used 9 hole peg, dynamometer, skilled clinical observation   Functional  Limitation Carrying, moving and handling objects   Carrying, Moving and Handling Objects Current Status [phone]) At least 20 percent but less than 40 percent impaired, limited or restricted   Carrying, Moving and Handling Objects Goal Status ([id number]) At least 1 percent but less than 20 percent impaired, limited or restricted      Problem List Patient Active Problem List   Diagnosis Date Noted  . Benign essential HTN   . Irregular bowel habits   . Constipation   . Thrombocytopenia (HCC)   . Acute on chronic renal insufficiency (HCC)   . Normocytic anemia   . Type 2 diabetes mellitus with peripheral neuropathy (HCC)   . Bilateral leg weakness   . Idiopathic angioedema   . Leg weakness   . Back pain   . Hypomagnesemia   . Type 2 diabetes mellitus with diabetic neuropathy, unspecified (HCC)   . Guillain Barr syndrome (HCC)   . Weakness 09/17/2015  . Gait abnormality 09/17/2015  . Gait instability 09/17/2015  . Cellulitis and abscess 07/14/2015  . Preventative health care 11/08/2014  . Angio-edema 07/14/2014  . Angioedema 07/13/2014  . Left foot drop 04/05/2014  . Lumbar back pain with radiculopathy affecting left lower extremity 04/05/2014  . Atypical chest pain 12/14/2013  . Hypokalemia 03/16/2013  . Overweight (BMI 25.0-29.9) 09/15/2012  . Stage 3 chronic renal impairment associated with type 2 diabetes mellitus (HCC) 07/20/2010  . ULNAR NERVE ENTRAPMENT, RIGHT 06/07/2008  . Diabetes mellitus (HCC) 12/09/2007  . Essential hypertension, benign 12/08/2007  . DIASTASIS RECTI 12/08/2007  . HYPERLIPIDEMIA 06/26/2006  . ANEMIA, OTHER, UNSPECIFIED 06/26/2006  . Peripheral autonomic neuropathy due to diabetes mellitus (HCC) 06/26/2006  . GASTROESOPHAGEAL REFLUX DISEASE, CHRONIC 06/26/2006  . Enlarged prostate with lower urinary tract symptoms (LUTS) 06/26/2006    Norton Pastel, OTR/L 2015-10-18,  12:52 PM  [facility] [address] [place], [place], [address] Phone: [phone]   Fax:  [phone]  Name: Dorthula RueCharles A Landing MRN: [id number] Date of Birth: 1940-08-25

## 2015-10-16 NOTE — Therapy (Signed)
[facility] [address] [place], [place], [address] Phone: [phone]   Fax:  [phone]  Physical Therapy Treatment  Patient Details  Name: Tanner RueCharles A Kiddy MRN: [id number] Date of Birth: 11/04/1940 Referring Provider: Maryla MorrowAnkit Patel, MD  Encounter Date: 10/16/2015      PT End of Session - 10/16/15 1207    Visit Number 2   Number of Visits 17   Date for PT Re-Evaluation 12/11/15   Authorization Type UHC MDC-G Code on every 10th visit   PT Start Time 1105   PT Stop Time 1145   PT Time Calculation (min) 40 min   Equipment Utilized During Treatment Gait belt   Activity Tolerance Patient tolerated treatment well   Behavior During Therapy [facility] for tasks assessed/performed      Past Medical History  Diagnosis Date  . Elbow dislocation [id number]    Right  . Treadmill stress test negative for angina pectoris 06/2002    poor HR and BP recovery  . Abdominal ultrasound, abnormal 02/2004    fatty liver  . Encounter for diagnostic endoscopy 10/22/04    negative  . History of esophagogastroduodenoscopy 08/13/2007    normal  . Diabetes mellitus without complication [facility](HCC)     Past Surgical History  Procedure Laterality Date  . Inguinal hernia repair Right 1947  . Anterior cruciate ligament repair  5/98    Left, staph infection complicated  . Blepharoplasty  03/2005    with ptosis repair  . Basal cell carcinoma excision  02/2005    R ear  . Inguinal hernia repair Left 1970    There were no vitals filed for this visit.      Subjective Assessment - 10/16/15 1119    Subjective Pt has HEP for LE strengthening and standing balance.  Has not been preforming consistently.   Patient is accompained by: Family member   Currently in Pain? Yes   Pain Score 3    Pain Location Back   Pain Orientation Lower   Pain Descriptors / Indicators Aching   Pain Type Chronic pain   Pain Onset Other (comment)  pt has had this for  10 years; worse in past two years due to disc problem                         [facility] Adult PT Treatment/Exercise - 10/16/15 0001    Ambulation/Gait   Ambulation/Gait Yes   Ambulation/Gait Assistance 6: Modified independent (Device/Increase time);4: Min guard  Mod I with rollator and Min guard with SPC   Ambulation/Gait Assistance Details worked on sequencing with SPC and safe placement   Ambulation Distance (Feet) 345 Feet  200   Assistive device 4-wheeled walker;Straight cane   Gait Pattern Step-through pattern;Decreased stride length;Decreased dorsiflexion - right;Decreased dorsiflexion - left   Ambulation Surface Level;Indoor   Gait Comments greater inbalance and coordination difficulties as LE fatigued   Lumbar Exercises: Supine   Bridge 10 reps  x 2    Ankle Exercises: Seated   Toe Raise 20 reps  with red T-band             Balance Exercises - 10/16/15 1204    Balance Exercises: Standing   Standing, One Foot on a Step Other reps (comment)  Alt tapping on cone then stepping around with SPC Min A   Gait with Head Turns Other (comment)  with SPC, Min Guard   Turning Both  figuere 8 turns and "  s" turns, Min guard           PT Education - 10/16/15 1200    Education provided Yes   Education Details Explained benefits for performing HEP consistently and how to rotate through the exercises in HEP handouts   Person(s) Educated Patient   Methods Explanation   Comprehension Verbalized understanding          PT Short Term Goals - 10/12/15 1148    PT SHORT TERM GOAL #1   Title Pt will initiate HEP for BLE strengthening and balance in order to decrease fall risk and improve functional mobility.  (Target Date: 11/09/15)   Status New   PT SHORT TERM GOAL #2   Title Pt wil improve gait speed to 2.41 ft/sec in order to decrease fall risk and improve efficiency of gait.     Status New   PT SHORT TERM GOAL #3   Title Pt wil improve BERG balance score to  31/56 in order to demonstrate decreased fall risk.     PT SHORT TERM GOAL #4   Title Will assess and improve distance by 150' in order to demonstrate improved functional endurance.     Status New   PT SHORT TERM GOAL #5   Title Pt will ambulate >200' over indoor surfaces w/ SPC at S level in order to demonstrate increased independence with gait.             PT Long Term Goals - 10/12/15 1152    PT LONG TERM GOAL #1   Title Pt will be independent with HEP in order to demonstrate improved functional mobility and decreased fall risk.  (Target Date: 12/07/15)   Status New   PT LONG TERM GOAL #2   Title Pt will improve gait speed to >2.62 ft/sec in order to indicate pt is community ambulator.     Status New   PT LONG TERM GOAL #3   Title Pt will increase BERG balance score to 37/56 in order to indicate decreased fall risk.    Status New   PT LONG TERM GOAL #4   Title Pt will verbalize particpation in walking program/return to community fitness in order to maintain and progress endurance.     Status New   PT LONG TERM GOAL #5   Title Pt will ambulate with SPC over outdoor surfaces (paved and grass) at mod I level in order to demonstrate return to community negotiation.     Additional Long Term Goals   Additional Long Term Goals Yes   PT LONG TERM GOAL #6   Title Pt will ambulate up/down grassy hill with LRAD at S level in order to [place].     Status New               Plan - 10/16/15 1208    Clinical Impression Statement Progressed Bil Ankle DF strengthening exercise with Red T-band.  Progressed gait training with SPC; pt requiring Min guard for balance with good carry over with gait sequence with SPC.   Rehab Potential Good   Clinical Impairments Affecting Rehab Potential co-morbidities   PT Frequency 2x / week   PT Duration 8 weeks   PT Treatment/Interventions ADLs/Self Care Home Management;Electrical Stimulation;DME Instruction;Gait  training;Stair training;Functional mobility training;Therapeutic activities;Therapeutic exercise;Balance training;Neuromuscular re-education;Patient/family education;Orthotic Fit/Training;Energy conservation;Vestibular   PT Next Visit Plan Review current HEP for B LE strength (supine and standing if possible, sit<>stand), 6 MWT, gait with SPC   Consulted and  Agree with Plan of Care Patient;Family member/caregiver   Family Member Consulted wife [name]      Patient will benefit from skilled therapeutic intervention in order to improve the following deficits and impairments:  Abnormal gait, Decreased activity tolerance, Decreased balance, Decreased endurance, Decreased knowledge of precautions, Decreased mobility, Decreased safety awareness, Decreased strength, Difficulty walking, Dizziness, Impaired perceived functional ability, Impaired sensation, Improper body mechanics, Postural dysfunction  Visit Diagnosis: Unsteadiness on feet  Muscle weakness (generalized)  Other abnormalities of gait and mobility     Problem List Patient Active Problem List   Diagnosis Date Noted  . Benign essential HTN   . Irregular bowel habits   . Constipation   . Thrombocytopenia (HCC)   . Acute on chronic renal insufficiency (HCC)   . Normocytic anemia   . Type 2 diabetes mellitus with peripheral neuropathy (HCC)   . Bilateral leg weakness   . Idiopathic angioedema   . Leg weakness   . Back pain   . Hypomagnesemia   . Type 2 diabetes mellitus with diabetic neuropathy, unspecified (HCC)   . Guillain Barr syndrome (HCC)   . Weakness 09/17/2015  . Gait abnormality 09/17/2015  . Gait instability 09/17/2015  . Cellulitis and abscess 07/14/2015  . Preventative health care 11/08/2014  . Angio-edema 07/14/2014  . Angioedema 07/13/2014  . Left foot drop 04/05/2014  . Lumbar back pain with radiculopathy affecting left lower extremity 04/05/2014  . Atypical chest pain 12/14/2013  . Hypokalemia 03/16/2013   . Overweight (BMI 25.0-29.9) 09/15/2012  . Stage 3 chronic renal impairment associated with type 2 diabetes mellitus (HCC) 07/20/2010  . ULNAR NERVE ENTRAPMENT, RIGHT 06/07/2008  . Diabetes mellitus (HCC) 12/09/2007  . Essential hypertension, benign 12/08/2007  . DIASTASIS RECTI 12/08/2007  . HYPERLIPIDEMIA 06/26/2006  . ANEMIA, OTHER, UNSPECIFIED 06/26/2006  . Peripheral autonomic neuropathy due to diabetes mellitus (HCC) 06/26/2006  . GASTROESOPHAGEAL REFLUX DISEASE, CHRONIC 06/26/2006  . Enlarged prostate with lower urinary tract symptoms (LUTS) 06/26/2006    Hortencia Conradi, PTA  10/16/2015, 12:11 PM [facility] [address] [place], [place], [address] Phone: [phone]   Fax:  [phone]  Name: MAXSON ODDO MRN: [id number] Date of Birth: Jul 08, 1940

## 2015-10-16 NOTE — Patient Instructions (Signed)
ANKLE: Dorsiflexion (Band)    Sit at edge of surface. Place band around top of foot. Keeping heel on floor, raise toes of banded foot. Hold _3__ seconds. Use ____red____ band. _20__ reps per set, _1__ sets per dayCopyright  VHI. All rights reserved.

## 2015-10-17 ENCOUNTER — Ambulatory Visit: Payer: Medicare Other | Admitting: Occupational Therapy

## 2015-10-17 ENCOUNTER — Encounter: Payer: Self-pay | Admitting: Occupational Therapy

## 2015-10-17 DIAGNOSIS — R2681 Unsteadiness on feet: Secondary | ICD-10-CM | POA: Diagnosis not present

## 2015-10-17 DIAGNOSIS — M6281 Muscle weakness (generalized): Secondary | ICD-10-CM

## 2015-10-17 DIAGNOSIS — R2689 Other abnormalities of gait and mobility: Secondary | ICD-10-CM

## 2015-10-17 DIAGNOSIS — R278 Other lack of coordination: Secondary | ICD-10-CM

## 2015-10-17 NOTE — Therapy (Signed)
[facility] [address] [place], [place], [address] Phone: [phone]   Fax:  [phone]  Occupational Therapy Treatment  Patient Details  Name: Tanner Mason MRN: [id number] Date of Birth: [birth year] Referring Provider: Dr. Claudette Laws  Encounter Date: 10/17/2015      OT End of Session - 10/17/15 1557    Visit Number 2   Number of Visits 4   Date for OT Re-Evaluation 11/13/15   Authorization Type UHC medicare - will need G code   Authorization Time Period 60 days   OT Start Time 1401   OT Stop Time 1445   OT Time Calculation (min) 44 min   Activity Tolerance Patient tolerated treatment well      Past Medical History  Diagnosis Date  . Elbow dislocation 6962,9528    Right  . Treadmill stress test negative for angina pectoris 06/2002    poor HR and BP recovery  . Abdominal ultrasound, abnormal 02/2004    fatty liver  . Encounter for diagnostic endoscopy 10/22/04    negative  . History of esophagogastroduodenoscopy 08/13/2007    normal  . Diabetes mellitus without complication [facility])     Past Surgical History  Procedure Laterality Date  . Inguinal hernia repair Right 1947  . Anterior cruciate ligament repair  5/98    Left, staph infection complicated  . Blepharoplasty  03/2005    with ptosis repair  . Basal cell carcinoma excision  02/2005    R ear  . Inguinal hernia repair Left 1970    There were no vitals filed for this visit.      Subjective Assessment - 10/17/15 1405    Pertinent History see epic snapshot   Currently in Pain? Yes   Pain Score 5    Pain Location Back   Pain Orientation Lower   Pain Descriptors / Indicators Aching   Pain Type Chronic pain   Pain Frequency Constant   Aggravating Factors  hospital bed, sitting too long, sitting in the car   Pain Relieving Factors medication                      OT Treatments/Exercises (OP) - 10/17/15 0001    ADLs    Driving Pt inquiring about driving. Provided overview as well as info on driving eval options.  Recommneded that pt wait until his gait and LE control improve to see if driving eval is necesary.  Pt is aware that he needs to have medical clearance first and also able to verbalize understanding of gradated driving program for return to driving with wife providing supervision initially   Exercises   Exercises Hand   Hand Exercises   Theraputty Flatten;Roll;Grip;Pinch   Theraputty - Flatten Pt issued HEP with green putty - see pt instruction section for details. Pt able to return demostrate all activities.    Other Hand Exercises Pt also issued HEP for coordination of R hand - see pt instruction section for details.  Pt able to return demonstrate all activities.             Balance Exercises - 10/16/15 1204    Balance Exercises: Standing   Standing, One Foot on a Step Other reps (comment)  Alt tapping on cone then stepping around with SPC Min A   Gait with Head Turns Other (comment)  with SPC, Min Guard   Turning Both  figuere 8 turns and "s" turns, Min guard  OT Education - 10/17/15 1555    Education provided Yes   Education Details theraputty, coordination, info on driving eval prn   Person(s) Educated Patient;Spouse   Methods Explanation;Demonstration;Verbal cues;Handout   Comprehension Verbalized understanding;Returned demonstration          OT Short Term Goals - 2015/10/28 1056    OT SHORT TERM GOAL #1   Title --  n/a           OT Long Term Goals - 10/17/15 1556    OT LONG TERM GOAL #1   Title Pt will be mod I with HEP to address grip strength and coordination in RUE   Status Achieved   OT LONG TERM GOAL #2   Title Pt will verbalize understanding of return to driving and driving if eval if needed   Status Achieved   OT LONG TERM GOAL #3   Title Pt will be mod I with simple hot meal prep at ambulatiory level.   Status On-going                Plan - 10/17/15 1556    Clinical Impression Statement Pt progressing toward goals. Pt very motivated to work on activities at home   Rehab Potential Good   OT Frequency 2x / week   OT Duration 2 weeks   OT Treatment/Interventions Self-care/ADL training;Therapeutic exercise;Neuromuscular education;Building services engineer;Therapeutic activities;Patient/family education;Balance training   Plan check HEP, simple hot meal prep at ambulatory level   Consulted and Agree with Plan of Care Patient;Family member/caregiver   Family Member Consulted wife      Patient will benefit from skilled therapeutic intervention in order to improve the following deficits and impairments:  Abnormal gait, Decreased activity tolerance, Decreased balance, Decreased coordination, Decreased strength, Impaired UE functional use, Pain  Visit Diagnosis: Muscle weakness (generalized)  Other abnormalities of gait and mobility  Other lack of coordination      G-Codes - October 28, 2015 1249    Functional Assessment Tool Used 9 hole peg, dynamometer, skilled clinical observation   Functional Limitation Carrying, moving and handling objects   Carrying, Moving and Handling Objects Current Status [id number]) At least 20 percent but less than 40 percent impaired, limited or restricted   Carrying, Moving and Handling Objects Goal Status (U0454) At least 1 percent but less than 20 percent impaired, limited or restricted      Problem List Patient Active Problem List   Diagnosis Date Noted  . Benign essential HTN   . Irregular bowel habits   . Constipation   . Thrombocytopenia (HCC)   . Acute on chronic renal insufficiency (HCC)   . Normocytic anemia   . Type 2 diabetes mellitus with peripheral neuropathy (HCC)   . Bilateral leg weakness   . Idiopathic angioedema   . Leg weakness   . Back pain   . Hypomagnesemia   . Type 2 diabetes mellitus with diabetic neuropathy, unspecified (HCC)   . Guillain Barr syndrome  (HCC)   . Weakness 09/17/2015  . Gait abnormality 09/17/2015  . Gait instability 09/17/2015  . Cellulitis and abscess 07/14/2015  . Preventative health care 11/08/2014  . Angio-edema 07/14/2014  . Angioedema 07/13/2014  . Left foot drop 04/05/2014  . Lumbar back pain with radiculopathy affecting left lower extremity 04/05/2014  . Atypical chest pain 12/14/2013  . Hypokalemia 03/16/2013  . Overweight (BMI 25.0-29.9) 09/15/2012  . Stage 3 chronic renal impairment associated with type 2 diabetes mellitus (HCC) 07/20/2010  . ULNAR NERVE ENTRAPMENT, RIGHT 06/07/2008  .  Diabetes mellitus (HCC) 12/09/2007  . Essential hypertension, benign 12/08/2007  . DIASTASIS RECTI 12/08/2007  . HYPERLIPIDEMIA 06/26/2006  . ANEMIA, OTHER, UNSPECIFIED 06/26/2006  . Peripheral autonomic neuropathy due to diabetes mellitus (HCC) 06/26/2006  . GASTROESOPHAGEAL REFLUX DISEASE, CHRONIC 06/26/2006  . Enlarged prostate with lower urinary tract symptoms (LUTS) 06/26/2006    Norton PastelPulaski, Rexford Prevo Halliday, OTR/L 10/17/2015, 3:58 PM  [facility] [address] [place], [place], [address] Phone: [phone]   Fax:  [phone]  Name: Dorthula RueCharles A Bilal MRN: [id number] Date of Birth: [birth year]

## 2015-10-17 NOTE — Patient Instructions (Addendum)
  Coordination Activities  Perform the following activities for 15-20 minutes 1-2 times per day with right hand(s).   Rotate ball in fingertips (clockwise and counter-clockwise).  Toss ball between hands.  Toss ball in air and catch with the same hand.  Flip cards 1 at a time as fast as you can.  Deal cards with your thumb (Hold deck in hand and push card off top with thumb).  Shuffle cards.  Pick up coins, buttons, marbles, dried beans/pasta of different sizes and place in container.  Pick up coins and place in container or coin bank.  Pick up coins and stack.  Screw together nuts and bolts, then unfasten.    Theraputty exercises:  Using green color putty:  Do these 1-2 times per day. STOP if you get pain and let your therapist know.  1. Make a ball     Make a pancake     Make a cone Do this sequence 5 times  2. Make a fat hot dog. Squeeze as hard as you can. Do this 10 times 3. Ring around the fingers.  Do this 5-7 times. 5. Make a snake:  2 pt pinch  3 pt pinch  Lateral pinch  Do these 2 times.    Local Driver Evaluation Programs:  Comprehensive Evaluation: includes clinical and in vehicle behind the wheel testing by OCCUPATIONAL THERAPIST. Programs have varying levels of adaptive controls available for trial.   [employer], [place] [address] [place], [place]  [address] [phone] or [phone] http://www.driver-rehab.com Evaluator:  Elvera Lennoxyndee Crompton, OT/CDRS/CDI/SCDCM/Low Vision Certification  [facility] [address] [place], [place] [address] [phone] [email or web address] Evaluators:  Rockwell AlexandriaShannon Sheek, OT and Yves DillJill Tucker, OT  [facility] (ONLY SERVES VETERANS!!) [facility] Rehabilitation [facility] [address] [place], [place]  [address] [phone]  [phone] http://www.[place].NumericNews.glva.gov/services/Physical_Medicine_Rehabilitation_Services.asp Evaluators:  Randall HissEric Andrews, KT; Rayburn FeltHeidi Harris, KT;  Sheran LuzGary Whitaker, KT (KT=kiniesotherapist)   Clinical evaluations only:  Includes clinical testing, refers to other programs or local certified driving instructor for behind the wheel testing.  [facility] ([facility]) Medical Okey Duprelaza- [place] [address] [place], [place] [address] [phone] for scheduling [email or web address]-[email or web address] Evaluators:  Steward DroneKelly Lambeth, OT; Delma PostKate Phillips, OT  Other area clinical evaluators available upon request including [facility], [facility] and [facility].       Resource List What is a Industrial/product designerDriver Evaluation: Your Road Ahead - A Guide to [employer] EnergyComprehensive Driving Evaluations http://www.thehartford.com/resources/mature-market-excellence/publications-on-aging  Association for Academic librarianDriver Rehabilitation Services - Disability and Driving Fact Sheets http://www.aded.net/?page=510  Driving after a Brain Injury: Brain Injury Association of America VCShow.co.zahttp://www.biausa.org/tbims-abstracts/if-there-is-an-effective-way-to-determine-if-someone-is-ready-to-drive-after-tbi?A=SearchResult&SearchID=9495675&ObjectID=2758842&ObjectType=35  Driving with Adaptive Equipment: GafferDriver Rehabilitation Services Process http://www.driver-rehab.com/adaptive-equipment  National Mobility Equipment Dealers Association https://aguirre-arnold.org/http://www.nmeda.com/

## 2015-10-19 ENCOUNTER — Encounter: Payer: Self-pay | Admitting: Physical Therapy

## 2015-10-19 ENCOUNTER — Encounter: Payer: Medicare Other | Attending: Physical Medicine & Rehabilitation | Admitting: Physical Medicine & Rehabilitation

## 2015-10-19 ENCOUNTER — Encounter: Payer: Self-pay | Admitting: Physical Medicine & Rehabilitation

## 2015-10-19 ENCOUNTER — Ambulatory Visit: Payer: Medicare Other | Admitting: Physical Therapy

## 2015-10-19 VITALS — BP 119/67 | HR 80 | Resp 16

## 2015-10-19 DIAGNOSIS — R278 Other lack of coordination: Secondary | ICD-10-CM

## 2015-10-19 DIAGNOSIS — D696 Thrombocytopenia, unspecified: Secondary | ICD-10-CM | POA: Diagnosis not present

## 2015-10-19 DIAGNOSIS — G610 Guillain-Barre syndrome: Secondary | ICD-10-CM | POA: Insufficient documentation

## 2015-10-19 DIAGNOSIS — R2681 Unsteadiness on feet: Secondary | ICD-10-CM

## 2015-10-19 DIAGNOSIS — Z820 Family history of epilepsy and other diseases of the nervous system: Secondary | ICD-10-CM | POA: Insufficient documentation

## 2015-10-19 DIAGNOSIS — I951 Orthostatic hypotension: Secondary | ICD-10-CM | POA: Diagnosis not present

## 2015-10-19 DIAGNOSIS — N179 Acute kidney failure, unspecified: Secondary | ICD-10-CM | POA: Diagnosis not present

## 2015-10-19 DIAGNOSIS — E1142 Type 2 diabetes mellitus with diabetic polyneuropathy: Secondary | ICD-10-CM | POA: Diagnosis not present

## 2015-10-19 DIAGNOSIS — R200 Anesthesia of skin: Secondary | ICD-10-CM | POA: Diagnosis not present

## 2015-10-19 DIAGNOSIS — F1010 Alcohol abuse, uncomplicated: Secondary | ICD-10-CM | POA: Insufficient documentation

## 2015-10-19 DIAGNOSIS — Z833 Family history of diabetes mellitus: Secondary | ICD-10-CM | POA: Insufficient documentation

## 2015-10-19 DIAGNOSIS — Z87891 Personal history of nicotine dependence: Secondary | ICD-10-CM | POA: Insufficient documentation

## 2015-10-19 DIAGNOSIS — R208 Other disturbances of skin sensation: Secondary | ICD-10-CM

## 2015-10-19 DIAGNOSIS — M6281 Muscle weakness (generalized): Secondary | ICD-10-CM

## 2015-10-19 DIAGNOSIS — R2689 Other abnormalities of gait and mobility: Secondary | ICD-10-CM

## 2015-10-19 NOTE — Progress Notes (Signed)
Subjective:    Patient ID: Tanner Mason, male    DOB: October 07, 1940, 75 y.o.   MRN: [id number]  HPI  75 y.o. male with history of DMT2 with neuropathy presents for transitional care management after being discharged from CIR for GBS.    Admit date: 09/26/2015 Discharge date: 10/09/2015  At discharge, he was encouraged to follow up with follow up with Neurology (appointment in 1 month).  He has an appointment with PCP next week.  He has been checking his CBGs, which have been under 200.  He has not had his blood drawn.  He denies orthostatic symptoms. His constipation has resolved.  He states he is doing much better. His wife reports orthostatic hypotension x1 with therapy, however, pt asymptomatic.    Therapies: 2/week.   Mobility: Rollator DME: Paediatric nursehower chair.  Pain Inventory Average Pain 5 Pain Right Now 2 My pain is constant and aching  In the last 24 hours, has pain interfered with the following? General activity 4 Relation with others 4 Enjoyment of life 4 What TIME of day is your pain at its worst? daytime Sleep (in general) Good  Pain is worse with: bending and sitting Pain improves with: rest and medication Relief from Meds: 7  Mobility walk with assistance use a walker ability to climb steps?  yes do you drive?  no  Function retired  Neuro/Psych numbness tingling trouble walking  Prior Studies CT/MRI  Physicians involved in your care Any changes since last visit?  yes   Family History  Problem Relation Age of Onset  . Alzheimer's disease Mother     died age 73  . Aortic aneurysm Father     died age 73  . Anuerysm Father   . Aortic aneurysm Brother     repaired plus AI graft  . Diabetes Brother    Social History   Social History  . Marital Status: Married    Spouse Name: Junious DresserConnie  . Number of Children: 2  . Years of Education: 4 college   Occupational History  . teacher     retired   . MEDIA SPECIALIST    Social History Main Topics    . Smoking status: Former Smoker -- 1.00 packs/day for 10 years    Types: Cigarettes    Start date: 0[birth year] 1944    Quit date: 06/10/1967  . Smokeless tobacco: Never Used  . Alcohol Use: 1.0 oz/week    2 Standard drinks or equivalent per week     Comment: occasional  . Drug Use: No  . Sexual Activity:    Partners: Female   Other Topics Concern  . None   Social History Narrative   Daughter, Nehemiah [place]Brooke in [place], has 3 children   Daugher, Marcelino DusterMichelle, in [place], 2 children      He retired from teaching in 2010      Health Care POA:    Emergency Contact: wife Junious DresserConnie   End of Life Plan:    Who lives with you: wife   Any pets: none   Diet: Patient has a varied diet of vegetables, protein, and starch.  Pt reports enjoying his sweets and carbohydrates.   Exercise: [facility].  Cardio, strength    Seatbelts: Pt reports wearing seatbelt when in vehicle.    Sun Exposure/Protection: Pt reports wearing sun screen/hat and is under care of dermatologist.   Hobbies: boating, vacationing            Past Surgical  History  Procedure Laterality Date  . Inguinal hernia repair Right 1947  . Anterior cruciate ligament repair  5/98    Left, staph infection complicated  . Blepharoplasty  03/2005    with ptosis repair  . Basal cell carcinoma excision  02/2005    R ear  . Inguinal hernia repair Left 1970   Past Medical History  Diagnosis Date  . Elbow dislocation [id number]    Right  . Treadmill stress test negative for angina pectoris 06/2002    poor HR and BP recovery  . Abdominal ultrasound, abnormal 02/2004    fatty liver  . Encounter for diagnostic endoscopy 10/22/04    negative  . History of esophagogastroduodenoscopy 08/13/2007    normal  . Diabetes mellitus without complication (HCC)    BP 119/67 mmHg  Pulse 80  Resp 16  SpO2 97%  Opioid Risk Score:   Fall Risk Score:  `1  Depression screen PHQ 2/9  [facility] 07/14/2015 02/23/2015  11/08/2014 07/04/2014 05/04/2014 04/05/2014 06/28/2013  Decreased Interest 0 0 0 0 0 0 0  Down, Depressed, Hopeless 0 0 0 0 0 0 0  PHQ - 2 Score 0 0 0 0 0 0 0   Review of Systems  Musculoskeletal: Positive for gait problem.  Neurological: Positive for numbness.       Tingling   All other systems reviewed and are negative.     Objective:   Physical Exam Constitutional: He appears well-developed and well-nourished. No distress.   HENT:  Normocephalic and atraumatic.    Eyes: Conjunctivae and EOM are normal. No scleral icterus.  Cardiovascular: Normal rate and regular rhythm.    Respiratory: Effort normal and breath sounds normal. No respiratory distress. He exhibits no tenderness.   GI: Soft. Bowel sounds are normal. He exhibits no distension. There is no tenderness.  Musculoskeletal: He exhibits no edema or tenderness.  Neurological: He is alert and oriented.  Mild ptosis on left -chronic.   Motor: 5/5 B/l UE proximal to distal RLE: 5/5 proximally, 4+/5 ankle dorsiflexion LLE: 5/5 proximally, 4/5 ankle dorsiflexion Skin: Skin is warm and dry. He is not diaphoretic.  Psychiatric: He has a normal mood and affect. His behavior is normal. Judgment and thought content normal    Assessment & Plan:  75 y.o. male with history of DMT2 with neuropathy presents for transitional care management after being discharged from CIR for GBS.    1. Guillain Barre Syndrome  Cont therapies  Follow up with Neurology  2. Peripheral Neuropathy  Pt wishes to cont current dose of Gabapentin   3. T2DM with peripheral neuropathy:     Follow up with PCP for adjustments to medication   4. Orthostasis  x1 with therapy  Cont to follow for further episodes.  No changes at present   5. AKI  Will order repeat labs  Follow up with PCP  6. Thrombocytopenia  Will order labs to follow up on hospital thrombocytopenia  Meds reviewed Referrals reviewed All questions answered

## 2015-10-19 NOTE — Patient Instructions (Signed)
"  I love a Licensed conveyancerarade" Lift   At counter for balance as needed: high knee marching forward and then backward. 3 second pauses with each knee lift.  Repeat 3 laps each way. Do _1-2_ sessions per day. http://gt2.exer.us/345   Copyright  VHI. All rights reserved.  .  Walking on Bear StearnsHeels   Hol counter for balance: Walk on heels forward while continuing on a straight path, and then walk on heels backward to starting position. Repeat for 3 laps each way. Do _1-2__ sessions per day.  Copyright  VHI. All rights reserved.  Walking on Toes   hold counter for balance as needed: Walk on toes forward while continuing on a straight path, and then backwards on toes to starting position. Repeat 3 laps each way. Do _1-2___ sessions per day.  Copyright  VHI. All rights reserved.  Feet Heel-Toe "Tandem"   At counter: Arms at sides/holding onto counter top, walk a straight line forward bringing one foot directly in front of the other, and then a straight line backwards bringing one foot directly behind the other one.  Repeat for _3 laps each way. Do _1-2_ sessions per day.  Copyright  VHI. All rights reserved.

## 2015-10-20 LAB — CBC WITH DIFFERENTIAL/PLATELET
Basophils Absolute: 0 10*3/uL (ref 0.0–0.2)
Basos: 0 %
EOS (ABSOLUTE): 0.1 10*3/uL (ref 0.0–0.4)
Eos: 3 %
Hematocrit: 30.5 % — ABNORMAL LOW (ref 37.5–51.0)
Hemoglobin: 10.3 g/dL — ABNORMAL LOW (ref 12.6–17.7)
Lymphocytes Absolute: 1.6 10*3/uL (ref 0.7–3.1)
Lymphs: 35 %
MCH: 30.5 pg (ref 26.6–33.0)
MCHC: 33.8 g/dL (ref 31.5–35.7)
MCV: 90 fL (ref 79–97)
Monocytes Absolute: 0.4 10*3/uL (ref 0.1–0.9)
Monocytes: 8 %
Neutrophils Absolute: 2.4 10*3/uL (ref 1.4–7.0)
Neutrophils: 54 %
Platelets: 142 10*3/uL — ABNORMAL LOW (ref 150–379)
RBC: 3.38 x10E6/uL — ABNORMAL LOW (ref 4.14–5.80)
RDW: 16.2 % — ABNORMAL HIGH (ref 12.3–15.4)
WBC: 4.4 10*3/uL (ref 3.4–10.8)

## 2015-10-20 LAB — BASIC METABOLIC PANEL
BUN/Creatinine Ratio: 10 (ref 10–24)
BUN: 17 mg/dL (ref 8–27)
CO2: 29 mmol/L (ref 18–29)
Calcium: 9.3 mg/dL (ref 8.6–10.2)
Chloride: 105 mmol/L (ref 96–106)
Creatinine, Ser: 1.71 mg/dL — ABNORMAL HIGH (ref 0.76–1.27)
GFR calc Af Amer: 45 mL/min/{1.73_m2} — ABNORMAL LOW (ref >59)
GFR calc non Af Amer: 39 mL/min/{1.73_m2} — ABNORMAL LOW (ref >59)
Glucose: 97 mg/dL (ref 65–99)
Potassium: 3.9 mmol/L (ref 3.5–5.2)
Sodium: 140 mmol/L (ref 134–144)

## 2015-10-20 NOTE — Therapy (Signed)
[facility] [address] [place], [place], [address] Phone: [phone]   Fax:  [phone]  Physical Therapy Treatment  Patient Details  Name: Tanner RueCharles A Mason MRN: [id number] Date of Birth: 05-29-1940 Referring Provider: Maryla MorrowAnkit Patel, MD  Encounter Date: 10/19/2015      PT End of Session - 10/19/15 0937    Visit Number 3   Number of Visits 17   Date for PT Re-Evaluation 12/11/15   Authorization Type UHC MDC-G Code on every 10th visit   PT Start Time 0931   PT Stop Time 1015   PT Time Calculation (min) 44 min   Equipment Utilized During Treatment Gait belt   Activity Tolerance Patient tolerated treatment well   Behavior During Therapy [facility] for tasks assessed/performed      Past Medical History  Diagnosis Date  . Elbow dislocation [id number]    Right  . Treadmill stress test negative for angina pectoris 06/2002    poor HR and BP recovery  . Abdominal ultrasound, abnormal 02/2004    fatty liver  . Encounter for diagnostic endoscopy 10/22/04    negative  . History of esophagogastroduodenoscopy 08/13/2007    normal  . Diabetes mellitus without complication [facility](HCC)     Past Surgical History  Procedure Laterality Date  . Inguinal hernia repair Right 1947  . Anterior cruciate ligament repair  5/98    Left, staph infection complicated  . Blepharoplasty  03/2005    with ptosis repair  . Basal cell carcinoma excision  02/2005    R ear  . Inguinal hernia repair Left 1970    There were no vitals filed for this visit.      Subjective Assessment - 10/19/15 0936    Subjective No new complaints. No falls or pain to report.   Patient is accompained by: Family member  spouse, Junious DresserConnie   Pertinent History DM with peripheral neruropathy, hx of LBP w/ radiculopathy   Limitations Walking;House hold activities;Standing   Patient Stated Goals To walk again normally and [place]    Currently  in Pain? No/denies   Pain Score 0-No pain            [facility] PT Assessment - 10/19/15 0938    6 Minute Walk- Baseline   6 Minute Walk- Baseline yes   BP (mmHg) 131/66 mmHg   HR (bpm) 84   02 Sat (%RA) 96 %   Modified Borg Scale for Dyspnea 0- Nothing at all   Perceived Rate of Exertion (Borg) 6-   6 Minute walk- Post Test   6 Minute Walk Post Test yes   BP (mmHg) 113/86 mmHg   HR (bpm) 88   02 Sat (%RA) 98 %   Modified Borg Scale for Dyspnea 0- Nothing at all   Perceived Rate of Exertion (Borg) 11- Fairly light   6 minute walk test results    Aerobic Endurance Distance Walked 874   Endurance additional comments using rollator with no rest breaks needed.           [facility]Exercise - 10/19/15 0952    Ambulation/Gait   Ambulation/Gait Yes   Ambulation/Gait Assistance 6: Modified independent (Device/Increase time)   Assistive device Rollator   Gait Pattern Step-through pattern;Decreased stride length   Ambulation Surface Level;Indoor   Knee/Hip Exercises: Aerobic   Nustep level 5 with all 4 extremities x 10 minutes with goal of >/= 60 steps per minute for strengthening  and activity tolerance     Exercises: reviewed all HEP issued from rehab/HH and here. Kept partial curl up in hook lying, seated ankle exercise with red band (issued last session) and advanced side lying clam shell to have red band resistance. Added the following to his HEP: "I love a Licensed conveyancer   At counter for balance as needed: high knee marching forward and then backward. 3 second pauses with each knee lift.  Repeat 3 laps each way. Do _1-2_ sessions per day. http://gt2.exer.us/345   Copyright  VHI. All rights reserved.  .  Walking on [employer] counter for balance: Walk on heels forward while continuing on a straight path, and then walk on heels backward to starting position. Repeat for 3 laps each way. Do _1-2__ sessions per day.  Copyright  VHI. All rights reserved.    Walking on Toes   hold counter for balance as needed: Walk on toes forward while continuing on a straight path, and then backwards on toes to starting position. Repeat 3 laps each way. Do _1-2___ sessions per day.  Copyright  VHI. All rights reserved.  Feet Heel-Toe "Tandem"   At counter: Arms at sides/holding onto counter top, walk a straight line forward bringing one foot directly in front of the other, and then a straight line backwards bringing one foot directly behind the other one.  Repeat for _3 laps each way. Do _1-2_ sessions per day.  Copyright  VHI. All rights reserved.         PT Short Term Goals - 10/12/15 1148    PT SHORT TERM GOAL #1   Title Pt will initiate HEP for BLE strengthening and balance in order to decrease fall risk and improve functional mobility.  (Target Date: 11/09/15)   Status New   PT SHORT TERM GOAL #2   Title Pt wil improve gait speed to 2.41 ft/sec in order to decrease fall risk and improve efficiency of gait.     Status New   PT SHORT TERM GOAL #3   Title Pt wil improve BERG balance score to 31/56 in order to demonstrate decreased fall risk.     PT SHORT TERM GOAL #4   Title Will assess and improve distance by 150' in order to demonstrate improved functional endurance.     Status New   PT SHORT TERM GOAL #5   Title Pt will ambulate >200' over indoor surfaces w/ SPC at S level in order to demonstrate increased independence with gait.             PT Long Term Goals - 10/12/15 1152    PT LONG TERM GOAL #1   Title Pt will be independent with HEP in order to demonstrate improved functional mobility and decreased fall risk.  (Target Date: 12/07/15)   Status New   PT LONG TERM GOAL #2   Title Pt will improve gait speed to >2.62 ft/sec in order to indicate pt is community ambulator.     Status New   PT LONG TERM GOAL #3   Title Pt will increase BERG balance score to 37/56 in order to indicate decreased fall risk.    Status New   PT  LONG TERM GOAL #4   Title Pt will verbalize particpation in walking program/return to community fitness in order to maintain and progress endurance.     Status New   PT LONG TERM GOAL #5   Title Pt will ambulate with SPC over outdoor surfaces (  paved and grass) at mod I level in order to demonstrate return to community negotiation.     Additional Long Term Goals   Additional Long Term Goals Yes   PT LONG TERM GOAL #6   Title Pt will ambulate up/down grassy hill with LRAD at S level in order to indicate return to [place] house/boat.     Status New            Plan - 10/19/15 [id number]    Clinical Impression Statement Today's session completed the 6 minute walk test to establish base line distance. Remainder of session addressed HEP, including pt returning to gym to use recumbant bike and walking track for walking program. Pt had no issues after using Nustep for 10 minutes. [facility], along with up to 10 minutes of walking on the track for working on activity tolerance. Pt's current HEP was addressed as well and advance to include more challenging exercises as he was reporting most of them getting too easy.                                        Rehab Potential Good   Clinical Impairments Affecting Rehab Potential co-morbidities   PT Frequency 2x / week   PT Duration 8 weeks   PT Treatment/Interventions ADLs/Self Care Home Management;Electrical Stimulation;DME Instruction;Gait training;Stair training;Functional mobility training;Therapeutic activities;Therapeutic exercise;Balance training;Neuromuscular re-education;Patient/family education;Orthotic Fit/Training;Energy conservation;Vestibular   PT Next Visit Plan gait with SPC   Consulted and Agree with Plan of Care Patient;Family member/caregiver   Family Member Consulted wife Junious DresserConnie      Patient will benefit from skilled therapeutic intervention in order to improve the following deficits and impairments:  Abnormal gait, Decreased  activity tolerance, Decreased balance, Decreased endurance, Decreased knowledge of precautions, Decreased mobility, Decreased safety awareness, Decreased strength, Difficulty walking, Dizziness, Impaired perceived functional ability, Impaired sensation, Improper body mechanics, Postural dysfunction  Visit Diagnosis: Other abnormalities of gait and mobility  Other lack of coordination  Muscle weakness (generalized)  Unsteadiness on feet  Other disturbances of skin sensation     Problem List Patient Active Problem List   Diagnosis Date Noted  . Benign essential HTN   . Irregular bowel habits   . Constipation   . Thrombocytopenia (HCC)   . Acute on chronic renal insufficiency (HCC)   . Normocytic anemia   . Type 2 diabetes mellitus with peripheral neuropathy (HCC)   . Bilateral leg weakness   . Idiopathic angioedema   . Leg weakness   . Back pain   . Hypomagnesemia   . Type 2 diabetes mellitus with diabetic neuropathy, unspecified (HCC)   . Guillain Barr syndrome (HCC)   . Weakness 09/17/2015  . Gait abnormality 09/17/2015  . Gait instability 09/17/2015  . Cellulitis and abscess 07/14/2015  . Preventative health care 11/08/2014  . Angio-edema 07/14/2014  . Angioedema 07/13/2014  . Left foot drop 04/05/2014  . Lumbar back pain with radiculopathy affecting left lower extremity 04/05/2014  . Atypical chest pain 12/14/2013  . Hypokalemia 03/16/2013  . Overweight (BMI 25.0-29.9) 09/15/2012  . Stage 3 chronic renal impairment associated with type 2 diabetes mellitus (HCC) 07/20/2010  . ULNAR NERVE ENTRAPMENT, RIGHT 06/07/2008  . Diabetes mellitus (HCC) 12/09/2007  . Essential hypertension, benign 12/08/2007  . DIASTASIS RECTI 12/08/2007  . HYPERLIPIDEMIA 06/26/2006  . ANEMIA, OTHER, UNSPECIFIED 06/26/2006  . Peripheral autonomic neuropathy due  to diabetes mellitus (HCC) 06/26/2006  . GASTROESOPHAGEAL REFLUX DISEASE, CHRONIC 06/26/2006  . Enlarged prostate with lower  urinary tract symptoms (LUTS) 06/26/2006    Sallyanne Kuster, PTA, [facility] [address] [place], [place] [address] [phone] 10/20/2015, 10:31 AM   Name: GABRIEN MENTINK MRN: [id number] Date of Birth: 1940/09/25

## 2015-10-24 ENCOUNTER — Ambulatory Visit: Payer: Medicare Other | Admitting: Physical Therapy

## 2015-10-24 ENCOUNTER — Ambulatory Visit: Payer: Medicare Other | Admitting: Occupational Therapy

## 2015-10-24 DIAGNOSIS — R278 Other lack of coordination: Secondary | ICD-10-CM

## 2015-10-24 DIAGNOSIS — R2689 Other abnormalities of gait and mobility: Secondary | ICD-10-CM

## 2015-10-24 DIAGNOSIS — R208 Other disturbances of skin sensation: Secondary | ICD-10-CM

## 2015-10-24 DIAGNOSIS — R2681 Unsteadiness on feet: Secondary | ICD-10-CM

## 2015-10-24 DIAGNOSIS — M6281 Muscle weakness (generalized): Secondary | ICD-10-CM

## 2015-10-24 NOTE — Therapy (Signed)
[facility] [address] [place], [place], [address] Phone: [phone]   Fax:  [phone]  Occupational Therapy Treatment  Patient Details  Name: Tanner Mason MRN: [id number] Date of Birth: May 03, 1940 Referring Provider: Dr. Alysia Penna  Encounter Date: 10/24/2015      OT End of Session - 10/24/15 1227    Visit Number 3   Number of Visits 4   Date for OT Re-Evaluation 11/13/15   Authorization Type UHC medicare - will need G code   Authorization Time Period 60 days   OT Start Time 1145   OT Stop Time 1215   OT Time Calculation (min) 30 min   Activity Tolerance Patient tolerated treatment well      Past Medical History  Diagnosis Date  . Elbow dislocation 9379,0240    Right  . Treadmill stress test negative for angina pectoris 06/2002    poor HR and BP recovery  . Abdominal ultrasound, abnormal 02/2004    fatty liver  . Encounter for diagnostic endoscopy 10/22/04    negative  . History of esophagogastroduodenoscopy 08/13/2007    normal  . Diabetes mellitus without complication [facility])     Past Surgical History  Procedure Laterality Date  . Inguinal hernia repair Right 1947  . Anterior cruciate ligament repair  5/98    Left, staph infection complicated  . Blepharoplasty  03/2005    with ptosis repair  . Basal cell carcinoma excision  02/2005    R ear  . Inguinal hernia repair Left 1970    There were no vitals filed for this visit.      Subjective Assessment - 10/24/15 1148    Subjective  My endurance seems better and I have no pain.    Patient is accompained by: Family member  wife   Pertinent History see epic snapshot   Currently in Pain? No/denies                      OT Treatments/Exercises (OP) - 10/24/15 1223    ADLs   Cooking Session focused on cooking at an ambulatory level without a device - emphasis on dynamic standing balance, safety and activity tolerance.   Pt independent with this activity and able to monitor fatigue independently as welll.  Pt tolerated approximately 20 minutes of activity before rest break - pt with change in gait pattern and became slightly more unsteady on feet with fatigue. Discussed signs with pt for needing brief rest break but using functional activity at home to address endurance as well. Pt verbalized understanding.  Reviwed driving info again at pt's request.                    OT Short Term Goals - 10/16/15 1056    OT SHORT TERM GOAL #1   Title --  n/a           OT Long Term Goals - 10/24/15 1226    OT LONG TERM GOAL #1   Title Pt will be mod I with HEP to address grip strength and coordination in RUE   Status Achieved   OT LONG TERM GOAL #2   Title Pt will verbalize understanding of return to driving and driving if eval if needed   Status Achieved   OT LONG TERM GOAL #3   Title Pt will be mod I with simple hot meal prep at ambulatiory level.   Status Achieved  Plan - 10/24/15 1226    Clinical Impression Statement Pt has met all goals and is ready for d/c.  Pt in agreement.   Rehab Potential Good   OT Frequency 2x / week   OT Duration 2 weeks   OT Treatment/Interventions Self-care/ADL training;Therapeutic exercise;Neuromuscular education;Therapist, nutritional;Therapeutic activities;Patient/family education;Balance training   Plan d/c from [place] and Agree with Plan of Care Patient;Family member/caregiver   Family Member Consulted wife      Patient will benefit from skilled therapeutic intervention in order to improve the following deficits and impairments:  Abnormal gait, Decreased activity tolerance, Decreased balance, Decreased coordination, Decreased strength, Impaired UE functional use, Pain  Visit Diagnosis: Other abnormalities of gait and mobility  Other lack of coordination  Muscle weakness (generalized)  Unsteadiness on feet    Problem  List Patient Active Problem List   Diagnosis Date Noted  . Benign essential HTN   . Irregular bowel habits   . Constipation   . Thrombocytopenia ([place])   . Acute on chronic renal insufficiency (HCC)   . Normocytic anemia   . Type 2 diabetes mellitus with peripheral neuropathy (HCC)   . Bilateral leg weakness   . Idiopathic angioedema   . Leg weakness   . Back pain   . Hypomagnesemia   . Type 2 diabetes mellitus with diabetic neuropathy, unspecified ([place])   . Guillain Barr syndrome ([place])   . Weakness 09/17/2015  . Gait abnormality 09/17/2015  . Gait instability 09/17/2015  . Cellulitis and abscess 07/14/2015  . Preventative health care 11/08/2014  . Angio-edema 07/14/2014  . Angioedema 07/13/2014  . Left foot drop 04/05/2014  . Lumbar back pain with radiculopathy affecting left lower extremity 04/05/2014  . Atypical chest pain 12/14/2013  . Hypokalemia 03/16/2013  . Overweight (BMI 25.0-29.9) 09/15/2012  . Stage 3 chronic renal impairment associated with type 2 diabetes mellitus ([place]) 07/20/2010  . ULNAR NERVE ENTRAPMENT, RIGHT 06/07/2008  . Diabetes mellitus ([place]) 12/09/2007  . Essential hypertension, benign 12/08/2007  . DIASTASIS RECTI 12/08/2007  . HYPERLIPIDEMIA 06/26/2006  . ANEMIA, OTHER, UNSPECIFIED 06/26/2006  . Peripheral autonomic neuropathy due to diabetes mellitus ([place]) 06/26/2006  . GASTROESOPHAGEAL REFLUX DISEASE, CHRONIC 06/26/2006  . Enlarged prostate with lower urinary tract symptoms (LUTS) 06/26/2006   OCCUPATIONAL THERAPY DISCHARGE SUMMARY  Visits from Start of Care: 3  Current functional level related to goals / functional outcomes: See above   Remaining deficits: High level balance, activity tolerance   Education / Equipment: HEP, driving info Plan: Patient agrees to discharge.  Patient goals were met. Patient is being discharged due to meeting the stated rehab goals.  ?????       Quay Burow, OTR/L 10/24/2015, 12:28  PM  [facility] [address] [place], [place], [address] Phone: [phone]   Fax:  [phone]  Name: Tanner Mason MRN: [id number] Date of Birth: 03/31/1941

## 2015-10-24 NOTE — Therapy (Signed)
[facility] [address] [place], [place], [address] Phone: [phone]   Fax:  [phone]  Physical Therapy Treatment  Patient Details  Name: Tanner Mason MRN: [id number] Date of Birth: February 10, 1941 Referring Provider: Maryla Morrow, MD  Encounter Date: 10/24/2015      PT End of Session - 10/24/15 1145    Visit Number 4   Number of Visits 17   Date for PT Re-Evaluation 12/11/15   Authorization Type UHC MDC-G Code on every 10th visit   PT Start Time 1105   PT Stop Time 1145   PT Time Calculation (min) 40 min   Equipment Utilized During Treatment Gait belt   Activity Tolerance Patient tolerated treatment well   Behavior During Therapy [facility] for tasks assessed/performed      Past Medical History  Diagnosis Date  . Elbow dislocation [id number]    Right  . Treadmill stress test negative for angina pectoris 06/2002    poor HR and BP recovery  . Abdominal ultrasound, abnormal 02/2004    fatty liver  . Encounter for diagnostic endoscopy 10/22/04    negative  . History of esophagogastroduodenoscopy 08/13/2007    normal  . Diabetes mellitus without complication [facility])     Past Surgical History  Procedure Laterality Date  . Inguinal hernia repair Right 1947  . Anterior cruciate ligament repair  5/98    Left, staph infection complicated  . Blepharoplasty  03/2005    with ptosis repair  . Basal cell carcinoma excision  02/2005    R ear  . Inguinal hernia repair Left 1970    There were no vitals filed for this visit.      Subjective Assessment - 10/24/15 1105    Subjective No new complaints. No falls or pain to report. Patient stated that he walked the track at the [facility] and rode the stationary bike for 10 minutes each. He said he felt great walking on the track at first, but after about 8 minutes it got really challenging.   Patient is accompained by: Family member   Pertinent History DM with peripheral  neruropathy, hx of LBP w/ radiculopathy   Limitations Walking;House hold activities;Standing   Patient Stated Goals To walk again normally [place]    Currently in Pain? No/denies   Pain Score 0-No pain           [facility] Adult PT Treatment/Exercise - 10/24/15 1105    Transfers   Transfers Sit to Stand;Stand to Sit   Sit to Stand 5: Supervision;With upper extremity assist;From bed   Sit to Stand Details Verbal cues for safe use of DME/AE   Sit to Stand Details (indicate cue type and reason) cues for standing with cane   Stand to Sit 5: Supervision;With upper extremity assist;To bed   Stand to Sit Details (indicate cue type and reason) Verbal cues for safe use of DME/AE   Stand to Sit Details cues for cane placement with sitting down   Ambulation/Gait   Ambulation/Gait Yes   Ambulation/Gait Assistance 4: Min guard;4: Min assist;5: Supervision   min guard/assist w/ and w/o SPC   Ambulation/Gait Assistance Details occasional cues on cane placement with gait, posture.    Ambulation Distance (Feet) 150 Feet  without AD, remainder of gait with straight cane (see below)   Assistive device Straight cane;Rollator   Gait Pattern Step-through pattern;Decreased stride length;Decreased arm swing - right;Narrow base of support   Ambulation  Surface Level;Grass;Gravel;Indoor;Outdoor;Unlevel;Paved   Stairs Yes   Stairs Assistance 6: Modified independent (Device/Increase time);4: Min assist;4: Min guard   Stairs Assistance Details (indicate cue type and reason) 4 stairs with no rails/cane using reciprocal pattern needed min assist. Had pt adjust to using cane and simulated door frame with 4 stairs with 1st: reciprocal pattern needing min guard assist, then had pt change to step to pattern with only supervision needed. Ended with using of straight cane only with step to pattern with min guard progressing to supervision assist .                                   Stair  Management Technique One rail Left;No rails;One rail Right;Step to pattern;Alternating pattern;With cane   Number of Stairs 4  blocked practice   Height of Stairs 6   Ramp Other (comment);5: Supervision  min guard assist   Ramp Details (indicate cue type and reason) min guard for safety, no cues or assist needed using straight cane   Curb Other (comment);5: Supervision  min guard assist   Curb Details (indicate cue type and reason) min guard for safety, no physical assist needed, reinforcement cues of sequencing provided   Gait Comments Focused on gait today mostly with straight cane on varied surfaces (indoor level, outdoor grass, gravel and paved). Pt needed supervision on indoor surfaces, min guard for safety on outdoor surfaces. Does have decreased gait speed with outdoor surfaces vs indoor, however no instability was noted. Briefly worked on gait without AD (~150 feet total) on indoor level surfaces, including negotiating around furniture and barriers with supervision assist. No balance issues or instability noted.                                                                                   PT Education - 10/24/15 1352    Education provided Yes   Education Details okay to ambulate in home without AD with family supervision; okay to use straight cane for short distances in community in familiar enviroments only;use of rollator with all other community gait and with walking program for HEP   Person(s) Educated Patient;Spouse;Child(ren)   Methods Explanation;Demonstration   Comprehension Verbalized understanding;Returned demonstration             PT Short Term Goals - 10/12/15 1148    PT SHORT TERM GOAL #1   Title Pt will initiate HEP for BLE strengthening and balance in order to decrease fall risk and improve functional mobility.  (Target Date: 11/09/15)   Status New   PT SHORT TERM GOAL #2   Title Pt wil improve gait speed to 2.41 ft/sec in order to decrease fall risk and  improve efficiency of gait.     Status New   PT SHORT TERM GOAL #3   Title Pt wil improve BERG balance score to 31/56 in order to demonstrate decreased fall risk.     PT SHORT TERM GOAL #4   Title Will assess and improve distance by 150' in order to demonstrate improved functional endurance.     Status New   PT SHORT  TERM GOAL #5   Title Pt will ambulate >200' over indoor surfaces w/ SPC at S level in order to demonstrate increased independence with gait.             PT Long Term Goals - 10/12/15 1152    PT LONG TERM GOAL #1   Title Pt will be independent with HEP in order to demonstrate improved functional mobility and decreased fall risk.  (Target Date: 12/07/15)   Status New   PT LONG TERM GOAL #2   Title Pt will improve gait speed to >2.62 ft/sec in order to indicate pt is community ambulator.     Status New   PT LONG TERM GOAL #3   Title Pt will increase BERG balance score to 37/56 in order to indicate decreased fall risk.    Status New   PT LONG TERM GOAL #4   Title Pt will verbalize particpation in walking program/return to community fitness in order to maintain and progress endurance.     Status New   PT LONG TERM GOAL #5   Title Pt will ambulate with SPC over outdoor surfaces (paved and grass) at mod I level in order to demonstrate return to community negotiation.     Additional Long Term Goals   Additional Long Term Goals Yes   PT LONG TERM GOAL #6   Title Pt will ambulate up/down grassy hill with LRAD at S level in order to indicate [place] to [place] house[place].     Status New           Plan - 10/24/15 1224    Clinical Impression Statement Today's session focused on progression of gait with cane and without AD. Patient is progressing well with gait, especially on indoor surfaces and outdoor paved surfaces.  He was able to walk with little fatigue on multiple surfaces in clinic today. He is ambulating within his home without the use of an assistive device, so this  was checked today and he is safe for short in home distances without AD with family supervision.Marland Kitchen. He is compliant with HEP. He should benefit from continued skilled PT to address ongoing goals.   Rehab Potential Good   Clinical Impairments Affecting Rehab Potential co-morbidities   PT Frequency 2x / week   PT Duration 8 weeks   PT Treatment/Interventions ADLs/Self Care Home Management;Electrical Stimulation;DME Instruction;Gait training;Stair training;Functional mobility training;Therapeutic activities;Therapeutic exercise;Balance training;Neuromuscular re-education;Patient/family education;Orthotic Fit/Training;Energy conservation;Vestibular   PT Next Visit Plan gait with SPC on oudoor surfaces, dynamic gait/balance on compliant surfaces   Consulted and Agree with Plan of Care Patient;Family member/caregiver   Family Member Consulted wife Tanner Mason         Patient will benefit from skilled therapeutic intervention in order to improve the following deficits and impairments:  Abnormal gait, Decreased activity tolerance, Decreased balance, Decreased endurance, Decreased knowledge of precautions, Decreased mobility, Decreased safety awareness, Decreased strength, Difficulty walking, Dizziness, Impaired perceived functional ability, Impaired sensation, Improper body mechanics, Postural dysfunction  Visit Diagnosis: Other abnormalities of gait and mobility  Muscle weakness (generalized)  Other disturbances of skin sensation  Unsteadiness on feet     Problem List Patient Active Problem List   Diagnosis Date Noted  . Benign essential HTN   . Irregular bowel habits   . Constipation   . Thrombocytopenia (HCC)   . Acute on chronic renal insufficiency (HCC)   . Normocytic anemia   . Type 2 diabetes mellitus with peripheral neuropathy (HCC)   . Bilateral leg weakness   .  Idiopathic angioedema   . Leg weakness   . Back pain   . Hypomagnesemia   . Type 2 diabetes mellitus with diabetic  neuropathy, unspecified (HCC)   . Guillain Barr syndrome (HCC)   . Weakness 09/17/2015  . Gait abnormality 09/17/2015  . Gait instability 09/17/2015  . Cellulitis and abscess 07/14/2015  . Preventative health care 11/08/2014  . Angio-edema 07/14/2014  . Angioedema 07/13/2014  . Left foot drop 04/05/2014  . Lumbar back pain with radiculopathy affecting left lower extremity 04/05/2014  . Atypical chest pain 12/14/2013  . Hypokalemia 03/16/2013  . Overweight (BMI 25.0-29.9) 09/15/2012  . Stage 3 chronic renal impairment associated with type 2 diabetes mellitus (HCC) 07/20/2010  . ULNAR NERVE ENTRAPMENT, RIGHT 06/07/2008  . Diabetes mellitus (HCC) 12/09/2007  . Essential hypertension, benign 12/08/2007  . DIASTASIS RECTI 12/08/2007  . HYPERLIPIDEMIA 06/26/2006  . ANEMIA, OTHER, UNSPECIFIED 06/26/2006  . Peripheral autonomic neuropathy due to diabetes mellitus (HCC) 06/26/2006  . GASTROESOPHAGEAL REFLUX DISEASE, CHRONIC 06/26/2006  . Enlarged prostate with lower urinary tract symptoms (LUTS) 06/26/2006    Myer HaffJosef Jsoeph Podesta, SPTA 10/24/2015, 12:34 PM  [facility] [address] [place], [place], [address] Phone: [phone]   Fax:  [phone]  Name: Tanner Mason MRN: [id number] Date of Birth: 17-Feb-1941  This note has been reviewed and edited by supervising CI.  Sallyanne KusterKathy Bury, PTA, [facility] [address], [address] [place], [place] [address] [phone] 10/24/2015, 1:57 PM

## 2015-10-25 ENCOUNTER — Other Ambulatory Visit: Payer: Self-pay | Admitting: Family Medicine

## 2015-10-26 ENCOUNTER — Encounter: Payer: Self-pay | Admitting: Family Medicine

## 2015-10-26 ENCOUNTER — Ambulatory Visit: Payer: Medicare Other | Admitting: Physical Therapy

## 2015-10-26 ENCOUNTER — Ambulatory Visit (INDEPENDENT_AMBULATORY_CARE_PROVIDER_SITE_OTHER): Payer: Medicare Other | Admitting: Family Medicine

## 2015-10-26 ENCOUNTER — Encounter: Payer: Medicare Other | Admitting: Occupational Therapy

## 2015-10-26 VITALS — BP 116/59 | HR 84 | Temp 97.7°F | Ht 67.0 in | Wt 156.0 lb

## 2015-10-26 DIAGNOSIS — R2689 Other abnormalities of gait and mobility: Secondary | ICD-10-CM

## 2015-10-26 DIAGNOSIS — I10 Essential (primary) hypertension: Secondary | ICD-10-CM

## 2015-10-26 DIAGNOSIS — G610 Guillain-Barre syndrome: Secondary | ICD-10-CM

## 2015-10-26 DIAGNOSIS — R208 Other disturbances of skin sensation: Secondary | ICD-10-CM

## 2015-10-26 DIAGNOSIS — R2681 Unsteadiness on feet: Secondary | ICD-10-CM

## 2015-10-26 DIAGNOSIS — N183 Chronic kidney disease, stage 3 (moderate): Secondary | ICD-10-CM

## 2015-10-26 DIAGNOSIS — R278 Other lack of coordination: Secondary | ICD-10-CM

## 2015-10-26 DIAGNOSIS — E1122 Type 2 diabetes mellitus with diabetic chronic kidney disease: Secondary | ICD-10-CM | POA: Diagnosis not present

## 2015-10-26 DIAGNOSIS — M6281 Muscle weakness (generalized): Secondary | ICD-10-CM

## 2015-10-26 DIAGNOSIS — Z794 Long term (current) use of insulin: Secondary | ICD-10-CM

## 2015-10-26 DIAGNOSIS — E0821 Diabetes mellitus due to underlying condition with diabetic nephropathy: Secondary | ICD-10-CM

## 2015-10-26 NOTE — Assessment & Plan Note (Signed)
Controlled. -continue Norvasc, Metoprolol, and HCTZ

## 2015-10-26 NOTE — Assessment & Plan Note (Signed)
Cr Stable based on recent lab results. -routine follow up with Nephrology -control HTN and DM

## 2015-10-26 NOTE — Assessment & Plan Note (Signed)
Controlled. A1C 7.4 in May. -continue Lantus 40 units daily, Humulog 5 Units with meals, and Victoza -up to date on eye and foot exams

## 2015-10-26 NOTE — Progress Notes (Signed)
   Subjective:    Patient ID: Tanner Mason, male    DOB: 02/14/1941, [age] y.o.   MRN: [id number]  HPI 75 y/o male [facility] 2017 with acute LE weakness. Admitted to [facility] from 5/21-5/30 with Alene MiresGuillain Barre. Transferred to Inpatient rehab from 5/30-6/12. Reviewed Inpatient Rehab Discharge summary.   Guillain Barre - gradually improving (patient reports 85% improvement in symptoms), completed 5 session of plasmapharesis, was in Inpatient rehab for 2 weeks (came home on 6/12) Has completed OT, working with PT (moving to cane from rolling walker). Patient would like to know when he can return to driving.   CKD 3 - has upcoming appointment with Dr. Lowell GuitarPowell next week  HTN - taking Amlodipine 5 mg daily (decreased from 10 mg while in Inpatient Rehab), Metoprolol, and HCTZ 25 mg daily,  had one episode of low BP last week in PT, no lightheadedness, no chest pain  DM - lantus decreased from 50 to 40 while hospitalized, taking 5 units Humolog daily, Victoza 1.8 mg daily, AM sugars 100's to low 200's, preprandial in the 100's, Up to date on foot and eye exams, can not exercise regularly due to [facility]illain [place]re  Social - nonsmoker, lives with wift   Review of Systems  Constitutional: Negative for fever, chills and fatigue.  Respiratory: Negative for cough and shortness of breath.   Gastrointestinal: Negative for nausea, vomiting and diarrhea.  Neurological: Positive for weakness.       Objective:   Physical Exam BP 116/59 mmHg  Pulse 84  Temp(Src) 97.7 F (36.5 C) (Oral)  Ht 5\' 7"  (1.702 m)  Wt 156 lb (70.761 kg)  BMI 24.43 kg/m2 WUJ:[id number]:pleasant male, NAD Cardiac: RRR, S1 and S2 present, no murmur Resp:CTAB, normal effort Neuro: CN 2-12 intact, strength 5/5 BUE, strength B Hip flexion/leg extension 5/5, plantarflexion strength 4/5, sensation to light touch intact, gait - wide based, loses balance when walking without assistive device  A1C 5/17 was 7.4 Reviewed  labs from past month     Assessment & Plan:  Reyes IvanGuillain Barr syndrome [facility]. Symptoms gradually improving. -continue home PT -patient transitioning from wheeled walker to cane - patient wishes to drive, not cleared from my standpoint at this time, will contact DMV and see if they can offer driving test to evaluate ability to drive  Diabetes mellitus (HCC) Controlled. A1C 7.4 in May. -continue Lantus 40 units daily, Humulog 5 Units with meals, and Victoza -up to date on eye and foot exams  Essential hypertension, benign Controlled. -continue Norvasc, Metoprolol, and HCTZ  Stage 3 chronic renal impairment associated with type 2 diabetes mellitus (HCC) Cr Stable based on recent lab results. -routine follow up with Nephrology -control HTN and DM

## 2015-10-26 NOTE — Assessment & Plan Note (Signed)
[facility]. Symptoms gradually improving. -continue home PT -patient transitioning from wheeled walker to cane - patient wishes to drive, not cleared from my standpoint at this time, will contact DMV and see if they can offer driving test to evaluate ability to drive

## 2015-10-26 NOTE — Therapy (Signed)
[facility] [address] [place], [place], [address] Phone: [phone]   Fax:  [phone]  Physical Therapy Treatment  Patient Details  Name: Tanner RueCharles A Mason MRN: [id number] Date of Birth: 1941/01/30 Referring Provider: Maryla MorrowAnkit Patel, MD  Encounter Date: 10/26/2015      PT End of Session - 10/26/15 0941    Visit Number 5   Number of Visits 17   Date for PT Re-Evaluation 12/11/15   Authorization Type UHC MDC-G Code on every 10th visit   PT Start Time 0845   PT Stop Time 0933   PT Time Calculation (min) 48 min   Equipment Utilized During Treatment Gait belt   Activity Tolerance Patient tolerated treatment well   Behavior During Therapy [facility] for tasks assessed/performed      Past Medical History  Diagnosis Date  . Elbow dislocation [id number]    Right  . Treadmill stress test negative for angina pectoris 06/2002    poor HR and BP recovery  . Abdominal ultrasound, abnormal 02/2004    fatty liver  . Encounter for diagnostic endoscopy 10/22/04    negative  . History of esophagogastroduodenoscopy 08/13/2007    normal  . Diabetes mellitus without complication [facility](HCC)     Past Surgical History  Procedure Laterality Date  . Inguinal hernia repair Right 1947  . Anterior cruciate ligament repair  5/98    Left, staph infection complicated  . Blepharoplasty  03/2005    with ptosis repair  . Basal cell carcinoma excision  02/2005    R ear  . Inguinal hernia repair Left 1970    There were no vitals filed for this visit.      Subjective Assessment - 10/26/15 0848    Subjective Pt is going to the [employer]  2-3x/week; states that endurance is improving walking and spending more time on the seated recumbent bike 10-12 min each.   Currently in Pain? No/denies            [facility] PT Assessment - 10/26/15 0001    6 minute walk test results    Aerobic Endurance Distance Walked 870   Endurance additional comments SPC no  rest breaks LOBx1 with right toe catching, pt self corrected                     [facility] Adult PT Treatment/Exercise - 10/26/15 0001    Ambulation/Gait   Ambulation/Gait Yes   Ambulation/Gait Assistance 5: Supervision   Ambulation/Gait Assistance Details worked on sequencing with SPC and safe placement progressing with changes in direction, speed, and visual scanning tasks  Good carry over for technique   Ambulation Distance (Feet) 300 Feet x2   Assistive device Straight cane   Gait Pattern Step-to pattern;Decreased arm swing - right   Ambulation Surface Level;Unlevel;Indoor;Outdoor;Paved;Grass   Stairs Yes   Stairs Assistance 6: Modified independent (Device/Increase time);4: Min assist;4: Min guard   Stairs Assistance Details (indicate cue type and reason) 4 x3   Stair Management Technique One rail Right;With cane;Alternating pattern   Ramp 5: Supervision  with cane cues for technique   Curb 4: Min assist  with SPC cues for technique                PT Education - 10/26/15 0925    Education provided Yes   Education Details Pt safe to use [facility] for all ambulation; recommend close supervision with SPC on unlevel grassy surfaces.   Person(s) Educated Patient;Spouse   Methods  Explanation   Comprehension Verbalized understanding          PT Short Term Goals - 10/12/15 1148    PT SHORT TERM GOAL #1   Title Pt will initiate HEP for BLE strengthening and balance in order to decrease fall risk and improve functional mobility.  (Target Date: 11/09/15)   Status New   PT SHORT TERM GOAL #2   Title Pt wil improve gait speed to 2.41 ft/sec in order to decrease fall risk and improve efficiency of gait.     Status New   PT SHORT TERM GOAL #3   Title Pt wil improve BERG balance score to 31/56 in order to demonstrate decreased fall risk.     PT SHORT TERM GOAL #4   Title Will assess 6MWT and improve distance by 150' in order to demonstrate improved functional endurance.      Status New   PT SHORT TERM GOAL #5   Title Pt will ambulate >200' over indoor surfaces w/ SPC at S level in order to demonstrate increased independence with gait.             PT Long Term Goals - 10/12/15 1152    PT LONG TERM GOAL #1   Title Pt will be independent with HEP in order to demonstrate improved functional mobility and decreased fall risk.  (Target Date: 12/07/15)   Status New   PT LONG TERM GOAL #2   Title Pt will improve gait speed to >2.62 ft/sec in order to indicate pt is community ambulator.     Status New   PT LONG TERM GOAL #3   Title Pt will increase BERG balance score to 37/56 in order to indicate decreased fall risk.    Status New   PT LONG TERM GOAL #4   Title Pt will verbalize particpation in walking program/return to community fitness in order to maintain and progress endurance.     Status New   PT LONG TERM GOAL #5   Title Pt will ambulate with SPC over outdoor surfaces (paved and grass) at mod I level in order to demonstrate return to community negotiation.     Additional Long Term Goals   Additional Long Term Goals Yes   PT LONG TERM GOAL #6   Title Pt will ambulate up/down grassy hill with LRAD at S level in order to indicate [place].     Status New               Plan - 10/26/15 [id number]    Clinical Impression Statement Pt had about the same distance for 6MWT with SPC vs rollator at supervision level. Pt had LOB x1 at end of walk which he self-corrected due to Right foot catching on floor. Demonstrated carryover with safe SPC technique for dynamic/communtiy type gait.   Rehab Potential Good   Clinical Impairments Affecting Rehab Potential co-morbidities   PT Frequency 2x / week   PT Duration 8 weeks   PT Treatment/Interventions ADLs/Self Care Home Management;Electrical Stimulation;DME Instruction;Gait training;Stair training;Functional mobility training;Therapeutic activities;Therapeutic exercise;Balance training;Neuromuscular  re-education;Patient/family education;Orthotic Fit/Training;Energy conservation;Vestibular   PT Next Visit Plan gait with SPC on oudoor surfaces, dynamic gait/balance on compliant surfaces   Consulted and Agree with Plan of Care Patient;Family member/caregiver   Family Member Consulted wife Junious DresserConnie      Patient will benefit from skilled therapeutic intervention in order to improve the following deficits and impairments:  Abnormal gait, Decreased activity tolerance, Decreased balance, Decreased endurance, Decreased knowledge  of precautions, Decreased mobility, Decreased safety awareness, Decreased strength, Difficulty walking, Dizziness, Impaired perceived functional ability, Impaired sensation, Improper body mechanics, Postural dysfunction  Visit Diagnosis: Other abnormalities of gait and mobility  Other lack of coordination  Muscle weakness (generalized)  Unsteadiness on feet  Other disturbances of skin sensation  Guillain Barr syndrome [facility])     Problem List Patient Active Problem List   Diagnosis Date Noted  . Benign essential HTN   . Irregular bowel habits   . Constipation   . Thrombocytopenia (HCC)   . Acute on chronic renal insufficiency (HCC)   . Normocytic anemia   . Type 2 diabetes mellitus with peripheral neuropathy (HCC)   . Bilateral leg weakness   . Idiopathic angioedema   . Leg weakness   . Back pain   . Hypomagnesemia   . Type 2 diabetes mellitus with diabetic neuropathy, unspecified (HCC)   . Guillain Barr syndrome (HCC)   . Weakness 09/17/2015  . Gait abnormality 09/17/2015  . Gait instability 09/17/2015  . Cellulitis and abscess 07/14/2015  . Preventative health care 11/08/2014  . Angio-edema 07/14/2014  . Angioedema 07/13/2014  . Left foot drop 04/05/2014  . Lumbar back pain with radiculopathy affecting left lower extremity 04/05/2014  . Atypical chest pain 12/14/2013  . Hypokalemia 03/16/2013  . Overweight (BMI 25.0-29.9) 09/15/2012  . Stage  3 chronic renal impairment associated with type 2 diabetes mellitus (HCC) 07/20/2010  . ULNAR NERVE ENTRAPMENT, RIGHT 06/07/2008  . Diabetes mellitus (HCC) 12/09/2007  . Essential hypertension, benign 12/08/2007  . DIASTASIS RECTI 12/08/2007  . HYPERLIPIDEMIA 06/26/2006  . ANEMIA, OTHER, UNSPECIFIED 06/26/2006  . Peripheral autonomic neuropathy due to diabetes mellitus (HCC) 06/26/2006  . GASTROESOPHAGEAL REFLUX DISEASE, CHRONIC 06/26/2006  . Enlarged prostate with lower urinary tract symptoms (LUTS) 06/26/2006    Hortencia Conradi, PTA  10/26/2015, 9:55 AM [facility] [address] [place], [place], [address] Phone: [phone]   Fax:  [phone]  Name: JAHID WEIDA MRN: [id number] Date of Birth: 05/22/1940

## 2015-10-26 NOTE — Patient Instructions (Signed)
It was nice to see you today.   Hypertension - continue Metoprolol, HCTZ, and Amlodipine  Kidney Disease - stable, follow up with Dr. Lowell GuitarPowell  Diabetes - continue lantus 40 units daily, Victoza daily, and Humolog 5 units with meals  Dr. Randolm IdolFletke will call you about the driving test.

## 2015-10-27 ENCOUNTER — Telehealth: Payer: Self-pay | Admitting: Family Medicine

## 2015-10-27 NOTE — Telephone Encounter (Signed)
55 Grove AvenueCyndee Crompton Cedric FishmanOTR/L, CDRS [place], [place] [phone]  Patient to call to schedule appointment for driving test.

## 2015-10-27 NOTE — Telephone Encounter (Signed)
Called to give patient information for [employer] ([employer])

## 2015-11-02 ENCOUNTER — Encounter: Payer: Medicare Other | Admitting: Occupational Therapy

## 2015-11-02 ENCOUNTER — Ambulatory Visit: Payer: Medicare Other | Attending: Physical Medicine & Rehabilitation

## 2015-11-02 DIAGNOSIS — M6281 Muscle weakness (generalized): Secondary | ICD-10-CM

## 2015-11-02 DIAGNOSIS — R2681 Unsteadiness on feet: Secondary | ICD-10-CM | POA: Insufficient documentation

## 2015-11-02 DIAGNOSIS — R208 Other disturbances of skin sensation: Secondary | ICD-10-CM | POA: Diagnosis present

## 2015-11-02 DIAGNOSIS — R278 Other lack of coordination: Secondary | ICD-10-CM | POA: Diagnosis present

## 2015-11-02 DIAGNOSIS — R2689 Other abnormalities of gait and mobility: Secondary | ICD-10-CM

## 2015-11-02 NOTE — Therapy (Signed)
[facility] [address] [place], [place], [address] Phone: [phone]   Fax:  [phone]  Physical Therapy Treatment  Patient Details  Name: Tanner Mason MRN: [id number] Date of Birth: 1940/09/01 Referring Provider: Maryla Morrow, MD  Encounter Date: 11/02/2015      PT End of Session - 11/02/15 2001    Visit Number 6   Number of Visits 17   Date for PT Re-Evaluation 12/11/15   Authorization Type UHC MDC-G Code on every 10th visit   PT Start Time 1404   PT Stop Time 1444   PT Time Calculation (min) 40 min   Equipment Utilized During Treatment Gait belt   Activity Tolerance Patient tolerated treatment well   Behavior During Therapy [facility] for tasks assessed/performed      Past Medical History  Diagnosis Date  . Elbow dislocation 8413,2440    Right  . Treadmill stress test negative for angina pectoris 06/2002    poor HR and BP recovery  . Abdominal ultrasound, abnormal 02/2004    fatty liver  . Encounter for diagnostic endoscopy 10/22/04    negative  . History of esophagogastroduodenoscopy 08/13/2007    normal  . Diabetes mellitus without complication [facility])     Past Surgical History  Procedure Laterality Date  . Inguinal hernia repair Right 1947  . Anterior cruciate ligament repair  5/98    Left, staph infection complicated  . Blepharoplasty  03/2005    with ptosis repair  . Basal cell carcinoma excision  02/2005    R ear  . Inguinal hernia repair Left 1970    There were no vitals filed for this visit.      Subjective Assessment - 11/02/15 1406    Subjective Pt denied falls or changes since last session. [place] over the [place] and went up and down the hill and kayaking. Pt reports he feels tired today.   Patient is accompained by: Family member   Pertinent History DM with peripheral neruropathy, hx of LBP w/ radiculopathy   Patient Stated Goals To walk again normally and go [place] the  [place] ([place]    Currently in Pain? No/denies                         [facility] Adult PT Treatment/Exercise - 11/02/15 1412    Ambulation/Gait   Ambulation/Gait Yes   Ambulation/Gait Assistance 5: Supervision   Ambulation/Gait Assistance Details Pt amb. indoors and outdoors with cues to improve L heel strike, stride length, and sequencing over grassy terrain.    Ambulation Distance (Feet) 450 Feet  outdoors and 200' indoors   Assistive device Straight cane   Gait Pattern Decreased arm swing - right;Step-through pattern;Decreased stride length;Decreased dorsiflexion - left   Ambulation Surface Level;Unlevel;Indoor;Outdoor;Paved;Gravel;Grass;Other (comment)  mulch             Balance Exercises - 11/02/15 1954    Balance Exercises: Standing   Wall Bumps Hip   Wall Bumps-Hips Eyes opened;5 reps;Anterior/posterior  performed 2/2 decr. ant hip muscle activation on rockerboard   Rockerboard Anterior/posterior;Lateral;Head turns;EO;EC;10 seconds;30 seconds;10 reps  no UE support.   Other Standing Exercises In // bars with no UE support: pt performed SLS with contralat. LE toe taps on 4" step x2 reps/LE. Performed at mat on compliant and non-compliant surfaces: single and double cone taps 2x4cones/LE, cues to improve lat. wt.shifting. All performed with min guard to min A to  maintain balance.            PT Education - 11/02/15 1957    Education provided Yes   Education Details PT reiterated the importance of always using SPC durinig amb. for safety. PT also educated pt that fatigue was normal after lake trip, but to inform MD regarding if cognitive issues persist (as pt reported some difficulty playing a card game during trip). Pt asked about driving again, as pt received contact info for OT that performs driving assessments upon d/c from OPOT neuro. PT encouraged pt to discuss driving clearance with MD.   [place] JettyPerson(s) Educated Spouse;Patient   Methods  Explanation   Comprehension Verbalized understanding          PT Short Term Goals - 10/12/15 1148    PT SHORT TERM GOAL #1   Title Pt will initiate HEP for BLE strengthening and balance in order to decrease fall risk and improve functional mobility.  (Target Date: 11/09/15)   Status New   PT SHORT TERM GOAL #2   Title Pt wil improve gait speed to 2.41 ft/sec in order to decrease fall risk and improve efficiency of gait.     Status New   PT SHORT TERM GOAL #3   Title Pt wil improve BERG balance score to 31/56 in order to demonstrate decreased fall risk.     PT SHORT TERM GOAL #4   Title Will assess 6MWT and improve distance by 150' in order to demonstrate improved functional endurance.     Status New   PT SHORT TERM GOAL #5   Title Pt will ambulate >200' over indoor surfaces w/ SPC at S level in order to demonstrate increased independence with gait.             PT Long Term Goals - 10/12/15 1152    PT LONG TERM GOAL #1   Title Pt will be independent with HEP in order to demonstrate improved functional mobility and decreased fall risk.  (Target Date: 12/07/15)   Status New   PT LONG TERM GOAL #2   Title Pt will improve gait speed to >2.62 ft/sec in order to indicate pt is community ambulator.     Status New   PT LONG TERM GOAL #3   Title Pt will increase BERG balance score to 37/56 in order to indicate decreased fall risk.    Status New   PT LONG TERM GOAL #4   Title Pt will verbalize particpation in walking program/return to community fitness in order to maintain and progress endurance.     Status New   PT LONG TERM GOAL #5   Title Pt will ambulate with SPC over outdoor surfaces (paved and grass) at mod I level in order to demonstrate return to community negotiation.     Additional Long Term Goals   Additional Long Term Goals Yes   PT LONG TERM GOAL #6   Title Pt will ambulate up/down grassy hill with LRAD at S level in order to indicate return to lake house/boat.      Status New               Plan - 11/02/15 2001    Clinical Impression Statement Pt demonstrated progress, as he was able to perform high level balance activities with min guard to min A. Pt had difficulty activating ant.hip musculature during rockerboard activities, but improved after performing wall bumps. Pt continues to experience incr. in gait deviations after amb. long distances, most notably decr.  L toe clearance. Pt would continue to benefit from skilled PT to improve safety during functional mobility.   Rehab Potential Good   Clinical Impairments Affecting Rehab Potential co-morbidities   PT Frequency 2x / week   PT Duration 8 weeks   PT Treatment/Interventions ADLs/Self Care Home Management;Electrical Stimulation;DME Instruction;Gait training;Stair training;Functional mobility training;Therapeutic activities;Therapeutic exercise;Balance training;Neuromuscular re-education;Patient/family education;Orthotic Fit/Training;Energy conservation;Vestibular   PT Next Visit Plan Continue gait with SPC on oudoor surfaces, dynamic gait/balance on compliant surfaces and bosu ball. Ant. wall bumps.   Consulted and Agree with Plan of Care Patient;Family member/caregiver   Family Member Consulted wife Junious DresserConnie      Patient will benefit from skilled therapeutic intervention in order to improve the following deficits and impairments:  Abnormal gait, Decreased activity tolerance, Decreased balance, Decreased endurance, Decreased knowledge of precautions, Decreased mobility, Decreased safety awareness, Decreased strength, Difficulty walking, Dizziness, Impaired perceived functional ability, Impaired sensation, Improper body mechanics, Postural dysfunction  Visit Diagnosis: Other abnormalities of gait and mobility  Muscle weakness (generalized)  Other lack of coordination     Problem List Patient Active Problem List   Diagnosis Date Noted  . Irregular bowel habits   . Constipation   .  Thrombocytopenia (HCC)   . Acute on chronic renal insufficiency (HCC)   . Normocytic anemia   . Idiopathic angioedema   . Back pain   . Hypomagnesemia   . Type 2 diabetes mellitus with diabetic neuropathy, unspecified (HCC)   . Guillain Barr syndrome (HCC)   . Weakness 09/17/2015  . Gait abnormality 09/17/2015  . Gait instability 09/17/2015  . Cellulitis and abscess 07/14/2015  . Preventative health care 11/08/2014  . Angio-edema 07/14/2014  . Angioedema 07/13/2014  . Left foot drop 04/05/2014  . Lumbar back pain with radiculopathy affecting left lower extremity 04/05/2014  . Atypical chest pain 12/14/2013  . Hypokalemia 03/16/2013  . Overweight (BMI 25.0-29.9) 09/15/2012  . Stage 3 chronic renal impairment associated with type 2 diabetes mellitus (HCC) 07/20/2010  . ULNAR NERVE ENTRAPMENT, RIGHT 06/07/2008  . Diabetes mellitus (HCC) 12/09/2007  . Essential hypertension, benign 12/08/2007  . DIASTASIS RECTI 12/08/2007  . HYPERLIPIDEMIA 06/26/2006  . ANEMIA, OTHER, UNSPECIFIED 06/26/2006  . Peripheral autonomic neuropathy due to diabetes mellitus (HCC) 06/26/2006  . GASTROESOPHAGEAL REFLUX DISEASE, CHRONIC 06/26/2006  . Enlarged prostate with lower urinary tract symptoms (LUTS) 06/26/2006    Azlynn Mitnick L 11/02/2015, 8:04 PM  [facility] [address] [place], [place], [address] Phone: [phone]   Fax:  [phone]  Name: Dorthula RueCharles A Lenz MRN: [id number] Date of Birth: July 03, 1940    Zerita BoersJennifer Otillia Cordone, PT,DPT 11/02/2015 8:04 PM Phone: [phone] Fax: [phone]

## 2015-11-05 ENCOUNTER — Other Ambulatory Visit: Payer: Self-pay | Admitting: Physical Medicine and Rehabilitation

## 2015-11-06 ENCOUNTER — Ambulatory Visit: Payer: Medicare Other | Admitting: Rehabilitation

## 2015-11-06 ENCOUNTER — Encounter: Payer: Medicare Other | Admitting: Occupational Therapy

## 2015-11-06 ENCOUNTER — Encounter: Payer: Self-pay | Admitting: Rehabilitation

## 2015-11-06 DIAGNOSIS — R2689 Other abnormalities of gait and mobility: Secondary | ICD-10-CM

## 2015-11-06 DIAGNOSIS — M6281 Muscle weakness (generalized): Secondary | ICD-10-CM

## 2015-11-06 NOTE — Therapy (Signed)
[facility] [address] [place], [place], [address] Phone: [phone]   Fax:  [phone]  Physical Therapy Treatment  Patient Details  Name: Tanner Mason MRN: [id number] Date of Birth: July 26, 1940 Referring Provider: Delice Lesch, MD  Encounter Date: 11/06/2015      PT End of Session - 11/06/15 1150    Visit Number 7   Number of Visits 17   Date for PT Re-Evaluation 12/11/15   Authorization Type UHC MDC-G Code on every 10th visit   PT Start Time 1146   PT Stop Time 1233   PT Time Calculation (min) 47 min   Equipment Utilized During Treatment Gait belt   Activity Tolerance Patient tolerated treatment well   Behavior During Therapy [facility] for tasks assessed/performed      Past Medical History  Diagnosis Date  . Elbow dislocation 8850,2774    Right  . Treadmill stress test negative for angina pectoris 06/2002    poor HR and BP recovery  . Abdominal ultrasound, abnormal 02/2004    fatty liver  . Encounter for diagnostic endoscopy 10/22/04    negative  . History of esophagogastroduodenoscopy 08/13/2007    normal  . Diabetes mellitus without complication [facility])     Past Surgical History  Procedure Laterality Date  . Inguinal hernia repair Right 1947  . Anterior cruciate ligament repair  5/98    Left, staph infection complicated  . Blepharoplasty  03/2005    with ptosis repair  . Basal cell carcinoma excision  02/2005    R ear  . Inguinal hernia repair Left 1970    There were no vitals filed for this visit.      Subjective Assessment - 11/06/15 1149    Subjective Reports went to the lake again, walked with and without the cane (with wife) up and down the hill.     Pertinent History DM with peripheral neruropathy, hx of LBP w/ radiculopathy   Limitations Walking;House hold activities;Standing   Patient Stated Goals To walk again normally and go [place] the [place] ([place]    Currently in Pain?  No/denies            [facility] PT Assessment - 11/06/15 1224    Functional Gait  Assessment   Gait assessed  Yes   Gait Level Surface Walks 20 ft in less than 7 sec but greater than 5.5 sec, uses assistive device, slower speed, mild gait deviations, or deviates 6-10 in outside of the 12 in walkway width.   Change in Gait Speed Able to change speed, demonstrates mild gait deviations, deviates 6-10 in outside of the 12 in walkway width, or no gait deviations, unable to achieve a major change in velocity, or uses a change in velocity, or uses an assistive device.   Gait with Horizontal Head Turns Performs head turns with moderate changes in gait velocity, slows down, deviates 10-15 in outside 12 in walkway width but recovers, can continue to walk.   Gait with Vertical Head Turns Performs task with slight change in gait velocity (eg, minor disruption to smooth gait path), deviates 6 - 10 in outside 12 in walkway width or uses assistive device   Gait and Pivot Turn Pivot turns safely in greater than 3 sec and stops with no loss of balance, or pivot turns safely within 3 sec and stops with mild imbalance, requires small steps to catch balance.   Step Over Obstacle Is able to step over one shoe  box (4.5 in total height) without changing gait speed. No evidence of imbalance.   Gait with Narrow Base of Support Ambulates 4-7 steps.   Gait with Eyes Closed Walks 20 ft, slow speed, abnormal gait pattern, evidence for imbalance, deviates 10-15 in outside 12 in walkway width. Requires more than 9 sec to ambulate 20 ft.   Ambulating Backwards Walks 20 ft, uses assistive device, slower speed, mild gait deviations, deviates 6-10 in outside 12 in walkway width.   Steps Alternating feet, no rail.   Total Score 18   FGA comment: < 19 = high risk fall                     [facility] Adult PT Treatment/Exercise - 11/06/15 1200    Berg Balance Test   Sit to Stand Able to stand without using hands and stabilize  independently   Standing Unsupported Able to stand safely 2 minutes   Sitting with Back Unsupported but Feet Supported on Floor or Stool Able to sit safely and securely 2 minutes   Stand to Sit Sits safely with minimal use of hands   Transfers Able to transfer safely, minor use of hands   Standing Unsupported with Eyes Closed Able to stand 10 seconds safely   Standing Ubsupported with Feet Together Able to place feet together independently and stand 1 minute safely   From Standing, Reach Forward with Outstretched Arm Can reach confidently >25 cm (10")   From Standing Position, Pick up Object from Floor Able to pick up shoe safely and easily   From Standing Position, Turn to Look Behind Over each Shoulder Looks behind from both sides and weight shifts well   Turn 360 Degrees Able to turn 360 degrees safely but slowly   Standing Unsupported, Alternately Place Feet on Step/Stool Able to stand independently and safely and complete 8 steps in 20 seconds   Standing Unsupported, One Foot in Front Able to plae foot ahead of the other independently and hold 30 seconds   Standing on One Leg Tries to lift leg/unable to hold 3 seconds but remains standing independently   Total Score 50      NMR:  Performed BERG balance test in order to assess STGs.  Note that score is 50/56, therefore assessed more dynamic balance deficits with FGA.  Scored 18/30, indicative of high fall risk.  See full details above.  Educated pt on results and due to progress, updating LTGs and adding FGA goal.  Pt verbalized understanding.    TA:  Performed 6MWT during session to assess STG.  Note that new distance is 1078, up from baseline distance of 870' demonstrating functional gain in endurance.  Pt verbalized understanding.  Also assessed gait speed during session, gait speed with cane was 2.67 ft/sec, therefore discussed results and updated LTG to reflect >2.62 ft/sec without device to increase independence with community gait.             PT Education - 11/06/15 1149    Education provided Yes   Education Details Continued education to utilize cane at all times (esp outdoors), also continued education on driving assessment needing to be done for clearance, otherwise needs clearance from MD.    Person(s) Educated Patient;Spouse   Methods Explanation   Comprehension Verbalized understanding          PT Short Term Goals - 11/06/15 1150    PT SHORT TERM GOAL #1   Title Pt will initiate HEP for BLE strengthening  and balance in order to decrease fall risk and improve functional mobility.  (Target Date: 11/09/15)   Baseline met 11/06/15   Status Achieved   PT SHORT TERM GOAL #2   Title Pt wil improve gait speed to 2.41 ft/sec in order to decrease fall risk and improve efficiency of gait.     Baseline 2.67 w/ SPC on 11/06/15   Status Achieved   PT SHORT TERM GOAL #3   Title Pt wil improve BERG balance score to 31/56 in order to demonstrate decreased fall risk.     Baseline 50/56 on 11/06/15   Status Achieved   PT SHORT TERM GOAL #4   Title Will assess 6MWT and improve distance by 150' in order to demonstrate improved functional endurance.     Baseline baseline was 870' and on 11/06/15 was 1078   Status Achieved   PT SHORT TERM GOAL #5   Title Pt will ambulate >200' over indoor surfaces w/ SPC at S level in order to demonstrate increased independence with gait.     Baseline met 11/06/15   Status Achieved           PT Long Term Goals - 11/06/15 1219    PT LONG TERM GOAL #1   Title Pt will be independent with HEP in order to demonstrate improved functional mobility and decreased fall risk.  (Target Date: 12/07/15)   Status New   PT LONG TERM GOAL #2   Title Pt will improve gait speed to >2.62 ft/sec without device in order to indicate pt is community ambulator.     Baseline updated due to progress   Status Revised   PT LONG TERM GOAL #3   Title Pt will increase BERG balance score to 37/56 in order to  indicate decreased fall risk.    Baseline deferred due to progress, see goal for FGA.    Status Deferred   PT LONG TERM GOAL #4   Title Pt will verbalize particpation in walking program/return to community fitness in order to maintain and progress endurance.     Status New   PT LONG TERM GOAL #5   Title Pt will ambulate without device over outdoor surfaces (paved and grass) independently in order to demonstrate return to community negotiation.     Baseline updated due to progress   Status Revised   PT LONG TERM GOAL #6   Title Pt will ambulate up/down grassy hill without device independently in order to indicate [place] to [place] house[place].     Baseline updated due to progress   Status Revised   PT LONG TERM GOAL #7   Title Pt will improve FGA score to 23/30 in order to indicate decreased fall risk.     Status New               Plan - 11/06/15 1310    Clinical Impression Statement Skilled session focused on checking STGs.  Pt has made excellent progress and has met 5/5 STG's.  Note that PT had to update most LTGs as pt has made such great progress.  Edcucated pt on progress and also the addition of FGA goal.  Pt and spouse verbalized understanding.     Rehab Potential Good   Clinical Impairments Affecting Rehab Potential co-morbidities   PT Frequency 2x / week   PT Duration 8 weeks   PT Treatment/Interventions ADLs/Self Care Home Management;Electrical Stimulation;DME Instruction;Gait training;Stair training;Functional mobility training;Therapeutic activities;Therapeutic exercise;Balance training;Neuromuscular re-education;Patient/family education;Orthotic Fit/Training;Energy conservation;Vestibular   PT Next Visit  Plan Continue gait with SPC on oudoor surfaces, dynamic gait/balance on compliant surfaces and bosu ball. Ant. wall bumps.   Consulted and Agree with Plan of Care Patient;Family member/caregiver   Family Member Consulted wife Marlowe Kays      Patient will benefit from  skilled therapeutic intervention in order to improve the following deficits and impairments:  Abnormal gait, Decreased activity tolerance, Decreased balance, Decreased endurance, Decreased knowledge of precautions, Decreased mobility, Decreased safety awareness, Decreased strength, Difficulty walking, Dizziness, Impaired perceived functional ability, Impaired sensation, Improper body mechanics, Postural dysfunction  Visit Diagnosis: Other abnormalities of gait and mobility  Muscle weakness (generalized)     Problem List Patient Active Problem List   Diagnosis Date Noted  . Irregular bowel habits   . Constipation   . Thrombocytopenia ([place])   . Acute on chronic renal insufficiency (HCC)   . Normocytic anemia   . Idiopathic angioedema   . Back pain   . Hypomagnesemia   . Type 2 diabetes mellitus with diabetic neuropathy, unspecified ([place])   . Guillain Barr syndrome ([place])   . Weakness 09/17/2015  . Gait abnormality 09/17/2015  . Gait instability 09/17/2015  . Cellulitis and abscess 07/14/2015  . Preventative health care 11/08/2014  . Angio-edema 07/14/2014  . Angioedema 07/13/2014  . Left foot drop 04/05/2014  . Lumbar back pain with radiculopathy affecting left lower extremity 04/05/2014  . Atypical chest pain 12/14/2013  . Hypokalemia 03/16/2013  . Overweight (BMI 25.0-29.9) 09/15/2012  . Stage 3 chronic renal impairment associated with type 2 diabetes mellitus ([place]) 07/20/2010  . ULNAR NERVE ENTRAPMENT, RIGHT 06/07/2008  . Diabetes mellitus ([place]) 12/09/2007  . Essential hypertension, benign 12/08/2007  . DIASTASIS RECTI 12/08/2007  . HYPERLIPIDEMIA 06/26/2006  . ANEMIA, OTHER, UNSPECIFIED 06/26/2006  . Peripheral autonomic neuropathy due to diabetes mellitus ([place]) 06/26/2006  . GASTROESOPHAGEAL REFLUX DISEASE, CHRONIC 06/26/2006  . Enlarged prostate with lower urinary tract symptoms (LUTS) 06/26/2006    Cameron Sprang, PT, MPT [facility] [address] [place], [place], [address] Phone: [phone]   Fax:  [phone] 11/06/2015, 4:06 PM  Name: JARRET TORRE MRN: [id number] Date of Birth: 01-09-1941

## 2015-11-09 ENCOUNTER — Ambulatory Visit: Payer: Medicare Other | Admitting: Rehabilitation

## 2015-11-09 ENCOUNTER — Encounter: Payer: Self-pay | Admitting: Rehabilitation

## 2015-11-09 ENCOUNTER — Encounter: Payer: Medicare Other | Admitting: Occupational Therapy

## 2015-11-09 DIAGNOSIS — R2681 Unsteadiness on feet: Secondary | ICD-10-CM

## 2015-11-09 DIAGNOSIS — M6281 Muscle weakness (generalized): Secondary | ICD-10-CM

## 2015-11-09 DIAGNOSIS — R2689 Other abnormalities of gait and mobility: Secondary | ICD-10-CM | POA: Diagnosis not present

## 2015-11-09 NOTE — Therapy (Signed)
[facility] [address] [place], [place], [address] Phone: [phone]   Fax:  [phone]  Physical Therapy Treatment  Patient Details  Name: Tanner Mason MRN: [id number] Date of Birth: [birth year] Referring Provider: Delice Lesch, MD  Encounter Date: 11/09/2015      PT End of Session - 11/09/15 0937    Visit Number 8   Number of Visits 17   Date for PT Re-Evaluation 12/11/15   Authorization Type UHC MDC-G Code on every 10th visit   PT Start Time 0931   PT Stop Time 1017   PT Time Calculation (min) 46 min   Equipment Utilized During Treatment Gait belt   Activity Tolerance Patient tolerated treatment well   Behavior During Therapy [facility] for tasks assessed/performed      Past Medical History  Diagnosis Date  . Elbow dislocation 2706,2376    Right  . Treadmill stress test negative for angina pectoris 06/2002    poor HR and BP recovery  . Abdominal ultrasound, abnormal 02/2004    fatty liver  . Encounter for diagnostic endoscopy 10/22/04    negative  . History of esophagogastroduodenoscopy 08/13/2007    normal  . Diabetes mellitus without complication [facility])     Past Surgical History  Procedure Laterality Date  . Inguinal hernia repair Right 1947  . Anterior cruciate ligament repair  5/98    Left, staph infection complicated  . Blepharoplasty  03/2005    with ptosis repair  . Basal cell carcinoma excision  02/2005    R ear  . Inguinal hernia repair Left 1970    There were no vitals filed for this visit.      Subjective Assessment - 11/09/15 0936    Subjective Reports walking in the yard and playing game in yard with and without cane.  Also went swimming and walked for up to 15 mins.    Patient is accompained by: Family member   Pertinent History DM with peripheral neruropathy, hx of LBP w/ radiculopathy   Limitations Walking;House hold activities;Standing   Patient Stated Goals To walk again  normally and [place]    Currently in Pain? No/denies                         [facility] Adult PT Treatment/Exercise - 11/09/15 0949    Ambulation/Gait   Ambulation/Gait Yes   Ambulation/Gait Assistance 6: Modified independent (Device/Increase time)   Ambulation/Gait Assistance Details Had pt ambulate over varying surfaces outdoors to challenge balance and assess need for SPC.  Performed all at independent level without cane, even uphill/downhill (grass) to somewhat simulate [place] terrain.  Performed head turns while walking on grass.  No gross LOB noted.  There were 2 instances that he had mild LOB, but able to use stepping strategy to recover independently.     Ambulation Distance (Feet) 600 Feet  outdoors.    Assistive device None   Gait Pattern Step-through pattern;Decreased arm swing - right;Decreased arm swing - left;Decreased stride length   Ambulation Surface Level;Unlevel;Indoor;Outdoor;Paved;Gravel;Grass   Curb 7: Independent   Curb Details (indicate cue type and reason) without device.       NMR:  Worked on high level balance on compliant surfaces, obstacle negotiation, and SLS.  Performed obstacle course consisting of stepping over barriers followed by tapping to cones following by large steps to increase time in SLS.  Requires min/guard initially progressing to  S level with second and third rep.  Progressed to cone tipping exercise while on blue gym mat to further address SLS.  Requires intermittent min/guard for occasional LOB, but pt able to recover on his own 90% of time.  Ended with corner balance tasks in order to challenge vestibular system.  See pt instruction for details on exercises and reps.             PT Education - 11/09/15 [phone]    Education provided Yes   Education Details Pt okay to D/C use of cane at this time on all surfaces.  Also educated on balance exercises, see pt instruction.     Person(s) Educated  Patient;Spouse   Methods Explanation;Demonstration;Handout   Comprehension Verbalized understanding;Returned demonstration          PT Short Term Goals - 11/06/15 1150    PT SHORT TERM GOAL #1   Title Pt will initiate HEP for BLE strengthening and balance in order to decrease fall risk and improve functional mobility.  (Target Date: 11/09/15)   Baseline met 11/06/15   Status Achieved   PT SHORT TERM GOAL #2   Title Pt wil improve gait speed to 2.41 ft/sec in order to decrease fall risk and improve efficiency of gait.     Baseline 2.67 w/ SPC on 11/06/15   Status Achieved   PT SHORT TERM GOAL #3   Title Pt wil improve BERG balance score to 31/56 in order to demonstrate decreased fall risk.     Baseline 50/56 on 11/06/15   Status Achieved   PT SHORT TERM GOAL #4   Title Will assess and improve distance by 150' in order to demonstrate improved functional endurance.     Baseline baseline was 870' and on 11/06/15 was 1078   Status Achieved   PT SHORT TERM GOAL #5   Title Pt will ambulate >200' over indoor surfaces w/ SPC at S level in order to demonstrate increased independence with gait.     Baseline met 11/06/15   Status Achieved           PT Long Term Goals - 11/09/15 1032    PT LONG TERM GOAL #1   Title Pt will be independent with HEP in order to demonstrate improved functional mobility and decreased fall risk.  (Target Date: 12/07/15)   Status New   PT LONG TERM GOAL #2   Title Pt will improve gait speed to >2.62 ft/sec without device in order to indicate pt is community ambulator.     Baseline updated due to progress   Status Revised   PT LONG TERM GOAL #3   Title Pt will increase BERG balance score to 37/56 in order to indicate decreased fall risk.    Baseline deferred due to progress, see goal for FGA.    Status Deferred   PT LONG TERM GOAL #4   Title Pt will verbalize particpation in walking program/return to community fitness in order to maintain and progress  endurance.     Status New   PT LONG TERM GOAL #5   Title Pt will ambulate without device over outdoor surfaces (paved and grass) independently in order to demonstrate return to community negotiation.     Baseline updated due to progress, met as of 11/09/15   Status Achieved   PT LONG TERM GOAL #6   Title Pt will ambulate up/down grassy hill without device independently in order to indicate return to [place].     Baseline  updated due to progress   Status Revised   PT LONG TERM GOAL #7   Title Pt will improve FGA score to 23/30 in order to indicate decreased fall risk.     Status New               Plan - 11/09/15 8719    Clinical Impression Statement Skilled session focused on addressing gait without SPC on varying indoor and outdoor surfaces.  Also initiated corner balance tasks to address vestibular system deficits.  Provided with handout, see pt instruction.  Pt okay to ambulate on all surfaces without SPC.     Rehab Potential Good   Clinical Impairments Affecting Rehab Potential co-morbidities   PT Frequency 2x / week   PT Duration 8 weeks   PT Treatment/Interventions ADLs/Self Care Home Management;Electrical Stimulation;DME Instruction;Gait training;Stair training;Functional mobility training;Therapeutic activities;Therapeutic exercise;Balance training;Neuromuscular re-education;Patient/family education;Orthotic Fit/Training;Energy conservation;Vestibular   PT Next Visit Plan dynamic gait/balance on compliant surfaces and bosu ball. Ant. wall bumps, SLS   Consulted and Agree with Plan of Care Patient;Family member/caregiver   Family Member Consulted wife Marlowe Kays      Patient will benefit from skilled therapeutic intervention in order to improve the following deficits and impairments:  Abnormal gait, Decreased activity tolerance, Decreased balance, Decreased endurance, Decreased knowledge of precautions, Decreased mobility, Decreased safety awareness, Decreased strength,  Difficulty walking, Dizziness, Impaired perceived functional ability, Impaired sensation, Improper body mechanics, Postural dysfunction  Visit Diagnosis: Other abnormalities of gait and mobility  Muscle weakness (generalized)  Unsteadiness on feet     Problem List Patient Active Problem List   Diagnosis Date Noted  . Irregular bowel habits   . Constipation   . Thrombocytopenia ([place])   . Acute on chronic renal insufficiency (HCC)   . Normocytic anemia   . Idiopathic angioedema   . Back pain   . Hypomagnesemia   . Type 2 diabetes mellitus with diabetic neuropathy, unspecified ([place])   . Guillain Barr syndrome ([place])   . Weakness 09/17/2015  . Gait abnormality 09/17/2015  . Gait instability 09/17/2015  . Cellulitis and abscess 07/14/2015  . Preventative health care 11/08/2014  . Angio-edema 07/14/2014  . Angioedema 07/13/2014  . Left foot drop 04/05/2014  . Lumbar back pain with radiculopathy affecting left lower extremity 04/05/2014  . Atypical chest pain 12/14/2013  . Hypokalemia 03/16/2013  . Overweight (BMI 25.0-29.9) 09/15/2012  . Stage 3 chronic renal impairment associated with type 2 diabetes mellitus ([place]) 07/20/2010  . ULNAR NERVE ENTRAPMENT, RIGHT 06/07/2008  . Diabetes mellitus ([place]) 12/09/2007  . Essential hypertension, benign 12/08/2007  . DIASTASIS RECTI 12/08/2007  . HYPERLIPIDEMIA 06/26/2006  . ANEMIA, OTHER, UNSPECIFIED 06/26/2006  . Peripheral autonomic neuropathy due to diabetes mellitus ([place]) 06/26/2006  . GASTROESOPHAGEAL REFLUX DISEASE, CHRONIC 06/26/2006  . Enlarged prostate with lower urinary tract symptoms (LUTS) 06/26/2006    Cameron Sprang, PT, MPT [facility] [address] [place], [place], [address] Phone: [phone]   Fax:  [phone] 11/09/2015, 10:41 AM  Name: ANTAWN SISON MRN: [id number] Date of Birth: October 12, 1940

## 2015-11-09 NOTE — Patient Instructions (Signed)
Stand in corner with chair in front of you for support.     Feet Together, Arm Motion - Eyes Closed    With eyes closed and feet together, keep arms by your side.   Repeat __3__ times per session for 30 seconds each. Do _1-2___ sessions per day.  Copyright  VHI. All rights reserved.   Feet Apart (Compliant Surface) Arm Motion - Eyes Closed    Stand on compliant surface: __stacked pillows or cushion______, feet shoulder width apart. Close eyes and keep arms by your side.  Repeat _3___ times per session for 30 seconds each. Do __1-2__ sessions per day.  Feet Together (Compliant Surface) Head Motion - Eyes Open    With eyes open, standing on compliant surface: __stacked pillows or cushion______, feet together, move head slowly: up and down x 10 reps, side to side x 10 reps, and diagonals both directions x 10 reps.   Do __1-2__ sessions per day.  Copyright  VHI. All rights reserved.    Copyright  VHI. All rights reserved.

## 2015-11-14 ENCOUNTER — Encounter: Payer: Medicare Other | Admitting: Occupational Therapy

## 2015-11-14 ENCOUNTER — Encounter: Payer: Self-pay | Admitting: Physical Therapy

## 2015-11-14 ENCOUNTER — Ambulatory Visit: Payer: Medicare Other | Admitting: Physical Therapy

## 2015-11-14 DIAGNOSIS — M6281 Muscle weakness (generalized): Secondary | ICD-10-CM

## 2015-11-14 DIAGNOSIS — R2689 Other abnormalities of gait and mobility: Secondary | ICD-10-CM

## 2015-11-14 DIAGNOSIS — R2681 Unsteadiness on feet: Secondary | ICD-10-CM

## 2015-11-14 DIAGNOSIS — R208 Other disturbances of skin sensation: Secondary | ICD-10-CM

## 2015-11-14 NOTE — Therapy (Signed)
[facility] [address] [place], [place], [address] Phone: [phone]   Fax:  [phone]  Physical Therapy Treatment  Patient Details  Name: Tanner Mason Date of Birth: [birth year] Referring Provider: Delice Lesch, MD  Encounter Date: 11/14/2015      PT End of Session - 11/14/15 1152    Visit Number 9   Number of Visits 17   Date for PT Re-Evaluation 12/11/15   Authorization Type UHC MDC-G Code on every 10th visit   PT Start Time 1101   PT Stop Time 1146   PT Time Calculation (min) 45 min   Equipment Utilized During Treatment Gait belt   Activity Tolerance Patient tolerated treatment well   Behavior During Therapy [facility] for tasks assessed/performed      Past Medical History  Diagnosis Date  . Elbow dislocation 1761,6073    Right  . Treadmill stress test negative for angina pectoris 06/2002    poor HR and BP recovery  . Abdominal ultrasound, abnormal 02/2004    fatty liver  . Encounter for diagnostic endoscopy 10/22/04    negative  . History of esophagogastroduodenoscopy 08/13/2007    normal  . Diabetes mellitus without complication [facility])     Past Surgical History  Procedure Laterality Date  . Inguinal hernia repair Right 1947  . Anterior cruciate ligament repair  5/98    Left, staph infection complicated  . Blepharoplasty  03/2005    with ptosis repair  . Basal cell carcinoma excision  02/2005    R ear  . Inguinal hernia repair Left 1970    There were no vitals filed for this visit.      Subjective Assessment - 11/14/15 1112    Subjective No falls. No pain. Reports feeling tired in hips quicker than usual sometimes at gym. Reports achieving walking at the [place] down [place].   Patient is accompained by: Family member   Pertinent History DM with peripheral neruropathy, hx of LBP w/ radiculopathy   Limitations Walking;House hold activities;Standing    Patient Stated Goals To walk again normally and go [place] ([place]    Currently in Pain? No/denies   Pain Score 0-No pain       Gait: Yes Gait Distance: 845 feet x1 (floor, sidewalk, grass/grass hill) with S and no AD, no LOB Gait Pattern: Narrow base of support (VC to widen base of support to increase balance and reduce risk of falling), Decreased step length on R and L, slight flexed trunk Gait Details: Patient walked from back right mat table near back door with no AD outside and to the right down long sidewalk, through the grass over on the side of the building and up toward the grass hill. He walked up/down the grass hill x 2 with Supervision and no LOB. Walked on sidewalk 20 feet with head turns side-side x1, then 20 feet head turns up/down x1 (Supervision with no LOB, VC for technique).  NMR: Patient performed obstacle course on double red mats side-stepping with R leg over hurdles and then tapping rubber circles with R foot (one circle in front, and one behind) x 4 hurdles (min guard with occasional LOB and needing min assist to regain balance); then repeated but leading over hurdles and tapping with L leg. Patient then performed forward stepping over hurdles x4 with toe taps on cones alternating lateral and across midline on R and L (min guard  with occasional LOB and needing min assist to regain balance); Minisquats 10 reps x2 on Bosu ball in // bars (min assist to help patient step onto and gain balance onto Bosu ball, but min guard once he is positioned on Bosu, VC for foot placement on Bosu, Intermittent use of UE on // bars for LOB); Corner balance training feet together with EC on two pillows 30 sec x3 (Supervision with intermittent use of UE for LOB), then added side-side head turns x10 and head up/down x10 (Supervision with intermittent UE use for LOB). Attempted SLS on two pillows, but patient was unable to gain balance at all without using UE the entire  time.         PT Education - 11/14/15 1205    Education provided Yes   Education Details Minisquats while balancing on Bosu ball at gym. Corner balance with EC and arm movements.   Person(s) Educated Patient;Spouse   Methods Explanation;Demonstration   Comprehension Verbalized understanding;Returned demonstration;Verbal cues required          PT Short Term Goals - 11/06/15 1150    PT SHORT TERM GOAL #1   Title Pt will initiate HEP for BLE strengthening and balance in order to decrease fall risk and improve functional mobility.  (Target Date: 11/09/15)   Baseline met 11/06/15   Status Achieved   PT SHORT TERM GOAL #2   Title Pt wil improve gait speed to 2.41 ft/sec in order to decrease fall risk and improve efficiency of gait.     Baseline 2.67 w/ SPC on 11/06/15   Status Achieved   PT SHORT TERM GOAL #3   Title Pt wil improve BERG balance score to 31/56 in order to demonstrate decreased fall risk.     Baseline 50/56 on 11/06/15   Status Achieved   PT SHORT TERM GOAL #4   Title Will assess 6MWT and improve distance by 150' in order to demonstrate improved functional endurance.     Baseline baseline was 870' and on 11/06/15 was 1078   Status Achieved   PT SHORT TERM GOAL #5   Title Pt will ambulate >200' over indoor surfaces w/ SPC at S level in order to demonstrate increased independence with gait.     Baseline met 11/06/15   Status Achieved           PT Long Term Goals - 11/09/15 1032    PT LONG TERM GOAL #1   Title Pt will be independent with HEP in order to demonstrate improved functional mobility and decreased fall risk.  (Target Date: 12/07/15)   Status New   PT LONG TERM GOAL #2   Title Pt will improve gait speed to >2.62 ft/sec without device in order to indicate pt is community ambulator.     Baseline updated due to progress   Status Revised   PT LONG TERM GOAL #3   Title Pt will increase BERG balance score to 37/56 in order to indicate decreased fall risk.     Baseline deferred due to progress, see goal for FGA.    Status Deferred   PT LONG TERM GOAL #4   Title Pt will verbalize particpation in walking program/return to community fitness in order to maintain and progress endurance.     Status New   PT LONG TERM GOAL #5   Title Pt will ambulate without device over outdoor surfaces (paved and grass) independently in order to demonstrate return to community negotiation.     Baseline updated due  to progress, met as of 11/09/15   Status Achieved   PT LONG TERM GOAL #6   Title Pt will ambulate up/down grassy hill without device independently in order to indicate return to [place].     Baseline updated due to progress   Status Revised   PT LONG TERM GOAL #7   Title Pt will improve FGA score to 23/30 in order to indicate decreased fall risk.     Status New           Plan - 11/14/15 1153    Clinical Impression Statement Skilled session focused on gait without SPC on varying indoor and outdoor surfaces, especially walking up and down grass hill. Continued corner balance tasks to address vestibular system deficits and initiated high level balance training using obstacle course tasks on compliant surface.   Rehab Potential Good   Clinical Impairments Affecting Rehab Potential co-morbidities   PT Frequency 2x / week   PT Duration 8 weeks   PT Treatment/Interventions ADLs/Self Care Home Management;Electrical Stimulation;DME Instruction;Gait training;Stair training;Functional mobility training;Therapeutic activities;Therapeutic exercise;Balance training;Neuromuscular re-education;Patient/family education;Orthotic Fit/Training;Energy conservation;Vestibular   PT Next Visit Plan G code due. Dynamic gait/balance on compliant surfaces with stepping over hurdles and toe taps on cones, and bosu ball with mini squats in // bars. Corner balance tasks.   Consulted and Agree with Plan of Care Patient;Family member/caregiver   Family Member Consulted wife  Marlowe Kays      Patient will benefit from skilled therapeutic intervention in order to improve the following deficits and impairments:  Abnormal gait, Decreased activity tolerance, Decreased balance, Decreased endurance, Decreased knowledge of precautions, Decreased mobility, Decreased safety awareness, Decreased strength, Difficulty walking, Dizziness, Impaired perceived functional ability, Impaired sensation, Improper body mechanics, Postural dysfunction  Visit Diagnosis: Unsteadiness on feet  Muscle weakness (generalized)  Other abnormalities of gait and mobility  Other disturbances of skin sensation     Problem List Patient Active Problem List   Diagnosis Date Noted  . Irregular bowel habits   . Constipation   . Thrombocytopenia ([place])   . Acute on chronic renal insufficiency (HCC)   . Normocytic anemia   . Idiopathic angioedema   . Back pain   . Hypomagnesemia   . Type 2 diabetes mellitus with diabetic neuropathy, unspecified ([place])   . Guillain Barr syndrome ([place])   . Weakness 09/17/2015  . Gait abnormality 09/17/2015  . Gait instability 09/17/2015  . Cellulitis and abscess 07/14/2015  . Preventative health care 11/08/2014  . Angio-edema 07/14/2014  . Angioedema 07/13/2014  . Left foot drop 04/05/2014  . Lumbar back pain with radiculopathy affecting left lower extremity 04/05/2014  . Atypical chest pain 12/14/2013  . Hypokalemia 03/16/2013  . Overweight (BMI 25.0-29.9) 09/15/2012  . Stage 3 chronic renal impairment associated with type 2 diabetes mellitus ([place]) 07/20/2010  . ULNAR NERVE ENTRAPMENT, RIGHT 06/07/2008  . Diabetes mellitus ([place]) 12/09/2007  . Essential hypertension, benign 12/08/2007  . DIASTASIS RECTI 12/08/2007  . HYPERLIPIDEMIA 06/26/2006  . ANEMIA, OTHER, UNSPECIFIED 06/26/2006  . Peripheral autonomic neuropathy due to diabetes mellitus ([place]) 06/26/2006  . GASTROESOPHAGEAL REFLUX DISEASE, CHRONIC 06/26/2006  . Enlarged prostate with lower urinary  tract symptoms (LUTS) 06/26/2006    Trixie Deis, SPTA 11/14/2015, 12:54 PM  [facility] [address] [place], [place], [address] Phone: [phone]   Fax:  [phone]  Name: Tanner Mason Date of Birth: Aug 01, 1940  This note has been reviewed and edited by supervising CI.  Willow Ora, PTA, [facility] [address] [place], [place] [address] [phone] 11/15/2015, 9:06 PM

## 2015-11-15 ENCOUNTER — Other Ambulatory Visit: Payer: Self-pay | Admitting: Family Medicine

## 2015-11-16 ENCOUNTER — Encounter: Payer: Medicare Other | Admitting: Occupational Therapy

## 2015-11-16 ENCOUNTER — Ambulatory Visit: Payer: Medicare Other | Admitting: Physical Therapy

## 2015-11-16 ENCOUNTER — Encounter: Payer: Self-pay | Admitting: Physical Therapy

## 2015-11-16 DIAGNOSIS — R2689 Other abnormalities of gait and mobility: Secondary | ICD-10-CM | POA: Diagnosis not present

## 2015-11-16 DIAGNOSIS — R2681 Unsteadiness on feet: Secondary | ICD-10-CM

## 2015-11-16 DIAGNOSIS — M6281 Muscle weakness (generalized): Secondary | ICD-10-CM

## 2015-11-16 DIAGNOSIS — R208 Other disturbances of skin sensation: Secondary | ICD-10-CM

## 2015-11-16 MED ORDER — METOPROLOL TARTRATE 75 MG PO TABS: 75.0000 mg | ORAL_TABLET | Freq: Two times a day (BID) | ORAL | Status: DC

## 2015-11-16 NOTE — Therapy (Addendum)
[facility] [address] [place], [place], [address] Phone: [phone]   Fax:  [phone]  Physical Therapy Treatment  Patient Details  Name: Tanner Mason MRN: [id number] Date of Birth: 06-29-1940 Referring Provider: Delice Lesch, MD  Encounter Date: 11/16/2015      PT End of Session - 11/16/15 1009    Visit Number 10   Number of Visits 17   Date for PT Re-Evaluation 12/11/15   Authorization Type UHC MDC-G Code on every 10th visit   PT Start Time 0847   PT Stop Time 0931   PT Time Calculation (min) 44 min   Equipment Utilized During Treatment Gait belt   Activity Tolerance Patient tolerated treatment well   Behavior During Therapy [facility] for tasks assessed/performed      Past Medical History  Diagnosis Date  . Elbow dislocation [id number]    Right  . Treadmill stress test negative for angina pectoris 06/2002    poor HR and BP recovery  . Abdominal ultrasound, abnormal 02/2004    fatty liver  . Encounter for diagnostic endoscopy 10/22/04    negative  . History of esophagogastroduodenoscopy 08/13/2007    normal  . Diabetes mellitus without complication [facility])     Past Surgical History  Procedure Laterality Date  . Inguinal hernia repair Right 1947  . Anterior cruciate ligament repair  5/98    Left, staph infection complicated  . Blepharoplasty  03/2005    with ptosis repair  . Basal cell carcinoma excision  02/2005    R ear  . Inguinal hernia repair Left 1970    There were no vitals filed for this visit.      Subjective Assessment - 11/16/15 0848    Subjective No falls. No pain. Reports that he worked more on balance at the gym. Reports feeling some weakness in his hips after gait training. Otherwise, no new complaints.   Patient is accompained by: Family member   Pertinent History DM with peripheral neruropathy, hx of LBP w/ radiculopathy   Limitations Walking;House hold activities;Standing   Patient Stated Goals To walk again normally [place]    Currently in Pain? No/denies   Pain Score 0-No pain             [facility] Adult PT Treatment/Exercise - 11/16/15 0847    Ambulation/Gait   Ambulation/Gait Yes   Ambulation/Gait Assistance 5: Supervision;4: Min guard   Ambulation/Gait Assistance Details Patient is supervision on sidewalk or floor, min guard on uneven grass for safety due to mild instability/veering. No LOB.   Ambulation Distance (Feet) 600 Feet   Assistive device None   Gait Pattern Step-through pattern;Decreased arm swing - right;Decreased arm swing - left;Decreased stride length   Ambulation Surface Level;Unlevel;Indoor;Outdoor;Paved;Grass;Gravel       NMR:  1) Minisquats while balancing standing on black surface of Bosu in // bars (10 reps x 3, VC to get feet wide on Bosu, min guard with intermittent use of hands on // bars for safety and to maintain balance) 2) Rocker board balance in // bars with EC (board forward 30 sec x 3, then board side-side 30 sec x 3, min guard with intermittent use of hands on // bars for safety and to maintain balance) 3) Balance on Airex pad in // bars with SLS toe taps on two cones placed at two corners beside airex (two toe taps on each cone with each foot, 10 reps on each  foot x 2, min guard with intermittent use of hands on // bars for safety) 4) Side-step walking on blue beam in // bars (length of // bars down/back x 3, min guard with intermittent use of hands for safety) 5) Tandem walk on blue beam just forwards (length of // bars down/back x 3, VC to keep foot in center of blue beam, min guard with intermittent use of hands for safety) 6) SLS on blue beam in // bars (30 sec x 3 each leg, min guard with intermittent use of hands on bars for safety) 7) Blue mat on ramp balancing facing down ramp with EC and head turns (side-side 10 reps x1, up/down 10 reps x1, diagonals 10 reps x1, VC for technique,  min guard for safety) 8) Tandem stance static balance on blue mat on ramp facing up ramp EO (30 sec x 2 with each leg, VC for technique, min guard for safety)          PT Short Term Goals - 11/06/15 1150    PT SHORT TERM GOAL #1   Title Pt will initiate HEP for BLE strengthening and balance in order to decrease fall risk and improve functional mobility.  (Target Date: 11/09/15)   Baseline met 11/06/15   Status Achieved   PT SHORT TERM GOAL #2   Title Pt wil improve gait speed to 2.41 ft/sec in order to decrease fall risk and improve efficiency of gait.     Baseline 2.67 w/ SPC on 11/06/15   Status Achieved   PT SHORT TERM GOAL #3   Title Pt wil improve BERG balance score to 31/56 in order to demonstrate decreased fall risk.     Baseline 50/56 on 11/06/15   Status Achieved   PT SHORT TERM GOAL #4   Title Will assess 6MWT and improve distance by 150' in order to demonstrate improved functional endurance.     Baseline baseline was 870' and on 11/06/15 was 1078   Status Achieved   PT SHORT TERM GOAL #5   Title Pt will ambulate >200' over indoor surfaces w/ SPC at S level in order to demonstrate increased independence with gait.     Baseline met 11/06/15   Status Achieved           PT Long Term Goals - 11/09/15 1032    PT LONG TERM GOAL #1   Title Pt will be independent with HEP in order to demonstrate improved functional mobility and decreased fall risk.  (Target Date: 12/07/15)   Status New   PT LONG TERM GOAL #2   Title Pt will improve gait speed to >2.62 ft/sec without device in order to indicate pt is community ambulator.     Baseline updated due to progress   Status Revised   PT LONG TERM GOAL #3   Title Pt will increase BERG balance score to 37/56 in order to indicate decreased fall risk.    Baseline deferred due to progress, see goal for FGA.    Status Deferred   PT LONG TERM GOAL #4   Title Pt will verbalize particpation in walking program/return to community fitness in  order to maintain and progress endurance.     Status New   PT LONG TERM GOAL #5   Title Pt will ambulate without device over outdoor surfaces (paved and grass) independently in order to demonstrate return to community negotiation.     Baseline updated due to progress, met as of 11/09/15   Status Achieved  PT LONG TERM GOAL #6   Title Pt will ambulate up/down grassy hill without device independently in order to indicate return to [place] house[place].     Baseline updated due to progress   Status Revised   PT LONG TERM GOAL #7   Title Pt will improve FGA score to 23/30 in order to indicate decreased fall risk.     Status New           G-Codes - 11/18/2015 1044    Functional Assessment Tool Used Berg 50/56, FGA 18/30   Functional Limitation Mobility: Walking and moving around              Plan - 11/18/2015 1011    Clinical Impression Statement Skilled session focused on gait outdoor/indoor surfaces without SPC. He is showing good progress with gait on various surfaces with no LOB. Continued to challenge balance/vestibular system with various high level balance tasks. He should continue to benefit from skilled PT to address ongoing goals.   Rehab Potential Good   Clinical Impairments Affecting Rehab Potential co-morbidities   PT Frequency 2x / week   PT Duration 8 weeks   PT Treatment/Interventions ADLs/Self Care Home Management;Electrical Stimulation;DME Instruction;Gait training;Stair training;Functional mobility training;Therapeutic activities;Therapeutic exercise;Balance training;Neuromuscular re-education;Patient/family education;Orthotic Fit/Training;Energy conservation;Vestibular   PT Next Visit Plan Dynamic gait/balance on compliant surfaces with stepping over hurdles and toe taps on cones, and bosu ball with mini squats in // bars. Corner balance tasks.   Consulted and Agree with Plan of Care Patient;Family member/caregiver   Family Member Consulted wife Marlowe Kays      Patient  will benefit from skilled therapeutic intervention in order to improve the following deficits and impairments:  Abnormal gait, Decreased activity tolerance, Decreased balance, Decreased endurance, Decreased knowledge of precautions, Decreased mobility, Decreased safety awareness, Decreased strength, Difficulty walking, Dizziness, Impaired perceived functional ability, Impaired sensation, Improper body mechanics, Postural dysfunction  Visit Diagnosis: Unsteadiness on feet  Muscle weakness (generalized)  Other abnormalities of gait and mobility  Other disturbances of skin sensation     Problem List Patient Active Problem List   Diagnosis Date Noted  . Irregular bowel habits   . Constipation   . Thrombocytopenia ([place])   . Acute on chronic renal insufficiency (HCC)   . Normocytic anemia   . Idiopathic angioedema   . Back pain   . Hypomagnesemia   . Type 2 diabetes mellitus with diabetic neuropathy, unspecified ([place])   . Guillain Barr syndrome ([place])   . Weakness 09/17/2015  . Gait abnormality 09/17/2015  . Gait instability 09/17/2015  . Cellulitis and abscess 07/14/2015  . Preventative health care 11/08/2014  . Angio-edema 07/14/2014  . Angioedema 07/13/2014  . Left foot drop 04/05/2014  . Lumbar back pain with radiculopathy affecting left lower extremity 04/05/2014  . Atypical chest pain 12/14/2013  . Hypokalemia 03/16/2013  . Overweight (BMI 25.0-29.9) 09/15/2012  . Stage 3 chronic renal impairment associated with type 2 diabetes mellitus ([place]) 07/20/2010  . ULNAR NERVE ENTRAPMENT, RIGHT 06/07/2008  . Diabetes mellitus ([place]) 12/09/2007  . Essential hypertension, benign 12/08/2007  . DIASTASIS RECTI 12/08/2007  . HYPERLIPIDEMIA 06/26/2006  . ANEMIA, OTHER, UNSPECIFIED 06/26/2006  . Peripheral autonomic neuropathy due to diabetes mellitus ([place]) 06/26/2006  . GASTROESOPHAGEAL REFLUX DISEASE, CHRONIC 06/26/2006  . Enlarged prostate with lower urinary tract symptoms (LUTS)  06/26/2006    Trixie Deis, SPTA 11-18-2015, 11:19 AM  [facility] [address] [place], [place], [address] Phone: [phone]   Fax:  [phone]  Name: Tanner Mason MRN: [id number] Date of Birth: [birth year]  This note has been reviewed and edited by supervising CI.  Willow Ora, PTA, [facility] [place] [address] [place], [place] [address] [phone] 11/17/2015, 10:47 AM    11/23/2015 1044  PT G-Codes  Functional Assessment Tool Used Merrilee Jansky 50/56, FGA 18/30  Functional Limitation Mobility: Walking and moving around  Mobility: Walking and Moving Around Current Status [phone]) CK (is same due to using FGA as pt has almost capped out on BERG)  Mobility: Walking and Moving Around Goal Status [phone]) CJ    Physical Therapy Progress Note  Dates of Reporting Period: 10/12/15 to 23-Nov-2015  Objective Reports of Subjective Statement: See above  Objective Measurements: FGA 18/30  Goal Update: See updated LTG's above  Plan: continue POC  Reason Skilled Services are Required: Pt continues to have high level balance deficits   Gcode and progress note entered by:  Cameron Sprang, PT, MPT [facility] [address] [place], [place], [address] Phone: [phone]   Fax:  [phone] 11/18/2015, 10:22 AM

## 2015-11-18 ENCOUNTER — Other Ambulatory Visit: Payer: Self-pay | Admitting: Family Medicine

## 2015-11-20 ENCOUNTER — Other Ambulatory Visit: Payer: Self-pay | Admitting: Family Medicine

## 2015-11-21 ENCOUNTER — Encounter: Payer: Self-pay | Admitting: Physical Therapy

## 2015-11-21 ENCOUNTER — Encounter: Payer: Medicare Other | Admitting: Occupational Therapy

## 2015-11-21 ENCOUNTER — Ambulatory Visit: Payer: Medicare Other | Admitting: Physical Therapy

## 2015-11-21 DIAGNOSIS — R208 Other disturbances of skin sensation: Secondary | ICD-10-CM

## 2015-11-21 DIAGNOSIS — M6281 Muscle weakness (generalized): Secondary | ICD-10-CM

## 2015-11-21 DIAGNOSIS — R2681 Unsteadiness on feet: Secondary | ICD-10-CM

## 2015-11-21 DIAGNOSIS — R2689 Other abnormalities of gait and mobility: Secondary | ICD-10-CM

## 2015-11-21 NOTE — Therapy (Signed)
[facility] [address] [place], [place], [address] Phone: [phone]   Fax:  [phone]  Physical Therapy Treatment  Patient Details  Name: Tanner Mason MRN: [id number] Date of Birth: July 20, 1940 Referring Provider: Delice Lesch, MD  Encounter Date: 11/21/2015      PT End of Session - 11/21/15 1252    Visit Number 11   Number of Visits 17   Date for PT Re-Evaluation 12/11/15   Authorization Type UHC MDC-G Code on every 10th visit   PT Start Time 1102   PT Stop Time 1147   PT Time Calculation (min) 45 min   Equipment Utilized During Treatment Gait belt   Activity Tolerance Patient tolerated treatment well   Behavior During Therapy [facility] for tasks assessed/performed      Past Medical History:  Diagnosis Date  . Abdominal ultrasound, abnormal 02/2004   fatty liver  . Diabetes mellitus without complication ([place])   . Elbow dislocation [id number]   Right  . Encounter for diagnostic endoscopy 10/22/04   negative  . History of esophagogastroduodenoscopy 08/13/2007   normal  . Treadmill stress test negative for angina pectoris 06/2002   poor HR and BP recovery    Past Surgical History:  Procedure Laterality Date  . ANTERIOR CRUCIATE LIGAMENT REPAIR  5/98   Left, staph infection complicated  . BASAL CELL CARCINOMA EXCISION  02/2005   R ear  . BLEPHAROPLASTY  03/2005   with ptosis repair  . INGUINAL HERNIA REPAIR Right 1947  . INGUINAL HERNIA REPAIR Left 1970    There were no vitals filed for this visit.      Subjective Assessment - 11/21/15 1105    Subjective No falls. No pain. Reports that he works mostly on balance at the gym with some strengthening. He says his walking is less stable in the past week at the [facility].   Patient is accompained by: Family member   Pertinent History DM with peripheral neruropathy, hx of LBP w/ radiculopathy   Limitations Walking;House hold activities;Standing   Patient  Stated Goals To walk again normally and [place]    Currently in Pain? No/denies   Pain Score 0-No pain        NMR:  1) Patient performed obstacle course on double red mats forward stepping over orange hurdles with ipsilateral cone taps once with each foot on a cone placed on each side of hurdle in between hurdles (3 laps total, VC for technique/sequencing, Min guard for occasional LOB)   2) Patient performed obstacle course on double red mats side-stepping over orange hurdles with cone taps once on each foot on a cone placed in between, and lateral, to hurdles (3 laps total with down/back = 1 lap, VC for technique/sequencing, Min guard for occasional LOB)  3) Static balance on blue mat placed on ramp: EC facing down ramp 30 sec x 3 (Min guard for occasional LOB, VC for foot placement); EC facing up ramp 30 sec x 3 (Min guard for occasional LOB, VC for foot placement); EO Tandem stride stance facing up ramp 30 sec x 3 (Min guard for occasional LOB, VC for foot placement)  4) Minisquats at mat table with single cone toe taps once on each foot before descent/squat back to mat (10 reps x1, VC for technique/sequencing, Min guard for occasional LOB)        PT Education - 11/21/15 1250    Education provided Yes  Education Details Pt instructed to allow at least one day of rest/recovery. Currently he attends therapy 2x week and goes to gym 3 days a week, then works at [employer] over weekends. Discussed decreasing the amount he does at gym (breaking it into cardio/strengthening 1 time/balance other days vs going to gym on days of PT to do just cardio/strengthening while balance is addressed at therapy). these would allow him days "off to recover". Also discussed using lower weights on machines as pt is using high weights with high repititions, contributing to him feeling more tired/off balance.                                    Person(s) Educated Patient;Spouse    Methods Explanation   Comprehension Verbalized understanding              PT Short Term Goals - 11/06/15 1150      PT SHORT TERM GOAL #1   Title Pt will initiate HEP for BLE strengthening and balance in order to decrease fall risk and improve functional mobility.  (Target Date: 11/09/15)   Baseline met 11/06/15   Status Achieved     PT SHORT TERM GOAL #2   Title Pt wil improve gait speed to 2.41 ft/sec in order to decrease fall risk and improve efficiency of gait.     Baseline 2.67 w/ SPC on 11/06/15   Status Achieved     PT SHORT TERM GOAL #3   Title Pt wil improve BERG balance score to 31/56 in order to demonstrate decreased fall risk.     Baseline 50/56 on 11/06/15   Status Achieved     PT SHORT TERM GOAL #4   Title Will assess 6MWT and improve distance by 150' in order to demonstrate improved functional endurance.     Baseline baseline was 870' and on 11/06/15 was 1078   Status Achieved     PT SHORT TERM GOAL #5   Title Pt will ambulate >200' over indoor surfaces w/ SPC at S level in order to demonstrate increased independence with gait.     Baseline met 11/06/15   Status Achieved           PT Long Term Goals - 11/09/15 1032      PT LONG TERM GOAL #1   Title Pt will be independent with HEP in order to demonstrate improved functional mobility and decreased fall risk.  (Target Date: 12/07/15)   Status New     PT LONG TERM GOAL #2   Title Pt will improve gait speed to >2.62 ft/sec without device in order to indicate pt is community ambulator.     Baseline updated due to progress   Status Revised     PT LONG TERM GOAL #3   Title Pt will increase BERG balance score to 37/56 in order to indicate decreased fall risk.    Baseline deferred due to progress, see goal for FGA.    Status Deferred     PT LONG TERM GOAL #4   Title Pt will verbalize particpation in walking program/return to community fitness in order to maintain and progress endurance.     Status New      PT LONG TERM GOAL #5   Title Pt will ambulate without device over outdoor surfaces (paved and grass) independently in order to demonstrate return to community negotiation.     Baseline updated due to  progress, met as of 11/09/15   Status Achieved     PT LONG TERM GOAL #6   Title Pt will ambulate up/down grassy hill without device independently in order to indicate return to lake house/boat.     Baseline updated due to progress   Status Revised     PT LONG TERM GOAL #7   Title Pt will improve FGA score to 23/30 in order to indicate decreased fall risk.     Status New           Plan - 11/21/15 1252    Clinical Impression Statement Skilled session focused on higher level static and dynamic balance training on compliant surfaces. He is showing steady progress and should benefit from continued skilled PT to address ongoing goals.   Rehab Potential Good   Clinical Impairments Affecting Rehab Potential co-morbidities   PT Frequency 2x / week   PT Duration 8 weeks   PT Treatment/Interventions ADLs/Self Care Home Management;Electrical Stimulation;DME Instruction;Gait training;Stair training;Functional mobility training;Therapeutic activities;Therapeutic exercise;Balance training;Neuromuscular re-education;Patient/family education;Orthotic Fit/Training;Energy conservation;Vestibular   PT Next Visit Plan Dynamic gait/balance on compliant surfaces with stepping over hurdles and toe taps on cones, and bosu ball with mini squats in // bars. Balance tasks on blue mat over ramp.   Consulted and Agree with Plan of Care Patient;Family member/caregiver   Family Member Consulted wife Marlowe Kays      Patient will benefit from skilled therapeutic intervention in order to improve the following deficits and impairments:  Abnormal gait, Decreased activity tolerance, Decreased balance, Decreased endurance, Decreased knowledge of precautions, Decreased mobility, Decreased safety awareness, Decreased strength,  Difficulty walking, Dizziness, Impaired perceived functional ability, Impaired sensation, Improper body mechanics, Postural dysfunction  Visit Diagnosis: Unsteadiness on feet  Muscle weakness (generalized)  Other abnormalities of gait and mobility  Other disturbances of skin sensation     Problem List Patient Active Problem List   Diagnosis Date Noted  . Irregular bowel habits   . Constipation   . Thrombocytopenia ([place])   . Acute on chronic renal insufficiency (HCC)   . Normocytic anemia   . Idiopathic angioedema   . Back pain   . Hypomagnesemia   . Type 2 diabetes mellitus with diabetic neuropathy, unspecified ([place])   . Guillain Barr syndrome ([place])   . Weakness 09/17/2015  . Gait abnormality 09/17/2015  . Gait instability 09/17/2015  . Cellulitis and abscess 07/14/2015  . Preventative health care 11/08/2014  . Angio-edema 07/14/2014  . Angioedema 07/13/2014  . Left foot drop 04/05/2014  . Lumbar back pain with radiculopathy affecting left lower extremity 04/05/2014  . Atypical chest pain 12/14/2013  . Hypokalemia 03/16/2013  . Overweight (BMI 25.0-29.9) 09/15/2012  . Stage 3 chronic renal impairment associated with type 2 diabetes mellitus ([place]) 07/20/2010  . ULNAR NERVE ENTRAPMENT, RIGHT 06/07/2008  . Diabetes mellitus ([place]) 12/09/2007  . Essential hypertension, benign 12/08/2007  . DIASTASIS RECTI 12/08/2007  . HYPERLIPIDEMIA 06/26/2006  . ANEMIA, OTHER, UNSPECIFIED 06/26/2006  . Peripheral autonomic neuropathy due to diabetes mellitus ([place]) 06/26/2006  . GASTROESOPHAGEAL REFLUX DISEASE, CHRONIC 06/26/2006  . Enlarged prostate with lower urinary tract symptoms (LUTS) 06/26/2006    Trixie Deis, SPTA 11/21/2015, 12:57 PM  [facility] [address] [place], [place], [address] Phone: [phone]   Fax:  [phone]  Name: Tanner Mason MRN: [id number] Date of Birth: 17-Oct-1940  This note has  been reviewed and edited by supervising CI.  Willow Ora, PTA, [facility]  [address], [place], [place] [address] [phone] 11/21/15, 2:29 PM

## 2015-11-23 ENCOUNTER — Ambulatory Visit (INDEPENDENT_AMBULATORY_CARE_PROVIDER_SITE_OTHER): Payer: Medicare Other | Admitting: Neurology

## 2015-11-23 ENCOUNTER — Ambulatory Visit: Payer: Medicare Other | Admitting: Physical Therapy

## 2015-11-23 ENCOUNTER — Encounter: Payer: Medicare Other | Admitting: Occupational Therapy

## 2015-11-23 ENCOUNTER — Encounter: Payer: Self-pay | Admitting: Neurology

## 2015-11-23 ENCOUNTER — Encounter: Payer: Self-pay | Admitting: Physical Therapy

## 2015-11-23 VITALS — BP 100/60 | HR 84 | Ht 67.0 in | Wt 159.2 lb

## 2015-11-23 DIAGNOSIS — E0842 Diabetes mellitus due to underlying condition with diabetic polyneuropathy: Secondary | ICD-10-CM

## 2015-11-23 DIAGNOSIS — M21372 Foot drop, left foot: Secondary | ICD-10-CM | POA: Diagnosis not present

## 2015-11-23 DIAGNOSIS — G610 Guillain-Barre syndrome: Secondary | ICD-10-CM

## 2015-11-23 DIAGNOSIS — R208 Other disturbances of skin sensation: Secondary | ICD-10-CM

## 2015-11-23 DIAGNOSIS — R2689 Other abnormalities of gait and mobility: Secondary | ICD-10-CM | POA: Diagnosis not present

## 2015-11-23 DIAGNOSIS — R2681 Unsteadiness on feet: Secondary | ICD-10-CM

## 2015-11-23 DIAGNOSIS — M6281 Muscle weakness (generalized): Secondary | ICD-10-CM

## 2015-11-23 NOTE — Patient Instructions (Signed)
1.  Continue your home exercises 2.  Monitor your blood pressure at home and share with your primary care doctor to see if any changes need to be made  Return to clinic in 6 months

## 2015-11-23 NOTE — Progress Notes (Signed)
[facility] Clinic Note - Initial Visit   Date: 11/23/15  JAMORIAN DIMARIA MRN: [id number] DOB: 31-May-1940   Dear Dr. Ree Kida:  Thank you for your kind referral of Joellyn Rued for consultation of Guillain-Barre syndrome. Although his history is well known to you, please allow [place] to reiterate it for the purpose of our medical record. The patient was accompanied to the clinic by wife who also provides collateral information.     History of Present Illness: Tanner Mason is a 75 y.o. right-handed Caucasian male with insulin-dependent diabetes mellitus c/b renal insufficiency, hypertension, basal cell carcinoma of the ear s/p resection, and hyperlipidemia presenting for evaluation of Guillain-Barre syndrome.    He was visiting [place] at [facility] and he suddenly developed worsening tingling of the feet which began involving his lower leg.  He was evaluated at the ER in [place] where MRI brain and lumbar spine was performed.  The following day he came to [facility], he was unable to walk which prompted him to go to the [facility] ED.  By the time he arrived at the ED, his tingling and numbness involved the level of thigh.  Patient was hospitalized at [facility] from 5/21 - 09/26/2015 with progressive numbness of the feet, imbalance, and weakness.  His MRI lumbar spine showed mild-moderate stenosis at L4-5 and L5-S1.  Due to clinical exam showing arreflexia, there was a high suspicion for GBS, and his CSF testing was consistent with this showing albuminocytologic dissociation and elevated IgG index.  Of note, his CSF glucose was also elevated, but serum glucose on the same day was 280-363.  He completed 5 sessions of plasmapheresis. After the 3rd session, he began noticing improved paresthesias. He developed dysautonomia manifesting with orthostasis for which his blood pressure medications were adjusted.  He was discharged to rehab facility and went  home on 6/12 with a walker and out-patient PT.   He has been doing out-patient physical therapy and noticed significant improvement and able to walk unassisted.  His balance is still a problem, and he has not had any falls.  His strength is improving and now his paresthesias are isolated to his feet, which is where it used to be given his history of neuropathy.  He also has left foot weakness which has been present for at least the past 4 years.  He was told that he may need back surgery for this some time back, but decided to pursue it conservatively. Overall, he feels that is is very close to his baseline.     Out-side paper records, electronic medical record, and images have been reviewed where available and summarized as:  MRI lumbar spine wo contradst 09/19/2015: 1. Overall stable appearance of the lumbar spine as compared to previous MRI from 04/13/2014. No significant canal stenosis. No evidence for cord compression. 2. Similar effacement of the fat around the descending left L5 nerve root in the left L4-5 lateral recess, again suspicious for a small sequestered disc fragment. This could potentially affect the transiting left L5 nerve root. This is similar to prior. 3. Mild to moderate bilateral foraminal stenosis at L4-5 and L5-S1, stable.  CSF 09/18/2015:  R1  W1  G148*  P135*, IgG index 8.7, CSF cultures negative [place] 09/18/2015:  CRP 0.6, vitamin B12 471, ANA neg, HIV neg, RPR, neg, TSH 1.32, HbA1c 7.4*, ESR 25   Past Medical History:  Diagnosis Date  . Abdominal ultrasound, abnormal 02/2004   fatty liver  .  Diabetes mellitus without complication ([place])   . Elbow dislocation [id number]   Right  . Encounter for diagnostic endoscopy 10/22/04   negative  . History of esophagogastroduodenoscopy 08/13/2007   normal  . Treadmill stress test negative for angina pectoris 06/2002   poor HR and BP recovery    Past Surgical History:  Procedure Laterality Date  . ANTERIOR CRUCIATE LIGAMENT REPAIR   5/98   Left, staph infection complicated  . BASAL CELL CARCINOMA EXCISION  02/2005   R ear  . BLEPHAROPLASTY  03/2005   with ptosis repair  . INGUINAL HERNIA REPAIR Right 1947  . INGUINAL HERNIA REPAIR Left 1970     Medications:  Outpatient Encounter Prescriptions as of 11/23/2015  Medication Sig  . amLODipine (NORVASC) 5 MG tablet TAKE 1 TABLET (5 MG TOTAL) BY MOUTH DAILY.  Marland Kitchen aspirin (BAYER CHILDRENS ASPIRIN) 81 MG chewable tablet Chew 81 mg by mouth daily.    Marland Kitchen atorvastatin (LIPITOR) 40 MG tablet TAKE ONE TABLET BY MOUTH ONE TIME DAILY  . BD PEN NEEDLE NANO U/F 32G X 4 MM MISC USE AS INSTRUCTED BY YOUR PHYSICIAN TO CHECK BLOOD SUGAR  . cetirizine (ZYRTEC) 10 MG tablet Take 10 mg by mouth every evening.  . diphenhydrAMINE (BENADRYL) 12.5 MG/5ML elixir Take 25 mg by mouth 4 (four) times daily as needed for allergies.  Marland Kitchen EPIPEN 2-PAK 0.3 MG/0.3ML SOAJ injection INJECT 0.3MLS INTO THE MUSLE ONCE  . FERREX 150 150 MG capsule TAKE 1 CAPSULE (150 MG TOTAL) BY MOUTH 2 (TWO) TIMES DAILY BEFORE LUNCH AND SUPPER.  . finasteride (PROSCAR) 5 MG tablet TAKE 1 TABLET BY MOUTH DAILY.  Marland Kitchen gabapentin (NEURONTIN) 600 MG tablet Take 1 tablet (600 mg total) by mouth 2 (two) times daily.  . Insulin Glargine (LANTUS SOLOSTAR) 100 UNIT/ML Solostar Pen Inject 40 Units into the skin daily at 10 pm.  . insulin lispro (HUMALOG) 100 UNIT/ML KiwkPen Decrease to    5 units three times a day with meals.  . Metoprolol Tartrate 75 MG TABS Take 75 mg by mouth 2 (two) times daily.  Marland Kitchen omeprazole (PRILOSEC) 20 MG capsule TAKE ONE CAPSULE BY MOUTH ONE TIME DAILY  . ONE TOUCH ULTRA TEST test strip USE TO CHECK BLOOD SUGAR THREE TIMES DAILY  . potassium chloride (K-DUR,KLOR-CON) 10 MEQ tablet Take 1 tablet (10 mEq total) by mouth daily.  Marland Kitchen senna-docusate (SENOKOT-S) 8.6-50 MG tablet Take 2 tablets by mouth 2 (two) times daily.  . simethicone (MYLICON) 80 MG chewable tablet Chew 1 tablet (80 mg total) by mouth 3 (three) times  daily before meals.  . tamsulosin (FLOMAX) 0.4 MG CAPS capsule TAKE ONE CAPSULE BY MOUTH ONE TIME DAILY  . VICTOZA 18 MG/3ML SOPN INJECT 1.8MG INTO THE SKIN DAILY  . [DISCONTINUED] traMADol (ULTRAM) 50 MG tablet Take 1 tablet (50 mg total) by mouth every 6 (six) hours as needed for severe pain.   No facility-administered encounter medications on file as of 11/23/2015.      Allergies:  Allergies  Allergen Reactions  . Ace Inhibitors Other (See Comments)    REACTION: Angioedema  . Calcium Carbonate Antacid Other (See Comments)    REACTION: Angioedema of mouth    Family History: Family History  Problem Relation Age of Onset  . Alzheimer's disease Mother     died age 35  . Aortic aneurysm Father     died age 10  . Anuerysm Father   . Aortic aneurysm Brother     repaired plus AI  graft  . Diabetes Brother     Social History: Social History  Substance Use Topics  . Smoking status: Former Smoker    Packs/day: 1.00    Years: 10.00    Types: Cigarettes    Start date: 1943/04/27    Quit date: 06/10/1967  . Smokeless tobacco: Never Used  . Alcohol use 1.0 oz/week    2 Standard drinks or equivalent per week     Comment: occasional   Social History   Social History Narrative   Daughter, Jerene Pitch in [place], has 2 children   Daugher, Sharyn Lull, in [place], 2 children      He retired from teaching in 2010      Health Care POA:    Emergency Contact: wife Marlowe Kays   End of Life Plan:    Who lives with you: wife   Any pets: none   Diet: Patient has a varied diet of vegetables, protein, and starch.  Pt reports enjoying his sweets and carbohydrates.   Exercise: Pt exercises 5x week at [facility].  Cardio, strength    Seatbelts: Pt reports wearing seatbelt when in vehicle.    Sun Exposure/Protection: Pt reports wearing sun screen/hat and is under care of dermatologist.   Hobbies: boating, vacationing   One story home.              Review of Systems:  CONSTITUTIONAL: No  fevers, chills, night sweats, or weight loss.   EYES: No visual changes or eye pain ENT: No hearing changes.  No history of nose bleeds.   RESPIRATORY: No cough, wheezing and shortness of breath.   CARDIOVASCULAR: Negative for chest pain, and palpitations.   GI: Negative for abdominal discomfort, blood in stools or black stools.  No recent change in bowel habits.   GU:  No history of incontinence.   MUSCLOSKELETAL: No history of joint pain or swelling.  No myalgias.   SKIN: Negative for lesions, rash, and itching.   HEMATOLOGY/ONCOLOGY: Negative for prolonged bleeding, bruising easily, and swollen nodes.  +history of cancer.   ENDOCRINE: Negative for cold or heat intolerance, polydipsia or goiter.   PSYCH:  No depression or anxiety symptoms.   NEURO: As Above.   Vital Signs:  BP 100/60   Pulse 84   Ht '5\' 7"'  (1.702 m)   Wt 159 lb 4 oz (72.2 kg)   SpO2 97%   BMI 24.94 kg/m  Pain Scale: 0 on a scale of 0-10   General Medical Exam:   General:  Well appearing, comfortable.   Eyes/ENT: see cranial nerve examination.   Neck: No masses appreciated.  Full range of motion without tenderness.  No carotid bruits. Respiratory:  Clear to auscultation, good air entry bilaterally.   Cardiac:  Regular rate and rhythm, no murmur.   Extremities:  No deformities, edema, or skin discoloration.  Skin:  No rashes or lesions.  Neurological Exam: MENTAL STATUS including orientation to time, place, person, recent and remote memory, attention span and concentration, language, and fund of knowledge is normal.  Speech is not dysarthric.  CRANIAL NERVES: II:  No visual field defects.  Unremarkable fundi.   III-IV-VI: Pupils equal round and reactive to light.  Normal conjugate, extra-ocular eye movements in all directions of gaze.  No nystagmus. Moderate left ptosis (old).   V:  Normal facial sensation.     VII:  Normal facial symmetry and movements.   VIII:  Normal hearing and vestibular function.     IX-X:  Normal  palatal movement.   XI:  Normal shoulder shrug and head rotation.   XII:  Normal tongue strength and range of motion, no deviation or fasciculation.  MOTOR:  No atrophy, fasciculations or abnormal movements.  No pronator drift.  Tone is normal.    Right Upper Extremity:    Left Upper Extremity:    Deltoid  5/5   Deltoid  5/5   Biceps  5/5   Biceps  5/5   Triceps  5/5   Triceps  5/5   Wrist extensors  5/5   Wrist extensors  5/5   Wrist flexors  5/5   Wrist flexors  5/5   Finger extensors  5/5   Finger extensors  5/5   Finger flexors  5/5   Finger flexors  5/5   Dorsal interossei  5/5   Dorsal interossei  5/5   Abductor pollicis  5/5   Abductor pollicis  5/5   Tone (Ashworth scale)  0  Tone (Ashworth scale)  0   Right Lower Extremity:    Left Lower Extremity:    Hip flexors  5/5   Hip flexors  5/5   Hip extensors  5/5   Hip extensors  5/5   Knee flexors  5/5   Knee flexors  5/5   Knee extensors  5/5   Knee extensors  5/5   Eversion 5-/5  Eversion 4/5  inversion 5/5  Inversion 4/5  Dorsiflexors  5/5   Dorsiflexors  4/5   Plantarflexors  5/5   Plantarflexors  5/5   Toe extensors  5-/5   Toe extensors  4/5   Toe flexors  5-/5   Toe flexors  5-/5   Tone (Ashworth scale)  0  Tone (Ashworth scale)  0   MSRs:  Right                                                                 Left brachioradialis 2+  brachioradialis 2+  biceps 2+  biceps 2+  triceps 1+  triceps 1+  patellar 0  patellar 0  ankle jerk 0  ankle jerk 0  Hoffman no  Hoffman no  plantar response down  plantar response down   SENSORY:  Absent vibration distal to right knee, temperature and pin prick is reduced distal to lower leg bilaterally.  Sensation intact in the upper extremities. There is very mild, if any, sway with Rhomberg testing.   COORDINATION/GAIT: Normal finger-to- nose-finger.  Intact rapid alternating movements bilaterally.  Able to rise from a chair without using arms.  Gait narrow based  and stable, there is mild high steppage on the left.  There is mild unsteadiness with tandem gait, but he is able to perform it.  He is able to stand on toes, but has great difficulty standing on heels, especially on the left.   IMPRESSION/PLAN: 1.  Guillain-Barre Syndrome (May 2017), treated with plasmapheresis and doing exceeding well.  Clinically, he continues to have distal paresthesias which seems to be his baseline diabetic neuropathy and left foot drop which is also old and likely due to L5 radiculopathy.  He will continue out-patient PT as well as home exercises to strengthen his legs and balance.  At this time, his balance also appears quite good.  He  has restarted driving locally and there are no safety concerns with his wife or patient.  He is very careful about knowing his own limitations and feels comfortable manipulating the pedals since his sensation as returned.  He is able to detect pressure quite well and is back to his usual level of neuropathy, so he may resume driving.  If there is any new concern regarding his ability to control the pedals, he will restrict driving and contact my office.  Do not recommend flu vaccination at least for this year.  The data on flu vaccination following GBS remains controversial.   2.  Low blood pressure, asymptomatic.  Recommend he continue to monitor his blood pressure at home and to share the recording with his PCP since he may need to have it adjusted as to avoid overtreating his BP  3.  Diabetic polyneuropathy affecting the feet, stable  4.  Left foot drop due to L5 radiculopathy, stable.  Ankle foot orthotic was offered, but he would like to hold off on this for now.   Return to clinic in 6 months or sooner as needed   The duration of this appointment visit was 60 minutes of face-to-face time with the patient.  Greater than 50% of this time was spent in counseling, explanation of diagnosis, planning of further management, and coordination of  care.   Thank you for allowing me to participate in patient's care.  If I can answer any additional questions, I would be pleased to do so.    Sincerely,    Rosie Golson K. Posey Pronto, DO

## 2015-11-24 NOTE — Therapy (Signed)
[facility] [address] [place], [place], [address] Phone: [phone]   Fax:  [phone]  Physical Therapy Treatment  Patient Details  Name: Tanner Mason MRN: [id number] Date of Birth: [birth year] Referring Provider: Delice Lesch, MD  Encounter Date: 11/23/2015      PT End of Session - 11/23/15 1023    Visit Number 12   Number of Visits 17   Date for PT Re-Evaluation 12/11/15   Authorization Type [employer] MDC-G Code on every 10th visit   PT Start Time 1020   PT Stop Time 1100   PT Time Calculation (min) 40 min   Equipment Utilized During Treatment Gait belt   Activity Tolerance Patient tolerated treatment well   Behavior During Therapy [facility] for tasks assessed/performed      Past Medical History:  Diagnosis Date  . Abdominal ultrasound, abnormal 02/2004   fatty liver  . Diabetes mellitus without complication ([place])   . Elbow dislocation [id number]   Right  . Encounter for diagnostic endoscopy 10/22/04   negative  . History of esophagogastroduodenoscopy 08/13/2007   normal  . Treadmill stress test negative for angina pectoris 06/2002   poor HR and BP recovery    Past Surgical History:  Procedure Laterality Date  . ANTERIOR CRUCIATE LIGAMENT REPAIR  5/98   Left, staph infection complicated  . BASAL CELL CARCINOMA EXCISION  02/2005   R ear  . BLEPHAROPLASTY  03/2005   with ptosis repair  . INGUINAL HERNIA REPAIR Right 1947  . INGUINAL HERNIA REPAIR Left 1970    There were no vitals filed for this visit.      Subjective Assessment - 11/23/15 1022    Subjective No new complaints. No falls to report. Did take it easy yesterday (I took the day off) and feels better today.   Patient is accompained by: Family member   Pertinent History DM with peripheral neruropathy, hx of LBP w/ radiculopathy   Limitations Walking;House hold activities;Standing   Patient Stated Goals To walk again normally and [place]    Currently in Pain? No/denies   Pain Score 0-No pain          [facility] Adult PT Treatment/Exercise - 11/23/15 1024      Transfers   Transfers Sit to Stand;Stand to Sit     Ambulation/Gait   Ambulation/Gait Yes   Ambulation/Gait Assistance 5: Supervision;6: Modified independent (Device/Increase time)   Ambulation Distance (Feet) 1000 Feet   Assistive device None   Gait Pattern Step-through pattern;Decreased arm swing - right;Decreased arm swing - left;Decreased stride length   Ambulation Surface Level;Unlevel;Indoor;Outdoor;Grass;Paved;Gravel             Balance Exercises - 11/23/15 1042      Balance Exercises: Standing   Standing Eyes Closed Narrow base of support (BOS);Foam/compliant surface;Other reps (comment);20 secs;Limitations   Gait with Head Turns Forward;2 reps;Limitations   Other Standing Exercises inverted BOSU ball: rocking fwd/bwd and laterally x 10 each way; minisquats x 10 reps. No UE support with min guard to min assist for balance.                              Balance Exercises: Standing   Standing Eyes Closed Limitations blue mat over ramp: facing both ways on ramp with feet together: EC no head movements, EC head movements up<>down, left<>right and diagonals both ways x 10 reps each.  min guard to min assist for balance   Tandem Gait Limitations along 50 foot hallway: gait with head movements up<>down and left<>right x 2 laps each with min guard assist to min assist. veering noted at times, no overt loss of balance.             PT Short Term Goals - 11/06/15 1150      PT SHORT TERM GOAL #1   Title Pt will initiate HEP for BLE strengthening and balance in order to decrease fall risk and improve functional mobility.  (Target Date: 11/09/15)   Baseline met 11/06/15   Status Achieved     PT SHORT TERM GOAL #2   Title Pt wil improve gait speed to 2.41 ft/sec in order to decrease fall risk and improve efficiency of gait.      Baseline 2.67 w/ SPC on 11/06/15   Status Achieved     PT SHORT TERM GOAL #3   Title Pt wil improve BERG balance score to 31/56 in order to demonstrate decreased fall risk.     Baseline 50/56 on 11/06/15   Status Achieved     PT SHORT TERM GOAL #4   Title Will assess 6MWT and improve distance by 150' in order to demonstrate improved functional endurance.     Baseline baseline was 870' and on 11/06/15 was 1078   Status Achieved     PT SHORT TERM GOAL #5   Title Pt will ambulate >200' over indoor surfaces w/ SPC at S level in order to demonstrate increased independence with gait.     Baseline met 11/06/15   Status Achieved           PT Long Term Goals - 11/09/15 1032      PT LONG TERM GOAL #1   Title Pt will be independent with HEP in order to demonstrate improved functional mobility and decreased fall risk.  (Target Date: 12/07/15)   Status New     PT LONG TERM GOAL #2   Title Pt will improve gait speed to >2.62 ft/sec without device in order to indicate pt is community ambulator.     Baseline updated due to progress   Status Revised     PT LONG TERM GOAL #3   Title Pt will increase BERG balance score to 37/56 in order to indicate decreased fall risk.    Baseline deferred due to progress, see goal for FGA.    Status Deferred     PT LONG TERM GOAL #4   Title Pt will verbalize particpation in walking program/return to community fitness in order to maintain and progress endurance.     Status New     PT LONG TERM GOAL #5   Title Pt will ambulate without device over outdoor surfaces (paved and grass) independently in order to demonstrate return to community negotiation.     Baseline updated due to progress, met as of 11/09/15   Status Achieved     PT LONG TERM GOAL #6   Title Pt will ambulate up/down grassy hill without device independently in order to indicate return to [place] house/boat.     Baseline updated due to progress   Status Revised     PT LONG TERM GOAL #7   Title  Pt will improve FGA score to 23/30 in order to indicate decreased fall risk.     Status New            Plan - 11/23/15 1023    Clinical Impression  Statement Today's session continued to address higher level balance activities. Pt is progressing well and on target to meet LTGs for discharge next week.    Rehab Potential Good   Clinical Impairments Affecting Rehab Potential co-morbidities   PT Frequency 2x / week   PT Duration 8 weeks   PT Treatment/Interventions ADLs/Self Care Home Management;Electrical Stimulation;DME Instruction;Gait training;Stair training;Functional mobility training;Therapeutic activities;Therapeutic exercise;Balance training;Neuromuscular re-education;Patient/family education;Orthotic Fit/Training;Energy conservation;Vestibular   PT Next Visit Plan check LTGs, for discharge Raquel Sarna to do when she returns)   Consulted and Agree with Plan of Care Patient;Family member/caregiver   Family Member Consulted wife Marlowe Kays      Patient will benefit from skilled therapeutic intervention in order to improve the following deficits and impairments:  Abnormal gait, Decreased activity tolerance, Decreased balance, Decreased endurance, Decreased knowledge of precautions, Decreased mobility, Decreased safety awareness, Decreased strength, Difficulty walking, Dizziness, Impaired perceived functional ability, Impaired sensation, Improper body mechanics, Postural dysfunction  Visit Diagnosis: Unsteadiness on feet  Muscle weakness (generalized)  Other abnormalities of gait and mobility  Other disturbances of skin sensation     Problem List Patient Active Problem List   Diagnosis Date Noted  . Diabetic polyneuropathy associated with diabetes mellitus due to underlying condition ([place]) 11/23/2015  . Irregular bowel habits   . Constipation   . Thrombocytopenia ([place])   . Acute on chronic renal insufficiency ([facility])   . Normocytic anemia   . Idiopathic angioedema   . Back pain    . Hypomagnesemia   . Type 2 diabetes mellitus with diabetic neuropathy, unspecified ([place])   . Guillain Barr syndrome ([place])   . Weakness 09/17/2015  . Gait abnormality 09/17/2015  . Gait instability 09/17/2015  . Cellulitis and abscess 07/14/2015  . Preventative health care 11/08/2014  . Angio-edema 07/14/2014  . Angioedema 07/13/2014  . Left foot drop 04/05/2014  . Lumbar back pain with radiculopathy affecting left lower extremity 04/05/2014  . Atypical chest pain 12/14/2013  . Hypokalemia 03/16/2013  . Overweight (BMI 25.0-29.9) 09/15/2012  . Stage 3 chronic renal impairment associated with type 2 diabetes mellitus ([place]) 07/20/2010  . ULNAR NERVE ENTRAPMENT, RIGHT 06/07/2008  . Diabetes mellitus ([place]) 12/09/2007  . Essential hypertension, benign 12/08/2007  . DIASTASIS RECTI 12/08/2007  . HYPERLIPIDEMIA 06/26/2006  . ANEMIA, OTHER, UNSPECIFIED 06/26/2006  . Peripheral autonomic neuropathy due to diabetes mellitus ([place]) 06/26/2006  . GASTROESOPHAGEAL REFLUX DISEASE, CHRONIC 06/26/2006  . Enlarged prostate with lower urinary tract symptoms (LUTS) 06/26/2006    Willow Ora, PTA, [facility] [address] [place], [place] [address] [phone] 11/24/15, 1:58 PM   Name: MOUSSA WIEGAND MRN: [id number] Date of Birth: 04/04/1941

## 2015-11-28 ENCOUNTER — Encounter: Payer: Medicare Other | Admitting: Occupational Therapy

## 2015-11-28 ENCOUNTER — Encounter: Payer: Self-pay | Admitting: Physical Therapy

## 2015-11-28 ENCOUNTER — Ambulatory Visit: Payer: Medicare Other | Attending: Physical Medicine & Rehabilitation | Admitting: Physical Therapy

## 2015-11-28 DIAGNOSIS — M6281 Muscle weakness (generalized): Secondary | ICD-10-CM | POA: Insufficient documentation

## 2015-11-28 DIAGNOSIS — R2681 Unsteadiness on feet: Secondary | ICD-10-CM | POA: Diagnosis present

## 2015-11-28 DIAGNOSIS — R2689 Other abnormalities of gait and mobility: Secondary | ICD-10-CM | POA: Insufficient documentation

## 2015-11-29 NOTE — Therapy (Signed)
[facility] [address] [place], [place], [address] Phone: [phone]   Fax:  [phone]  Physical Therapy Treatment  Patient Details  Name: Tanner Mason MRN: [id number] Date of Birth: 02/18/1941 Referring Provider: Maryla Morrow, MD  Encounter Date: 11/28/2015      PT End of Session - 11/28/15 1121    Visit Number 13   Number of Visits 17   Date for PT Re-Evaluation 12/11/15   Authorization Type UHC MDC-G Code on every 10th visit   PT Start Time 1100   PT Stop Time 1145   PT Time Calculation (min) 45 min   Equipment Utilized During Treatment Gait belt   Activity Tolerance Patient tolerated treatment well   Behavior During Therapy [facility] for tasks assessed/performed      Past Medical History:  Diagnosis Date  . Abdominal ultrasound, abnormal 02/2004   fatty liver  . Diabetes mellitus without complication (HCC)   . Elbow dislocation [id number]   Right  . Encounter for diagnostic endoscopy 10/22/04   negative  . History of esophagogastroduodenoscopy 08/13/2007   normal  . Treadmill stress test negative for angina pectoris 06/2002   poor HR and BP recovery    Past Surgical History:  Procedure Laterality Date  . ANTERIOR CRUCIATE LIGAMENT REPAIR  5/98   Left, staph infection complicated  . BASAL CELL CARCINOMA EXCISION  02/2005   R ear  . BLEPHAROPLASTY  03/2005   with ptosis repair  . INGUINAL HERNIA REPAIR Right 1947  . INGUINAL HERNIA REPAIR Left 1970    There were no vitals filed for this visit.      Subjective Assessment - 11/28/15 1115    Subjective No new complaints. No falls to report. Continues to feel as if his balance is getting worse.    Patient is accompained by: Family member   Pertinent History DM with peripheral neruropathy, hx of LBP w/ radiculopathy   Limitations Walking;House hold activities;Standing   Patient Stated Goals To walk again normally and [place]    Currently in Pain? No/denies            [facility] PT Assessment - 11/28/15 1132      Ambulation/Gait   Gait velocity 12.75 sec's no AD= 2.6 ft/sec     Functional Gait  Assessment   Gait assessed  Yes   Gait Level Surface Walks 20 ft in less than 7 sec but greater than 5.5 sec, uses assistive device, slower speed, mild gait deviations, or deviates 6-10 in outside of the 12 in walkway width.   Change in Gait Speed Able to smoothly change walking speed without loss of balance or gait deviation. Deviate no more than 6 in outside of the 12 in walkway width.   Gait with Horizontal Head Turns Performs head turns smoothly with no change in gait. Deviates no more than 6 in outside 12 in walkway width   Gait with Vertical Head Turns Performs head turns with no change in gait. Deviates no more than 6 in outside 12 in walkway width.   Gait and Pivot Turn Pivot turns safely within 3 sec and stops quickly with no loss of balance.   Step Over Obstacle Is able to step over 2 stacked shoe boxes taped together (9 in total height) without changing gait speed. No evidence of imbalance.   Gait with Narrow Base of Support Ambulates 7-9 steps.   Gait with Eyes Closed Walks 20  ft, uses assistive device, slower speed, mild gait deviations, deviates 6-10 in outside 12 in walkway width. Ambulates 20 ft in less than 9 sec but greater than 7 sec.   Ambulating Backwards Walks 20 ft, uses assistive device, slower speed, mild gait deviations, deviates 6-10 in outside 12 in walkway width.   Steps Alternating feet, no rail.   Total Score 26   FGA comment: < 19 = high risk fall            [facility] Adult PT Treatment/Exercise - 11/28/15 1122      Transfers   Transfers Sit to Stand;Stand to Sit   Sit to Stand 6: Modified independent (Device/Increase time);From bed;From chair/3-in-1;With upper extremity assist;Without upper extremity assist   Stand to Sit 6: Modified independent (Device/Increase time);With  upper extremity assist;Without upper extremity assist;To bed;To chair/3-in-1     Ambulation/Gait   Ambulation/Gait Yes   Ambulation/Gait Assistance 6: Modified independent (Device/Increase time)   Ambulation/Gait Assistance Details no balance issues noted, slight increase in left foot slap at distance progressed   Ambulation Distance (Feet) 1000 Feet   Assistive device None   Gait Pattern Step-through pattern;Decreased stride length   Ambulation Surface Level;Unlevel;Indoor;Outdoor;Paved;Grass             PT Short Term Goals - 11/06/15 1150      PT SHORT TERM GOAL #1   Title Pt will initiate HEP for BLE strengthening and balance in order to decrease fall risk and improve functional mobility.  (Target Date: 11/09/15)   Baseline met 11/06/15   Status Achieved     PT SHORT TERM GOAL #2   Title Pt wil improve gait speed to 2.41 ft/sec in order to decrease fall risk and improve efficiency of gait.     Baseline 2.67 w/ SPC on 11/06/15   Status Achieved     PT SHORT TERM GOAL #3   Title Pt wil improve BERG balance score to 31/56 in order to demonstrate decreased fall risk.     Baseline 50/56 on 11/06/15   Status Achieved     PT SHORT TERM GOAL #4   Title Will assess 6MWT and improve distance by 150' in order to demonstrate improved functional endurance.     Baseline baseline was 870' and on 11/06/15 was 1078   Status Achieved     PT SHORT TERM GOAL #5   Title Pt will ambulate >200' over indoor surfaces w/ SPC at S level in order to demonstrate increased independence with gait.     Baseline met 11/06/15   Status Achieved           PT Long Term Goals - 11/28/15 1121      PT LONG TERM GOAL #1   Title Pt will be independent with HEP in order to demonstrate improved functional mobility and decreased fall risk.  (Target Date: 12/07/15)   Status On-going     PT LONG TERM GOAL #2   Title Pt will improve gait speed to >2.62 ft/sec without device in order to indicate pt is community  ambulator.     Baseline 11/28/15: 2.60 ft/sec without AD.   Status Partially Met     PT LONG TERM GOAL #3   Title Pt will increase BERG balance score to 37/56 in order to indicate decreased fall risk.    Baseline deferred due to progress, see goal for FGA.    Status Deferred     PT LONG TERM GOAL #4   Title Pt will verbalize  particpation in walking program/return to community fitness in order to maintain and progress endurance.     Baseline 11/28/15: pt is currently going to gym 3x a week, in addition to his HEP.   Status Achieved     PT LONG TERM GOAL #5   Title Pt will ambulate without device over outdoor surfaces (paved and grass) independently in order to demonstrate return to community negotiation.     Baseline updated due to progress, met as of 11/09/15   Status Achieved     PT LONG TERM GOAL #6   Title Pt will ambulate up/down grassy hill without device independently in order to indicate return to [place] house[place].     Baseline 11/28/15: pt/spouse both report no issues with negotiating hill at [place].   Status Achieved     PT LONG TERM GOAL #7   Title Pt will improve FGA score to 23/30 in order to indicate decreased fall risk.     Baseline 11/28/15: scored 26/30   Status Achieved              Plan - 11/28/15 1121    Clinical Impression Statement Pt has met most all of his LTGs. Will address remaining goals next visit with plan to discharge at that time per PT plan of care. Pt still not satisfied with current level, mostly balance despite being at baseline level per both spouse and himself. May need to perform Berg balance test despite being deferred just so pt has visual reference of his improvements.                                      Rehab Potential Good   Clinical Impairments Affecting Rehab Potential co-morbidities   PT Frequency 2x / week   PT Duration 8 weeks   PT Treatment/Interventions ADLs/Self Care Home Management;Electrical Stimulation;DME Instruction;Gait  training;Stair training;Functional mobility training;Therapeutic activities;Therapeutic exercise;Balance training;Neuromuscular re-education;Patient/family education;Orthotic Fit/Training;Energy conservation;Vestibular   PT Next Visit Plan check LTGs, for discharge Raquel Sarna to do when she returns)   Consulted and Agree with Plan of Care Patient;Family member/caregiver   Family Member Consulted wife Marlowe Kays      Patient will benefit from skilled therapeutic intervention in order to improve the following deficits and impairments:  Abnormal gait, Decreased activity tolerance, Decreased balance, Decreased endurance, Decreased knowledge of precautions, Decreased mobility, Decreased safety awareness, Decreased strength, Difficulty walking, Dizziness, Impaired perceived functional ability, Impaired sensation, Improper body mechanics, Postural dysfunction  Visit Diagnosis: Unsteadiness on feet  Muscle weakness (generalized)  Other abnormalities of gait and mobility     Problem List Patient Active Problem List   Diagnosis Date Noted  . Diabetic polyneuropathy associated with diabetes mellitus due to underlying condition ([place]) 11/23/2015  . Irregular bowel habits   . Constipation   . Thrombocytopenia ([place])   . Acute on chronic renal insufficiency (HCC)   . Normocytic anemia   . Idiopathic angioedema   . Back pain   . Hypomagnesemia   . Type 2 diabetes mellitus with diabetic neuropathy, unspecified ([place])   . Guillain Barr syndrome ([place])   . Weakness 09/17/2015  . Gait abnormality 09/17/2015  . Gait instability 09/17/2015  . Cellulitis and abscess 07/14/2015  . Preventative health care 11/08/2014  . Angio-edema 07/14/2014  . Angioedema 07/13/2014  . Left foot drop 04/05/2014  . Lumbar back pain with radiculopathy affecting left lower extremity 04/05/2014  . Atypical chest pain 12/14/2013  .  Hypokalemia 03/16/2013  . Overweight (BMI 25.0-29.9) 09/15/2012  . Stage 3 chronic renal  impairment associated with type 2 diabetes mellitus ([place]) 07/20/2010  . ULNAR NERVE ENTRAPMENT, RIGHT 06/07/2008  . Diabetes mellitus ([place]) 12/09/2007  . Essential hypertension, benign 12/08/2007  . DIASTASIS RECTI 12/08/2007  . HYPERLIPIDEMIA 06/26/2006  . ANEMIA, OTHER, UNSPECIFIED 06/26/2006  . Peripheral autonomic neuropathy due to diabetes mellitus ([place]) 06/26/2006  . GASTROESOPHAGEAL REFLUX DISEASE, CHRONIC 06/26/2006  . Enlarged prostate with lower urinary tract symptoms (LUTS) 06/26/2006    Willow Ora, PTA, [facility] [address] [place], [place] [address] [phone] 11/29/15, 11:59 AM   Name: KOLYN ROZARIO MRN: [id number] Date of Birth: 1941/04/08

## 2015-11-30 ENCOUNTER — Encounter: Payer: Medicare Other | Admitting: Occupational Therapy

## 2015-11-30 ENCOUNTER — Encounter: Payer: Self-pay | Admitting: Physical Therapy

## 2015-11-30 ENCOUNTER — Ambulatory Visit: Payer: Medicare Other | Admitting: Physical Therapy

## 2015-11-30 DIAGNOSIS — R2681 Unsteadiness on feet: Secondary | ICD-10-CM | POA: Diagnosis not present

## 2015-11-30 DIAGNOSIS — R2689 Other abnormalities of gait and mobility: Secondary | ICD-10-CM

## 2015-11-30 DIAGNOSIS — M6281 Muscle weakness (generalized): Secondary | ICD-10-CM

## 2015-11-30 NOTE — Therapy (Signed)
[place] [address] [place], [place], [address] Phone: [phone]   Fax:  [phone]  Physical Therapy Treatment  Patient Details  Name: Tanner Mason MRN: [id number] Date of Birth: 1941-01-22 Referring Provider: Delice Lesch, MD  Encounter Date: 11/30/2015      PT End of Session - 11/30/15 0942    Visit Number 14   Number of Visits 17   Date for PT Re-Evaluation 12/11/15   Authorization Type UHC MDC-G Code on every 10th visit   PT Start Time [phone]  pt late for appt   PT Stop Time 1015   PT Time Calculation (min) 38 min   Equipment Utilized During Treatment Gait belt   Activity Tolerance Patient tolerated treatment well   Behavior During Therapy [facility] for tasks assessed/performed      Past Medical History:  Diagnosis Date  . Abdominal ultrasound, abnormal 02/2004   fatty liver  . Diabetes mellitus without complication ([place])   . Elbow dislocation 9937,1696   Right  . Encounter for diagnostic endoscopy 10/22/04   negative  . History of esophagogastroduodenoscopy 08/13/2007   normal  . Treadmill stress test negative for angina pectoris 06/2002   poor HR and BP recovery    Past Surgical History:  Procedure Laterality Date  . ANTERIOR CRUCIATE LIGAMENT REPAIR  5/98   Left, staph infection complicated  . BASAL CELL CARCINOMA EXCISION  02/2005   R ear  . BLEPHAROPLASTY  03/2005   with ptosis repair  . INGUINAL HERNIA REPAIR Right 1947  . INGUINAL HERNIA REPAIR Left 1970    There were no vitals filed for this visit.      Subjective Assessment - 11/30/15 0938    Subjective No new compaints. No falls or pain.    Pertinent History DM with peripheral neruropathy, hx of LBP w/ radiculopathy   Limitations Walking;House hold activities;Standing   Patient Stated Goals To walk again normally [place]    Currently in Pain? No/denies   Pain Score 0-No pain              [facility] Adult PT Treatment/Exercise - 11/30/15 0953      Transfers   Transfers Sit to Stand;Stand to Sit   Sit to Stand 6: Modified independent (Device/Increase time)   Stand to Sit 6: Modified independent (Device/Increase time)     Ambulation/Gait   Ambulation/Gait Yes   Ambulation/Gait Assistance 6: Modified independent (Device/Increase time)   Ambulation Distance (Feet) 1200 Feet   Assistive device None   Gait Pattern Step-through pattern;Decreased stride length   Ambulation Surface Unlevel;Level;Indoor;Outdoor;Paved;Grass   Gait velocity 10.25 sec= 3.20 ft/sec with no AD     Berg Balance Test   Sit to Stand Able to stand without using hands and stabilize independently   Standing Unsupported Able to stand safely 2 minutes   Sitting with Back Unsupported but Feet Supported on Floor or Stool Able to sit safely and securely 2 minutes   Stand to Sit Sits safely with minimal use of hands   Transfers Able to transfer safely, minor use of hands   Standing Unsupported with Eyes Closed Able to stand 10 seconds safely   Standing Ubsupported with Feet Together Able to place feet together independently and stand 1 minute safely   From Standing, Reach Forward with Outstretched Arm Can reach confidently >25 cm (10")   From Standing Position, Pick up Object from Floor Able to pick up shoe  safely and easily   From Standing Position, Turn to Look Behind Over each Shoulder Looks behind from both sides and weight shifts well   Turn 360 Degrees Able to turn 360 degrees safely in 4 seconds or less  </= 3.19 sec's both ways   Standing Unsupported, Alternately Place Feet on Step/Stool Able to stand independently and safely and complete 8 steps in 20 seconds  8.84   Standing Unsupported, One Foot in Front Able to plae foot ahead of the other independently and hold 30 seconds   Standing on One Leg Tries to lift leg/unable to hold 3 seconds but remains standing independently   Total Score 52             PT Short Term Goals - 11/06/15 1150      PT SHORT TERM GOAL #1   Title Pt will initiate HEP for BLE strengthening and balance in order to decrease fall risk and improve functional mobility.  (Target Date: 11/09/15)   Baseline met 11/06/15   Status Achieved     PT SHORT TERM GOAL #2   Title Pt wil improve gait speed to 2.41 ft/sec in order to decrease fall risk and improve efficiency of gait.     Baseline 2.67 w/ SPC on 11/06/15   Status Achieved     PT SHORT TERM GOAL #3   Title Pt wil improve BERG balance score to 31/56 in order to demonstrate decreased fall risk.     Baseline 50/56 on 11/06/15   Status Achieved     PT SHORT TERM GOAL #4   Title Will assess 6MWT and improve distance by 150' in order to demonstrate improved functional endurance.     Baseline baseline was 870' and on 11/06/15 was 1078   Status Achieved     PT SHORT TERM GOAL #5   Title Pt will ambulate >200' over indoor surfaces w/ SPC at S level in order to demonstrate increased independence with gait.     Baseline met 11/06/15   Status Achieved           PT Long Term Goals - 11/30/15 0944      PT LONG TERM GOAL #1   Title Pt will be independent with HEP in order to demonstrate improved functional mobility and decreased fall risk.  (Target Date: 12/07/15)   Baseline 11/30/15: met today. (see note for discussion)   Status Achieved     PT LONG TERM GOAL #2   Title Pt will improve gait speed to >2.62 ft/sec without device in order to indicate pt is community ambulator.     Status Partially Met     PT LONG TERM GOAL #3   Title Pt will increase BERG balance score to 37/56 in order to indicate decreased fall risk.    Baseline deferred due to progress, see goal for FGA.    Status Deferred     PT LONG TERM GOAL #4   Title Pt will verbalize particpation in walking program/return to community fitness in order to maintain and progress endurance.     Baseline 11/28/15: pt is currently going to gym 3x a  week, in addition to his HEP.   Status Achieved     PT LONG TERM GOAL #5   Title Pt will ambulate without device over outdoor surfaces (paved and grass) independently in order to demonstrate return to community negotiation.     Baseline updated due to progress, met as of 11/09/15   Status Achieved  PT LONG TERM GOAL #6   Title Pt will ambulate up/down grassy hill without device independently in order to [place].     Baseline 11/28/15: pt/spouse both report no issues with negotiating hill at [place].   Status Achieved     PT LONG TERM GOAL #7   Title Pt will improve FGA score to 23/30 in order to indicate decreased fall risk.     Baseline 11/28/15: scored 26/30   Status Achieved            Plan - 2015/12/20 0943    Clinical Impression Statement Pt has met all remaining LTGs and is agreeable to discharge. Extensive amount of time spend on discussion of community fitness, safety with exercises and how to progress when the ex's get too easy.    Rehab Potential Good   Clinical Impairments Affecting Rehab Potential co-morbidities   PT Frequency 2x / week   PT Duration 8 weeks   PT Treatment/Interventions ADLs/Self Care Home Management;Electrical Stimulation;DME Instruction;Gait training;Stair training;Functional mobility training;Therapeutic activities;Therapeutic exercise;Balance training;Neuromuscular re-education;Patient/family education;Orthotic Fit/Training;Energy conservation;Vestibular   PT Next Visit Plan discharge per PT plan of care   Consulted and Agree with Plan of Care Patient;Family member/caregiver   Family Member Consulted wife Marlowe Kays      Patient will benefit from skilled therapeutic intervention in order to improve the following deficits and impairments:  Abnormal gait, Decreased activity tolerance, Decreased balance, Decreased endurance, Decreased knowledge of precautions, Decreased mobility, Decreased safety awareness, Decreased strength,  Difficulty walking, Dizziness, Impaired perceived functional ability, Impaired sensation, Improper body mechanics, Postural dysfunction  Visit Diagnosis: Unsteadiness on feet  Muscle weakness (generalized)  Other abnormalities of gait and mobility       G-Codes - 12/20/15 1303    Functional Assessment Tool Used Berg 52/56, FGA 26/30   Functional Limitation Mobility: Walking and moving around      Problem List Patient Active Problem List   Diagnosis Date Noted  . Diabetic polyneuropathy associated with diabetes mellitus due to underlying condition ([place]) 11/23/2015  . Irregular bowel habits   . Constipation   . Thrombocytopenia ([place])   . Acute on chronic renal insufficiency (HCC)   . Normocytic anemia   . Idiopathic angioedema   . Back pain   . Hypomagnesemia   . Type 2 diabetes mellitus with diabetic neuropathy, unspecified ([place])   . Guillain Barr syndrome ([place])   . Weakness 09/17/2015  . Gait abnormality 09/17/2015  . Gait instability 09/17/2015  . Cellulitis and abscess 07/14/2015  . Preventative health care 11/08/2014  . Angio-edema 07/14/2014  . Angioedema 07/13/2014  . Left foot drop 04/05/2014  . Lumbar back pain with radiculopathy affecting left lower extremity 04/05/2014  . Atypical chest pain 12/14/2013  . Hypokalemia 03/16/2013  . Overweight (BMI 25.0-29.9) 09/15/2012  . Stage 3 chronic renal impairment associated with type 2 diabetes mellitus ([place]) 07/20/2010  . ULNAR NERVE ENTRAPMENT, RIGHT 06/07/2008  . Diabetes mellitus ([place]) 12/09/2007  . Essential hypertension, benign 12/08/2007  . DIASTASIS RECTI 12/08/2007  . HYPERLIPIDEMIA 06/26/2006  . ANEMIA, OTHER, UNSPECIFIED 06/26/2006  . Peripheral autonomic neuropathy due to diabetes mellitus ([place]) 06/26/2006  . GASTROESOPHAGEAL REFLUX DISEASE, CHRONIC 06/26/2006  . Enlarged prostate with lower urinary tract symptoms (LUTS) 06/26/2006    Willow Ora, PTA, [facility] [address], [place], [place] [address] [phone] 12-20-2015, 1:04 PM   Name: ANTWIONE PICOTTE MRN: [id number] Date of Birth: Sep 26, 1940

## 2015-12-04 ENCOUNTER — Encounter: Payer: Self-pay | Admitting: Rehabilitation

## 2015-12-04 NOTE — Therapy (Signed)
[place] [address] [place], [place], [address] Phone: [phone]   Fax:  [phone]  Patient Details  Name: Tanner Mason MRN: [id number] Date of Birth: 06/29/1940 Referring Provider:  No ref. provider found  Encounter Date: 12/04/2015   PHYSICAL THERAPY DISCHARGE SUMMARY  Visits from Start of Care: 14  Current functional level related to goals / functional outcomes:     PT Long Term Goals - 11/30/15 0944      PT LONG TERM GOAL #1   Title Pt will be independent with HEP in order to demonstrate improved functional mobility and decreased fall risk.  (Target Date: 12/07/15)   Baseline 11/30/15: met today. (see note for discussion)   Status Achieved     PT LONG TERM GOAL #2   Title Pt will improve gait speed to >2.62 ft/sec without device in order to indicate pt is community ambulator.     Status Partially Met     PT LONG TERM GOAL #3   Title Pt will increase BERG balance score to 37/56 in order to indicate decreased fall risk.    Baseline deferred due to progress, see goal for FGA.    Status Deferred     PT LONG TERM GOAL #4   Title Pt will verbalize particpation in walking program/return to community fitness in order to maintain and progress endurance.     Baseline 11/28/15: pt is currently going to gym 3x a week, in addition to his HEP.   Status Achieved     PT LONG TERM GOAL #5   Title Pt will ambulate without device over outdoor surfaces (paved and grass) independently in order to demonstrate return to community negotiation.     Baseline updated due to progress, met as of 11/09/15   Status Achieved     PT LONG TERM GOAL #6   Title Pt will ambulate up/down grassy hill without device independently in order to indicate [place].     Baseline 11/28/15: pt/spouse both report no issues with negotiating hill at [place].   Status Achieved     PT LONG TERM GOAL #7   Title Pt will improve FGA  score to 23/30 in order to indicate decreased fall risk.     Baseline 11/28/15: scored 26/30   Status Achieved        Remaining deficits: Pt has met all goals, has very minor high level balance deficits.    Education / Equipment: HEP  Plan: Patient agrees to discharge.  Patient goals were met. Patient is being discharged due to meeting the stated rehab goals.  ?????        Cameron Sprang, PT, MPT [facility] [address] [place], [place], [address] Phone: [phone]   Fax:  [phone] 12/04/15, 7:22 PM

## 2015-12-05 ENCOUNTER — Encounter: Payer: Medicare Other | Admitting: Occupational Therapy

## 2015-12-05 ENCOUNTER — Ambulatory Visit: Payer: Medicare Other | Admitting: Physical Therapy

## 2015-12-07 ENCOUNTER — Encounter: Payer: Medicare Other | Admitting: Occupational Therapy

## 2015-12-07 ENCOUNTER — Ambulatory Visit: Payer: Medicare Other | Admitting: Rehabilitative and Restorative Service Providers"

## 2015-12-12 ENCOUNTER — Encounter: Payer: Medicare Other | Admitting: Occupational Therapy

## 2015-12-12 ENCOUNTER — Ambulatory Visit: Payer: Medicare Other | Admitting: Physical Therapy

## 2015-12-14 ENCOUNTER — Ambulatory Visit: Payer: Medicare Other | Admitting: Rehabilitation

## 2015-12-14 ENCOUNTER — Encounter: Payer: Medicare Other | Admitting: Occupational Therapy

## 2015-12-21 ENCOUNTER — Encounter: Payer: Self-pay | Admitting: Family Medicine

## 2015-12-21 ENCOUNTER — Ambulatory Visit (INDEPENDENT_AMBULATORY_CARE_PROVIDER_SITE_OTHER): Payer: Medicare Other | Admitting: Family Medicine

## 2015-12-21 VITALS — BP 123/69 | HR 78 | Temp 97.9°F | Wt 161.6 lb

## 2015-12-21 DIAGNOSIS — M5417 Radiculopathy, lumbosacral region: Secondary | ICD-10-CM

## 2015-12-21 DIAGNOSIS — I10 Essential (primary) hypertension: Secondary | ICD-10-CM

## 2015-12-21 DIAGNOSIS — E1143 Type 2 diabetes mellitus with diabetic autonomic (poly)neuropathy: Secondary | ICD-10-CM | POA: Diagnosis not present

## 2015-12-21 DIAGNOSIS — M5416 Radiculopathy, lumbar region: Secondary | ICD-10-CM

## 2015-12-21 DIAGNOSIS — E0821 Diabetes mellitus due to underlying condition with diabetic nephropathy: Secondary | ICD-10-CM | POA: Diagnosis not present

## 2015-12-21 DIAGNOSIS — G610 Guillain-Barre syndrome: Secondary | ICD-10-CM

## 2015-12-21 DIAGNOSIS — Z794 Long term (current) use of insulin: Secondary | ICD-10-CM

## 2015-12-21 LAB — POCT GLYCOSYLATED HEMOGLOBIN (HGB A1C): Hemoglobin A1C: 6.1

## 2015-12-21 MED ORDER — GABAPENTIN 600 MG PO TABS: 600.0000 mg | ORAL_TABLET | Freq: Two times a day (BID) | ORAL | 1 refills | Status: DC

## 2015-12-21 NOTE — Assessment & Plan Note (Signed)
Controlled -continue Amlodipine 5 mg and Metoprolol 75 mg BID

## 2015-12-21 NOTE — Assessment & Plan Note (Signed)
Controlled -continue Lantus, Victoza, and Humulog -patient to see eye physician for vision changes

## 2015-12-21 NOTE — Progress Notes (Signed)
   Subjective:    Patient ID: Tanner Mason, male    DOB: [birth year], 75 y.o.   MRN: [id number]  HPI  75 y/o male presents for routine follow up.   Guillen Barre - almost back to baseline, still unsteady on feet, completed PT, now driving, has follow up with Dr. Allena KatzPatel (Rehab) and Dr. Allena KatzPatel (Neuro).   Lumbar DDD - given Tramadol during Rehab, only takes them a few times per week, no new radicular symptoms, no bladder/bowel incontinence  Diabetes: taking Lantus 40 units daily, Victoza 1.8 mg daily, and Humulog 5 (8 with big meals); home fasting blood sugars running in the low 200's, patient does report one low blood sugar a week ago (91) - had not eaten lunch after a late breakfast; reports slightly decreased vision in left eye over past few months (due for eye exam in December).   Neuropathy - reports worsening pain in feet, would like to increase Gabapentin   HTN - taking Amlodipine 5 mg and Metoprolol 75 bid, no lightlheadedness, no chest pain.   Social - nonsmoker  Review of Systems  Constitutional: Negative for chills, fatigue and fever.  Respiratory: Negative for cough, choking, chest tightness and shortness of breath.   Cardiovascular: Negative for chest pain and leg swelling.       Objective:   Physical Exam BP 123/69   Pulse 78   Temp 97.9 F (36.6 C) (Oral)   Wt 161 lb 9.6 oz (73.3 kg)   BMI 25.31 kg/m  Gen: pleasant male, NAD HEENT: normocephalic, PERRL, EOMI, no scleral icterus Cardiac: RRR, S1 and S2 present, no murmur Resp: CTAB, normal effort Foot Exam: no ulceration, right foot had diminished sensation to monofilament MSK: no lumbar pain, negative slump test Neuro: CN 2-12 intact, strength 5/5 in all extremities, sensation to light touch intact in bilateral LE   POC A1C 6.1    Assessment & Plan:  Diabetes mellitus (HCC) Controlled -continue Lantus, Victoza, and Humulog -patient to see eye physician for vision changes  Peripheral autonomic  neuropathy due to diabetes mellitus (HCC) Uncontrolled -increase Gabapentin to 600 TID  Lumbar back pain with radiculopathy affecting left lower extremity No current radicular signs/symptoms. Taking prn Tramadol for pain. Exam unremarkable -added Tramadol to medication list (did not need a refill today).   Guillain Barr syndrome (HCC) Symptoms gradually improving, completed PT. -continue to follow up with Rehab and Neurology  Essential hypertension, benign Controlled -continue Amlodipine 5 mg and Metoprolol 75 mg BID

## 2015-12-21 NOTE — Assessment & Plan Note (Signed)
Uncontrolled -increase Gabapentin to 600 TID

## 2015-12-21 NOTE — Assessment & Plan Note (Signed)
No current radicular signs/symptoms. Taking prn Tramadol for pain. Exam unremarkable -added Tramadol to medication list (did not need a refill today).

## 2015-12-21 NOTE — Assessment & Plan Note (Signed)
Symptoms gradually improving, completed PT. -continue to follow up with Rehab and Neurology

## 2015-12-21 NOTE — Patient Instructions (Signed)
It was nice to see you today.  Blood Pressure and Diabetes well controlled. No changes in medications.  Foot pain/Neuropathy - increase Gapapentin to 600 mg three times per day  Tramadol - placed on medication list

## 2016-01-14 ENCOUNTER — Other Ambulatory Visit: Payer: Self-pay | Admitting: Family Medicine

## 2016-01-16 ENCOUNTER — Other Ambulatory Visit: Payer: Self-pay | Admitting: Family Medicine

## 2016-01-28 ENCOUNTER — Other Ambulatory Visit: Payer: Self-pay | Admitting: Family Medicine

## 2016-02-09 ENCOUNTER — Other Ambulatory Visit: Payer: Self-pay | Admitting: Family Medicine

## 2016-02-14 ENCOUNTER — Other Ambulatory Visit: Payer: Self-pay | Admitting: Family Medicine

## 2016-02-17 ENCOUNTER — Other Ambulatory Visit: Payer: Self-pay | Admitting: Family Medicine

## 2016-03-04 ENCOUNTER — Other Ambulatory Visit: Payer: Self-pay | Admitting: Family Medicine

## 2016-03-04 DIAGNOSIS — E0821 Diabetes mellitus due to underlying condition with diabetic nephropathy: Secondary | ICD-10-CM

## 2016-03-04 DIAGNOSIS — Z794 Long term (current) use of insulin: Secondary | ICD-10-CM

## 2016-03-27 NOTE — Progress Notes (Signed)
   Subjective:    Patient ID: Tanner Mason, male    DOB: February 20, 1941, 75 y.o.   MRN: [id number]  HPI 75 y/o male presents for follow up of diabetes.  Type 2 DM Currently on Lantus 40 units daily, Humalog 5 units with meals (8 units with large meals), and Victoza 1.8 mg daily. Only one episode of hypoglycemia associated with doing yard work. Fasting blood sugars are in the 200, does not check other times of the day, [facility]  Peripheral Neuropathy Increased Gabapentin to 600 mg TID at last visit (tolerating well), does reports "shooting pain" in bilateral feet, not related to exertions  Hypertension Prescribed Amlodipine 5 mg daily and Metoprolol 75 mg BID, at home 120's systolic. No lightheadedness  Angioedema Takes prn prednisone (needs every few months), no current symptoms  Back Pain Intermittent low back pain, occasionally shoots down left leg, history of DDD with left foot drop, no bladder or bowel incontience  HM Due for lipid profile and foot exam   Review of Systems  Constitutional: Negative for chills, fatigue and fever.  Respiratory: Negative for chest tightness and shortness of breath.        Objective:   Physical Exam BP (!) 155/83   Pulse 84   Temp 98.3 F (36.8 C) (Oral)   Ht 5\' 7"  (1.702 m)   Wt 167 lb 9.6 oz (76 kg)   BMI 26.25 kg/m  Gen: pleasant male, NAD HEENT: normocephalic, PERRL, EOMI, no scleral icterus, MMM, uvula midline Cardiac: RRR, S1 and S2 present, no murmur Resp: CTAB, normal effort MSK: lumbar - no paraspinal or midline tenderness Neuro: strength 5/5 in bilateral lower extremities, sensation to light touch intact Foot Exam: 2+ DP and PT pulses, sensation intact to monofilament testing, no skin ulcerations  POC A1C 6.2     Assessment & Plan:  Diabetes mellitus (HCC) Controlled. A1C 6.2 -continue Lantus, Humolog, and Victoza -foot exam completed today  Peripheral autonomic neuropathy due to  diabetes mellitus (HCC) Patient has worsening neuropathic symptoms. -check B12 and Folate -increase Gabapentin to 900 mg TID  Pure hypercholesterolemia Check Lipid Profile today  Essential hypertension, benign Mildly elevated today.  -patient to check at home and report readings via MyChart  Angioedema Intermittent Angioedema. Controlled with prn Prednisone -refill provided today  Lumbar back pain with radiculopathy affecting left lower extremity Patient reports intermittent left sided back pain with some radicular symptoms. Controlled with PRN Tramadol. No reg flag symptoms today. -refill of Tramadol provided

## 2016-03-28 ENCOUNTER — Ambulatory Visit (INDEPENDENT_AMBULATORY_CARE_PROVIDER_SITE_OTHER): Payer: Medicare Other | Admitting: Family Medicine

## 2016-03-28 ENCOUNTER — Encounter: Payer: Self-pay | Admitting: Family Medicine

## 2016-03-28 VITALS — BP 155/83 | HR 84 | Temp 98.3°F | Ht 67.0 in | Wt 167.6 lb

## 2016-03-28 DIAGNOSIS — Z794 Long term (current) use of insulin: Secondary | ICD-10-CM | POA: Diagnosis not present

## 2016-03-28 DIAGNOSIS — E0821 Diabetes mellitus due to underlying condition with diabetic nephropathy: Secondary | ICD-10-CM

## 2016-03-28 DIAGNOSIS — G609 Hereditary and idiopathic neuropathy, unspecified: Secondary | ICD-10-CM

## 2016-03-28 DIAGNOSIS — I10 Essential (primary) hypertension: Secondary | ICD-10-CM

## 2016-03-28 DIAGNOSIS — E1143 Type 2 diabetes mellitus with diabetic autonomic (poly)neuropathy: Secondary | ICD-10-CM

## 2016-03-28 DIAGNOSIS — M5417 Radiculopathy, lumbosacral region: Secondary | ICD-10-CM

## 2016-03-28 DIAGNOSIS — T783XXD Angioneurotic edema, subsequent encounter: Secondary | ICD-10-CM

## 2016-03-28 DIAGNOSIS — E1140 Type 2 diabetes mellitus with diabetic neuropathy, unspecified: Secondary | ICD-10-CM

## 2016-03-28 DIAGNOSIS — G6289 Other specified polyneuropathies: Secondary | ICD-10-CM

## 2016-03-28 DIAGNOSIS — E7800 Pure hypercholesterolemia, unspecified: Secondary | ICD-10-CM

## 2016-03-28 DIAGNOSIS — M5416 Radiculopathy, lumbar region: Secondary | ICD-10-CM

## 2016-03-28 LAB — LIPID PANEL
Cholesterol: 95 mg/dL (ref <200)
HDL: 27 mg/dL — ABNORMAL LOW (ref >40)
LDL Cholesterol: 12 mg/dL (ref <100)
Total CHOL/HDL Ratio: 3.5 Ratio (ref <5.0)
Triglycerides: 281 mg/dL — ABNORMAL HIGH (ref <150)
VLDL: 56 mg/dL — ABNORMAL HIGH (ref <30)

## 2016-03-28 LAB — BASIC METABOLIC PANEL WITH GFR
BUN: 20 mg/dL (ref 7–25)
CO2: 25 mmol/L (ref 20–31)
Calcium: 9.2 mg/dL (ref 8.6–10.3)
Chloride: 106 mmol/L (ref 98–110)
Creat: 1.86 mg/dL — ABNORMAL HIGH (ref 0.70–1.18)
GFR, Est African American: 41 mL/min — ABNORMAL LOW (ref >=60)
GFR, Est Non African American: 35 mL/min — ABNORMAL LOW (ref >=60)
Glucose, Bld: 200 mg/dL — ABNORMAL HIGH (ref 65–99)
Potassium: 3.7 mmol/L (ref 3.5–5.3)
Sodium: 140 mmol/L (ref 135–146)

## 2016-03-28 LAB — POCT GLYCOSYLATED HEMOGLOBIN (HGB A1C): Hemoglobin A1C: 6.2

## 2016-03-28 MED ORDER — TRAMADOL HCL 50 MG PO TABS: 50.0000 mg | ORAL_TABLET | Freq: Four times a day (QID) | ORAL | 0 refills | Status: DC | PRN

## 2016-03-28 MED ORDER — PREDNISONE 10 MG PO TABS: ORAL_TABLET | ORAL | 1 refills | Status: DC

## 2016-03-28 MED ORDER — GABAPENTIN 300 MG PO CAPS: 900.0000 mg | ORAL_CAPSULE | Freq: Three times a day (TID) | ORAL | 3 refills | Status: DC

## 2016-03-28 NOTE — Assessment & Plan Note (Signed)
Patient has worsening neuropathic symptoms. -check B12 and Folate -increase Gabapentin to 900 mg TID

## 2016-03-28 NOTE — Assessment & Plan Note (Signed)
Patient reports intermittent left sided back pain with some radicular symptoms. Controlled with PRN Tramadol. No reg flag symptoms today. -refill of Tramadol provided

## 2016-03-28 NOTE — Assessment & Plan Note (Signed)
Check Lipid Profile today

## 2016-03-28 NOTE — Assessment & Plan Note (Signed)
Mildly elevated today.  -patient to check at home and report readings via MyChart

## 2016-03-28 NOTE — Patient Instructions (Signed)
It was nice to see you today.  Neuropathy - increase Gabapentin to 900 mg daily, check vitamin levels  Diabetes - well controlled, check blood sugars throughout the day and send to Dr. Randolm IdolFletke in MyChart  Blood pressure - elevated today, send readings to Dr. Randolm IdolFletke  Check lipid profile (cholesterol) and kidney levels.

## 2016-03-28 NOTE — Assessment & Plan Note (Signed)
Intermittent Angioedema. Controlled with prn Prednisone -refill provided today

## 2016-03-28 NOTE — Assessment & Plan Note (Signed)
Controlled. A1C 6.2 -continue Lantus, Humolog, and Victoza -foot exam completed today

## 2016-03-29 LAB — VITAMIN B12: Vitamin B-12: 374 pg/mL (ref 200–1100)

## 2016-03-29 LAB — FOLATE: Folate: >24 ng/mL (ref >5.4)

## 2016-04-01 ENCOUNTER — Telehealth: Payer: Self-pay | Admitting: Family Medicine

## 2016-04-01 NOTE — Telephone Encounter (Signed)
Discussed lab results with patient. B12 and folate in normal range. Triglycerides elevated (however patient had eaten prior to labs) - will recheck in 6-12 months. Creatinine level up to 1.8 (still around baseline) - continue to follow with nephrology.

## 2016-04-05 LAB — HM DIABETES EYE EXAM

## 2016-04-11 ENCOUNTER — Encounter: Payer: Medicare Other | Attending: Physical Medicine & Rehabilitation | Admitting: Physical Medicine & Rehabilitation

## 2016-04-11 ENCOUNTER — Encounter: Payer: Self-pay | Admitting: Physical Medicine & Rehabilitation

## 2016-04-11 VITALS — BP 103/67 | HR 83 | Resp 14

## 2016-04-11 DIAGNOSIS — E1142 Type 2 diabetes mellitus with diabetic polyneuropathy: Secondary | ICD-10-CM | POA: Diagnosis not present

## 2016-04-11 DIAGNOSIS — G610 Guillain-Barre syndrome: Secondary | ICD-10-CM | POA: Insufficient documentation

## 2016-04-11 DIAGNOSIS — D696 Thrombocytopenia, unspecified: Secondary | ICD-10-CM | POA: Diagnosis not present

## 2016-04-11 DIAGNOSIS — Z87891 Personal history of nicotine dependence: Secondary | ICD-10-CM | POA: Insufficient documentation

## 2016-04-11 DIAGNOSIS — R269 Unspecified abnormalities of gait and mobility: Secondary | ICD-10-CM | POA: Diagnosis not present

## 2016-04-11 LAB — CBC WITH DIFFERENTIAL/PLATELET
Basophils Absolute: 0 10*3/uL (ref 0.0–0.2)
Basos: 0 %
EOS (ABSOLUTE): 0.2 10*3/uL (ref 0.0–0.4)
Eos: 4 %
Hematocrit: 35.9 % — ABNORMAL LOW (ref 37.5–51.0)
Hemoglobin: 12.5 g/dL — ABNORMAL LOW (ref 13.0–17.7)
Lymphocytes Absolute: 2.1 10*3/uL (ref 0.7–3.1)
Lymphs: 38 %
MCH: 31.6 pg (ref 26.6–33.0)
MCHC: 34.8 g/dL (ref 31.5–35.7)
MCV: 91 fL (ref 79–97)
Monocytes Absolute: 0.5 10*3/uL (ref 0.1–0.9)
Monocytes: 8 %
Neutrophils Absolute: 2.8 10*3/uL (ref 1.4–7.0)
Neutrophils: 50 %
Platelets: 123 10*3/uL — ABNORMAL LOW (ref 150–379)
RBC: 3.95 x10E6/uL — ABNORMAL LOW (ref 4.14–5.80)
RDW: 15.9 % — ABNORMAL HIGH (ref 12.3–15.4)
WBC: 5.6 10*3/uL (ref 3.4–10.8)

## 2016-04-11 NOTE — Progress Notes (Signed)
Subjective:    Patient ID: Tanner Mason, male    DOB: [birth year], 75 y.o.   MRN: [id number]  HPI  75 y.o. male with history of DMT2 with neuropathy presents for follow up for GBS.    Last clinic visit 10/19/15. Since that times, his orthostasis has resolved. He saw Neurology and is scheduled to follow up again in 04/2016.  He completed outpatient therapy as well. He still has neuropathy.  His Gabapentin was increased 900 TID. Denies falls.   Pain Inventory Average Pain 5 Pain Right Now 2 My pain is constant and aching  In the last 24 hours, has pain interfered with the following? General activity 4 Relation with others 4 Enjoyment of life 4 What TIME of day is your pain at its worst? daytime Sleep (in general) Good  Pain is worse with: bending and sitting Pain improves with: rest and medication Relief from Meds: 7  Mobility walk with assistance use a walker ability to climb steps?  yes do you drive?  no  Function retired  Neuro/Psych numbness tingling trouble walking  Prior Studies CT/MRI  Physicians involved in your care Any changes since last visit?  yes   Family History  Problem Relation Age of Onset  . Alzheimer's disease Mother     died age 73  . Aortic aneurysm Father     died age 282  . Anuerysm Father   . Aortic aneurysm Brother     repaired plus AI graft  . Diabetes Brother    Social History   Social History  . Marital status: Married    Spouse name: Junious DresserConnie  . Number of children: 2  . Years of education: 4 college   Occupational History  . teacher     retired   . MEDIA SPECIALIST Retired   Social History Main Topics  . Smoking status: Former Smoker    Packs/day: 1.00    Years: 10.00    Types: Cigarettes    Start date: 05/25/42    Quit date: 06/10/1967  . Smokeless tobacco: Never Used  . Alcohol use 1.0 oz/week    2 Standard drinks or equivalent per week     Comment: occasional  . Drug use: No  . Sexual activity: Yes   Partners: Female   Other Topics Concern  . None   Social History Narrative   Daughter, Nehemiah [place]oke [place], has 2 children   Daugher, Marcelino DusterMichelle, in [place], 2 children      He retired from teaching in 2010      Health Care POA:    Emergency Contact: wife Junious DresserConnie   End of Life Plan:    Who lives with you: wife   Any pets: none   Diet: Patient has a varied diet of vegetables, protein, and starch.  Pt reports enjoying his sweets and carbohydrates.   Exercise: [facility].  Cardio, strength    Seatbelts: Pt reports wearing seatbelt when in vehicle.    Sun Exposure/Protection: Pt reports wearing sun screen/hat and is under care of dermatologist.   Hobbies: boating, vacationing   One story home.             Past Surgical History:  Procedure Laterality Date  . ANTERIOR CRUCIATE LIGAMENT REPAIR  5/98   Left, staph infection complicated  . BASAL CELL CARCINOMA EXCISION  02/2005   R ear  . BLEPHAROPLASTY  03/2005   with ptosis repair  . INGUINAL HERNIA REPAIR Right  [id number]  . INGUINAL HERNIA REPAIR Left 1970   Past Medical History:  Diagnosis Date  . Abdominal ultrasound, abnormal 02/2004   fatty liver  . Cellulitis and abscess 07/14/2015  . Diabetes mellitus without complication (HCC)   . Elbow dislocation [id number]   Right  . Encounter for diagnostic endoscopy 10/22/04   negative  . History of esophagogastroduodenoscopy 08/13/2007   normal  . Treadmill stress test negative for angina pectoris 06/2002   poor HR and BP recovery   BP 103/67   Pulse 83   Resp 14   SpO2 96%   Opioid Risk Score:   Fall Risk Score:  `1  Depression screen PHQ 2/9  [facility] 03/28/2016 12/21/2015 10/26/2015 10/19/2015 07/14/2015 02/23/2015 11/08/2014  Decreased Interest 0 0 0 0 0 0 0  Down, Depressed, Hopeless 0 0 0 0 0 0 0  PHQ - 2 Score 0 0 0 0 0 0 0  Altered sleeping 0 - - 1 - - -  Tired, decreased energy 0 - - 1 - - -  Change in appetite 0 - - 0 - - -    Feeling bad or failure about yourself  0 - - 0 - - -  Trouble concentrating 0 - - 0 - - -  Moving slowly or fidgety/restless 0 - - 0 - - -  Suicidal thoughts 0 - - 0 - - -  PHQ-9 Score 0 - - 2 - - -  Difficult doing work/chores - - - Not difficult at all - - -  Some recent data might be hidden   Review of Systems  Musculoskeletal: Positive for gait problem.  Neurological: Positive for numbness.       Tingling   All other systems reviewed and are negative.     Objective:   Physical Exam Constitutional: He appears well-developed and well-nourished. NAD.   HENT:  Normocephalic and atraumatic.    Eyes: EOMI. No discharge.  Cardiovascular: RRR. No JVD.Marland Kitchen.    Respiratory: Effort normal and breath sounds normal.  GI: Soft. Bowel sounds are normal.   Musculoskeletal: He exhibits no edema or tenderness.  Gait: Slight stepage gait b/l.   Neurological: He is alert and oriented.  Mild ptosis on left -chronic.   Motor: 5/5 B/l UE proximal to distal RLE: 5/5 proximally, 5/5 ankle dorsiflexion LLE: 5/5 proximally, 5/5 ankle dorsiflexion Skin: Skin is warm and dry. He is not diaphoretic.  Psychiatric: He has a normal mood and affect. His behavior is normal.     Assessment & Plan:  75 y.o. male with history of DMT2 with neuropathy presents for follow up for GBS.    1. Guillain Barre Syndrome  Cont HEP  Cont follow up with Neurology  Encouraged pool therapy  2. Peripheral Neuropathy due to DM  Cont current dose of Gabapentin, recently adjusted by PCP  3. Thrombocytopenia  Labs reviewed, platelets remain low in 09/2015  Will order repeat labs as none have been drawn  4. Neurologic gait  Multifactorial DM +/- GBS  Pt would like to consider EMG/NCS for more information  Cont excercises

## 2016-04-19 ENCOUNTER — Ambulatory Visit: Payer: Medicare Other | Admitting: Physical Medicine & Rehabilitation

## 2016-04-29 HISTORY — PX: OTHER SURGICAL HISTORY: SHX169

## 2016-05-09 ENCOUNTER — Other Ambulatory Visit: Payer: Self-pay | Admitting: Family Medicine

## 2016-05-20 ENCOUNTER — Other Ambulatory Visit: Payer: Self-pay | Admitting: Family Medicine

## 2016-05-23 ENCOUNTER — Encounter: Payer: Self-pay | Admitting: Family Medicine

## 2016-05-23 ENCOUNTER — Ambulatory Visit (INDEPENDENT_AMBULATORY_CARE_PROVIDER_SITE_OTHER): Payer: Medicare Other | Admitting: Family Medicine

## 2016-05-23 VITALS — BP 118/64 | HR 90 | Temp 98.1°F

## 2016-05-23 DIAGNOSIS — B349 Viral infection, unspecified: Secondary | ICD-10-CM | POA: Diagnosis not present

## 2016-05-23 DIAGNOSIS — R410 Disorientation, unspecified: Secondary | ICD-10-CM | POA: Diagnosis not present

## 2016-05-23 LAB — GLUCOSE, POCT (MANUAL RESULT ENTRY): POC Glucose: 203 mg/dl — AB (ref 70–99)

## 2016-05-23 MED ORDER — OSELTAMIVIR PHOSPHATE 75 MG PO CAPS: 75.0000 mg | ORAL_CAPSULE | Freq: Every day | ORAL | 0 refills | Status: DC

## 2016-05-23 NOTE — Assessment & Plan Note (Signed)
Clinically suspect Influenza. -prescribed Tamiflu -return precautions discussed

## 2016-05-23 NOTE — Patient Instructions (Addendum)

## 2016-05-23 NOTE — Progress Notes (Signed)
   Subjective:    Patient ID: Tanner Mason, male    DOB: [birth year], 76 y.o.   MRN: [id number]  HPI 76 y/o male presents for same day appointment.   1 day history of nausea, non-bloody emesis, and diarrhea, no associated cough, subjective fevers and chills, wife noted that he was "disoriented" yesterday and unstable on feet, He does have a history of Guillain Barre syndrome. Symptoms improved this AM, tolerating water and cheerios, no further nausea/emesis, no cough, no congestion. Blood sugar this AM 170. Has not taken any otc medications.    Review of Systems See abvoe    Objective:   Physical Exam BP 118/64   Pulse 90   Temp 98.1 F (36.7 C)   SpO2 99%  Gen: pleasant male, NAD HEENT: normocephalic, PERRL, EOMI, no scleral icterus, dry mucous membranes, neck supple, no adenopathy Cardiac: RRR, S1 and S2 present, no murmur Resp: CTAB,normal effort Abd: soft, no tenderness, normal bowel sounds Neuro: CN 2-12 intact, strength 5/5 in all extremities, pronator drift and rhomberg unremarkable.    POC CBG: 203     Assessment & Plan:  Viral illness Clinically suspect Influenza. -prescribed Tamiflu -return precautions discussed

## 2016-05-24 ENCOUNTER — Other Ambulatory Visit: Payer: Self-pay | Admitting: Family Medicine

## 2016-05-27 ENCOUNTER — Ambulatory Visit (INDEPENDENT_AMBULATORY_CARE_PROVIDER_SITE_OTHER): Payer: Medicare Other | Admitting: Neurology

## 2016-05-27 ENCOUNTER — Encounter: Payer: Self-pay | Admitting: Neurology

## 2016-05-27 VITALS — BP 108/70 | HR 81 | Ht 67.0 in | Wt 163.5 lb

## 2016-05-27 DIAGNOSIS — M21372 Foot drop, left foot: Secondary | ICD-10-CM

## 2016-05-27 DIAGNOSIS — G610 Guillain-Barre syndrome: Secondary | ICD-10-CM

## 2016-05-27 DIAGNOSIS — E0842 Diabetes mellitus due to underlying condition with diabetic polyneuropathy: Secondary | ICD-10-CM | POA: Diagnosis not present

## 2016-05-27 NOTE — Progress Notes (Signed)
Follow-up Visit   Date: 05/27/16    Tanner Mason SEES MRN: [id number] DOB: 1940/06/13   Interim History: Tanner Mason is a 76 y.o. right-handed Caucasian male with insulin-dependent diabetes mellitus c/b renal insufficiency, hypertension, basal cell carcinoma of the ear s/p resection, and hyperlipidemia returning to the clinic for follow-up of Guilliane-Barer syndrome.  The patient was accompanied to the clinic by wife who also provides collateral information.    History of present illness: He was visiting [place] at [facility] and he suddenly developed worsening tingling of the feet which began involving his lower leg.  He was evaluated at the ER in [place] where MRI brain and lumbar spine was performed.  The following day he came to [facility], he was unable to walk which prompted him to go to the [facility] ED.  By the time he arrived at the ED, his tingling and numbness involved the level of thigh.  Patient was hospitalized at [facility] from 5/21 - 09/26/2015 with progressive numbness of the feet, imbalance, and weakness.  His MRI lumbar spine showed mild-moderate stenosis at L4-5 and L5-S1.  Due to clinical exam showing arreflexia, there was a high suspicion for GBS, and his CSF testing was consistent with this showing albuminocytologic dissociation and elevated IgG index.  Of note, his CSF glucose was also elevated, but serum glucose on the same day was 280-363.  He completed 5 sessions of plasmapheresis. After the 3rd session, he began noticing improved paresthesias. He developed dysautonomia manifesting with orthostasis for which his blood pressure medications were adjusted.  He was discharged to rehab facility and went home on 6/12 with a walker and out-patient PT.   He has been doing out-patient physical therapy and noticed significant improvement and able to walk unassisted.  His balance is still a problem, and he has not had any falls.  His strength is improving  and now his paresthesias are isolated to his feet, which is where it used to be given his history of neuropathy.  He also has left foot weakness which has been present for at least the past 4 years.  He was told that he may need back surgery for this some time back, but decided to pursue it conservatively. Overall, he feels that is is very close to his baseline.    UPDATE 05/27/2016:  Over the past several months, he feels that his imbalance has been worsening and he has tightness/numbness to the level of his ankles, where as previous to his GBS, his neuropathy was restricted to his feet.  He denies any painful paresthesias, which has been well controlled on gabapentin 925m TID. He continues to walk unassisted and does not use a ankle brace, but has noticed that walking takes more conscientious effort.  He has not suffered any falls, but wife states he does stumble.  He has not noticed any new weakness.  His left foot continues to be weak (old).  Medications:  Current Outpatient Prescriptions on File Prior to Visit  Medication Sig Dispense Refill  . amLODipine (NORVASC) 5 MG tablet TAKE 1 TABLET (5 MG TOTAL) BY MOUTH DAILY. 30 tablet 0  . aspirin (BAY[employer]IL[employer]ENS ASPIRIN) 81 MG chewable tablet Chew 81 mg by mouth daily.      .Marland Kitchenatorvastatin (LIPITOR) 40 MG tablet TAKE ONE TABLET BY MOUTH ONE TIME DAILY 90 tablet 1  . BD PEN NEEDLE NANO U/F 32G X 4 MM MISC USE AS INSTRUCTED BY YOUR PHYSICIAN TO CHECK BLOOD  SUGAR 100 each 2  . cetirizine (ZYRTEC) 10 MG tablet Take 10 mg by mouth every evening.    . diphenhydrAMINE (BENADRYL) 12.5 MG/5ML elixir Take 25 mg by mouth 4 (four) times daily as needed for allergies.    Marland Kitchen EPIPEN 2-PAK 0.3 MG/0.3ML SOAJ injection INJECT 0.3MLS INTO THE MUSLE ONCE 2 Device 1  . FERREX 150 150 MG capsule TAKE 1 CAPSULE (150 MG TOTAL) BY MOUTH 2 (TWO) TIMES DAILY BEFORE LUNCH AND SUPPER. 60 capsule 0  . finasteride (PROSCAR) 5 MG tablet TAKE 1 TABLET BY MOUTH DAILY. 90 tablet 1  .  gabapentin (NEURONTIN) 300 MG capsule Take 3 capsules (900 mg total) by mouth 3 (three) times daily. 270 capsule 3  . insulin lispro (HUMALOG KWIKPEN) 100 UNIT/ML KiwkPen Inject 0.05 mLs (5 Units total) into the skin 3 (three) times daily. 5 pen 2  . LANTUS SOLOSTAR 100 UNIT/ML Solostar Pen INJECT 40 UNITS INTO THE SKIN DAILY AT 10 PM. 15 pen 2  . Metoprolol Tartrate 75 MG TABS TAKE 75 MG BY MOUTH 2 (TWO) TIMES DAILY. 60 tablet 3  . omeprazole (PRILOSEC) 20 MG capsule TAKE ONE CAPSULE BY MOUTH ONE TIME DAILY 90 capsule 1  . ONE TOUCH ULTRA TEST test strip USE TO CHECK BLOOD SUGAR THREE TIMES DAILY 100 each 2  . oseltamivir (TAMIFLU) 75 MG capsule Take 1 capsule (75 mg total) by mouth daily. 5 capsule 0  . potassium chloride (K-DUR,KLOR-CON) 10 MEQ tablet TAKE TWO TABLETS BY MOUTH DAILY 180 tablet 1  . predniSONE (DELTASONE) 10 MG tablet Take one tablet as needed for tongue swelling. 10 tablet 1  . senna-docusate (SENOKOT-S) 8.6-50 MG tablet Take 2 tablets by mouth 2 (two) times daily. 100 tablet 0  . simethicone (MYLICON) 80 MG chewable tablet Chew 1 tablet (80 mg total) by mouth 3 (three) times daily before meals. 120 tablet 0  . tamsulosin (FLOMAX) 0.4 MG CAPS capsule TAKE ONE CAPSULE BY MOUTH ONE TIME DAILY 90 capsule 1  . traMADol (ULTRAM) 50 MG tablet Take 1 tablet (50 mg total) by mouth every 6 (six) hours as needed. 30 tablet 0  . VICTOZA 18 MG/3ML SOPN INJECT 1.8MG INTO THE SKIN DAILY 3 pen 2   No current facility-administered medications on file prior to visit.     Allergies:  Allergies  Allergen Reactions  . Ace Inhibitors Other (See Comments)    REACTION: Angioedema  . Calcium Carbonate Antacid Other (See Comments)    REACTION: Angioedema of mouth    Review of Systems:  CONSTITUTIONAL: No fevers, chills, night sweats, or weight loss.  EYES: No visual changes or eye pain ENT: No hearing changes.  No history of nose bleeds.   RESPIRATORY: No cough, wheezing and shortness of  breath.   CARDIOVASCULAR: Negative for chest pain, and palpitations.   GI: Negative for abdominal discomfort, blood in stools or black stools.  No recent change in bowel habits.   GU:  No history of incontinence.   MUSCLOSKELETAL: No history of joint pain or swelling.  No myalgias.   SKIN: Negative for lesions, rash, and itching.   ENDOCRINE: Negative for cold or heat intolerance, polydipsia or goiter.   PSYCH:  No depression or anxiety symptoms.   NEURO: As Above.   Vital Signs:  BP 108/70   Pulse 81   Ht _0  (1.702 m)   Wt 163 lb 8 oz (74.2 kg)   SpO2 98%   BMI 25.61 kg/m   Neurological Exam:  MENTAL STATUS including orientation to time, place, person, recent and remote memory, attention span and concentration, language, and fund of knowledge is normal.  Speech is not dysarthric.  CRANIAL NERVES:  Pupils equal round and reactive to light.  Normal conjugate, extra-ocular eye movements in all directions of gaze.  Left ptosis (old). Normal facial sensation.  Face is symmetric. Palate elevates symmetrically.  Tongue is midline.  MOTOR:  Motor strength is 5/5 in all extremities, except L dorsiflexion, toe extension and eversion is 4/5.  Bilateral toe flexion is 5-/5, right toe extension is 5-/5. No atrophy, fasciculations or abnormal movements.  No pronator drift.  Tone is normal.    MSRs:  Right                                                                 Left brachioradialis 2+  brachioradialis 2+  biceps 2+  biceps 2+  triceps 1+  triceps 1+  patellar 0  Patellar 1+  ankle jerk 0  ankle jerk 0    SENSORY:  Vibration absent at the ankles bilaterally, intact at the knees.  Negative Rhomberg.   COORDINATION/GAIT:  Normal finger-to- nose-finger.  Intact rapid alternating movements bilaterally.  There is mild dragging of the left foot and poor arm swing on the right, but overall gait is stable, unassisted.  He can toe walk, but unable to heel walk on the left.  Data: MRI  lumbar spine wo contradst 09/19/2015: 1. Overall stable appearance of the lumbar spine as compared to previous MRI from 04/13/2014. No significant canal stenosis. No evidence for cord compression. 2. Similar effacement of the fat around the descending left L5 nerve root in the left L4-5 lateral recess, again suspicious for a small sequestered disc fragment. This could potentially affect the transiting left L5 nerve root. This is similar to prior. 3. Mild to moderate bilateral foraminal stenosis at L4-5 and L5-S1, stable.  CSF 09/18/2015:  R1  W1  G148*  P135*, IgG index 8.7, CSF cultures negative Las 09/18/2015:  CRP 0.6, vitamin B12 471, ANA neg, HIV neg, RPR, neg, TSH 1.32, HbA1c 7.4*, ESR 25  IMPRESSION/PLAN: 1.  Guillain-Barre Syndrome (May 2017), treated with plasmapheresis and recovered well.  He has distal paresthesias which seems to be his baseline diabetic neuropathy and left foot drop which is also old and likely due to L5 radiculopathy.  Over the past several months, he feels that his numbness has progressed past the soles of his feet and now comes above the level of the ankles bilaterally.  Exam is relatively stable and there is return of his left patella jerk which is new.  Motor strength is stable and continues to show left foot drop and distal weakness. To better determine whether his paresthesias are due to natural progression of neuropathy or transition into chronic inflammatory polyradiculoneuropathy, he will have NCS/EMG of the legs and one arm.  He will also be referred back to out-patient PT for balance training. I recommend that he use a cane for long distances.    2.  Diabetic polyneuropathy affecting the feet.  Continue gabapentin 950m TID, may consider tapering this going forward.  3.  Left foot drop due to L5 radiculopathy, stable.  Ankle foot orthotic was offered, but he would like  to hold off on this for now.   Return to clinic in 4 months   The duration of this  appointment visit was 30 minutes of face-to-face time with the patient.  Greater than 50% of this time was spent in counseling, explanation of diagnosis, planning of further management, and coordination of care.   Thank you for allowing me to participate in patient's care.  If I can answer any additional questions, I would be pleased to do so.    Sincerely,    Khi Mcmillen K. Posey Pronto, DO

## 2016-05-27 NOTE — Patient Instructions (Addendum)
1.  NCS/EMG of the legs 2.  Start physical therapy for gait training 3.  Please call my office if you would like to be referred for an ankle brace  Return to clinic in 4 months

## 2016-05-30 ENCOUNTER — Ambulatory Visit: Payer: Medicare Other | Admitting: Family Medicine

## 2016-05-30 ENCOUNTER — Ambulatory Visit (INDEPENDENT_AMBULATORY_CARE_PROVIDER_SITE_OTHER): Payer: Medicare Other | Admitting: Neurology

## 2016-05-30 DIAGNOSIS — E0842 Diabetes mellitus due to underlying condition with diabetic polyneuropathy: Secondary | ICD-10-CM | POA: Diagnosis not present

## 2016-05-30 NOTE — Procedures (Signed)
[facility]  [address], [address]  [place], [place] [address] Tel: [phone] Fax:  [phone] Test Date:  05/30/2016  Patient: Tanner Mason DOB: [birth year] Physician: Nita Sickle, DO  Sex: Male Height: 5\' 7"  Ref Phys: Nita Sickle, DO  ID#: [id number] Temp: 33.2C Technician: Judie Petit. Dean   Patient Complaints: This 76 year old diabetic gentleman with history of Guillain-Barr syndrome referred for evaluation of worsening bilateral leg paresthesias.  NCV & EMG Findings: Extensive electrodiagnostic testing of the left upper and lower extremity and additional studies of the right lower extremity shows:  1. Left median and ulnar sensory responses are within normal limits. 2. Left median motor responses within normal limits. Left ulnar motor response shows reduced amplitude (5.2 mV) and mildly slowed conduction velocity across the elbow (49 m/s). 3. Bilateral sural and superficial peroneal sensory responses are absent. 4. Left peroneal motor response is absent at the extensor digitorum brevis. Right peroneal motor response at the extensor digitorum brevis shows markedly reduced amplitude and slowed conduction velocity. Bilateral peroneal motor responses recording at the tibialis anterior is within normal limits. Bilateral tibial motor responses show reduced amplitude and slowed conduction velocity. 5. Right median F-wave is within normal limits. Bilateral tibial H reflex studies are prolonged. 6. In the left upper extremity, chronic motor axon loss changes are seen affecting the ulnar innervated muscles, without accompanied active denervation. 7. In bilateral lower extremities, chronic motor axon loss changes are seen affecting muscles below the knee and worse on the left where there is also changes in the gluteus medius muscle. There is no evidence of active denervation in any of the tested muscles including the lumbar paraspinal muscles.  Impression: 1. Chronic sensorimotor  polyneuropathy, predominantly axon loss in type, affecting the lower extremities; severe in degree electrically.  2. There is evidence of a superimposed chronic L5 radiculopathy on the left. 3. Left ulnar neuropathy with slowing across the elbow, demyelinating and axon loss in type.   ___________________________ Nita Sickle, DO    Nerve Conduction Studies Anti Sensory Summary Table   Site NR Peak (ms) Norm Peak (ms) P-T Amp (V) Norm P-T Amp  Left Median Anti Sensory (2nd Digit)  33.2C  Wrist    3.7 <3.8 12.6 >10  Left Sup Peroneal Anti Sensory (Ant Lat Mall)  33.2C  12 cm NR  <4.6  >3  Right Sup Peroneal Anti Sensory (Ant Lat Mall)  12 cm NR  <4.6  >3  Left Sural Anti Sensory (Lat Mall)  33.2C  Calf NR  <4.6  >3  Right Sural Anti Sensory (Lat Mall)  Calf NR  <4.6  >3  Left Ulnar Anti Sensory (5th Digit)  33.2C  Wrist    3.1 <3.2 11.7 >5   Motor Summary Table   Site NR Onset (ms) Norm Onset (ms) O-P Amp (mV) Norm O-P Amp Site1 Site2 Delta-0 (ms) Dist (cm) Vel (m/s) Norm Vel (m/s)  Left Median Motor (Abd Poll Brev)  33.2C  Wrist    3.9 <4.0 5.8 >5 Elbow Wrist 4.5 23.0 51 >50  Elbow    8.4  5.5         Left Peroneal Motor (Ext Dig Brev)  33.2C  Ankle NR  <6.0  >2.5 B Fib Ankle  0.0  >40  B Fib NR     Poplt B Fib  0.0  >40  Poplt NR            Right Peroneal Motor (Ext Dig Brev)  Ankle  4.6 <6.0 0.5 >2.5 B Fib Ankle 14.9 32.0 21 >40  B Fib    19.5  0.3  Poplt B Fib 2.7 10.0 37 >40  Poplt    22.2  0.3         Left Peroneal TA Motor (Tib Ant)  33.2C  Fib Head    4.3 <4.5 3.1 >3 Poplit Fib Head 2.0 10.0 50 >40  Poplit    6.3  2.6         Right Peroneal TA Motor (Tib Ant)  33.2C  Fib Head    3.9 <4.5 3.5 >3 Poplit Fib Head 1.7 10.0 59 >40  Poplit    5.6  3.3         Left Tibial Motor (Abd Hall Brev)  33.2C  Ankle    5.0 <6.0 0.9 >4 Knee Ankle 12.7 38.0 30 >40  Knee    17.7  0.6         Right Tibial Motor (Abd Hall Brev)  33.2C  Ankle    4.6 <6.0 1.0 >4 Knee  Ankle 14.4 38.0 26 >40  Knee    19.0  0.8         Left Ulnar Motor (Abd Dig Minimi)  33.2C  Wrist    2.9 <3.1 5.2 >7 B Elbow Wrist 4.3 21.0 49 >50  B Elbow    7.2  5.0  A Elbow B Elbow 2.0 10.0 50 >50  A Elbow    9.2  4.7          F Wave Studies   NR F-Lat (ms) Lat Norm (ms) L-R F-Lat (ms) M-Lat (ms) FLat-MLat (ms)  Right Median (Mrkrs) (Abd Poll Brev)  33.2C     31.70 <33  3.60 28.10   H Reflex Studies   NR H-Lat (ms) Lat Norm (ms) L-R H-Lat (ms) M-Lat (ms) HLat-MLat (ms)  Left Tibial (Gastroc)  33.2C     41.22 <35 4.22 6.53 34.69  Right Tibial (Gastroc)     45.44 <35 4.22 6.53 38.91   EMG   Side Muscle Ins Act Fibs Psw Fasc Number Recrt Dur Dur. Amp Amp. Poly Poly. Comment  Left AntTibialis Nml Nml Nml Nml SMU Rapid Many 1+ Many 1+ Many 1+ N/A  Left RectFemoris Nml Nml Nml Nml Nml Nml Nml Nml Nml Nml Nml Nml N/A  Left GluteusMed Nml Nml Nml Nml 1- Rapid Some 1+ Some 1+ Nml Nml N/A  Left Lumbo Parasp Low Nml Nml Nml Nml NE - - - - - - Nml N/A  Left BicepsFemS Nml Nml Nml Nml Nml Nml Nml Nml Nml Nml Nml Nml N/A  Left Triceps Nml Nml Nml Nml Nml Nml Nml Nml Nml Nml Nml Nml N/A  Left 1stDorInt Nml Nml Nml Nml 1- Rapid Some 1+ Some 1+ Nml Nml N/A  Left Ext Indicis Nml Nml Nml Nml Nml Nml Nml Nml Nml Nml Nml Nml N/A  Left PronatorTeres Nml Nml Nml Nml Nml Nml Nml Nml Nml Nml Nml Nml N/A  Left Biceps Nml Nml Nml Nml Nml Nml Nml Nml Nml Nml Nml Nml N/A  Left Flex Dig Long Nml Nml Nml Nml 2- Rapid Many 1+ Many 1+ Nml Nml N/A  Left Deltoid Nml Nml Nml Nml Nml Nml Nml Nml Nml Nml Nml Nml N/A  Left ABD Dig Min Nml Nml Nml Nml 1- Rapid Some 1+ Some 1+ Nml Nml N/A  Left FlexCarpiUln Nml Nml Nml Nml 1- Mod-R Few 1+ Few 1+ Nml Nml  N/A  Left Gastroc Nml Nml Nml Nml 1- Rapid Some 1+ Some 1+ Nml Nml N/A  Right BicepsFemS Nml Nml Nml Nml Nml Nml Nml Nml Nml Nml Nml Nml N/A  Right AntTibialis Nml Nml Nml Nml 1- Rapid Some 1+ Some 1+ Nml Nml N/A  Right Gastroc Nml Nml Nml Nml 1- Mod-R Few 1+  Few 1+ Nml Nml N/A  Right Flex Dig Long Nml Nml Nml Nml 1- Rapid Some 1+ Some 1+ Nml Nml N/A  Right RectFemoris Nml Nml Nml Nml Nml Nml Nml Nml Nml Nml Nml Nml N/A  Right GluteusMed Nml Nml Nml Nml Nml Nml Nml Nml Nml Nml Nml Nml N/A      Waveforms:

## 2016-05-31 ENCOUNTER — Encounter: Payer: Self-pay | Admitting: *Deleted

## 2016-05-31 ENCOUNTER — Telehealth: Payer: Self-pay | Admitting: *Deleted

## 2016-05-31 NOTE — Telephone Encounter (Signed)
-----   Message from [place] Chardonika K Patel, DO sent at 05/31/2016 10:45 AM EST ----- Please give patient the contact number for rehab to schedule his appointments.  I already discussed the results of his EMG. thanks

## 2016-05-31 NOTE — Telephone Encounter (Signed)
The # sent to patient via My Chart.

## 2016-06-05 ENCOUNTER — Ambulatory Visit: Payer: Medicare Other | Attending: Neurology | Admitting: Physical Therapy

## 2016-06-05 DIAGNOSIS — M6281 Muscle weakness (generalized): Secondary | ICD-10-CM | POA: Diagnosis present

## 2016-06-05 DIAGNOSIS — R2681 Unsteadiness on feet: Secondary | ICD-10-CM | POA: Diagnosis present

## 2016-06-05 DIAGNOSIS — R2689 Other abnormalities of gait and mobility: Secondary | ICD-10-CM | POA: Diagnosis not present

## 2016-06-05 DIAGNOSIS — R278 Other lack of coordination: Secondary | ICD-10-CM | POA: Insufficient documentation

## 2016-06-05 DIAGNOSIS — R208 Other disturbances of skin sensation: Secondary | ICD-10-CM | POA: Insufficient documentation

## 2016-06-05 NOTE — Therapy (Signed)
[facility] [address] [place], [place], [address] Phone: [phone]   Fax:  [phone]  Physical Therapy Evaluation  Patient Details  Name: Tanner Mason MRN: [id number] Date of Birth: 06/11/1940 Referring Provider: Glendale Chard  Encounter Date: 06/05/2016      PT End of Session - 06/05/16 1454    Visit Number 1   Number of Visits 8   Date for PT Re-Evaluation 07/03/16   PT Start Time 1340   PT Stop Time 1420   PT Time Calculation (min) 40 min   Activity Tolerance Patient tolerated treatment well   Behavior During Therapy [facility] for tasks assessed/performed      Past Medical History:  Diagnosis Date  . Abdominal ultrasound, abnormal 02/2004   fatty liver  . Cellulitis and abscess 07/14/2015  . Diabetes mellitus without complication (HCC)   . Elbow dislocation [id number]   Right  . Encounter for diagnostic endoscopy 10/22/04   negative  . History of esophagogastroduodenoscopy 08/13/2007   normal  . Treadmill stress test negative for angina pectoris 06/2002   poor HR and BP recovery    Past Surgical History:  Procedure Laterality Date  . ANTERIOR CRUCIATE LIGAMENT REPAIR  5/98   Left, staph infection complicated  . BASAL CELL CARCINOMA EXCISION  02/2005   R ear  . BLEPHAROPLASTY  03/2005   with ptosis repair  . INGUINAL HERNIA REPAIR Right 1947  . INGUINAL HERNIA REPAIR Left 1970    There were no vitals filed for this visit.       Subjective Assessment - 06/05/16 1342    Subjective Pt is a 76 y/o male who returns to OPPT for decreased balance and strength in legs.  Pt was at this clinic June-Aug 2017 for GBS, and upon d/c felt balance and mobility improved.  Pt reports gradual decline and difficulty with walking (previously able to walk 1 mile).     Pertinent History uncontrolled DM with neuropathy, L foot drop, GBS, spinal stenosis   Diagnostic tests EMG/NCV: neuropathy   Patient Stated  Goals improve balance and strength, open to bracing   Currently in Pain? No/denies            [facility] PT Assessment - 06/05/16 1346      Assessment   Medical Diagnosis neuropathy, balance   Referring Provider PATEL, DONIKA K   Onset Date/Surgical Date --  approx. Sept/Oct 2017   Next MD Visit 09/16/16   Prior Therapy PT/OT at this clinic following GBS     Precautions   Precautions Fall     Restrictions   Weight Bearing Restrictions No     Balance Screen   Has the patient fallen in the past 6 months No   Has the patient had a decrease in activity level because of a fear of falling?  No   Is the patient reluctant to leave their home because of a fear of falling?  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   Leisure [employer] 3x/wk, go to [employer]   Overall Cognitive Status Within Functional Limits for tasks assessed     Observation/Other Assessments   Focus on Therapeutic Outcomes (FOTO)  55 (45% limited; predicted 34% limited)   Activities of Balance Confidence Scale (ABC Scale)  66.9%     Functional Tests   Functional tests Single leg stance     Single Leg Stance   Comments RLE: 5 sec; LLE: 8 sec     Strength   Strength Assessment Site Hip;Knee;Ankle   Right/Left Hip Right;Left   Right Hip Flexion 5/5   Left Hip Flexion 5/5   Right/Left Knee Right;Left   Right Knee Flexion 5/5   Right Knee Extension 5/5   Left Knee Flexion 5/5   Left Knee Extension 5/5   Right/Left Ankle Right;Left   Right Ankle  Dorsiflexion 3/5   Right Ankle Plantar Flexion 2/5   Right Ankle Inversion 4/5   Right Ankle Eversion 3+/5   Left Ankle Dorsiflexion 3-/5   Left Ankle Plantar Flexion 2-/5   Left Ankle Inversion 4/5   Left Ankle Eversion 3/5     Ambulation/Gait   Ambulation/Gait Yes   Ambulation/Gait Assistance 7: Independent   Assistive device None   Gait Pattern Poor foot clearance - left;Poor foot clearance - right   Gait Comments decreased push off noted bil; bil foot slap, trialed foot up AFO with decreased foot slap noted on L and improved foot clearance     Standardized Balance Assessment   Standardized Balance Assessment Berg Balance Test;Dynamic Gait Index     Berg Balance Test   Sit to Stand Able to stand without using hands and stabilize independently   Standing Unsupported Able to stand safely 2 minutes   Sitting with Back Unsupported but Feet Supported on Floor or Stool Able to sit safely and securely 2 minutes   Stand to Sit Sits safely with minimal use of hands   Transfers Able to transfer safely, minor use of hands   Standing Unsupported with Eyes Closed Able to stand 10 seconds with supervision   Standing Ubsupported with Feet Together Able to place feet together independently and stand 1 minute safely   From Standing, Reach Forward with Outstretched Arm Can reach forward >12 cm safely (5")   From Standing Position, Pick up Object from Floor Able to pick up shoe safely and easily   From Standing Position, Turn to Look Behind Over each Shoulder Looks behind from both sides and weight shifts well   Turn 360 Degrees Able to turn 360 degrees safely one side only in 4 seconds or less   Standing Unsupported, Alternately Place Feet on Step/Stool Able to complete 4 steps without aid or supervision   Standing Unsupported, One Foot in Front Able to plae foot ahead of the other independently and hold 30 seconds   Standing on One Leg Able to lift leg independently and hold 5-10 seconds   Total  Score 49     Dynamic Gait Index   Level Surface Normal   Change in Gait Speed Normal   Gait with Horizontal Head Turns Mild Impairment   Gait with Vertical Head Turns Mild Impairment   Gait and Pivot Turn Normal   Step Over Obstacle Mild Impairment   Step Around Obstacles Normal   Steps Normal   Total Score 21  PT Education - 06/05/16 1452    Education provided Yes   Education Details foot up AFO and where to purchase   Person(s) Educated Patient   Methods Explanation   Comprehension Verbalized understanding             PT Long Term Goals - 06/05/16 1459      PT LONG TERM GOAL #1   Title independent with HEP (Target Date: 07/03/16)   Status New     PT LONG TERM GOAL #2   Title improve BERG balance score to >/= 52/56 for improved balance (Target Date: 07/03/16)   Status New     PT LONG TERM GOAL #3   Title improve dynamic gait index to >/= 23/24 for improved functional mobiltiy (Target Date: 07/03/16)   Status New     PT LONG TERM GOAL #4   Title ambulate > 1000' on various indoor/outdoor surfaces independently (with foot up) with improved foot clearance and foot slap for improved functional mobility (Target Date: 07/03/16)   Status New     PT LONG TERM GOAL #5   Title n/a               Plan - 06/05/16 1456    Clinical Impression Statement Pt is a 76 y/o male who returns to OPPT for low complexity PT eval for neuropathy resulting in decreased balance and functional mobility.  Pt is moderate fall risk and demonstrates gait abnormalities due to bil ankle weakness.  Trialed foot up AFO today with improved foot clearance and decreased foot slap noted on L.  Will benefit from PT to maximze function and address deficits listed.   Rehab Potential Good   PT Frequency 2x / week   PT Duration 4 weeks   PT Treatment/Interventions ADLs/Self Care Home Management;Neuromuscular re-education;Balance training;Therapeutic  exercise;Therapeutic activities;Functional mobility training;Gait training;Orthotic Fit/Training;Patient/family education;Vestibular   PT Next Visit Plan give HEP for ankle strengthening, gastroc stretching, gait with foot up, corner and dynamic balance   Consulted and Agree with Plan of Care Patient      Patient will benefit from skilled therapeutic intervention in order to improve the following deficits and impairments:  Abnormal gait, Decreased balance, Decreased mobility, Difficulty walking, Decreased strength  Visit Diagnosis: Other abnormalities of gait and mobility - Plan: PT plan of care cert/re-cert  Muscle weakness (generalized) - Plan: PT plan of care cert/re-cert  Unsteadiness on feet - Plan: PT plan of care cert/re-cert      G-Codes - 06/05/16 1502    Functional Assessment Tool Used BERG/DGI   Functional Limitation Mobility: Walking and moving around   Mobility: Walking and Moving Around Current Status [phone](G8978) At least 1 percent but less than 20 percent impaired, limited or restricted   Mobility: Walking and Moving Around Goal Status [phone](G8979) At least 1 percent but less than 20 percent impaired, limited or restricted       Problem List Patient Active Problem List   Diagnosis Date Noted  . Viral illness 05/23/2016  . Constipation   . Thrombocytopenia (HCC)   . Normocytic anemia   . Back pain   . Hypomagnesemia   . Type 2 diabetes mellitus with diabetic neuropathy, unspecified (HCC)   . Guillain Barr syndrome (HCC)   . Weakness 09/17/2015  . Gait abnormality 09/17/2015  . Gait instability 09/17/2015  . Preventative health care 11/08/2014  . Angioedema 07/13/2014  . Left foot drop 04/05/2014  . Lumbar back pain with radiculopathy affecting left lower extremity 04/05/2014  .  Atypical chest pain 12/14/2013  . Overweight (BMI 25.0-29.9) 09/15/2012  . Stage 3 chronic renal impairment associated with type 2 diabetes mellitus (HCC) 07/20/2010  . ULNAR NERVE  ENTRAPMENT, RIGHT 06/07/2008  . Diabetes mellitus (HCC) 12/09/2007  . Essential hypertension, benign 12/08/2007  . DIASTASIS RECTI 12/08/2007  . Pure hypercholesterolemia 06/26/2006  . ANEMIA, OTHER, UNSPECIFIED 06/26/2006  . Peripheral autonomic neuropathy due to diabetes mellitus (HCC) 06/26/2006  . GASTROESOPHAGEAL REFLUX DISEASE, CHRONIC 06/26/2006  . Enlarged prostate with lower urinary tract symptoms (LUTS) 06/26/2006     Clarita Crane, PT, DPT 06/05/16 3:04 PM   [facility] [address] [place], [place], [address] Phone: [phone]   Fax:  [phone]  Name: Tanner Mason MRN: [id number] Date of Birth: 1940-10-02

## 2016-06-12 ENCOUNTER — Encounter: Payer: Self-pay | Admitting: Physical Therapy

## 2016-06-12 ENCOUNTER — Ambulatory Visit: Payer: Medicare Other | Admitting: Physical Therapy

## 2016-06-12 ENCOUNTER — Telehealth: Payer: Self-pay | Admitting: Neurology

## 2016-06-12 ENCOUNTER — Other Ambulatory Visit: Payer: Self-pay | Admitting: Family Medicine

## 2016-06-12 DIAGNOSIS — R2689 Other abnormalities of gait and mobility: Secondary | ICD-10-CM

## 2016-06-12 DIAGNOSIS — R208 Other disturbances of skin sensation: Secondary | ICD-10-CM

## 2016-06-12 DIAGNOSIS — R278 Other lack of coordination: Secondary | ICD-10-CM

## 2016-06-12 DIAGNOSIS — R2681 Unsteadiness on feet: Secondary | ICD-10-CM

## 2016-06-12 DIAGNOSIS — M6281 Muscle weakness (generalized): Secondary | ICD-10-CM

## 2016-06-12 NOTE — Telephone Encounter (Signed)
PT left a message regarding some numbness in his ankle and would like a call back/Dawn CB# [phone]

## 2016-06-12 NOTE — Patient Instructions (Addendum)
Perform these next to kitchen counter top for balance assistance: Walking on Heels   At counter: Walk on heels forward while continuing on a straight path, and then walk on heels backward to starting position. Repeat for 3 laps each way. Do _1-2__ sessions per day.  Copyright  VHI. All rights reserved.  Walking on Toes   At counter for balance as needed: Walk on toes forward while continuing on a straight path, and then backwards on toes to starting position. Repeat 3 laps each way. Do _1-2___ sessions per day.  Copyright  VHI. All rights reserved.  Feet Heel-Toe "Tandem"   At counter: Arms at sides, walk a straight line forward bringing one foot directly in front of the other, and then a straight line backwards bringing one foot directly behind the other one.  Repeat for _3 laps each way. Do _1-2_ sessions per day.  Copyright  VHI. All rights reserved.   Perform these in a corner with a chair in front of you for safety: Feet Together (Compliant Surface) Varied Arm Positions - Eyes Closed    Stand on compliant surface: pillow/s with feet together and arms as needed for balance. Close eyes and visualize upright position. Hold_30_ seconds. Repeat _3_ times per session. Do _1-2_ sessions per day.  Copyright  VHI. All rights reserved.    Feet Together (Compliant Surface) Head Motion - Eyes Open    With eyes open, standing on compliant surface: _pillow/s_, feet together, move head slowly: 1. Up and down 2. Left and right Repeat _10_ times each way. Do __1-2__ sessions per day.  Copyright  VHI. All rights reserved.   Feet Apart (Compliant Surface) Head Motion - Eyes Closed    Stand on compliant surface: _pillow/s_ with feet shoulder width apart. Close eyes and move head slowly: 1. Up and down 2. Left and right Repeat __10__ times each way. Do 1-2_ sessions per day.  Copyright  VHI. All rights reserved.

## 2016-06-12 NOTE — Therapy (Signed)
[facility] [address] [place], [place], [address] Phone: [phone]   Fax:  [phone]  Physical Therapy Treatment  Patient Details  Name: Tanner Mason MRN: [id number] Date of Birth: 1940-10-29 Referring Provider: Glendale ChardPATEL, DONIKA K  Encounter Date: 06/12/2016      PT End of Session - 06/12/16 1243    Visit Number 2   Number of Visits 8   Date for PT Re-Evaluation 07/03/16   PT Start Time 1233   PT Stop Time 1315   PT Time Calculation (min) 42 min   Activity Tolerance Patient tolerated treatment well   Behavior [facility] for tasks assessed/performed      Past Medical History:  Diagnosis Date  . Abdominal ultrasound, abnormal 02/2004   fatty liver  . Cellulitis and abscess 07/14/2015  . Diabetes mellitus without complication (HCC)   . Elbow dislocation [id number]   Right  . Encounter for diagnostic endoscopy 10/22/04   negative  . History of esophagogastroduodenoscopy 08/13/2007   normal  . Treadmill stress test negative for angina pectoris 06/2002   poor HR and BP recovery    Past Surgical History:  Procedure Laterality Date  . ANTERIOR CRUCIATE LIGAMENT REPAIR  5/98   Left, staph infection complicated  . BASAL CELL CARCINOMA EXCISION  02/2005   R ear  . BLEPHAROPLASTY  03/2005   with ptosis repair  . INGUINAL HERNIA REPAIR Right 1947  . INGUINAL HERNIA REPAIR Left 1970    There were no vitals filed for this visit.      Subjective Assessment - 06/12/16 1236    Subjective No falls or pain to report. Does report the tightness/constriction has moved from just the ankles/feet, up to the knee now on both legs. Pt unsure if the neuropathy would move this fast and should he call his MD. Algis DownsAdvised pt with h/o GBS and new diagnosis of neuropathy it would be good to call Dr. Allena KatzPatel to see if she wants to see him before his next appt in May. Pt has also purchased the foot up brace and feels it does  help the left foot not to catch.    Pertinent History uncontrolled DM with neuropathy, L foot drop, GBS, spinal stenosis   Diagnostic tests EMG/NCV: neuropathy   Patient Stated Goals improve balance and strength, open to bracing   Currently in Pain? No/denies   Pain Score 0-No pain              Balance Exercises - 06/12/16 1304      Balance Exercises: Standing   Balance Beam blue foam beam in parallel bars: alternating fwd stepping to floor and back onto foam x 10 reps each side, alternating bwd stepping to floor and back onto foam x 10 reps each leg, both with min guard to min assist for balance with cues on posture and weight shifting. No UE support with occasional touch to bars for balance correction. standing with feet across foam: EC no head movements 20 sec's x 2, EC head movements left<>right and up<>down x 10 reps each with min assist, cues on posture and weight shifting to assist with correcting balance, occasional touch to bars to assist with balance.                              issued the following to pt's HEP: Perform these next to kitchen counter top for balance assistance: Walking on Heels  At counter: Walk on heels forward while continuing on a straight path, and then walk on heels backward to starting position. Repeat for 3 laps each way. Do _1-2__ sessions per day.  Copyright  VHI. All rights reserved.  Walking on Toes   At counter for balance as needed: Walk on toes forward while continuing on a straight path, and then backwards on toes to starting position. Repeat 3 laps each way. Do _1-2___ sessions per day.  Copyright  VHI. All rights reserved.  Feet Heel-Toe "Tandem"   At counter: Arms at sides, walk a straight line forward bringing one foot directly in front of the other, and then a straight line backwards bringing one foot directly behind the other one.  Repeat for _3 laps each way. Do _1-2_ sessions per day.  Copyright  VHI. All rights reserved.    Perform these in a corner with a chair in front of you for safety: Feet Together (Compliant Surface) Varied Arm Positions - Eyes Closed    Stand on compliant surface: pillow/s with feet together and arms as needed for balance. Close eyes and visualize upright position. Hold_30_ seconds. Repeat _3_ times per session. Do _1-2_ sessions per day.  Copyright  VHI. All rights reserved.    Feet Together (Compliant Surface) Head Motion - Eyes Open    With eyes open, standing on compliant surface: _pillow/s_, feet together, move head slowly: 1. Up and down 2. Left and right Repeat _10_ times each way. Do __1-2__ sessions per day.  Copyright  VHI. All rights reserved.   Feet Apart (Compliant Surface) Head Motion - Eyes Closed    Stand on compliant surface: _pillow/s_ with feet shoulder width apart. Close eyes and move head slowly: 1. Up and down 2. Left and right Repeat __10__ times each way. Do 1-2_ sessions per day.  Copyright  VHI. All rights reserved.       PT Education - 06/12/16 1646    Education provided Yes   Education Details HEP for balance   Person(s) Educated Patient   Methods Explanation;Demonstration;Verbal cues;Handout   Comprehension Verbalized understanding;Returned demonstration;Verbal cues required;Need further instruction             PT Long Term Goals - 06/05/16 1459      PT LONG TERM GOAL #1   Title independent with HEP (Target Date: 07/03/16)   Status New     PT LONG TERM GOAL #2   Title improve BERG balance score to >/= 52/56 for improved balance (Target Date: 07/03/16)   Status New     PT LONG TERM GOAL #3   Title improve dynamic gait index to >/= 23/24 for improved functional mobiltiy (Target Date: 07/03/16)   Status New     PT LONG TERM GOAL #4   Title ambulate > 1000' on various indoor/outdoor surfaces independently (with foot up) with improved foot clearance and foot slap for improved functional mobility (Target Date: 07/03/16)    Status New     PT LONG TERM GOAL #5   Title n/a              Plan - 06/12/16 1243    Clinical Impression Statement today's skilled session addressed establishment of HEP and remainder of session focused on high level balance activites. No issues reported with session. Pt is making steady progress toward goals and should benefit from continued PT to progress toward unmet goals.   Rehab Potential Good   PT Frequency 2x / week   PT Duration  4 weeks   PT Treatment/Interventions ADLs/Self Care Home Management;Neuromuscular re-education;Balance training;Therapeutic exercise;Therapeutic activities;Functional mobility training;Gait training;Orthotic Fit/Training;Patient/family education;Vestibular   PT Next Visit Plan ankle strengthening, gastroc stretching, gait with foot up, corner and dynamic balance on complaint surfaces   Consulted and Agree with Plan of Care Patient      Patient will benefit from skilled therapeutic intervention in order to improve the following deficits and impairments:  Abnormal gait, Decreased balance, Decreased mobility, Difficulty walking, Decreased strength  Visit Diagnosis: Other abnormalities of gait and mobility  Muscle weakness (generalized)  Unsteadiness on feet  Other disturbances of skin sensation  Other lack of coordination     Problem List Patient Active Problem List   Diagnosis Date Noted  . Viral illness 05/23/2016  . Constipation   . Thrombocytopenia (HCC)   . Normocytic anemia   . Back pain   . Hypomagnesemia   . Type 2 diabetes mellitus with diabetic neuropathy, unspecified (HCC)   . Guillain Barr syndrome (HCC)   . Weakness 09/17/2015  . Gait abnormality 09/17/2015  . Gait instability 09/17/2015  . Preventative health care 11/08/2014  . Angioedema 07/13/2014  . Left foot drop 04/05/2014  . Lumbar back pain with radiculopathy affecting left lower extremity 04/05/2014  . Atypical chest pain 12/14/2013  . Overweight (BMI  25.0-29.9) 09/15/2012  . Stage 3 chronic renal impairment associated with type 2 diabetes mellitus (HCC) 07/20/2010  . ULNAR NERVE ENTRAPMENT, RIGHT 06/07/2008  . Diabetes mellitus (HCC) 12/09/2007  . Essential hypertension, benign 12/08/2007  . DIASTASIS RECTI 12/08/2007  . Pure hypercholesterolemia 06/26/2006  . ANEMIA, OTHER, UNSPECIFIED 06/26/2006  . Peripheral autonomic neuropathy due to diabetes mellitus (HCC) 06/26/2006  . GASTROESOPHAGEAL REFLUX DISEASE, CHRONIC 06/26/2006  . Enlarged prostate with lower urinary tract symptoms (LUTS) 06/26/2006    Sallyanne Kuster, PTA, [facility] [address], [address] [place], [place] [address] [phone] 06/12/16, 4:47 PM   Name: Tanner Mason MRN: [id number] Date of Birth: 1940/07/18

## 2016-06-13 ENCOUNTER — Telehealth: Payer: Self-pay | Admitting: Neurology

## 2016-06-13 NOTE — Telephone Encounter (Signed)
Yes, please have him return to see me  - add him on 3/1 at 3:30p.

## 2016-06-13 NOTE — Telephone Encounter (Signed)
Patient notified of appointment.  

## 2016-06-13 NOTE — Telephone Encounter (Signed)
PT left a message asking for a call back concerning an issue since his last appointment/Dawn CB# [phone]

## 2016-06-13 NOTE — Telephone Encounter (Signed)
Patient said that the numbness has now moved up to his knee.  Does he need to be seen?

## 2016-06-14 ENCOUNTER — Encounter: Payer: Self-pay | Admitting: Neurology

## 2016-06-14 ENCOUNTER — Encounter: Payer: Self-pay | Admitting: Physical Therapy

## 2016-06-14 ENCOUNTER — Ambulatory Visit (INDEPENDENT_AMBULATORY_CARE_PROVIDER_SITE_OTHER): Payer: Medicare Other | Admitting: Neurology

## 2016-06-14 ENCOUNTER — Other Ambulatory Visit (INDEPENDENT_AMBULATORY_CARE_PROVIDER_SITE_OTHER): Payer: Medicare Other

## 2016-06-14 ENCOUNTER — Ambulatory Visit: Payer: Medicare Other | Admitting: Physical Therapy

## 2016-06-14 VITALS — BP 110/68 | HR 87 | Ht 67.0 in | Wt 165.0 lb

## 2016-06-14 DIAGNOSIS — E0842 Diabetes mellitus due to underlying condition with diabetic polyneuropathy: Secondary | ICD-10-CM

## 2016-06-14 DIAGNOSIS — R2689 Other abnormalities of gait and mobility: Secondary | ICD-10-CM | POA: Diagnosis not present

## 2016-06-14 DIAGNOSIS — R2681 Unsteadiness on feet: Secondary | ICD-10-CM

## 2016-06-14 DIAGNOSIS — M6281 Muscle weakness (generalized): Secondary | ICD-10-CM

## 2016-06-14 LAB — FOLATE: Folate: >23.4 ng/mL (ref >5.9)

## 2016-06-14 LAB — C-REACTIVE PROTEIN: CRP: <0.1 mg/dL — ABNORMAL LOW (ref 0.5–20.0)

## 2016-06-14 LAB — SEDIMENTATION RATE: Sed Rate: 3 mm/hr (ref 0–20)

## 2016-06-14 NOTE — Progress Notes (Signed)
Follow-up Visit   Date: 06/14/16    Tanner Mason MRN: [id number] DOB: 1941/04/14   Interim History: Tanner Mason is a 76 y.o. right-handed Caucasian male with insulin-dependent diabetes mellitus c/b renal insufficiency, hypertension, basal cell carcinoma of the ear s/p resection, hyperlipidemia, and Guillian-Barre syndrome (May 20180 returning to the clinic with complaints of worsening numbness of the legs.   History of present illness: He was visiting [place] at [facility] and he suddenly developed worsening tingling of the feet which began involving his lower leg.  He was evaluated at the ER in [place] where MRI brain and lumbar spine was performed.  The following day he came to [facility], he was unable to walk which prompted him to go to the [facility] ED.  By the time he arrived at the ED, his tingling and numbness involved the level of thigh.  Patient was hospitalized at [facility] from 5/21 - 09/26/2015 with progressive numbness of the feet, imbalance, and weakness.  His MRI lumbar spine showed mild-moderate stenosis at L4-5 and L5-S1.  Due to clinical exam showing arreflexia, there was a high suspicion for GBS, and his CSF testing was consistent with this showing albuminocytologic dissociation and elevated IgG index.  Of note, his CSF glucose was also elevated, but serum glucose on the same day was 280-363.  He completed 5 sessions of plasmapheresis. After the 3rd session, he began noticing improved paresthesias. He developed dysautonomia manifesting with orthostasis for which his blood pressure medications were adjusted.  He was discharged to rehab facility and went home on 6/12 with a walker and out-patient PT.   He has been doing out-patient physical therapy and noticed significant improvement and able to walk unassisted.  His balance is still a problem, and he has not had any falls.  His strength is improving and now his paresthesias are isolated to his feet,  which is where it used to be given his history of neuropathy.  He also has left foot weakness which has been present for at least the past 4 years.  He was told that he may need back surgery for this some time back, but decided to pursue it conservatively. Overall, he feels that is is very close to his baseline.    UPDATE 05/27/2016:  Over the past several months, he feels that his imbalance has been worsening and he has tightness/numbness to the level of his ankles, where as previous to his GBS, his neuropathy was restricted to his feet.  He denies any painful paresthesias, which has been well controlled on gabapentin 976m TID. He continues to walk unassisted and does not use a ankle brace, but has noticed that walking takes more conscientious effort.  He has not suffered any falls, but wife states he does stumble.  He has not noticed any new weakness.  His left foot continues to be weak (old).  UPDATE 06/14/2016:  Patient scheduled a sooner appointment because last week, he started having a tight sensation over his entire lower leg and ankle up to his knees. Symptoms are constant and he has not noticed anything to trigger it.   He denies tingling and burning.  He feels that his knees do not feel as stable, but is still walking independently without a cane.    Medications:  Current Outpatient Prescriptions on File Prior to Visit  Medication Sig Dispense Refill  . amLODipine (NORVASC) 5 MG tablet TAKE 1 TABLET (5 MG TOTAL) BY MOUTH DAILY. 30 tablet  0  . aspirin ([employer] ASPIRIN) 81 MG chewable tablet Chew 81 mg by mouth daily.      Marland Kitchen atorvastatin (LIPITOR) 40 MG tablet TAKE ONE TABLET BY MOUTH ONE TIME DAILY 90 tablet 1  . BD PEN NEEDLE NANO U/F 32G X 4 MM MISC USE AS INSTRUCTED BY YOUR PHYSICIAN TO CHECK BLOOD SUGAR 100 each 2  . cetirizine (ZYRTEC) 10 MG tablet Take 10 mg by mouth every evening.    . diphenhydrAMINE (BENADRYL) 12.5 MG/5ML elixir Take 25 mg by mouth 4 (four) times daily as  needed for allergies.    . DUREZOL 0.05 % EMUL INSTILL 1 DROP INTO RIGHT EYE 4 TIMES A DAY  0  . EPIPEN 2-PAK 0.3 MG/0.3ML SOAJ injection INJECT 0.3MLS INTO THE MUSLE ONCE 2 Device 1  . FERREX 150 150 MG capsule TAKE 1 CAPSULE (150 MG TOTAL) BY MOUTH 2 (TWO) TIMES DAILY BEFORE LUNCH AND SUPPER. 60 capsule 0  . finasteride (PROSCAR) 5 MG tablet TAKE 1 TABLET BY MOUTH DAILY. 90 tablet 1  . gabapentin (NEURONTIN) 300 MG capsule TAKE 3 CAPSULES (900 MG TOTAL) BY MOUTH 3 (THREE) TIMES DAILY. 270 capsule 5  . insulin lispro (HUMALOG KWIKPEN) 100 UNIT/ML KiwkPen Inject 0.05 mLs (5 Units total) into the skin 3 (three) times daily. 5 pen 2  . LANTUS SOLOSTAR 100 UNIT/ML Solostar Pen INJECT 40 UNITS INTO THE SKIN DAILY AT 10 PM. 15 pen 2  . Metoprolol Tartrate 75 MG TABS TAKE 75 MG BY MOUTH 2 (TWO) TIMES DAILY. 60 tablet 3  . omeprazole (PRILOSEC) 20 MG capsule TAKE ONE CAPSULE BY MOUTH ONE TIME DAILY 90 capsule 1  . ONE TOUCH ULTRA TEST test strip USE TO CHECK BLOOD SUGAR THREE TIMES DAILY 100 each 2  . oseltamivir (TAMIFLU) 75 MG capsule Take 1 capsule (75 mg total) by mouth daily. 5 capsule 0  . potassium chloride (K-DUR,KLOR-CON) 10 MEQ tablet TAKE TWO TABLETS BY MOUTH DAILY 180 tablet 1  . predniSONE (DELTASONE) 10 MG tablet Take one tablet as needed for tongue swelling. 10 tablet 1  . PROLENSA 0.07 % SOLN 1 DROP INTO RIGHT EYE ONCE A DAY, BEGIN 2 DAYS BEFORE SURGERY  0  . senna-docusate (SENOKOT-S) 8.6-50 MG tablet Take 2 tablets by mouth 2 (two) times daily. 100 tablet 0  . simethicone (MYLICON) 80 MG chewable tablet Chew 1 tablet (80 mg total) by mouth 3 (three) times daily before meals. 120 tablet 0  . tamsulosin (FLOMAX) 0.4 MG CAPS capsule TAKE ONE CAPSULE BY MOUTH ONE TIME DAILY 90 capsule 1  . traMADol (ULTRAM) 50 MG tablet Take 1 tablet (50 mg total) by mouth every 6 (six) hours as needed. 30 tablet 0  . VICTOZA 18 MG/3ML SOPN INJECT 1.8MG INTO THE SKIN DAILY 3 pen 2  . VIGAMOX 0.5 %  ophthalmic solution 1 DROP INTO RIGHT EYE 4 X A DAY STARTING 2 DAYS PRIOR TO SURGERY AND 2 DROPS MORNING OF SURGERY  0   No current facility-administered medications on file prior to visit.     Allergies:  Allergies  Allergen Reactions  . Ace Inhibitors Other (See Comments)    REACTION: Angioedema  . Calcium Carbonate Antacid Other (See Comments)    REACTION: Angioedema of mouth    Review of Systems:  CONSTITUTIONAL: No fevers, chills, night sweats, or weight loss.  EYES: No visual changes or eye pain ENT: No hearing changes.  No history of nose bleeds.   RESPIRATORY: No cough, wheezing  and shortness of breath.   CARDIOVASCULAR: Negative for chest pain, and palpitations.   GI: Negative for abdominal discomfort, blood in stools or black stools.  No recent change in bowel habits.   GU:  No history of incontinence.   MUSCLOSKELETAL: No history of joint pain or swelling.  No myalgias.   SKIN: Negative for lesions, rash, and itching.   ENDOCRINE: Negative for cold or heat intolerance, polydipsia or goiter.   PSYCH:  No depression or anxiety symptoms.   NEURO: As Above.   Vital Signs:  BP 110/68   Pulse 87   Ht '5\' 7"'  (1.702 m)   Wt 165 lb (74.8 kg)   SpO2 98%   BMI 25.84 kg/m   Neurological Exam: MENTAL STATUS including orientation to time, place, person, recent and remote memory, attention span and concentration, language, and fund of knowledge is normal.  Speech is not dysarthric.  CRANIAL NERVES:  Right pupil is has been pharmacologically dilated (he came from his eye doctors office).  Left pupil is round and reactive. Normal conjugate, extra-ocular eye movements in all directions of gaze.  Left ptosis (old). Normal facial sensation.  Face is symmetric. Palate elevates symmetrically.  Tongue is midline.  MOTOR:  Motor strength is 5/5 in all extremities, except L dorsiflexion, toe extension and eversion is 4/5.  Bilateral toe flexion is 5-/5, right toe extension is 5-/5. No  atrophy, fasciculations or abnormal movements.  No pronator drift.  Tone is normal.    MSRs:  Right                                                                 Left brachioradialis 2+  brachioradialis 2+  biceps 2+  biceps 2+  triceps 1+  triceps 1+  patellar 0  Patellar 0  ankle jerk 0  ankle jerk 0   SENSORY:  Vibration absent at the ankles bilaterally, intact at the knees.  Positive Rhomberg.   COORDINATION/GAIT:  Normal finger-to- nose-finger.  Intact rapid alternating movements bilaterally.  There is mild dragging of the left foot and poor arm swing on the right, but overall gait is stable, unassisted.  He can toe walk, but unable to heel walk on the left.  Data: MRI lumbar spine wo contradst 09/19/2015: 1. Overall stable appearance of the lumbar spine as compared to previous MRI from 04/13/2014. No significant canal stenosis. No evidence for cord compression. 2. Similar effacement of the fat around the descending left L5 nerve root in the left L4-5 lateral recess, again suspicious for a small sequestered disc fragment. This could potentially affect the transiting left L5 nerve root. This is similar to prior. 3. Mild to moderate bilateral foraminal stenosis at L4-5 and L5-S1, stable.  NCS/EMG of the left side 05/30/2016: 1. Chronic sensorimotor polyneuropathy, predominantly axon loss in type, affecting the lower extremities; severe in degree electrically.  2. There is evidence of a superimposed chronic L5 radiculopathy on the left. 3. Left ulnar neuropathy with slowing across the elbow, demyelinating and axon loss in type.   CSF 09/18/2015:  R1  W1  G148*  P135*, IgG index 8.7, CSF cultures negative Las 09/18/2015:  CRP 0.6, vitamin B12 471, ANA neg, HIV neg, RPR, neg, TSH 1.32, HbA1c 7.4*, ESR 25  IMPRESSION/PLAN: 1.  Bilateral leg numbness - worsening.  Given his history of Guillain-Barre Syndrome (May 2017, treated with plasmapheresis) and acutely worsening numbness to the  level of the knees, he needs to be evaluated for transition into CIDP.  His recent NCS/EMG showed chronic sensorimotor polyneuropathy, predominately axonal and left ulnar neuropathy.  There was no temporal dispersion or conduction block.  His exam shows arreflexia in the legs, including loss of left patella jerk which was 1+ at the last visit.  His  motor strength is stable and continues to show left foot drop and distal weakness.  I will check labs for secondary causes of neuropathy, if these return normal, the next step is CSF analysis. Check ESR, CRP, vitamin B12, ACE, folate, MMA, ANA, copper, vitamin B1, SPEP with IFE, TSH  2.  Diabetic polyneuropathy affecting the feet.  Continue gabapentin 934m TID, may consider tapering this going forward.  3.  Left foot drop due to L5 radiculopathy, stable.   Return to clinic in 4 months   The duration of this appointment visit was 30 minutes of face-to-face time with the patient.  Greater than 50% of this time was spent in counseling, explanation of diagnosis, planning of further management, and coordination of care.   Thank you for allowing me to participate in patient's care.  If I can answer any additional questions, I would be pleased to do so.    Sincerely,    Key Cen K. PPosey Pronto DO

## 2016-06-14 NOTE — Patient Instructions (Signed)
1.  Check blood work 2.  If your labs return normal, we will need to proceed with spinal tap

## 2016-06-14 NOTE — Therapy (Signed)
[facility] [address] [place], [place], [address] Phone: [phone]   Fax:  [phone]  Physical Therapy Treatment  Patient Details  Name: Tanner Mason MRN: [id number] Date of Birth: [birth year] Referring Provider: Glendale Chard  Encounter Date: 06/14/2016      PT End of Session - 06/14/16 1402    Visit Number 3   Number of Visits 8   Date for PT Re-Evaluation 07/03/16   PT Start Time 1320   PT Stop Time 1401   PT Time Calculation (min) 41 min   Activity Tolerance Patient tolerated treatment well   Behavior During Therapy [facility] for tasks assessed/performed      Past Medical History:  Diagnosis Date  . Abdominal ultrasound, abnormal 02/2004   fatty liver  . Cellulitis and abscess 07/14/2015  . Diabetes mellitus without complication (HCC)   . Elbow dislocation 1324,4010   Right  . Encounter for diagnostic endoscopy 10/22/04   negative  . History of esophagogastroduodenoscopy 08/13/2007   normal  . Treadmill stress test negative for angina pectoris 06/2002   poor HR and BP recovery    Past Surgical History:  Procedure Laterality Date  . ANTERIOR CRUCIATE LIGAMENT REPAIR  5/98   Left, staph infection complicated  . BASAL CELL CARCINOMA EXCISION  02/2005   R ear  . BLEPHAROPLASTY  03/2005   with ptosis repair  . INGUINAL HERNIA REPAIR Right 1947  . INGUINAL HERNIA REPAIR Left 1970    There were no vitals filed for this visit.      Subjective Assessment - 06/14/16 1326    Subjective No problems or questions about HEP but states that holding toes up is difficult.  Did go to doctor about numbness-will be getting blood work done.  Still using foot up braces; asking questions about how to strengthen mm if the brace is taking the place of the mm.   Pertinent History uncontrolled DM with neuropathy, L foot drop, GBS, spinal stenosis   Diagnostic tests EMG/NCV: neuropathy   Patient Stated Goals  improve balance and strength, open to bracing   Currently in Pain? No/denies           [facility] Adult PT Treatment/Exercise - 06/14/16 1347      Exercises   Exercises Ankle     Ankle Exercises: Stretches   Gastroc Stretch 1 rep;60 seconds   Gastroc Stretch Limitations on rocker board     Ankle Exercises: Standing   Rocker Board Limitations heel and toe raises on rocker board with feet apart, and then in R and L partial tandem, squats 2 sets x 10 reps on rocker board and 2 sets x 10 second hold SLS on rocker board   Heel Raises 20 reps   Toe Raise 20 reps   Heel Walk (Round Trip) 1 forwards and retro, bilat UE support   Toe Walk (Round Trip) 1 forwards and retro, bilat UE support   Other Standing Ankle Exercises tandem gait x 2 reps with bilat UE support           Balance Exercises - 06/14/16 1350      Balance Exercises: Standing   Standing Eyes Opened Foam/compliant surface;10 secs  SLS   Other Standing Exercises Standing in corner performing R and LLE 3 cone tap in various patterns and in various directions including across midline           PT Long Term Goals - 06/05/16 1459  PT LONG TERM GOAL #1   Title independent with HEP (Target Date: 07/03/16)   Status New     PT LONG TERM GOAL #2   Title improve BERG balance score to >/= 52/56 for improved balance (Target Date: 07/03/16)   Status New     PT LONG TERM GOAL #3   Title improve dynamic gait index to >/= 23/24 for improved functional mobiltiy (Target Date: 07/03/16)   Status New     PT LONG TERM GOAL #4   Title ambulate > 1000' on various indoor/outdoor surfaces independently (with foot up) with improved foot clearance and foot slap for improved functional mobility (Target Date: 07/03/16)   Status New     PT LONG TERM GOAL #5   Title n/a            Plan - 06/14/16 1402    Clinical Impression Statement Continued to focus on dynamic ankle strengthening and balance training on rocker board, level surface  and compliant surface; added in vectors today for ankle strengthening in various planes of movement.  Pt noted to have intermittent episodes of knee buckling in single limb stance; pt reports this is new since numbness has progressed up his LE.  Will continue to address.   Rehab Potential Good   PT Treatment/Interventions ADLs/Self Care Home Management;Neuromuscular re-education;Balance training;Therapeutic exercise;Therapeutic activities;Functional mobility training;Gait training;Orthotic Fit/Training;Patient/family education;Vestibular   PT Next Visit Plan ankle strengthening, gastroc stretching, gait with foot up, corner and dynamic balance on complaint surfaces   Consulted and Agree with Plan of Care Patient      Patient will benefit from skilled therapeutic intervention in order to improve the following deficits and impairments:  Abnormal gait, Decreased balance, Decreased mobility, Difficulty walking, Decreased strength  Visit Diagnosis: Other abnormalities of gait and mobility  Muscle weakness (generalized)  Unsteadiness on feet     Problem List Patient Active Problem List   Diagnosis Date Noted  . Viral illness 05/23/2016  . Constipation   . Thrombocytopenia (HCC)   . Normocytic anemia   . Back pain   . Hypomagnesemia   . Type 2 diabetes mellitus with diabetic neuropathy, unspecified (HCC)   . Guillain Barr syndrome (HCC)   . Weakness 09/17/2015  . Gait abnormality 09/17/2015  . Gait instability 09/17/2015  . Preventative health care 11/08/2014  . Angioedema 07/13/2014  . Left foot drop 04/05/2014  . Lumbar back pain with radiculopathy affecting left lower extremity 04/05/2014  . Atypical chest pain 12/14/2013  . Overweight (BMI 25.0-29.9) 09/15/2012  . Stage 3 chronic renal impairment associated with type 2 diabetes mellitus (HCC) 07/20/2010  . ULNAR NERVE ENTRAPMENT, RIGHT 06/07/2008  . Diabetes mellitus (HCC) 12/09/2007  . Essential hypertension, benign  12/08/2007  . DIASTASIS RECTI 12/08/2007  . Pure hypercholesterolemia 06/26/2006  . ANEMIA, OTHER, UNSPECIFIED 06/26/2006  . Peripheral autonomic neuropathy due to diabetes mellitus (HCC) 06/26/2006  . GASTROESOPHAGEAL REFLUX DISEASE, CHRONIC 06/26/2006  . Enlarged prostate with lower urinary tract symptoms (LUTS) 06/26/2006    Edman CircleAudra Hall, PT, DPT 06/14/16    2:11 PM   [facility] [address] [place], [place], [address] Phone: [phone]   Fax:  [phone]  Name: Tanner Mason MRN: [id number] Date of Birth: 26-Sep-1940

## 2016-06-15 ENCOUNTER — Other Ambulatory Visit: Payer: Self-pay | Admitting: Family Medicine

## 2016-06-16 LAB — COPPER, SERUM: Copper: 106 ug/dL (ref 70–175)

## 2016-06-17 LAB — ANGIOTENSIN CONVERTING ENZYME: Angiotensin-Converting Enzyme: 44 U/L (ref 9–67)

## 2016-06-17 LAB — ANA: Anti Nuclear Antibody(ANA): NEGATIVE

## 2016-06-18 ENCOUNTER — Encounter: Payer: Self-pay | Admitting: Physical Therapy

## 2016-06-18 ENCOUNTER — Ambulatory Visit: Payer: Medicare Other | Admitting: Physical Therapy

## 2016-06-18 DIAGNOSIS — R2681 Unsteadiness on feet: Secondary | ICD-10-CM

## 2016-06-18 DIAGNOSIS — M6281 Muscle weakness (generalized): Secondary | ICD-10-CM

## 2016-06-18 DIAGNOSIS — R2689 Other abnormalities of gait and mobility: Secondary | ICD-10-CM

## 2016-06-18 DIAGNOSIS — R278 Other lack of coordination: Secondary | ICD-10-CM

## 2016-06-18 LAB — PROTEIN ELECTROPHORESIS, SERUM
Albumin ELP: 4.8 g/dL (ref 3.8–4.8)
Alpha-1-Globulin: 0.3 g/dL (ref 0.2–0.3)
Alpha-2-Globulin: 0.7 g/dL (ref 0.5–0.9)
Beta 2: 0.3 g/dL (ref 0.2–0.5)
Beta Globulin: 0.5 g/dL (ref 0.4–0.6)
Gamma Globulin: 1.2 g/dL (ref 0.8–1.7)
Total Protein, Serum Electrophoresis: 7.9 g/dL (ref 6.1–8.1)

## 2016-06-18 LAB — IMMUNOFIXATION ELECTROPHORESIS
IgA: 87 mg/dL (ref 81–463)
IgG (Immunoglobin G), Serum: 616 mg/dL — ABNORMAL LOW (ref 694–1618)
IgM, Serum: 74 mg/dL (ref 48–271)

## 2016-06-18 LAB — METHYLMALONIC ACID, SERUM: Methylmalonic Acid, Quant: 158 nmol/L (ref 87–318)

## 2016-06-19 LAB — VITAMIN B1: Vitamin B1 (Thiamine): 22 nmol/L (ref 8–30)

## 2016-06-19 NOTE — Therapy (Signed)
[facility] [address] [place], [place], [address] Phone: [phone]   Fax:  [phone]  Physical Therapy Treatment  Patient Details  Name: Tanner Mason MRN: [id number] Date of Birth: 1940/10/29 Referring Provider: Glendale Chard  Encounter Date: 06/18/2016      PT End of Session - 06/18/16 1324    Visit Number 4   Number of Visits 8   Date for PT Re-Evaluation 07/03/16   PT Start Time 1320   PT Stop Time 1400   PT Time Calculation (min) 40 min   Equipment Utilized During Treatment Gait belt   Activity Tolerance Patient tolerated treatment well   Behavior During Therapy [facility] for tasks assessed/performed      Past Medical History:  Diagnosis Date  . Abdominal ultrasound, abnormal 02/2004   fatty liver  . Cellulitis and abscess 07/14/2015  . Diabetes mellitus without complication (HCC)   . Elbow dislocation 1324,4010   Right  . Encounter for diagnostic endoscopy 10/22/04   negative  . History of esophagogastroduodenoscopy 08/13/2007   normal  . Treadmill stress test negative for angina pectoris 06/2002   poor HR and BP recovery    Past Surgical History:  Procedure Laterality Date  . ANTERIOR CRUCIATE LIGAMENT REPAIR  5/98   Left, staph infection complicated  . BASAL CELL CARCINOMA EXCISION  02/2005   R ear  . BLEPHAROPLASTY  03/2005   with ptosis repair  . INGUINAL HERNIA REPAIR Right 1947  . INGUINAL HERNIA REPAIR Left 1970    There were no vitals filed for this visit.      Subjective Assessment - 06/18/16 1323    Subjective States he has not heard back from MD as yet about lab work, reports the MD is trying to rule between GBS excerbation vs neuropathy progression. No falls or pain to report.    Pertinent History uncontrolled DM with neuropathy, L foot drop, GBS, spinal stenosis   Diagnostic tests EMG/NCV: neuropathy   Patient Stated Goals improve balance and strength, open to bracing   Currently in Pain? No/denies   Pain Score 0-No pain             [facility] Adult PT Treatment/Exercise - 06/18/16 1330      Transfers   Transfers Sit to Stand;Stand to Sit   Number of Reps 10 reps;1 set   Comments seated with feet across blue foam beam: x 10 reps wtih cues for full upright posture and slow, controlled descent with sitting down.      High Level Balance   High Level Balance Activities Marching forwards;Marching backwards;Tandem walking  tandem fwd/bwd, toe walk fwd/bwd, heel walk fwd/bwd   High Level Balance Comments on red mats next to counter top: performed 3 laps each way with min guard to min assist with occasional UE support on counter top.              Balance Exercises - 06/18/16 1355      Balance Exercises: Standing   Balance Beam blue foam beam in parallel bars: alternating fwd heel taps to floor and back onto beam x 10 each leg, alternating bwd toe taps to floor and back onto beam x 10 reps each leg with min guard to min assist and light UE support on parallel bars; standing with feet across blue beam: EC no head movements 20 sec's x 3 reps, EC head movements left<>right and up<>down x 10 reps each with min assist, light touch to bars  at times for balance and cues on posture, weight shifting to assist balance.                                  PT Long Term Goals - 06/05/16 1459      PT LONG TERM GOAL #1   Title independent with HEP (Target Date: 07/03/16)   Status New     PT LONG TERM GOAL #2   Title improve BERG balance score to >/= 52/56 for improved balance (Target Date: 07/03/16)   Status New     PT LONG TERM GOAL #3   Title improve dynamic gait index to >/= 23/24 for improved functional mobiltiy (Target Date: 07/03/16)   Status New     PT LONG TERM GOAL #4   Title ambulate > 1000' on various indoor/outdoor surfaces independently (with foot up) with improved foot clearance and foot slap for improved functional mobility (Target Date: 07/03/16)    Status New     PT LONG TERM GOAL #5   Title n/a            Plan - 06/18/16 1325    Clinical Impression Statement today's skilled session continued to focus on high level balance and ankle strengthening without any issues reported. Pt is making steady progress toward goals and should benefit from continued PT to progress toward unmet goals.    Rehab Potential Good   PT Treatment/Interventions ADLs/Self Care Home Management;Neuromuscular re-education;Balance training;Therapeutic exercise;Therapeutic activities;Functional mobility training;Gait training;Orthotic Fit/Training;Patient/family education;Vestibular   PT Next Visit Plan ankle strengthening, gastroc stretching, gait with foot up, corner and dynamic balance on complaint surfaces   Consulted and Agree with Plan of Care Patient      Patient will benefit from skilled therapeutic intervention in order to improve the following deficits and impairments:  Abnormal gait, Decreased balance, Decreased mobility, Difficulty walking, Decreased strength  Visit Diagnosis: Other abnormalities of gait and mobility  Muscle weakness (generalized)  Unsteadiness on feet  Other lack of coordination     Problem List Patient Active Problem List   Diagnosis Date Noted  . Viral illness 05/23/2016  . Constipation   . Thrombocytopenia (HCC)   . Normocytic anemia   . Back pain   . Hypomagnesemia   . Type 2 diabetes mellitus with diabetic neuropathy, unspecified (HCC)   . Guillain Barr syndrome (HCC)   . Weakness 09/17/2015  . Gait abnormality 09/17/2015  . Gait instability 09/17/2015  . Preventative health care 11/08/2014  . Angioedema 07/13/2014  . Left foot drop 04/05/2014  . Lumbar back pain with radiculopathy affecting left lower extremity 04/05/2014  . Atypical chest pain 12/14/2013  . Overweight (BMI 25.0-29.9) 09/15/2012  . Stage 3 chronic renal impairment associated with type 2 diabetes mellitus (HCC) 07/20/2010  . ULNAR  NERVE ENTRAPMENT, RIGHT 06/07/2008  . Diabetes mellitus (HCC) 12/09/2007  . Essential hypertension, benign 12/08/2007  . DIASTASIS RECTI 12/08/2007  . Pure hypercholesterolemia 06/26/2006  . ANEMIA, OTHER, UNSPECIFIED 06/26/2006  . Peripheral autonomic neuropathy due to diabetes mellitus (HCC) 06/26/2006  . GASTROESOPHAGEAL REFLUX DISEASE, CHRONIC 06/26/2006  . Enlarged prostate with lower urinary tract symptoms (LUTS) 06/26/2006    Sallyanne KusterKathy Duana Benedict, PTA, [facility] [address], [address] [place], [place] [address] [phone] 06/19/16, 4:02 PM  Name: Tanner Mason MRN: [id number] Date of Birth: October 04, 1940

## 2016-06-20 ENCOUNTER — Encounter: Payer: Self-pay | Admitting: Physical Therapy

## 2016-06-20 ENCOUNTER — Telehealth: Payer: Self-pay | Admitting: *Deleted

## 2016-06-20 ENCOUNTER — Ambulatory Visit: Payer: Medicare Other | Admitting: Physical Therapy

## 2016-06-20 DIAGNOSIS — M6281 Muscle weakness (generalized): Secondary | ICD-10-CM

## 2016-06-20 DIAGNOSIS — R2689 Other abnormalities of gait and mobility: Secondary | ICD-10-CM | POA: Diagnosis not present

## 2016-06-20 DIAGNOSIS — R2681 Unsteadiness on feet: Secondary | ICD-10-CM

## 2016-06-20 DIAGNOSIS — R278 Other lack of coordination: Secondary | ICD-10-CM

## 2016-06-20 DIAGNOSIS — R208 Other disturbances of skin sensation: Secondary | ICD-10-CM

## 2016-06-20 NOTE — Telephone Encounter (Signed)
-----   Message from Glendale Chardonika K Patel, DO sent at 06/20/2016  8:46 AM EST ----- Please inform patient that all his labs returned normal.  Does it want to proceed with spinal tap, like we discussed in the office?  Thanks.

## 2016-06-20 NOTE — Telephone Encounter (Signed)
I called patient and informed him that his labs came back normal and asked if he would like to have the LP done.  He said that he would like to hold off on that for now but will call if he decides to have it done.

## 2016-06-20 NOTE — Therapy (Signed)
[facility] [address] [place], [place], [address] Phone: [phone]   Fax:  [phone]  Physical Therapy Treatment  Patient Details  Name: Tanner Mason MRN: [id number] Date of Birth: Apr 06, 1941 Referring Provider: Glendale Chard  Encounter Date: 06/20/2016      PT End of Session - 06/20/16 1113    Visit Number 5   Number of Visits 8   Date for PT Re-Evaluation 07/03/16   PT Start Time 1106   PT Stop Time 1146   PT Time Calculation (min) 40 min   Equipment Utilized During Treatment Gait belt   Activity Tolerance Patient tolerated treatment well   Behavior During Therapy [facility] for tasks assessed/performed      Past Medical History:  Diagnosis Date  . Abdominal ultrasound, abnormal 02/2004   fatty liver  . Cellulitis and abscess 07/14/2015  . Diabetes mellitus without complication (HCC)   . Elbow dislocation 4034,7425   Right  . Encounter for diagnostic endoscopy 10/22/04   negative  . History of esophagogastroduodenoscopy 08/13/2007   normal  . Treadmill stress test negative for angina pectoris 06/2002   poor HR and BP recovery    Past Surgical History:  Procedure Laterality Date  . ANTERIOR CRUCIATE LIGAMENT REPAIR  5/98   Left, staph infection complicated  . BASAL CELL CARCINOMA EXCISION  02/2005   R ear  . BLEPHAROPLASTY  03/2005   with ptosis repair  . INGUINAL HERNIA REPAIR Right 1947  . INGUINAL HERNIA REPAIR Left 1970    There were no vitals filed for this visit.      Subjective Assessment - 06/20/16 1111    Subjective No new complaints. Did get his lab results that came back okay, so not getting the spinal tap done at this time. Denies any falls. Did tweak back with HEP, sore today.    Pertinent History uncontrolled DM with neuropathy, L foot drop, GBS, spinal stenosis   Diagnostic tests EMG/NCV: neuropathy   Patient Stated Goals improve balance and strength, open to bracing   Currently in Pain? Yes   Pain Score 2    Pain Location Back   Pain Orientation Lower   Pain Descriptors / Indicators Aching;Sore   Pain Type Chronic pain   Pain Onset More than a month ago   Pain Frequency Intermittent   Aggravating Factors  poor posture with HEP   Pain Relieving Factors rest, tylenol/pain medication as needed           Balance Exercises - 06/20/16 1143      Balance Exercises: Standing   SLS with Vectors Foam/compliant surface;Other reps (comment);Limitations   Rockerboard Anterior/posterior;Lateral;Head turns;EO;20 seconds;10 reps  large balance board   Balance Beam blue foam beam in parallel bars: alternating fwd heel taps to floor and back onto beam x 10 each leg, alternating bwd toe taps to floor and back onto beam x 10 reps each leg with min guard to min assist and light UE support on parallel bars                        Other Standing Exercises on blue airex with foam bubbles in semicircle around airex: toe touch to each one and back in, alternating legs, x 3 laps each leg. min guard to min assist for balance with cues on posture, stance position for balance and weight shifting.      Balance Exercises: Standing   SLS with Vectors Limitations 2  tall cones on blue mat: alternating fwd toe taps, alternating cross toe taps, alternating fwd double toe taps and alternating cross double toe taps x 10 each bil legs with min assist for balance and cues for posture, stance position and weight shifting.                                             Rebounder Limitations on large balance board: performed both ways on board with no UE support to light touch for balance, EC no head movements progressing to [facility] with head movements up<>down and left<>right. min guard to min assist for balance.                                                  PT Long Term Goals - 06/05/16 1459      PT LONG TERM GOAL #1   Title independent with HEP (Target Date: 07/03/16)   Status New     PT  LONG TERM GOAL #2   Title improve BERG balance score to >/= 52/56 for improved balance (Target Date: 07/03/16)   Status New     PT LONG TERM GOAL #3   Title improve dynamic gait index to >/= 23/24 for improved functional mobiltiy (Target Date: 07/03/16)   Status New     PT LONG TERM GOAL #4   Title ambulate > 1000' on various indoor/outdoor surfaces independently (with foot up) with improved foot clearance and foot slap for improved functional mobility (Target Date: 07/03/16)   Status New     PT LONG TERM GOAL #5   Title n/a            Plan - 06/20/16 1114    Clinical Impression Statement Today's skilled session continued to focus on ankle strengthening/stability and on high level balance activities with only a mild increase in back discomfort reported, up to a 3/10 from a 2/10. Pt plans to go home and use heat with tylenol to help his back pain. Pt is making steady progress toward goals. Pt should benefit from continued PT to progress toward unmet goals.    Rehab Potential Good   PT Treatment/Interventions ADLs/Self Care Home Management;Neuromuscular re-education;Balance training;Therapeutic exercise;Therapeutic activities;Functional mobility training;Gait training;Orthotic Fit/Training;Patient/family education;Vestibular   PT Next Visit Plan ankle strengthening, gastroc stretching, gait with foot up, corner and dynamic balance on complaint surfaces   Consulted and Agree with Plan of Care Patient      Patient will benefit from skilled therapeutic intervention in order to improve the following deficits and impairments:  Abnormal gait, Decreased balance, Decreased mobility, Difficulty walking, Decreased strength  Visit Diagnosis: Other abnormalities of gait and mobility  Muscle weakness (generalized)  Unsteadiness on feet  Other lack of coordination  Other disturbances of skin sensation     Problem List Patient Active Problem List   Diagnosis Date Noted  . Viral illness  05/23/2016  . Constipation   . Thrombocytopenia (HCC)   . Normocytic anemia   . Back pain   . Hypomagnesemia   . Type 2 diabetes mellitus with diabetic neuropathy, unspecified (HCC)   . Guillain Barr syndrome (HCC)   . Weakness 09/17/2015  . Gait abnormality 09/17/2015  . Gait instability 09/17/2015  . Preventative health  care 11/08/2014  . Angioedema 07/13/2014  . Left foot drop 04/05/2014  . Lumbar back pain with radiculopathy affecting left lower extremity 04/05/2014  . Atypical chest pain 12/14/2013  . Overweight (BMI 25.0-29.9) 09/15/2012  . Stage 3 chronic renal impairment associated with type 2 diabetes mellitus (HCC) 07/20/2010  . ULNAR NERVE ENTRAPMENT, RIGHT 06/07/2008  . Diabetes mellitus (HCC) 12/09/2007  . Essential hypertension, benign 12/08/2007  . DIASTASIS RECTI 12/08/2007  . Pure hypercholesterolemia 06/26/2006  . ANEMIA, OTHER, UNSPECIFIED 06/26/2006  . Peripheral autonomic neuropathy due to diabetes mellitus (HCC) 06/26/2006  . GASTROESOPHAGEAL REFLUX DISEASE, CHRONIC 06/26/2006  . Enlarged prostate with lower urinary tract symptoms (LUTS) 06/26/2006    Sallyanne Kuster, PTA, [facility] Outpatient Neuro [facility] [address] [place], [place] [address] [phone] 06/20/16, 10:58 PM   Name: Tanner Mason MRN: [id number] Date of Birth: 07-05-1940

## 2016-06-25 ENCOUNTER — Encounter: Payer: Self-pay | Admitting: Physical Therapy

## 2016-06-25 ENCOUNTER — Ambulatory Visit: Payer: Medicare Other | Admitting: Physical Therapy

## 2016-06-25 DIAGNOSIS — M6281 Muscle weakness (generalized): Secondary | ICD-10-CM

## 2016-06-25 DIAGNOSIS — R208 Other disturbances of skin sensation: Secondary | ICD-10-CM

## 2016-06-25 DIAGNOSIS — R278 Other lack of coordination: Secondary | ICD-10-CM

## 2016-06-25 DIAGNOSIS — R2681 Unsteadiness on feet: Secondary | ICD-10-CM

## 2016-06-25 DIAGNOSIS — R2689 Other abnormalities of gait and mobility: Secondary | ICD-10-CM | POA: Diagnosis not present

## 2016-06-26 NOTE — Therapy (Signed)
[facility] [address] [place], [place], [address] Phone: [phone]   Fax:  [phone]  Physical Therapy Treatment  Patient Details  Name: Tanner RueCharles A Spirito MRN: [id number] Date of Birth: 1941-02-01 Referring Provider: Glendale ChardPATEL, DONIKA K  Encounter Date: 06/25/2016      PT End of Session - 06/25/16 1237    Visit Number 6   Number of Visits 8   Date for PT Re-Evaluation 07/03/16   PT Start Time 1232   PT Stop Time 1314   PT Time Calculation (min) 42 min   Equipment Utilized During Treatment Gait belt   Activity Tolerance Patient tolerated treatment well   Behavior During Therapy [facility] for tasks assessed/performed      Past Medical History:  Diagnosis Date  . Abdominal ultrasound, abnormal 02/2004   fatty liver  . Cellulitis and abscess 07/14/2015  . Diabetes mellitus without complication (HCC)   . Elbow dislocation [id number]   Right  . Encounter for diagnostic endoscopy 10/22/04   negative  . History of esophagogastroduodenoscopy 08/13/2007   normal  . Treadmill stress test negative for angina pectoris 06/2002   poor HR and BP recovery    Past Surgical History:  Procedure Laterality Date  . ANTERIOR CRUCIATE LIGAMENT REPAIR  5/98   Left, staph infection complicated  . BASAL CELL CARCINOMA EXCISION  02/2005   R ear  . BLEPHAROPLASTY  03/2005   with ptosis repair  . INGUINAL HERNIA REPAIR Right 1947  . INGUINAL HERNIA REPAIR Left 1970    There were no vitals filed for this visit.      Subjective Assessment - 06/25/16 1236    Subjective No new complaints. HEP going well. Still using foot up brace without issues.    Pertinent History uncontrolled DM with neuropathy, L foot drop, GBS, spinal stenosis   Diagnostic tests EMG/NCV: neuropathy   Patient Stated Goals improve balance and strength, open to bracing   Currently in Pain? No/denies   Pain Score 0-No pain            [facility] Adult PT  Treatment/Exercise - 06/25/16 1237      Transfers   Transfers --     Ambulation/Gait   Ambulation/Gait Yes   Ambulation/Gait Assistance 6: Modified independent (Device/Increase time)   Ambulation Distance (Feet) 1000 Feet   Assistive device None   Gait Pattern Step-through pattern;Poor foot clearance - right   Ambulation Surface Level;Unlevel;Indoor;Outdoor;Paved;Gravel;Grass   Gait Comments right toe scuffing x 2-3 episodes with last ~100 feet of gait, no balance issues noted.  cues on posture (scapular retraction and fwd gaze with head)     High Level Balance   High Level Balance Activities Marching forwards;Marching backwards;Tandem walking   High Level Balance Comments on red/blue mats in gym, no UE support: 3 laps each with min to mod assist for balance     Neuro Re-ed    Neuro Re-ed Details  in parallel bars: standing on inverted bosu ball- rocking fwd/bwd and laterally x 10 reps each with emphasis on tall posture, mini squats x 10 reps, all with min guard to min assist for balance and no UE support. on blue mat over ramp: performed facing both ways with feet together- fwd stepping with weight shifting and then stepping back into place. x 10 each side, most difficulty was with facing down ramp, EC no head movements 20 sec's x 3 reps. min guard to min assist for balance with cues on posture and weight  shifting.                PT Long Term Goals - 06/05/16 1459      PT LONG TERM GOAL #1   Title independent with HEP (Target Date: 07/03/16)   Status New     PT LONG TERM GOAL #2   Title improve BERG balance score to >/= 52/56 for improved balance (Target Date: 07/03/16)   Status New     PT LONG TERM GOAL #3   Title improve dynamic gait index to >/= 23/24 for improved functional mobiltiy (Target Date: 07/03/16)   Status New     PT LONG TERM GOAL #4   Title ambulate > 1000' on various indoor/outdoor surfaces independently (with foot up) with improved foot clearance and foot slap for  improved functional mobility (Target Date: 07/03/16)   Status New     PT LONG TERM GOAL #5   Title n/a             Plan - 06/25/16 1237    Clinical Impression Statement today's skilled session continued to focus on ankle strengthening and higher level balance activities with emphasis on complaint surfaces. Mild increase in back pain with some balance activities that resolved with rest breaks with no pain after session. Pt is making steady progress toward goals and should benefit from continued PT to progress toward unmet goals.    Rehab Potential Good   PT Treatment/Interventions ADLs/Self Care Home Management;Neuromuscular re-education;Balance training;Therapeutic exercise;Therapeutic activities;Functional mobility training;Gait training;Orthotic Fit/Training;Patient/family education;Vestibular   PT Next Visit Plan ankle strengthening, gastroc stretching, gait with foot up, corner and dynamic balance on complaint surfaces   Consulted and Agree with Plan of Care Patient      Patient will benefit from skilled therapeutic intervention in order to improve the following deficits and impairments:  Abnormal gait, Decreased balance, Decreased mobility, Difficulty walking, Decreased strength  Visit Diagnosis: Other abnormalities of gait and mobility  Muscle weakness (generalized)  Unsteadiness on feet  Other disturbances of skin sensation  Other lack of coordination     Problem List Patient Active Problem List   Diagnosis Date Noted  . Viral illness 05/23/2016  . Constipation   . Thrombocytopenia (HCC)   . Normocytic anemia   . Back pain   . Hypomagnesemia   . Type 2 diabetes mellitus with diabetic neuropathy, unspecified (HCC)   . Guillain Barr syndrome (HCC)   . Weakness 09/17/2015  . Gait abnormality 09/17/2015  . Gait instability 09/17/2015  . Preventative health care 11/08/2014  . Angioedema 07/13/2014  . Left foot drop 04/05/2014  . Lumbar back pain with  radiculopathy affecting left lower extremity 04/05/2014  . Atypical chest pain 12/14/2013  . Overweight (BMI 25.0-29.9) 09/15/2012  . Stage 3 chronic renal impairment associated with type 2 diabetes mellitus (HCC) 07/20/2010  . ULNAR NERVE ENTRAPMENT, RIGHT 06/07/2008  . Diabetes mellitus (HCC) 12/09/2007  . Essential hypertension, benign 12/08/2007  . DIASTASIS RECTI 12/08/2007  . Pure hypercholesterolemia 06/26/2006  . ANEMIA, OTHER, UNSPECIFIED 06/26/2006  . Peripheral autonomic neuropathy due to diabetes mellitus (HCC) 06/26/2006  . GASTROESOPHAGEAL REFLUX DISEASE, CHRONIC 06/26/2006  . Enlarged prostate with lower urinary tract symptoms (LUTS) 06/26/2006    Sallyanne Kuster, PTA, [facility] Outpatient Neuro [facility] [address], [address] [place], [place] [address] [phone] 06/26/16, 5:12 PM   Name: SWAIN ACREE MRN: [id number] Date of Birth: 26-Feb-1941

## 2016-06-27 ENCOUNTER — Encounter: Payer: Self-pay | Admitting: Rehabilitation

## 2016-06-27 ENCOUNTER — Ambulatory Visit: Payer: Medicare Other | Attending: Neurology | Admitting: Rehabilitation

## 2016-06-27 ENCOUNTER — Ambulatory Visit: Payer: Medicare Other | Admitting: Neurology

## 2016-06-27 DIAGNOSIS — M6281 Muscle weakness (generalized): Secondary | ICD-10-CM

## 2016-06-27 DIAGNOSIS — R2689 Other abnormalities of gait and mobility: Secondary | ICD-10-CM | POA: Diagnosis not present

## 2016-06-27 DIAGNOSIS — R208 Other disturbances of skin sensation: Secondary | ICD-10-CM | POA: Diagnosis present

## 2016-06-27 DIAGNOSIS — R2681 Unsteadiness on feet: Secondary | ICD-10-CM | POA: Insufficient documentation

## 2016-06-27 NOTE — Therapy (Signed)
[facility] [address] [place], [place], [address] Phone: [phone]   Fax:  [phone]  Physical Therapy Treatment  Patient Details  Name: Tanner RueCharles A Mason MRN: [id number] Date of Birth: Aug 28, 1940 Referring Provider: [place]endale ChardPATEL, DONIKA K  Encounter Date: 06/27/2016      PT End of Session - 06/27/16 1911    Visit Number 7   Number of Visits 8   Date for PT Re-Evaluation 07/03/16   PT Start Time 1447   PT Stop Time 1530   PT Time Calculation (min) 43 min   Equipment Utilized During Treatment Gait belt   Activity Tolerance Patient tolerated treatment well   Behavior [facility] Therapy [facility] for tasks assessed/performed      Past Medical History:  Diagnosis Date  . Abdominal ultrasound, abnormal 02/2004   fatty liver  . Cellulitis and abscess 07/14/2015  . Diabetes mellitus without complication (HCC)   . Elbow dislocation [id number]   Right  . Encounter for diagnostic endoscopy 10/22/04   negative  . History of esophagogastroduodenoscopy 08/13/2007   normal  . Treadmill stress test negative for angina pectoris 06/2002   poor HR and BP recovery    Past Surgical History:  Procedure Laterality Date  . ANTERIOR CRUCIATE LIGAMENT REPAIR  5/98   Left, staph infection complicated  . BASAL CELL CARCINOMA EXCISION  02/2005   R ear  . BLEPHAROPLASTY  03/2005   with ptosis repair  . INGUINAL HERNIA REPAIR Right 1947  . INGUINAL HERNIA REPAIR Left 1970    There were no vitals filed for this visit.      Subjective Assessment - 06/27/16 1454    Subjective No new compliants, no falls to report.     Pertinent History uncontrolled DM with neuropathy, L foot drop, GBS, spinal stenosis   Diagnostic tests EMG/NCV: neuropathy   Patient Stated Goals improve balance and strength, open to bracing   Currently in Pain? No/denies                         [facility] Adult PT Treatment/Exercise - 06/27/16 0001      Neuro Re-ed    Neuro Re-ed Details  Session focused on high level balance activities; walking on heels x 2 reps along counter, walking on toes along counter x 2 reps (forward and backward for both), walking tandem x 2 reps forward/backward, braiding x 20' x 2 reps, agility ladder quick stepping on toes forwards x 2 reps, laterally x 2 reps, and combo of forward/lateral x 2 reps, in // bars had pt perform SLS x 1 rep each side x 30 secs each, keeping one LE in stance on blue side of BOSU ball while advancing opposite LE forward and backward with intermittent UE support as needed x 10 reps on each side, standing on half foam roll x 20 secs maintaining balance, standing on trampoline performing SLS x 10-15 secs with light UE support.       Exercises   Exercises Other Exercises   Other Exercises  Ankle stengthening while on half foam roller moving forwards/backwards with slow control x 10 reps, standing on BAPS board moving clockwise and counter clockwise x 5 reps each with emphasis on control.  Plantar flexion strength seated on EOM elevating off of mat with LLE behind him on toes to focus on PF strength x 5 reps on each side.  ended with mini jump squats on trampoline x 10 reps.  PT Education - 06/27/16 1454    Education provided Yes   Education Details compliance with HEP   Person(s) Educated Patient   Methods Explanation   Comprehension Verbalized understanding             PT Long Term Goals - 06/05/16 1459      PT LONG TERM GOAL #1   Title independent with HEP (Target Date: 07/03/16)   Status New     PT LONG TERM GOAL #2   Title improve BERG balance score to >/= 52/56 for improved balance (Target Date: 07/03/16)   Status New     PT LONG TERM GOAL #3   Title improve dynamic gait index to >/= 23/24 for improved functional mobiltiy (Target Date: 07/03/16)   Status New     PT LONG TERM GOAL #4   Title ambulate > 1000' on various indoor/outdoor surfaces  independently (with foot up) with improved foot clearance and foot slap for improved functional mobility (Target Date: 07/03/16)   Status New     PT LONG TERM GOAL #5   Title n/a               Plan - 06/27/16 1912    Clinical Impression Statement Session continues to focus on high level balance, balance strategies, and ankle strengthening.    Tolerated all well and will be ready for D/C next week.    Rehab Potential Good   PT Treatment/Interventions ADLs/Self Care Home Management;Neuromuscular re-education;Balance training;Therapeutic exercise;Therapeutic activities;Functional mobility training;Gait training;Orthotic Fit/Training;Patient/family education;Vestibular   PT Next Visit Plan LTGs and D/C   Consulted and Agree with Plan of Care Patient      Patient will benefit from skilled therapeutic intervention in order to improve the following deficits and impairments:  Abnormal gait, Decreased balance, Decreased mobility, Difficulty walking, Decreased strength  Visit Diagnosis: Other abnormalities of gait and mobility  Muscle weakness (generalized)  Unsteadiness on feet     Problem List Patient Active Problem List   Diagnosis Date Noted  . Viral illness 05/23/2016  . Constipation   . Thrombocytopenia (HCC)   . Normocytic anemia   . Back pain   . Hypomagnesemia   . Type 2 diabetes mellitus with diabetic neuropathy, unspecified (HCC)   . Guillain Barr syndrome (HCC)   . Weakness 09/17/2015  . Gait abnormality 09/17/2015  . Gait instability 09/17/2015  . Preventative health care 11/08/2014  . Angioedema 07/13/2014  . Left foot drop 04/05/2014  . Lumbar back pain with radiculopathy affecting left lower extremity 04/05/2014  . Atypical chest pain 12/14/2013  . Overweight (BMI 25.0-29.9) 09/15/2012  . Stage 3 chronic renal impairment associated with type 2 diabetes mellitus (HCC) 07/20/2010  . ULNAR NERVE ENTRAPMENT, RIGHT 06/07/2008  . Diabetes mellitus (HCC)  12/09/2007  . Essential hypertension, benign 12/08/2007  . DIASTASIS RECTI 12/08/2007  . Pure hypercholesterolemia 06/26/2006  . ANEMIA, OTHER, UNSPECIFIED 06/26/2006  . Peripheral autonomic neuropathy due to diabetes mellitus (HCC) 06/26/2006  . GASTROESOPHAGEAL REFLUX DISEASE, CHRONIC 06/26/2006  . Enlarged prostate with lower urinary tract symptoms (LUTS) 06/26/2006    Harriet Butte, PT, MPT [facility] [address] [place], [place], [address] Phone: [phone]   Fax:  [phone] 06/27/16, 7:14 PM  Name: Tanner Mason MRN: [id number] Date of Birth: 27-Jan-1941

## 2016-07-01 ENCOUNTER — Ambulatory Visit: Payer: Medicare Other | Admitting: Rehabilitation

## 2016-07-01 ENCOUNTER — Encounter: Payer: Self-pay | Admitting: Rehabilitation

## 2016-07-01 DIAGNOSIS — M6281 Muscle weakness (generalized): Secondary | ICD-10-CM

## 2016-07-01 DIAGNOSIS — R208 Other disturbances of skin sensation: Secondary | ICD-10-CM

## 2016-07-01 DIAGNOSIS — R2689 Other abnormalities of gait and mobility: Secondary | ICD-10-CM

## 2016-07-01 DIAGNOSIS — R2681 Unsteadiness on feet: Secondary | ICD-10-CM

## 2016-07-01 NOTE — Therapy (Signed)
[facility] [address] [place], [place], [address] Phone: [phone]   Fax:  [phone]  Physical Therapy Treatment and D/C Summary  Patient Details  Name: Tanner Mason MRN: [id number] Date of Birth: 1940/10/09 Referring Provider: Alda Berthold  Encounter Date: 07/01/2016      PT End of Session - 07/01/16 1353    Visit Number 8   Number of Visits 8   Date for PT Re-Evaluation 07/03/16   PT Start Time 5498   PT Stop Time 2641  did not need whole time due to D/C   PT Time Calculation (min) 40 min   Equipment Utilized During Treatment Gait belt   Activity Tolerance Patient tolerated treatment well   Behavior During Therapy [facility] for tasks assessed/performed      Past Medical History:  Diagnosis Date  . Abdominal ultrasound, abnormal 02/2004   fatty liver  . Cellulitis and abscess 07/14/2015  . Diabetes mellitus without complication ([place])   . Elbow dislocation [id number]   Right  . Encounter for diagnostic endoscopy 10/22/04   negative  . History of esophagogastroduodenoscopy 08/13/2007   normal  . Treadmill stress test negative for angina pectoris 06/2002   poor HR and BP recovery    Past Surgical History:  Procedure Laterality Date  . ANTERIOR CRUCIATE LIGAMENT REPAIR  5/98   Left, staph infection complicated  . BASAL CELL CARCINOMA EXCISION  02/2005   R ear  . BLEPHAROPLASTY  03/2005   with ptosis repair  . INGUINAL HERNIA REPAIR Right 1947  . INGUINAL HERNIA REPAIR Left 1970    There were no vitals filed for this visit.      Subjective Assessment - 07/01/16 1320    Subjective No new things to reports, no falls.    Pertinent History uncontrolled DM with neuropathy, L foot drop, GBS, spinal stenosis   Diagnostic tests EMG/NCV: neuropathy   Patient Stated Goals improve balance and strength, open to bracing   Currently in Pain? No/denies                         [facility]  Adult PT Treatment/Exercise - 07/01/16 1331      Ambulation/Gait   Ambulation/Gait Yes   Ambulation/Gait Assistance 7: Independent   Ambulation Distance (Feet) 1500 Feet   Assistive device None   Gait Pattern Step-through pattern;Poor foot clearance - right  no foot up brace today   Ambulation Surface Level;Indoor;Unlevel;Outdoor;Gravel;Paved;Grass     Standardized Balance Assessment   Standardized Balance Assessment Berg Balance Test;Dynamic Gait Index     Berg Balance Test   Sit to Stand Able to stand without using hands and stabilize independently   Standing Unsupported Able to stand safely 2 minutes   Sitting with Back Unsupported but Feet Supported on Floor or Stool Able to sit safely and securely 2 minutes   Stand to Sit Sits safely with minimal use of hands   Transfers Able to transfer safely, minor use of hands   Standing Unsupported with Eyes Closed Able to stand 10 seconds safely   Standing Ubsupported with Feet Together Able to place feet together independently and stand 1 minute safely   From Standing, Reach Forward with Outstretched Arm Can reach confidently >25 cm (10")   From Standing Position, Pick up Object from Floor Able to pick up shoe safely and easily   From Standing Position, Turn to Look Behind Over each Shoulder Looks behind from both sides  and weight shifts well   Turn 360 Degrees Able to turn 360 degrees safely in 4 seconds or less   Standing Unsupported, Alternately Place Feet on Step/Stool Able to stand independently and safely and complete 8 steps in 20 seconds   Standing Unsupported, One Foot in Front Able to plae foot ahead of the other independently and hold 30 seconds   Standing on One Leg Able to lift leg independently and hold equal to or more than 3 seconds   Total Score 53     Dynamic Gait Index   Level Surface Normal   Change in Gait Speed Normal   Gait with Horizontal Head Turns Normal   Gait with Vertical Head Turns Normal   Gait and Pivot  Turn Normal   Step Over Obstacle Normal   Step Around Obstacles Normal   Steps Normal   Total Score 24     Self-Care   Self-Care Other Self-Care Comments   Other Self-Care Comments  Briefly discussed HEP in which pt able to verbalize all current exercises.                  PT Education - July 08, 2016 1353    Education provided Yes   Education Details maintaining community fitness   Person(s) Educated Patient   Methods Explanation   Comprehension Verbalized understanding             PT Long Term Goals - 07/08/2016 1320      PT LONG TERM GOAL #1   Title independent with HEP (Target Date: 07/03/16)   Baseline met per verbal reports Jul 08, 2016   Status Achieved     PT LONG TERM GOAL #2   Title improve BERG balance score to >/= 52/56 for improved balance (Target Date: 07/03/16)   Baseline 53/56 on 07-08-16   Status Achieved     PT LONG TERM GOAL #3   Title improve dynamic gait index to >/= 23/24 for improved functional mobiltiy (Target Date: 07/03/16)   Baseline 24/24 on Jul 08, 2016   Status Achieved     PT LONG TERM GOAL #4   Title ambulate > 1000' on various indoor/outdoor surfaces independently (with foot up) with improved foot clearance and foot slap for improved functional mobility (Target Date: 07/03/16)   Baseline met July 08, 2016   Status Achieved     PT LONG TERM GOAL #5   Title n/a               Plan - 08-Jul-2016 1349    Clinical Impression Statement Skilled session focused on addressing LTGs.  Pt has met 4/4 LTGs indicating improved balance, strength and endurance as well as ability to return to leisure and community activities.  Note that he was not wearing foot up brace today, however did not demonstrate any toe drag, catch today.     Rehab Potential Good   PT Treatment/Interventions ADLs/Self Care Home Management;Neuromuscular re-education;Balance training;Therapeutic exercise;Therapeutic activities;Functional mobility training;Gait training;Orthotic  Fit/Training;Patient/family education;Vestibular   Consulted and Agree with Plan of Care Patient      Patient will benefit from skilled therapeutic intervention in order to improve the following deficits and impairments:  Abnormal gait, Decreased balance, Decreased mobility, Difficulty walking, Decreased strength  Visit Diagnosis: Other abnormalities of gait and mobility  Muscle weakness (generalized)  Unsteadiness on feet  Other disturbances of skin sensation       G-Codes - 07-08-16 1354    Functional Assessment Tool Used (Outpatient Only) DGI: 24/24   Functional Limitation Mobility: Walking  and moving around   Mobility: Walking and Moving Around Current Status [phone]) 0 percent impaired, limited or restricted   Mobility: Walking and Moving Around Goal Status [phone]) At least 1 percent but less than 20 percent impaired, limited or restricted   Mobility: Walking and Moving Around Discharge Status [phone]) 0 percent impaired, limited or restricted      PHYSICAL THERAPY DISCHARGE SUMMARY  Visits from Start of Care: 8  Current functional level related to goals / functional outcomes: See above   Remaining deficits: Pt with very high level balance deficits related to L ankle strength however has improved greatly with exercises.    Education / Equipment: HEP  Plan: Patient agrees to discharge.  Patient goals were met. Patient is being discharged due to meeting the stated rehab goals.  ?????        Problem List Patient Active Problem List   Diagnosis Date Noted  . Viral illness 05/23/2016  . Constipation   . Thrombocytopenia ([place])   . Normocytic anemia   . Back pain   . Hypomagnesemia   . Type 2 diabetes mellitus with diabetic neuropathy, unspecified ([place])   . Guillain Barr syndrome ([place])   . Weakness 09/17/2015  . Gait abnormality 09/17/2015  . Gait instability 09/17/2015  . Preventative health care 11/08/2014  . Angioedema 07/13/2014  . Left foot drop  04/05/2014  . Lumbar back pain with radiculopathy affecting left lower extremity 04/05/2014  . Atypical chest pain 12/14/2013  . Overweight (BMI 25.0-29.9) 09/15/2012  . Stage 3 chronic renal impairment associated with type 2 diabetes mellitus ([place]) 07/20/2010  . ULNAR NERVE ENTRAPMENT, RIGHT 06/07/2008  . Diabetes mellitus ([place]) 12/09/2007  . Essential hypertension, benign 12/08/2007  . DIASTASIS RECTI 12/08/2007  . Pure hypercholesterolemia 06/26/2006  . ANEMIA, OTHER, UNSPECIFIED 06/26/2006  . Peripheral autonomic neuropathy due to diabetes mellitus ([place]) 06/26/2006  . GASTROESOPHAGEAL REFLUX DISEASE, CHRONIC 06/26/2006  . Enlarged prostate with lower urinary tract symptoms (LUTS) 06/26/2006    Cameron Sprang, PT, MPT [facility] [address] [place], [place], [address] Phone: [phone]   Fax:  [phone] 07/01/16, 2:01 PM  Name: DEMONTREZ RINDFLEISCH MRN: [id number] Date of Birth: 01-Mar-1941

## 2016-07-03 ENCOUNTER — Other Ambulatory Visit: Payer: Self-pay | Admitting: Family Medicine

## 2016-07-03 ENCOUNTER — Ambulatory Visit: Payer: Medicare Other | Admitting: Physical Therapy

## 2016-07-03 ENCOUNTER — Encounter: Payer: Self-pay | Admitting: Family Medicine

## 2016-07-03 NOTE — Progress Notes (Signed)
   Subjective:    Patient ID: Tanner Mason, male    DOB: 1940-11-02, 76 y.o.   MRN: [id number]  HPI 76 y/o male presents for routine follow up.  Type 2 DM Currently prescribed Lantus 40 units daily, Victoza 1.8 mg daily, and Humalog 5 units TID. (Taking 14 units Humalog before meals, sometimes after, does occasionally miss doses before meal but tries to take afterwards) Denies hypoglycemic symptoms.   Fasting AM -sometimes in the 200's, mostly in 100's Before Dinner - anywhere from 89 to low 200's  Peripheral Neuropathy/Hx. Guillain Barre Syndrome Follows with neurology (seen in February, reviewed note), taking Gabapentin 900 mg TID, considering LP to determine transition to CIDP. Numbness is stable, not progressing, patient does not want LP at this time, no burning/pain at this time, mostly numbness   Anemia Not taking iron   Enlarged Prostate with LUTS Currently taking Flomax and Proscar, no issues with urination, no trouble voiding, no stopping and starting  HM Up to date   Review of Systems  Constitutional: Negative for chills, fatigue and fever.  Respiratory: Negative for choking and shortness of breath.   Cardiovascular: Negative for chest pain.  Gastrointestinal: Negative for diarrhea, nausea and vomiting.       Objective:   Physical Exam BP 120/62   Pulse 81   Temp 97.5 F (36.4 C) (Oral)   Ht 5\' 7"  (1.702 m)   Wt 165 lb (74.8 kg)   SpO2 98%   BMI 25.84 kg/m   Gen: pleasant male, NAD Cardiac: RRR, S1 and S2 present, no murmur Resp: CTAB, normal effort   PSA 04/2013 0.98 POC A1C 6.2    Assessment & Plan:  Diabetes mellitus (HCC) Well controlled. A1C 6.2. Discrepancy between A1C and some of home values. -continue Lantus 40 units daily and Victoza 1.8 mg daily -change Humalog to 8 units with meals and 10 with largest meal of day (this is a decrease from 14 units with meals, but increase from previous prescription) -patient to check home blood sugars  and send via MyChart -up to date on eye and foot exams  Peripheral autonomic neuropathy due to diabetes mellitus (HCC) Stable symptoms from DM Neuropathy and Guillain Barre. Patient not interested in LP at this time to evaluate for CIDP.  -continue Gabapentin   Enlarged prostate with lower urinary tract symptoms (LUTS) Stable symptoms. Check PSA with next lab check.

## 2016-07-05 ENCOUNTER — Encounter: Payer: Self-pay | Admitting: Family Medicine

## 2016-07-05 ENCOUNTER — Ambulatory Visit (INDEPENDENT_AMBULATORY_CARE_PROVIDER_SITE_OTHER): Payer: Medicare Other | Admitting: Family Medicine

## 2016-07-05 VITALS — BP 120/62 | HR 81 | Temp 97.5°F | Ht 67.0 in | Wt 165.0 lb

## 2016-07-05 DIAGNOSIS — E1143 Type 2 diabetes mellitus with diabetic autonomic (poly)neuropathy: Secondary | ICD-10-CM

## 2016-07-05 DIAGNOSIS — Z794 Long term (current) use of insulin: Secondary | ICD-10-CM | POA: Diagnosis not present

## 2016-07-05 DIAGNOSIS — E0821 Diabetes mellitus due to underlying condition with diabetic nephropathy: Secondary | ICD-10-CM

## 2016-07-05 DIAGNOSIS — N401 Enlarged prostate with lower urinary tract symptoms: Secondary | ICD-10-CM | POA: Diagnosis not present

## 2016-07-05 LAB — POCT GLYCOSYLATED HEMOGLOBIN (HGB A1C): Hemoglobin A1C: 6.2

## 2016-07-05 NOTE — Patient Instructions (Addendum)
It was nice to see you today.  Diabetes Please continue Lantus 40 units - start taking at night Continue Victoza 1.8 mg Take 8 units of Humalog before meals (except for your biggest meal of the day take 10 units).   In two weeks send me a list of your blood sugars and I will adjust.    Check PSA, basic labs at next visit.

## 2016-07-05 NOTE — Assessment & Plan Note (Signed)
Well controlled. A1C 6.2. Discrepancy between A1C and some of home values. -continue Lantus 40 units daily and Victoza 1.8 mg daily -change Humalog to 8 units with meals and 10 with largest meal of day (this is a decrease from 14 units with meals, but increase from previous prescription) -patient to check home blood sugars and send via MyChart -up to date on eye and foot exams

## 2016-07-05 NOTE — Assessment & Plan Note (Signed)
Stable symptoms. Check PSA with next lab check.

## 2016-07-05 NOTE — Assessment & Plan Note (Signed)
Stable symptoms from DM Neuropathy and Guillain Barre. Patient not interested in LP at this time to evaluate for CIDP.  -continue Gabapentin

## 2016-07-14 ENCOUNTER — Other Ambulatory Visit: Payer: Self-pay | Admitting: Family Medicine

## 2016-07-21 ENCOUNTER — Other Ambulatory Visit: Payer: Self-pay | Admitting: Family Medicine

## 2016-07-23 ENCOUNTER — Other Ambulatory Visit: Payer: Self-pay | Admitting: Family Medicine

## 2016-08-06 ENCOUNTER — Encounter: Payer: Self-pay | Admitting: Family Medicine

## 2016-08-08 ENCOUNTER — Other Ambulatory Visit: Payer: Self-pay | Admitting: Family Medicine

## 2016-08-08 MED ORDER — INSULIN GLARGINE 100 UNIT/ML SOLOSTAR PEN: 45.0000 [IU] | PEN_INJECTOR | Freq: Every day | SUBCUTANEOUS | 2 refills | Status: DC

## 2016-08-12 ENCOUNTER — Other Ambulatory Visit: Payer: Self-pay | Admitting: *Deleted

## 2016-08-13 MED ORDER — INSULIN GLARGINE 100 UNIT/ML SOLOSTAR PEN: 45.0000 [IU] | PEN_INJECTOR | Freq: Every day | SUBCUTANEOUS | 2 refills | Status: DC

## 2016-08-13 NOTE — Addendum Note (Signed)
Addended by: Clovis Pu on: 08/13/2016 03:38 PM   Modules accepted: Orders

## 2016-08-13 NOTE — Telephone Encounter (Signed)
Refill approved 08/12/16, but stated no print.  Rx refill resent electronically.  Clovis Pu, RN

## 2016-09-04 ENCOUNTER — Encounter: Payer: Self-pay | Admitting: Family Medicine

## 2016-09-10 ENCOUNTER — Other Ambulatory Visit: Payer: Self-pay | Admitting: Family Medicine

## 2016-09-11 ENCOUNTER — Other Ambulatory Visit: Payer: Self-pay | Admitting: Family Medicine

## 2016-09-11 MED ORDER — METOPROLOL TARTRATE 25 MG PO TABS: 75.0000 mg | ORAL_TABLET | Freq: Two times a day (BID) | ORAL | 5 refills | Status: DC

## 2016-09-11 NOTE — Progress Notes (Signed)
Pt informed. Takeyah Wieman, CMA  

## 2016-09-11 NOTE — Progress Notes (Signed)
RN staff - please call patient and inform him that I have changed this Metoprolol Tartrate 75 mg BID to Metoprolol Tartrate 25 mg mg (3 tablets in the AM and 3 tablets in the PM). The reason for doing this is that is pharmacy could not get the twice a day medication (out of stock).

## 2016-09-16 ENCOUNTER — Encounter: Payer: Self-pay | Admitting: Neurology

## 2016-09-16 ENCOUNTER — Ambulatory Visit (INDEPENDENT_AMBULATORY_CARE_PROVIDER_SITE_OTHER): Payer: Medicare Other | Admitting: Neurology

## 2016-09-16 VITALS — BP 110/68 | HR 87 | Ht 67.0 in | Wt 166.4 lb

## 2016-09-16 DIAGNOSIS — E0842 Diabetes mellitus due to underlying condition with diabetic polyneuropathy: Secondary | ICD-10-CM | POA: Diagnosis not present

## 2016-09-16 NOTE — Progress Notes (Signed)
Follow-up Visit   Date: 09/16/16    Tanner Mason MRN: [id number] DOB: August 05, 1940   Interim History: Tanner Mason is a 76 y.o. right-handed Caucasian male with insulin-dependent diabetes mellitus c/b renal insufficiency, hypertension, basal cell carcinoma of the ear s/p resection, hyperlipidemia, and Guillian-Barre syndrome (May 20180 returning to the clinic with complaints of worsening numbness of the legs.   History of present illness: He was visiting [place] at [facility] and he suddenly developed worsening tingling of the feet which began involving his lower leg.  He was evaluated at the ER in [place] where MRI brain and lumbar spine was performed.  The following day he came to [facility], he was unable to walk which prompted him to go to the [facility] ED.  By the time he arrived at the ED, his tingling and numbness involved the level of thigh.  Patient was hospitalized at [facility] from 5/21 - 09/26/2015 with progressive numbness of the feet, imbalance, and weakness.  His MRI lumbar spine showed mild-moderate stenosis at L4-5 and L5-S1.  Due to clinical exam showing arreflexia, there was a high suspicion for GBS, and his CSF testing was consistent with this showing albuminocytologic dissociation and elevated IgG index.  Of note, his CSF glucose was also elevated, but serum glucose on the same day was 280-363.  He completed 5 sessions of plasmapheresis. After the 3rd session, he began noticing improved paresthesias. He developed dysautonomia manifesting with orthostasis for which his blood pressure medications were adjusted.  He was discharged to rehab facility and went home on 6/12 with a walker and out-patient PT.   He has been doing out-patient physical therapy and noticed significant improvement and able to walk unassisted.  His balance is still a problem, and he has not had any falls.  His strength is improving and now his paresthesias are isolated to his feet,  which is where it used to be given his history of neuropathy.  He also has left foot weakness which has been present for at least the past 4 years.  He was told that he may need back surgery for this some time back, but decided to pursue it conservatively. Overall, he feels that is is very close to his baseline.    UPDATE 05/27/2016:  Over the past several months, he feels that his imbalance has been worsening and he has tightness/numbness to the level of his ankles, where as previous to his GBS, his neuropathy was restricted to his feet.  He denies any painful paresthesias, which has been well controlled on gabapentin 966m TID. He continues to walk unassisted and does not use a ankle brace, but has noticed that walking takes more conscientious effort.  He has not suffered any falls, but wife states he does stumble.  He has not noticed any new weakness.  His left foot continues to be weak (old).  UPDATE 06/14/2016:  Patient scheduled a sooner appointment because last week, he started having a tight sensation over his entire lower leg and ankle up to his knees. Symptoms are constant and he has not noticed anything to trigger it.   He denies tingling and burning.  He feels that his knees do not feel as stable, but is still walking independently without a cane.    UPDATE 09/16/2016:  Since his last visit, his neuropathy has remained stable and restricted to mostly the feet.  His labs returned normal and he did not wish to pursue repeat CSF testing  since symptoms stabilized.  He has no new complaints and is doing well.  No interval falls.  He denies any painful tingling or burning sensation and continues to take gabapentin 963m TID, but often skips the noon dose.    Medications:  Current Outpatient Prescriptions on File Prior to Visit  Medication Sig Dispense Refill  . amLODipine (NORVASC) 5 MG tablet TAKE 1 TABLET (5 MG TOTAL) BY MOUTH DAILY. 30 tablet 0  . aspirin (BAYER CHILDRENS ASPIRIN) 81 MG chewable  tablet Chew 81 mg by mouth daily.      .Marland Kitchenatorvastatin (LIPITOR) 40 MG tablet TAKE ONE TABLET BY MOUTH ONE TIME DAILY 90 tablet 1  . BD PEN NEEDLE NANO U/F 32G X 4 MM MISC USE AS INSTRUCTED BY YOUR PHYSICIAN TO CHECK BLOOD SUGAR 100 each 2  . cetirizine (ZYRTEC) 10 MG tablet Take 10 mg by mouth every evening.    . diphenhydrAMINE (BENADRYL) 12.5 MG/5ML elixir Take 25 mg by mouth 4 (four) times daily as needed for allergies.    . DUREZOL 0.05 % EMUL INSTILL 1 DROP INTO RIGHT EYE 4 TIMES A DAY  0  . EPIPEN 2-PAK 0.3 MG/0.3ML SOAJ injection INJECT 0.3MLS INTO THE MUSLE ONCE 2 Device 1  . finasteride (PROSCAR) 5 MG tablet TAKE 1 TABLET BY MOUTH DAILY. 90 tablet 1  . gabapentin (NEURONTIN) 300 MG capsule TAKE 3 CAPSULES (900 MG TOTAL) BY MOUTH 3 (THREE) TIMES DAILY. 270 capsule 5  . Insulin Glargine (LANTUS SOLOSTAR) 100 UNIT/ML Solostar Pen Inject 45 Units into the skin daily at 10 pm. 5 pen 2  . insulin lispro (HUMALOG KWIKPEN) 100 UNIT/ML KiwkPen Inject 0.05 mLs (5 Units total) into the skin 3 (three) times daily. 5 pen 2  . KLOR-CON M10 10 MEQ tablet TAKE TWO TABLETS BY MOUTH DAILY 180 tablet 1  . omeprazole (PRILOSEC) 20 MG capsule TAKE ONE CAPSULE BY MOUTH ONE TIME DAILY 90 capsule 1  . ONE TOUCH ULTRA TEST test strip USE TO CHECK BLOOD SUGAR THREE TIMES DAILY 100 each 2  . predniSONE (DELTASONE) 10 MG tablet Take one tablet as needed for tongue swelling. 10 tablet 1  . PROLENSA 0.07 % SOLN 1 DROP INTO RIGHT EYE ONCE A DAY, BEGIN 2 DAYS BEFORE SURGERY  0  . senna-docusate (SENOKOT-S) 8.6-50 MG tablet Take 2 tablets by mouth 2 (two) times daily. 100 tablet 0  . simethicone (MYLICON) 80 MG chewable tablet Chew 1 tablet (80 mg total) by mouth 3 (three) times daily before meals. 120 tablet 0  . tamsulosin (FLOMAX) 0.4 MG CAPS capsule TAKE ONE CAPSULE BY MOUTH ONE TIME DAILY 90 capsule 1  . traMADol (ULTRAM) 50 MG tablet Take 1 tablet (50 mg total) by mouth every 6 (six) hours as needed. 30 tablet 0    . VICTOZA 18 MG/3ML SOPN INJECT 1.8MG INTO THE SKIN DAILY 9 pen 5  . VIGAMOX 0.5 % ophthalmic solution 1 DROP INTO RIGHT EYE 4 X A DAY STARTING 2 DAYS PRIOR TO SURGERY AND 2 DROPS MORNING OF SURGERY  0   No current facility-administered medications on file prior to visit.     Allergies:  Allergies  Allergen Reactions  . Ace Inhibitors Other (See Comments)    REACTION: Angioedema  . Calcium Carbonate Antacid Other (See Comments)    REACTION: Angioedema of mouth    Review of Systems:  CONSTITUTIONAL: No fevers, chills, night sweats, or weight loss.  EYES: No visual changes or eye pain ENT: No hearing  changes.  No history of nose bleeds.   RESPIRATORY: No cough, wheezing and shortness of breath.   CARDIOVASCULAR: Negative for chest pain, and palpitations.   GI: Negative for abdominal discomfort, blood in stools or black stools.  No recent change in bowel habits.   GU:  No history of incontinence.   MUSCLOSKELETAL: No history of joint pain or swelling.  No myalgias.   SKIN: Negative for lesions, rash, and itching.   ENDOCRINE: Negative for cold or heat intolerance, polydipsia or goiter.   PSYCH:  No depression or anxiety symptoms.   NEURO: As Above.   Vital Signs:  BP 110/68   Pulse 87   Ht _0  (1.702 m)   Wt 166 lb 6 oz (75.5 kg)   SpO2 97%   BMI 26.06 kg/m   Neurological Exam: MENTAL STATUS including orientation to time, place, person, recent and remote memory, attention span and concentration, language, and fund of knowledge is normal.  Speech is not dysarthric.  CRANIAL NERVES:  Pupils are round and and reactive. Normal conjugate, extra-ocular eye movements in all directions of gaze.  Left ptosis (old). Normal facial sensation.  Face is symmetric. Palate elevates symmetrically.  Tongue is midline.  MOTOR:  Motor strength is 5/5 in all extremities, except L dorsiflexion, toe extension and eversion is 4/5.  Bilateral toe flexion is 5-/5, right toe extension is 5-/5.  No  pronator drift.  Tone is normal.    MSRs:  Right                                                                 Left brachioradialis 2+  brachioradialis 2+  biceps 2+  biceps 2+  triceps 1+  triceps 1+  patellar 0  Patellar 0  ankle jerk 0  ankle jerk 0   SENSORY:  Vibration absent at the ankles bilaterally, intact at the knees.  Positive Rhomberg.   COORDINATION/GAIT:  Mild dragging of the left foot, overall gait is stable, unassisted.  He can toe walk, but unable to heel walk on the left.  Data: MRI lumbar spine wo contradst 09/19/2015: 1. Overall stable appearance of the lumbar spine as compared to previous MRI from 04/13/2014. No significant canal stenosis. No evidence for cord compression. 2. Similar effacement of the fat around the descending left L5 nerve root in the left L4-5 lateral recess, again suspicious for a small sequestered disc fragment. This could potentially affect the transiting left L5 nerve root. This is similar to prior. 3. Mild to moderate bilateral foraminal stenosis at L4-5 and L5-S1, stable.  NCS/EMG of the left side 05/30/2016: 1. Chronic sensorimotor polyneuropathy, predominantly axon loss in type, affecting the lower extremities; severe in degree electrically.  2. There is evidence of a superimposed chronic L5 radiculopathy on the left. 3. Left ulnar neuropathy with slowing across the elbow, demyelinating and axon loss in type.   CSF 09/18/2015:  R1  W1  G148*  P135*, IgG index 8.7, CSF cultures negative Las 09/18/2015:  CRP 0.6, vitamin B12 471, ANA neg, HIV neg, RPR, neg, TSH 1.32, HbA1c 7.4*, ESR 25 Labs 06/14/2016:  ESR 3, CRP 0.1, vitamin B12, ACE 44, folate >23.4, MMA 158, ANA neg, copper 106, vitamin B1 22, SPEP with IFE no M protein Lab  Results  Component Value Date   HGBA1C 6.2 07/05/2016     IMPRESSION/PLAN: 1.  Diabetic polyneuropathy affecting the feet - clinically stable.  Given predominance of numbness, will taper gabapentin 646m twice for  one month, if there is no worsening of painful paresthesias, reduce to 304mtwice daily  2.  History of Guillain-Barre Syndrome (May 2017, treated with plasmapheresis) with transient acute worsening of leg paresthesias to the level of the knees during the winter of 2017 for which he underwent repeat NCS/EMG showing chronic sensorimotor polyneuropathy, predominately axonal and left ulnar neuropathy.  There was no temporal dispersion or conduction block. Repeat labs screening for neuropathy returned normal.  CSF testing to determine if he is transitioning into CIDP was declined since symptoms stabilized and involves the feet again.    3.  Left foot drop due to L5 radiculopathy, stable.   Return to clinic in 6 months   The duration of this appointment visit was 20 minutes of face-to-face time with the patient.  Greater than 50% of this time was spent in counseling, explanation of diagnosis, planning of further management, and coordination of care.   Thank you for allowing me to participate in patient's care.  If I can answer any additional questions, I would be pleased to do so.    Sincerely,    Donika K. PaPosey ProntoDO

## 2016-09-16 NOTE — Patient Instructions (Addendum)
Taper gabapentin 600mg  twice for one month, if there is no worsening of painful paresthesias, reduce to 300mg  twice daily  Return to clinic in 6 months

## 2016-09-25 NOTE — Progress Notes (Signed)
   Subjective:    Patient ID: Tanner Mason, male    DOB: 10-29-1940, 76 y.o.   MRN: [id number]  HPI 76 y/o male presents for routine follow up.  T2DM Current regimen includes Lantus 45 units daily and Humalog 8 units with meals (10 with largest meal). Also on Victoza 1.8 mg daily. Only two hypoglycemic episodes in past 2 months.   Fasting blood glucoses: average around 170 Preprandial - few in 200's, most in the 100's QHS - few in 300's, mostly in 100's  HTN Currently taking Metoprolol 75 mg daily and Amlodipine 5 mg daily. Not on ACE-I/ARB due to allergy.  No headaches, no dizziness, no chest pain  Tinea Pedis New fungus on left 1st and 3rd toes, requests therapy, no other foot rash/cracking  HM Up to date  Social Former Smoker  Review of Systems     Objective:   Physical Exam BP 118/62   Pulse 71   Temp 97.6 F (36.4 C) (Oral)   Wt 166 lb 6.4 oz (75.5 kg)   SpO2 97%   BMI 26.06 kg/m   Gen: pleasant male, NAD Cardiac: RRR, S1 and S2 present, no murmur Resp: CTAB, normal effort Skin: tinea pedia of 1st and 3rd toes of left foot  A1C 6.3 today     Assessment & Plan:  Essential hypertension, benign Controlled. -no changes in therapy  Diabetes mellitus (HCC) Controlled. A1C 6.3 -continue Lantus 45 units daily and Humalog 8 units with meals (10 with largest meal). Also on Victoza 1.8 mg daily.   Tinea pedis Tinea pedia of 1st and 3rd toes of left foot -start Terbinafine 250 mg daily for 3 months

## 2016-09-26 ENCOUNTER — Ambulatory Visit (INDEPENDENT_AMBULATORY_CARE_PROVIDER_SITE_OTHER): Payer: Medicare Other | Admitting: Family Medicine

## 2016-09-26 ENCOUNTER — Encounter: Payer: Self-pay | Admitting: Family Medicine

## 2016-09-26 VITALS — BP 118/62 | HR 71 | Temp 97.6°F | Wt 166.4 lb

## 2016-09-26 DIAGNOSIS — I10 Essential (primary) hypertension: Secondary | ICD-10-CM | POA: Diagnosis not present

## 2016-09-26 DIAGNOSIS — B353 Tinea pedis: Secondary | ICD-10-CM | POA: Diagnosis not present

## 2016-09-26 DIAGNOSIS — Z794 Long term (current) use of insulin: Secondary | ICD-10-CM

## 2016-09-26 DIAGNOSIS — E0821 Diabetes mellitus due to underlying condition with diabetic nephropathy: Secondary | ICD-10-CM

## 2016-09-26 DIAGNOSIS — E1140 Type 2 diabetes mellitus with diabetic neuropathy, unspecified: Secondary | ICD-10-CM | POA: Diagnosis not present

## 2016-09-26 LAB — POCT GLYCOSYLATED HEMOGLOBIN (HGB A1C): Hemoglobin A1C: 6.3

## 2016-09-26 MED ORDER — TERBINAFINE HCL 250 MG PO TABS: 250.0000 mg | ORAL_TABLET | Freq: Every day | ORAL | 0 refills | Status: DC

## 2016-09-26 NOTE — Assessment & Plan Note (Signed)
Tinea pedia of 1st and 3rd toes of left foot -start Terbinafine 250 mg daily for 3 months

## 2016-09-26 NOTE — Assessment & Plan Note (Signed)
Controlled. -no changes in therapy

## 2016-09-26 NOTE — Patient Instructions (Signed)
It was nice to see you today.  Diabetes and Blood Pressure is well controlled. Continue current medications.  Start Terbinifine daily for foot fungus.

## 2016-09-26 NOTE — Assessment & Plan Note (Signed)
Controlled. A1C 6.3 -continue Lantus 45 units daily and Humalog 8 units with meals (10 with largest meal). Also on Victoza 1.8 mg daily.

## 2016-09-27 LAB — CMP14+EGFR
ALT: 29 IU/L (ref 0–44)
AST: 25 IU/L (ref 0–40)
Albumin/Globulin Ratio: 1.9 (ref 1.2–2.2)
Albumin: 4.5 g/dL (ref 3.5–4.8)
Alkaline Phosphatase: 76 IU/L (ref 39–117)
BUN/Creatinine Ratio: 10 (ref 10–24)
BUN: 15 mg/dL (ref 8–27)
Bilirubin Total: 0.8 mg/dL (ref 0.0–1.2)
CO2: 21 mmol/L (ref 18–29)
Calcium: 9.1 mg/dL (ref 8.6–10.2)
Chloride: 103 mmol/L (ref 96–106)
Creatinine, Ser: 1.53 mg/dL — ABNORMAL HIGH (ref 0.76–1.27)
GFR calc Af Amer: 51 mL/min/{1.73_m2} — ABNORMAL LOW (ref >59)
GFR calc non Af Amer: 44 mL/min/{1.73_m2} — ABNORMAL LOW (ref >59)
Globulin, Total: 2.4 g/dL (ref 1.5–4.5)
Glucose: 131 mg/dL — ABNORMAL HIGH (ref 65–99)
Potassium: 4.2 mmol/L (ref 3.5–5.2)
Sodium: 141 mmol/L (ref 134–144)
Total Protein: 6.9 g/dL (ref 6.0–8.5)

## 2016-09-30 ENCOUNTER — Other Ambulatory Visit: Payer: Self-pay | Admitting: Family Medicine

## 2016-09-30 DIAGNOSIS — E0821 Diabetes mellitus due to underlying condition with diabetic nephropathy: Secondary | ICD-10-CM

## 2016-09-30 DIAGNOSIS — Z794 Long term (current) use of insulin: Secondary | ICD-10-CM

## 2016-10-01 ENCOUNTER — Encounter: Payer: Self-pay | Admitting: Family Medicine

## 2016-10-01 NOTE — Telephone Encounter (Signed)
2nd request.  Shaleena Crusoe L, RN  

## 2016-10-08 ENCOUNTER — Other Ambulatory Visit: Payer: Self-pay | Admitting: Family Medicine

## 2016-10-22 ENCOUNTER — Other Ambulatory Visit: Payer: Self-pay | Admitting: Family Medicine

## 2016-11-19 ENCOUNTER — Other Ambulatory Visit: Payer: Self-pay | Admitting: *Deleted

## 2016-11-19 MED ORDER — FINASTERIDE 5 MG PO TABS: 5.0000 mg | ORAL_TABLET | Freq: Every day | ORAL | 1 refills | Status: DC

## 2016-11-26 ENCOUNTER — Telehealth: Payer: Self-pay | Admitting: Neurology

## 2016-11-26 NOTE — Telephone Encounter (Signed)
I called patient back and he said that his doctor advised him not to get the flu shot because of Guillain Barre.  Is this true with shingles shot?  Please advise.

## 2016-11-26 NOTE — Telephone Encounter (Signed)
Caller: Taz  Urgent? No  Reason for the call: He ist at the pharmacy now and is looking to get a shingles shot. He said while reading up on it, it mentioned getting the Hermelinda MedicusGilliam Barre. He said he has had that before from the Flu Shot. He would like to know should he get the Shingles shot? Please call. Thanks

## 2016-11-26 NOTE — Telephone Encounter (Signed)
Patient is safe to get shingles vaccination.  Houa Ackert K. Allena KatzPatel, DO

## 2016-11-27 NOTE — Telephone Encounter (Signed)
Patient notified

## 2016-12-02 ENCOUNTER — Other Ambulatory Visit: Payer: Self-pay | Admitting: *Deleted

## 2016-12-02 MED ORDER — INSULIN GLARGINE 100 UNIT/ML SOLOSTAR PEN: 45.0000 [IU] | PEN_INJECTOR | Freq: Every day | SUBCUTANEOUS | 3 refills | Status: DC

## 2016-12-11 ENCOUNTER — Other Ambulatory Visit: Payer: Self-pay | Admitting: *Deleted

## 2016-12-11 MED ORDER — GLUCOSE BLOOD VI STRP: ORAL_STRIP | 0 refills | Status: DC

## 2017-01-01 ENCOUNTER — Ambulatory Visit (INDEPENDENT_AMBULATORY_CARE_PROVIDER_SITE_OTHER): Payer: Medicare Other | Admitting: Family Medicine

## 2017-01-01 ENCOUNTER — Encounter: Payer: Self-pay | Admitting: Family Medicine

## 2017-01-01 VITALS — BP 134/70 | HR 86 | Temp 98.2°F | Ht 67.0 in | Wt 166.6 lb

## 2017-01-01 DIAGNOSIS — I10 Essential (primary) hypertension: Secondary | ICD-10-CM

## 2017-01-01 DIAGNOSIS — E1140 Type 2 diabetes mellitus with diabetic neuropathy, unspecified: Secondary | ICD-10-CM

## 2017-01-01 DIAGNOSIS — E7800 Pure hypercholesterolemia, unspecified: Secondary | ICD-10-CM | POA: Diagnosis not present

## 2017-01-01 DIAGNOSIS — Z794 Long term (current) use of insulin: Secondary | ICD-10-CM

## 2017-01-01 DIAGNOSIS — E0821 Diabetes mellitus due to underlying condition with diabetic nephropathy: Secondary | ICD-10-CM

## 2017-01-01 LAB — POCT GLYCOSYLATED HEMOGLOBIN (HGB A1C): Hemoglobin A1C: 6.8

## 2017-01-01 NOTE — Assessment & Plan Note (Signed)
Lab Results  Component Value Date   LDLCALC 12 03/28/2016   Unusual lipid profile but continue lipitor

## 2017-01-01 NOTE — Assessment & Plan Note (Signed)
BP Readings from Last 3 Encounters:  01/01/17 134/70  09/26/16 118/62  09/16/16 110/68   At goal

## 2017-01-01 NOTE — Assessment & Plan Note (Signed)
A1c below 8 so at goal.  This does not correlate well with his home readings.  Will continue to check every 3 months

## 2017-01-01 NOTE — Patient Instructions (Addendum)
Good to see you today!  Thanks for coming in.  Everything looks good  Keep taking things as you are  Come back in 3- 6 months

## 2017-01-01 NOTE — Progress Notes (Signed)
Subjective  Patient is presenting with the following illnesses  HYPERTENSION Disease Monitoring: Blood pressure range-not checking Chest pain, palpitations- no      Dyspnea- no Medications: Compliance- daily, brought in meds Lightheadedness,Syncope- no   Edema- no  DIABETES Disease Monitoring: Blood Sugar ranges-upper 100s to 200s fasting Polyuria/phagia/dipsia- no      Visual problems- no Medications: Compliance- daily Hypoglycemic symptoms- none recenlty  HYPERLIPIDEMIA Disease Monitoring: See symptoms for Hypertension Medications: Compliance- lipitor daily Right upper quadrant pain- no  Muscle aches- no  Monitoring Labs and Parameters Last A1C:  Lab Results  Component Value Date   HGBA1C 6.8 01/01/2017    Last Lipid:     Component Value Date/Time   CHOL 95 03/28/2016 0933   HDL 27 (L) 03/28/2016 0933   LDLDIRECT 41 06/16/2012 1522    Last Bmet  Potassium  Date Value Ref Range Status  09/26/2016 4.2 3.5 - 5.2 mmol/L Final   Sodium  Date Value Ref Range Status  09/26/2016 141 134 - 144 mmol/L Final   Creat  Date Value Ref Range Status  03/28/2016 1.86 (H) 0.70 - 1.18 mg/dL Final    Comment:      For patients > or = 74 years of age: The upper reference limit for Creatinine is approximately 13% higher for people identified as African-American.      Creatinine, Ser  Date Value Ref Range Status  09/26/2016 1.53 (H) 0.76 - 1.27 mg/dL Final      Last BPs:  BP Readings from Last 3 Encounters:  01/01/17 134/70  09/26/16 118/62  09/16/16 110/68    hief Complaint noted Review of Symptoms - see HPI PMH - Smoking status noted.     Objective Vital Signs reviewed Heart - Regular rate and rhythm.  No murmurs, gallops or rubs.    Lungs:  Normal respiratory effort, chest expands symmetrically. Lungs are clear to auscultation, no crackles or wheezes. Extremities:  No cyanosis, edema, or deformity noted with good range of motion of all major joints.      Assessments/Plans  Essential hypertension, benign BP Readings from Last 3 Encounters:  01/01/17 134/70  09/26/16 118/62  09/16/16 110/68   At goal   Pure hypercholesterolemia Lab Results  Component Value Date   LDLCALC 12 03/28/2016   Unusual lipid profile but continue lipitor

## 2017-01-02 ENCOUNTER — Other Ambulatory Visit: Payer: Self-pay | Admitting: *Deleted

## 2017-01-02 ENCOUNTER — Other Ambulatory Visit: Payer: Self-pay | Admitting: Family Medicine

## 2017-01-02 MED ORDER — OMEPRAZOLE 20 MG PO CPDR: 20.0000 mg | DELAYED_RELEASE_CAPSULE | Freq: Every day | ORAL | 3 refills | Status: DC

## 2017-01-12 ENCOUNTER — Other Ambulatory Visit: Payer: Self-pay | Admitting: Family Medicine

## 2017-01-14 ENCOUNTER — Other Ambulatory Visit: Payer: Self-pay | Admitting: *Deleted

## 2017-01-14 MED ORDER — TAMSULOSIN HCL 0.4 MG PO CAPS: 0.4000 mg | ORAL_CAPSULE | Freq: Every day | ORAL | 0 refills | Status: DC

## 2017-01-15 ENCOUNTER — Other Ambulatory Visit: Payer: Self-pay | Admitting: *Deleted

## 2017-01-16 ENCOUNTER — Other Ambulatory Visit: Payer: Self-pay | Admitting: *Deleted

## 2017-01-16 DIAGNOSIS — B353 Tinea pedis: Secondary | ICD-10-CM

## 2017-01-16 MED ORDER — TERBINAFINE HCL 250 MG PO TABS: 250.0000 mg | ORAL_TABLET | Freq: Every day | ORAL | 0 refills | Status: DC

## 2017-01-16 MED ORDER — METOPROLOL TARTRATE 75 MG PO TABS: 75.0000 mg | ORAL_TABLET | Freq: Two times a day (BID) | ORAL | 1 refills | Status: DC

## 2017-01-17 NOTE — Telephone Encounter (Signed)
Received fax from [facility] stating metoprolol 75 mg is on back order until end of October. Please send in the 25 mg.  Clovis Pu, RN

## 2017-01-20 ENCOUNTER — Ambulatory Visit (INDEPENDENT_AMBULATORY_CARE_PROVIDER_SITE_OTHER): Payer: Medicare Other | Admitting: Student

## 2017-01-20 ENCOUNTER — Encounter: Payer: Self-pay | Admitting: Student

## 2017-01-20 VITALS — BP 136/72 | HR 93 | Temp 98.3°F | Wt 163.0 lb

## 2017-01-20 DIAGNOSIS — L02212 Cutaneous abscess of back [any part, except buttock]: Secondary | ICD-10-CM

## 2017-01-20 MED ORDER — DOXYCYCLINE HYCLATE 100 MG PO CAPS: 100.0000 mg | ORAL_CAPSULE | Freq: Two times a day (BID) | ORAL | 0 refills | Status: DC

## 2017-01-20 NOTE — Progress Notes (Signed)
Subjective:    Tanner Mason is a 76 y.o. old male here for boil on his back  HPI Boil on the back: for one week. Had this before a number of years ago. Is getting bigger and hurting more. Starting to sip puss. He put a bandage on it. No medication but has been doing warm compress. Denies fever, chills, nausea or vomiting. He has well controlled diabetes. He is on lantus and Humalog. Denies smoking or recreational drug use.  Patient is on daily aspirin. He is not on other blood thinners.  PMH/Problem List: has Diabetes mellitus (HCC); Pure hypercholesterolemia; ANEMIA, OTHER, UNSPECIFIED; Peripheral autonomic neuropathy due to diabetes mellitus (HCC); Essential hypertension, benign; GASTROESOPHAGEAL REFLUX DISEASE, CHRONIC; Enlarged prostate with lower urinary tract symptoms (LUTS); Stage 3 chronic renal impairment associated with type 2 diabetes mellitus (HCC); Overweight (BMI 25.0-29.9); Left foot drop; Lumbar back pain with radiculopathy affecting left lower extremity; Gait abnormality; Guillain Barr syndrome (HCC); Type 2 diabetes mellitus with diabetic neuropathy, unspecified (HCC); Thrombocytopenia (HCC); Normocytic anemia; and Tinea pedis on his problem list.   has a past medical history of Abdominal ultrasound, abnormal (02/2004); Angioedema (07/13/2014); Atypical chest pain (12/14/2013); Cellulitis and abscess (07/14/2015); Diabetes mellitus without complication (HCC); DIASTASIS RECTI (12/08/2007); Elbow dislocation (5284,1324); Encounter for diagnostic endoscopy (10/22/04); History of esophagogastroduodenoscopy (08/13/2007); Treadmill stress test negative for angina pectoris (06/2002); and ULNAR NERVE ENTRAPMENT, RIGHT (06/07/2008).  FH:  Family History  Problem Relation Age of Onset  . Alzheimer's disease Mother        died age 44  . Aortic aneurysm Father        died age 43  . Anuerysm Father   . Aortic aneurysm Brother        repaired plus AI graft  . Diabetes Brother     [facility] Social  History  Substance Use Topics  . Smoking status: Former Smoker    Packs/day: 1.00    Years: 10.00    Types: Cigarettes    Start date: 1940-06-15    Quit date: 06/10/1967  . Smokeless tobacco: Never Used  . Alcohol use 1.0 oz/week    2 Standard drinks or equivalent per week     Comment: occasional    Review of Systems Review of systems negative except for pertinent positives and negatives in history of present illness above.     Objective:     Vitals:   01/20/17 0944  BP: 136/72  Pulse: 93  Temp: 98.3 F (36.8 C)  TempSrc: Oral  SpO2: 99%  Weight: 163 lb (73.9 kg)   Body mass index is 25.53 kg/m.  Physical Exam GEN: appears well, no apparent distress. CVS: RRR, nl S1&S2, no murmurs, no edema RESP: no IWOB MSK: no focal tenderness or notable swelling SKIN: a circular firm mass about 1 inch in diameter with overlying skin erythema and increased warmth to touch over his left upper back. Oozes pus when squeezed. See picture for more.    NEURO: alert and oiented appropriately, no gross deficits  PSYCH: euthymic mood with congruent affect    Assessment and Plan:  1. Abscess of back: successfully incised and drained the abscess as below. Gave prescription for doxycycline 100 mg twice a day for 7 days. Advised to avoid sun exposure and diary products within 2 hours of taking this medication.   Abscess drainage Discussed with the patient about the risk and benefits of the procedure. Obtained written consent. Site was marked. The area was cleaned with alcohol swab and anesthestized with  1% lidocaine 3 cc using 1.5 inch needle. The area was cleaned with betadine x3. After the betadine has dried off, an incision  about 1 cm long was made along vertical axis using a scalpel #11. About 2 cc of purulent material along grayish well formed pus was drained. The incision was packed with iodoform packing strip. The incision site was covered lightly by guaze and left open to heal by  secondary means. There was no complication after the procedure. Patient tolerated the procedure well. Return precautions were discussed. Patient to follow-up in clinic in 3 days.   Return in about 3 days (around 01/23/2017) for Abscess.  Almon Hercules, MD 01/20/17 Pager: [phone]

## 2017-01-20 NOTE — Patient Instructions (Signed)
It was great seeing you today! We have addressed the following issues today 1. Abscess: we have incised and drained the abscess today. We have packed the wound as well. Please keep the dressing on. Recommend avoiding water on the dressing. Please let us know if you have fever or other symptoms concerning to you. Please come back and see [place] in 3 days to have the wound pack removed. We have also sent a prescription for doxycycline to the pharmacy. Please pick up this prescription to start taking.   If we did any lab work today, and the results require attention, either me or my nurse will get in touch with you. If everything is normal, you will get a letter in mail and a message via . If you don't hear from [place] in two weeks, please give [place] a call. Otherwise, we look forward to seeing you again at your next visit. If you have any questions or concerns before then, please call the clinic at [phone].  Please bring all your medications to every doctors visit  Sign up for My Chart to have easy access to your labs results, and communication with your Primary care physician.    Please check-out at the front desk before leaving the clinic.    Take Care,   Dr. Alanda Slim

## 2017-01-23 ENCOUNTER — Encounter: Payer: Self-pay | Admitting: Student

## 2017-01-23 ENCOUNTER — Ambulatory Visit (INDEPENDENT_AMBULATORY_CARE_PROVIDER_SITE_OTHER): Payer: Medicare Other | Admitting: Student

## 2017-01-23 VITALS — BP 128/76 | HR 81 | Temp 98.3°F | Ht 67.0 in | Wt 163.6 lb

## 2017-01-23 DIAGNOSIS — L02212 Cutaneous abscess of back [any part, except buttock]: Secondary | ICD-10-CM

## 2017-01-23 NOTE — Patient Instructions (Signed)
It was great seeing you today! We have addressed the following issues today 1. Abscess: we took out the packing today. You may remove the dressing in about 48 hours. You can redress it if needed. I recommend avoiding showering or letting the water into the wound. The wound should heal on its own. Continue the antibiotic until you complete the course. Please come back and see [place] if you have fever or other symptoms concerning to you.  If we did any lab work today, and the results require attention, either me or my nurse will get in touch with you. If everything is normal, you will get a letter in mail and a message via . If you don't hear from [place] in two weeks, please give [place] a call. Otherwise, we look forward to seeing you again at your next visit. If you have any questions or concerns before then, please call the clinic at [phone].  Please bring all your medications to every doctors visit  Sign up for My Chart to have easy access to your labs results, and communication with your Primary care physician.    Please check-out at the front desk before leaving the clinic.    Take Care,   Dr. Alanda Slim

## 2017-01-23 NOTE — Progress Notes (Signed)
  Subjective:    Tanner Mason is a 76 y.o. old male here for follow up on Abscess  HPI Abscess: patient had an abscess on his left upper back that was drained and packed three days ago. He was started on Doxycycline.  Today, he reports some drainage from I&D site in to the dressing but denies fever. Pain has improved.  He reports taking his Doxycycline.   Patient has history of well controlled diabetes.  PMH/Problem List: has Diabetes mellitus (HCC); Pure hypercholesterolemia; ANEMIA, OTHER, UNSPECIFIED; Peripheral autonomic neuropathy due to diabetes mellitus (HCC); Essential hypertension, benign; GASTROESOPHAGEAL REFLUX DISEASE, CHRONIC; Enlarged prostate with lower urinary tract symptoms (LUTS); Stage 3 chronic renal impairment associated with type 2 diabetes mellitus (HCC); Overweight (BMI 25.0-29.9); Left foot drop; Lumbar back pain with radiculopathy affecting left lower extremity; Gait abnormality; Guillain Barr syndrome (HCC); Type 2 diabetes mellitus with diabetic neuropathy, unspecified (HCC); Thrombocytopenia (HCC); Normocytic anemia; and Tinea pedis on his problem list.   has a past medical history of Abdominal ultrasound, abnormal (02/2004); Angioedema (07/13/2014); Atypical chest pain (12/14/2013); Cellulitis and abscess (07/14/2015); Diabetes mellitus without complication (HCC); DIASTASIS RECTI (12/08/2007); Elbow dislocation (9147,8295); Encounter for diagnostic endoscopy (10/22/04); History of esophagogastroduodenoscopy (08/13/2007); Treadmill stress test negative for angina pectoris (06/2002); and ULNAR NERVE ENTRAPMENT, RIGHT (06/07/2008).  FH:  Family History  Problem Relation Age of Onset  . Alzheimer's disease Mother        died age 38  . Aortic aneurysm Father        died age 37  . Anuerysm Father   . Aortic aneurysm Brother        repaired plus AI graft  . Diabetes Brother     [facility] Social History  Substance Use Topics  . Smoking status: Former Smoker    Packs/day: 1.00   Years: 10.00    Types: Cigarettes    Start date: [birth year]-1942    Quit date: 06/10/1967  . Smokeless tobacco: Never Used  . Alcohol use 1.0 oz/week    2 Standard drinks or equivalent per week     Comment: occasional    Review of Systems Review of systems negative except for pertinent positives and negatives in history of present illness above.     Objective:     Vitals:   01/23/17 1458  BP: 128/76  Pulse: 81  Temp: 98.3 F (36.8 C)  TempSrc: Oral  SpO2: 96%  Weight: 163 lb 9.6 oz (74.2 kg)  Height:  (1.702 m)   Body mass index is 25.62 kg/m.  Physical Exam GEN: appears well, no apparent distress. CVS: RRR, nl S1&S2 RESP: no IWOB SKIN: some stain of the dressing from drainage, some skin erythema around the incision, no active drainage or bleeding after removal of the isoform packing. No increased warmth to touch. NEURO: alert and oiented appropriately, no gross deficits  PSYCH: euthymic mood with congruent affect    Assessment and Plan:  1. Abscess of back: s/p I&D. Idoform packing removed today. The wound was left open to heal by secondary intention. Patient without significant local or systemic symptoms. Gauze dressing applied. Can change the dressing in 48 hours if needed. Advised to avoid water until the wound heals. Return precautions discussed. Advised him to complete his doxy.  Return if symptoms worsen or fail to improve.  Almon Hercules, MD 01/23/17 Pager: [phone]

## 2017-01-23 NOTE — Assessment & Plan Note (Signed)
s/p I&D. Idoform packing removed today. The wound was left open to heal by secondary intention. Patient without significant local or systemic symptoms. Gauze dressing applied. Can change the dressing in 48 hours if needed. Advised to avoid water until the wound heals. Return precautions discussed. Advised him to complete his doxy.

## 2017-01-28 ENCOUNTER — Encounter: Payer: Self-pay | Admitting: Family Medicine

## 2017-01-29 ENCOUNTER — Other Ambulatory Visit: Payer: Self-pay | Admitting: *Deleted

## 2017-01-29 MED ORDER — POTASSIUM CHLORIDE CRYS ER 10 MEQ PO TBCR: 20.0000 meq | EXTENDED_RELEASE_TABLET | Freq: Every day | ORAL | 1 refills | Status: DC

## 2017-01-31 ENCOUNTER — Other Ambulatory Visit: Payer: Self-pay | Admitting: *Deleted

## 2017-01-31 DIAGNOSIS — Z794 Long term (current) use of insulin: Secondary | ICD-10-CM

## 2017-01-31 DIAGNOSIS — E0821 Diabetes mellitus due to underlying condition with diabetic nephropathy: Secondary | ICD-10-CM

## 2017-01-31 MED ORDER — INSULIN LISPRO 100 UNIT/ML (KWIKPEN): 8.0000 [IU] | PEN_INJECTOR | Freq: Three times a day (TID) | SUBCUTANEOUS | 5 refills | Status: DC

## 2017-02-05 ENCOUNTER — Ambulatory Visit: Payer: Medicare Other | Admitting: Family Medicine

## 2017-02-21 ENCOUNTER — Other Ambulatory Visit: Payer: Self-pay | Admitting: Family Medicine

## 2017-02-21 MED ORDER — GABAPENTIN 300 MG PO CAPS: 900.0000 mg | ORAL_CAPSULE | Freq: Three times a day (TID) | ORAL | 5 refills | Status: DC

## 2017-02-21 NOTE — Progress Notes (Signed)
Subjective  Patient is presenting with the following illnesses     Chief Complaint noted Review of Symptoms - see HPI PMH - Smoking status noted.     Objective Vital Signs reviewed     Assessments/Plans  No problem-specific Assessment & Plan notes found for this encounter.   See after visit summary for details of patient instuctions 

## 2017-03-05 ENCOUNTER — Encounter: Payer: Self-pay | Admitting: Student

## 2017-03-05 ENCOUNTER — Ambulatory Visit: Payer: Medicare Other | Admitting: Student

## 2017-03-05 ENCOUNTER — Other Ambulatory Visit: Payer: Self-pay

## 2017-03-05 VITALS — BP 120/72 | HR 86 | Temp 98.2°F | Ht 67.0 in | Wt 168.6 lb

## 2017-03-05 DIAGNOSIS — Z9889 Other specified postprocedural states: Secondary | ICD-10-CM | POA: Diagnosis not present

## 2017-03-05 NOTE — Patient Instructions (Signed)
It was great seeing you today! I recommend dressing with gauze or warm compress as we discussed in the office today.  If this does not resolve in the next 1-2 weeks, or if you have worsening of your symptoms or fever please give us a call.  Please follow-up with your primary care doctor for your diabetes.   If we did any lab work today, and the results require attention, either me or my nurse will get in touch with you. If everything is normal, you will get a letter in mail and a message via . If you don't hear from us in two weeks, please give us a call. Otherwise, we look forward to seeing you again at your next visit. If you have any questions or concerns before then, please call the clinic at [phone].  Please bring all your medications to every doctors visit  Sign up for My Chart to have easy access to your labs results, and communication with your Primary care physician.    Please check-out at the front desk before leaving the clinic.    Take Care,   Dr. Alanda SlimGonfa

## 2017-03-05 NOTE — Progress Notes (Signed)
Subjective:    Tanner Mason is a 76 y.o. old male here for follow-up on drainage from I&D site  HPI Drainage from I&D site: patient had an abscess drained about 2 months ago.  He was treated with doxycycline for 10 days.  He states that he continues to notice small blood tinged drainage from the I&D site.  He says he has to cover it with bandage to avoid contact with his shirt.  He denies fever, recurrence of swelling or pain.   PMH/Problem List: has Diabetes mellitus (HCC); Pure hypercholesterolemia; ANEMIA, OTHER, UNSPECIFIED; Peripheral autonomic neuropathy due to diabetes mellitus (HCC); Essential hypertension, benign; GASTROESOPHAGEAL REFLUX DISEASE, CHRONIC; Enlarged prostate with lower urinary tract symptoms (LUTS); Stage 3 chronic renal impairment associated with type 2 diabetes mellitus (HCC); Overweight (BMI 25.0-29.9); Left foot drop; Lumbar back pain with radiculopathy affecting left lower extremity; Abscess of back; Gait abnormality; Guillain Barr syndrome (HCC); Type 2 diabetes mellitus with diabetic neuropathy, unspecified (HCC); Thrombocytopenia (HCC); Normocytic anemia; and Tinea pedis on their problem list.   has a past medical history of Abdominal ultrasound, abnormal (02/2004), Angioedema (07/13/2014), Atypical chest pain (12/14/2013), Cellulitis and abscess (07/14/2015), Diabetes mellitus without complication (HCC), DIASTASIS RECTI (12/08/2007), Elbow dislocation ([id number](1961,1978), Encounter for diagnostic endoscopy (10/22/04), History of esophagogastroduodenoscopy (08/13/2007), Treadmill stress test negative for angina pectoris (06/2002), and ULNAR NERVE ENTRAPMENT, RIGHT (06/07/2008).  FH:  Family History  Problem Relation Age of Onset  . Alzheimer's disease Mother        died age 74  . Aortic aneurysm Father        died age 74  . Anuerysm Father   . Aortic aneurysm Brother        repaired plus AI graft  . Diabetes Brother     [facility]   Tobacco Use  . Smoking status:  Former Smoker    Packs/day: 1.00    Years: 10.00    Pack years: 10.00    Types: Cigarettes    Start date: 1940/05/[birth year]    Last attempt to quit: 06/10/1967    Years since quitting: 49.7  . Smokeless tobacco: Never Used  Substance Use Topics  . Alcohol use: Yes    Alcohol/week: 1.0 oz    Types: 2 Standard drinks or equivalent per week    Comment: occasional  . Drug use: No    Review of Systems Review of systems negative except for pertinent positives and negatives in history of present illness above.     Objective:     Vitals:   03/05/17 1343  BP: 120/72  Pulse: 86  Temp: 98.2 F (36.8 C)  TempSrc: Oral  SpO2: 97%  Weight: 168 lb 9.6 oz (76.5 kg)  Height: 5\' 7"  (1.702 m)   Body mass index is 26.41 kg/m.  Physical Exam GENERAL: appears well, no ditress LUNGS:  No IWOB HEART:  RRR SKIN: Noted small remnant of previous I&D. There was still a little bit of grayish blood-tinged material with pressure.  No palpable underlying fluid loculation.  No surrounding skin erythema.  See picture below for more. N.B. The red spot is fresh blood after squeezing.    PSYCH: normal affect    Assessment and Plan:  1. Status post incision and drainage: This is just a slow healing process after I&D. There is no underlying fluid loculation nor findings suggestive for cellulitis.  Recommended against occlusion with Band-Aid.  Gave him some 2 x 2 gauzes in case he has to cover it to protect his clothes.  This would allow free drainage and aeration which could promote faster healing.  Discussed return precautions as well.   Return if symptoms worsen or fail to improve.  Almon Herculesaye T Gonfa, MD 03/05/17 Pager: [phone]

## 2017-03-19 ENCOUNTER — Other Ambulatory Visit: Payer: Self-pay | Admitting: Family Medicine

## 2017-03-19 MED ORDER — ATORVASTATIN CALCIUM 40 MG PO TABS: 40.0000 mg | ORAL_TABLET | Freq: Every day | ORAL | 3 refills | Status: DC

## 2017-03-24 ENCOUNTER — Ambulatory Visit: Payer: Medicare Other | Admitting: Neurology

## 2017-03-24 ENCOUNTER — Encounter: Payer: Self-pay | Admitting: Neurology

## 2017-03-24 VITALS — BP 138/78 | HR 91 | Ht 67.0 in | Wt 163.4 lb

## 2017-03-24 DIAGNOSIS — E0842 Diabetes mellitus due to underlying condition with diabetic polyneuropathy: Secondary | ICD-10-CM | POA: Diagnosis not present

## 2017-03-24 NOTE — Progress Notes (Signed)
Follow-up Visit   Date: 03/24/17    Tanner Mason MRN: [id number] DOB: [birth year]   Interim History: Tanner Mason is a 76 y.o. right-handed Caucasian male with insulin-dependent diabetes mellitus c/b renal insufficiency, hypertension, basal cell carcinoma of the ear s/p resection, hyperlipidemia, and Guillian-Barre syndrome (May 2018) returning for follow-up of neuropathy.  History of present illness: In May 2017, he was visiting [facility] and suddenly developed tingling of the feet and lower legs. He was evaluated at the ER in [place] where MRI brain and lumbar spine was unremarkable.  The following day he came to [facility] and was unable to walk which prompted him to go to the [facility] ED.  By the time he arrived at the ED, his tingling and numbness involved the level of thigh.  Patient was hospitalized at [facility] from 5/21 - 09/26/2015 and work-up consistent with GBS. He completed 5 sessions of plasmapheresis. After the 3rd session, he began noticing improved paresthesias.  He was discharged to rehab facility and went home on 6/12 with a walker and out-patient PT.  He also has left foot weakness which has been present for at least the past 4 years.  He was told that he may need back surgery for this some time back, but decided to pursue it conservatively. He takes gabapentin 958m TID which controls painful paresthesias.  UPDATE 06/14/2016:  Patient scheduled a sooner appointment because last week, he started having a tight sensation over his entire lower leg and ankle up to his knees. Symptoms are constant and he has not noticed anything to trigger it.  He feels that his knees do not feel as stable, but is still walking independently without a cane.    UPDATE 09/16/2016:  Since his last visit, his neuropathy has remained stable and restricted to mostly the feet.  His labs returned normal and he did not wish to pursue repeat CSF testing since symptoms stabilized.   He has no new complaints and is doing well.   UPDATE 03/24/2017:  He is here for follow-up visit.  He denies any worsening of neuropathy, but complains of imbalance after walking half a mile.  He has not had any falls.  He reduced gabapentin to 9034mBID, but has noticed occasional shooting pain in the feet. No new complaints.   Medications:  Current Outpatient Medications on File Prior to Visit  Medication Sig Dispense Refill  . amLODipine (NORVASC) 5 MG tablet TAKE 1 TABLET (5 MG TOTAL) BY MOUTH DAILY. 30 tablet 0  . aspirin (BAYER CHILDRENS ASPIRIN) 81 MG chewable tablet Chew 81 mg by mouth daily.      . Marland Kitchentorvastatin (LIPITOR) 40 MG tablet Take 1 tablet (40 mg total) by mouth daily. 90 tablet 3  . BD PEN NEEDLE NANO U/F 32G X 4 MM MISC USE AS INSTRUCTED BY YOUR PHYSICIAN TO CHECK BLOOD SUGAR 100 each 2  . cetirizine (ZYRTEC) 10 MG tablet Take 10 mg by mouth every evening.    . DUREZOL 0.05 % EMUL INSTILL 1 DROP INTO RIGHT EYE 4 TIMES A DAY  0  . EPIPEN 2-PAK 0.3 MG/0.3ML SOAJ injection INJECT 0.3MLS INTO THE MUSLE ONCE 2 Device 1  . finasteride (PROSCAR) 5 MG tablet Take 1 tablet (5 mg total) by mouth daily. 90 tablet 1  . gabapentin (NEURONTIN) 300 MG capsule Take 3 capsules (900 mg total) by mouth 3 (three) times daily. 270 capsule 5  . Insulin Glargine (LANTUS SOLOSTAR) 100 UNIT/ML  Solostar Pen Inject 45 Units into the skin daily at 10 pm. 5 pen 3  . insulin lispro (HUMALOG KWIKPEN) 100 UNIT/ML KiwkPen Inject 0.08 mLs (8 Units total) into the skin 3 (three) times daily. Take 10 units with largest meal. 5 pen 5  . Metoprolol Tartrate 75 MG TABS Take 75 mg by mouth 2 (two) times daily at 10 AM and 5 PM. 180 tablet 1  . omeprazole (PRILOSEC) 20 MG capsule Take 1 capsule (20 mg total) by mouth daily. 90 capsule 3  . omeprazole (PRILOSEC) 20 MG capsule TAKE ONE CAPSULE BY MOUTH ONE TIME DAILY 90 capsule 3  . ONE TOUCH ULTRA TEST test strip USE TO TEST BLOOD SUGAR THREE TIMES DAILY. ICD-10  CODE: E11.9. 100 each 3  . potassium chloride (KLOR-CON M10) 10 MEQ tablet Take 2 tablets (20 mEq total) by mouth daily. 180 tablet 1  . predniSONE (DELTASONE) 10 MG tablet Take one tablet as needed for tongue swelling. 10 tablet 1  . PROLENSA 0.07 % SOLN 1 DROP INTO RIGHT EYE ONCE A DAY, BEGIN 2 DAYS BEFORE SURGERY  0  . tamsulosin (FLOMAX) 0.4 MG CAPS capsule Take 1 capsule (0.4 mg total) by mouth daily. 90 capsule 0  . terbinafine (LAMISIL) 250 MG tablet Take 1 tablet (250 mg total) by mouth daily. 90 tablet 0  . traMADol (ULTRAM) 50 MG tablet Take 1 tablet (50 mg total) by mouth every 6 (six) hours as needed. 30 tablet 0  . VICTOZA 18 MG/3ML SOPN INJECT 1.8MG INTO THE SKIN DAILY 9 pen 5  . VIGAMOX 0.5 % ophthalmic solution 1 DROP INTO RIGHT EYE 4 X A DAY STARTING 2 DAYS PRIOR TO SURGERY AND 2 DROPS MORNING OF SURGERY  0   No current facility-administered medications on file prior to visit.     Allergies:  Allergies  Allergen Reactions  . Ace Inhibitors Other (See Comments)    REACTION: Angioedema  . Calcium Carbonate Antacid Other (See Comments)    REACTION: Angioedema of mouth    Review of Systems:  CONSTITUTIONAL: No fevers, chills, night sweats, or weight loss.  EYES: No visual changes or eye pain ENT: No hearing changes.  No history of nose bleeds.   RESPIRATORY: No cough, wheezing and shortness of breath.   CARDIOVASCULAR: Negative for chest pain, and palpitations.   GI: Negative for abdominal discomfort, blood in stools or black stools.  No recent change in bowel habits.   GU:  No history of incontinence.   MUSCLOSKELETAL: No history of joint pain or swelling.  No myalgias.   SKIN: Negative for lesions, rash, and itching.   ENDOCRINE: Negative for cold or heat intolerance, polydipsia or goiter.   PSYCH:  No depression or anxiety symptoms.   NEURO: As Above.   Vital Signs:  BP 138/78   Pulse 91   Ht '5\' 7"'  (1.702 m)   Wt 163 lb 6 oz (74.1 kg)   SpO2 96%   BMI 25.59  kg/m   Neurological Exam: MENTAL STATUS including orientation to time, place, person, recent and remote memory, attention span and concentration, language, and fund of knowledge is normal.  Speech is not dysarthric.  CRANIAL NERVES:  Pupils are round and and reactive. Normal conjugate, extra-ocular eye movements in all directions of gaze.  Left ptosis (old).  Face is symmetric. Palate elevates symmetrically.  Tongue is midline.  MOTOR:  Motor strength is 5/5 in all extremities, except L dorsiflexion, toe extension and eversion is 4/5.  Bilateral toe flexion is 5-/5, right toe extension is 5-/5.  No pronator drift.  Tone is normal.    MSRs:  Right                                                                 Left brachioradialis 2+  brachioradialis 2+  biceps 2+  biceps 2+  triceps 1+  triceps 1+  patellar 0  Patellar 0  ankle jerk 0  ankle jerk 0   SENSORY: Vibration reduced at the ankles (improved).  Positive Rhomberg.   COORDINATION/GAIT:  Gait appears stable, not dragging the left foot as much (improved), unassisted.  He is able to take about 4 steps with tandem gait.     Data: MRI lumbar spine wo contradst 09/19/2015: 1. Overall stable appearance of the lumbar spine as compared to previous MRI from 04/13/2014. No significant canal stenosis. No evidence for cord compression. 2. Similar effacement of the fat around the descending left L5 nerve root in the left L4-5 lateral recess, again suspicious for a small sequestered disc fragment. This could potentially affect the transiting left L5 nerve root. This is similar to prior. 3. Mild to moderate bilateral foraminal stenosis at L4-5 and L5-S1, stable.  NCS/EMG of the left side 05/30/2016: 1. Chronic sensorimotor polyneuropathy, predominantly axon loss in type, affecting the lower extremities; severe in degree electrically.  2. There is evidence of a superimposed chronic L5 radiculopathy on the left. 3. Left ulnar neuropathy with  slowing across the elbow, demyelinating and axon loss in type.   CSF 09/18/2015:  R1  W1  G148*  P135*, IgG index 8.7, CSF cultures negative Las 09/18/2015:  CRP 0.6, vitamin B12 471, ANA neg, HIV neg, RPR, neg, TSH 1.32, HbA1c 7.4*, ESR 25 Labs 06/14/2016:  ESR 3, CRP 0.1, vitamin B12, ACE 44, folate >23.4, MMA 158, ANA neg, copper 106, vitamin B1 22, SPEP with IFE no M protein Lab Results  Component Value Date   HGBA1C 6.8 01/01/2017    IMPRESSION/PLAN: 1.  Diabetic polyneuropathy affecting the feet - clinically stable.  Adjust gabapentin to 644m three times daily.  Recommend starting balance exercises.  He had many questions about prognosis of neuropathy, which were answered to the best of my ability.   2.  History of Guillain-Barre Syndrome (May 2017, treated with plasmapheresis).  3.  Left foot drop due to L5 radiculopathy, stable.  Return to clinic in 1 year or sooner as needed.   Greater than 50% of this 20 minute visit was spent in counseling, explanation of diagnosis, planning of further management, and coordination of care.    Thank you for allowing me to participate in patient's care.  If I can answer any additional questions, I would be pleased to do so.    Sincerely,    Chong Wojdyla K. PPosey Pronto DO

## 2017-03-24 NOTE — Patient Instructions (Signed)
Adjust gabapentin to 600mg  three times daily  Start balance exercises  Return to clinic in 1 year

## 2017-03-31 LAB — HM DIABETES EYE EXAM

## 2017-04-08 ENCOUNTER — Other Ambulatory Visit: Payer: Self-pay | Admitting: Family Medicine

## 2017-04-08 MED ORDER — TAMSULOSIN HCL 0.4 MG PO CAPS: 0.4000 mg | ORAL_CAPSULE | Freq: Every day | ORAL | 3 refills | Status: DC

## 2017-04-08 MED ORDER — LIRAGLUTIDE 18 MG/3ML ~~LOC~~ SOPN: PEN_INJECTOR | SUBCUTANEOUS | 11 refills | Status: DC

## 2017-04-11 ENCOUNTER — Encounter: Payer: Medicare Other | Attending: Physical Medicine & Rehabilitation | Admitting: Physical Medicine & Rehabilitation

## 2017-04-11 ENCOUNTER — Encounter: Payer: Self-pay | Admitting: Physical Medicine & Rehabilitation

## 2017-04-11 VITALS — BP 148/81 | HR 78

## 2017-04-11 DIAGNOSIS — Z85828 Personal history of other malignant neoplasm of skin: Secondary | ICD-10-CM | POA: Insufficient documentation

## 2017-04-11 DIAGNOSIS — Z87891 Personal history of nicotine dependence: Secondary | ICD-10-CM | POA: Diagnosis not present

## 2017-04-11 DIAGNOSIS — G610 Guillain-Barre syndrome: Secondary | ICD-10-CM | POA: Diagnosis not present

## 2017-04-11 DIAGNOSIS — R2689 Other abnormalities of gait and mobility: Secondary | ICD-10-CM | POA: Diagnosis not present

## 2017-04-11 DIAGNOSIS — R269 Unspecified abnormalities of gait and mobility: Secondary | ICD-10-CM

## 2017-04-11 DIAGNOSIS — Z79899 Other long term (current) drug therapy: Secondary | ICD-10-CM | POA: Insufficient documentation

## 2017-04-11 DIAGNOSIS — Z820 Family history of epilepsy and other diseases of the nervous system: Secondary | ICD-10-CM | POA: Insufficient documentation

## 2017-04-11 DIAGNOSIS — E1142 Type 2 diabetes mellitus with diabetic polyneuropathy: Secondary | ICD-10-CM | POA: Insufficient documentation

## 2017-04-11 DIAGNOSIS — Z833 Family history of diabetes mellitus: Secondary | ICD-10-CM | POA: Diagnosis not present

## 2017-04-11 DIAGNOSIS — Z8249 Family history of ischemic heart disease and other diseases of the circulatory system: Secondary | ICD-10-CM | POA: Diagnosis not present

## 2017-04-11 DIAGNOSIS — D696 Thrombocytopenia, unspecified: Secondary | ICD-10-CM | POA: Diagnosis not present

## 2017-04-11 NOTE — Progress Notes (Signed)
Subjective:    Patient ID: Tanner Mason, male    DOB: 01/05/1941, 76 y.o.   MRN: [id number]  HPI  [age] y.o. male with history of DMT2 with neuropathy presents for follow up for GBS.    Last clinic visit 04/11/16. Since that times, pt states he continues to do exercise. He saw Neurology, notes reviewed, adjusted neuropathic pain meds. He has his CBC drawn last year, which showed persistent thrombocytopenia. Denies falls. g  Pain Inventory Average Pain 0 Pain Right Now 0 My pain is na  In the last 24 hours, has pain interfered with the following? General activity 0 Relation with others 0 Enjoyment of life 0 What TIME of day is your pain at its worst? na Sleep (in general) Good  Pain is worse with: na Pain improves with: nA Relief from Meds: NA  Mobility walk without assistance  Function retired  Neuro/Psych No problems in this area  Prior Studies Any changes since last visit?  no  Physicians involved in your care Any changes since last visit?  no   Family History  Problem Relation Age of Onset  . Alzheimer's disease Mother        died age 74  . Aortic aneurysm Father        died age 74  . Anuerysm Father   . Aortic aneurysm Brother        repaired plus AI graft  . Diabetes Brother    Social History   Socioeconomic History  . Marital status: Married    Spouse name: Junious DresserConnie  . Number of children: 2  . Years of education: 4 college  . Highest education level: None  Social Needs  . Financial resource strain: None  . Food insecurity - worry: None  . Food insecurity - inability: None  . Transportation needs - medical: None  . Transportation needs - non-medical: None  Occupational History  . Occupation: Runner, broadcasting/film/videoteacher    Comment: retired   . Occupation: MEDIA SPECIALIST    Employer: RETIRED  Tobacco Use  . Smoking status: Former Smoker    Packs/day: 1.00    Years: 10.00    Pack years: 10.00    Types: Cigarettes    Start date: 01/05/1941    Last  attempt to quit: 06/10/1967    Years since quitting: 49.8  . Smokeless tobacco: Never Used  Substance and Sexual Activity  . Alcohol use: Yes    Alcohol/week: 1.0 oz    Types: 2 Standard drinks or equivalent per week    Comment: occasional  . Drug use: No  . Sexual activity: Yes    Partners: Female  Other Topics Concern  . None  Social History Narrative   Daughter, Nehemiah SettleBrooke in [place], has 2 children   Daugher, Marcelino DusterMichelle, in [place], 2 children      He retired from teaching in 2010      Health Care POA:    Emergency Contact: wife Junious DresserConnie   End of Life Plan:    Who lives with you: wife   Any pets: none   Diet: Patient has a varied diet of vegetables, protein, and starch.  Pt reports enjoying his sweets and carbohydrates.   Exercise: Pt exercises 5x week at [facility].  Cardio, strength    Seatbelts: Pt reports wearing seatbelt when in vehicle.    Sun Exposure/Protection: Pt reports wearing sun screen/hat and is under care of dermatologist.   Hobbies: boating, vacationing   One story home.  Past Surgical History:  Procedure Laterality Date  . ANTERIOR CRUCIATE LIGAMENT REPAIR  5/98   Left, staph infection complicated  . BASAL CELL CARCINOMA EXCISION  02/2005   R ear  . BLEPHAROPLASTY  03/2005   with ptosis repair  . cataract surgery Left 2018  . INGUINAL HERNIA REPAIR Right 1947  . INGUINAL HERNIA REPAIR Left 1970   Past Medical History:  Diagnosis Date  . Abdominal ultrasound, abnormal 02/2004   fatty liver  . Angioedema 07/13/2014  . Atypical chest pain 12/14/2013  . Cellulitis and abscess 07/14/2015  . Diabetes mellitus without complication (HCC)   . DIASTASIS RECTI 12/08/2007   Qualifier: Diagnosis of  By: Sheffield Slider MD, [facility]  Present, but causing no problems   . Elbow dislocation [id number]   Right  . Encounter for diagnostic endoscopy 10/22/04   negative  . History of esophagogastroduodenoscopy 08/13/2007   normal  . Treadmill stress test negative for  angina pectoris 06/2002   poor HR and BP recovery  . ULNAR NERVE ENTRAPMENT, RIGHT 06/07/2008   Qualifier: Diagnosis of  By: Sheffield Slider MD, [place]     BP (!) 148/81   Pulse 78   SpO2 98%   Opioid Risk Score:   Fall Risk Score:  `1  Depression screen PHQ 2/9  Depression screen [facility] 2/9 03/05/2017 01/23/2017 01/20/2017 01/01/2017 09/26/2016 07/05/2016 05/23/2016  Decreased Interest 0 0 0 0 0 0 0  Down, Depressed, Hopeless 0 0 0 0 0 0 0  PHQ - 2 Score 0 0 0 0 0 0 0  Altered sleeping - - - - - - -  Tired, decreased energy - - - - - - -  Change in appetite - - - - - - -  Feeling bad or failure about yourself  - - - - - - -  Trouble concentrating - - - - - - -  Moving slowly or fidgety/restless - - - - - - -  Suicidal thoughts - - - - - - -  PHQ-9 Score - - - - - - -  Difficult doing work/chores - - - - - - -  Some recent data might be hidden   Review of Systems  Constitutional: Negative.   HENT: Negative.   Eyes: Negative.   Respiratory: Negative.   Cardiovascular: Negative.   Gastrointestinal: Negative.   Endocrine: Negative.   Genitourinary: Negative.   Musculoskeletal: Negative.   Skin: Negative.   Allergic/Immunologic: Negative.   Neurological: Negative.   Hematological: Negative.   Psychiatric/Behavioral: Negative.   All other systems reviewed and are negative.     Objective:   Physical Exam Constitutional: He appears well-developed and well-nourished. NAD.   HENT:  Normocephalic and atraumatic.    Eyes: EOMI. No discharge.  Cardiovascular: RRR. No JVD.Marland Kitchen    Respiratory: Effort normal and breath sounds normal.  GI: Soft. Bowel sounds are normal.   Musculoskeletal: He exhibits no edema or tenderness.  Gait: Slight stepage gait b/l.   Neurological: He is alert and oriented.  Mild ptosis on left -chronic.   Motor: 5/5 B/l UE proximal to distal RLE: 5/5 proximally, 5/5 ankle dorsiflexion LLE: 5/5 proximally, 5/5 ankle dorsiflexion Skin: Skin is warm and dry. He is not  diaphoretic.  Psychiatric: He has a normal mood and affect. His behavior is normal.     Assessment & Plan:  76 y.o. male with history of DMT2 with neuropathy presents for follow up for GBS.    1. Guillain Barre Syndrome: Resolved  Cont HEP  Cont follow up with Neurology  2. Peripheral Neuropathy due to DM  Cont Gabapentin, being managed by Neuro  3. Thrombocytopenia  Labs reviewed, platelets remain low in 03/2016   4. Neurologic gait: Improved  Cont excercises

## 2017-04-13 ENCOUNTER — Other Ambulatory Visit: Payer: Self-pay | Admitting: Family Medicine

## 2017-04-18 ENCOUNTER — Telehealth: Payer: Self-pay | Admitting: Family Medicine

## 2017-04-18 MED ORDER — HYDROCORTISONE 2.5 % RE CREA
1.0000 | TOPICAL_CREAM | Freq: Two times a day (BID) | RECTAL | 0 refills | Status: DC
Start: 2017-04-18 — End: 2018-04-01

## 2017-04-18 NOTE — Telephone Encounter (Signed)
Has a problem with hemorroids. OTC is not working.  He would like to have sometype of salve called in [facility] [employer] [address]

## 2017-04-18 NOTE — Telephone Encounter (Signed)
[facility]

## 2017-05-21 ENCOUNTER — Other Ambulatory Visit: Payer: Self-pay | Admitting: Family Medicine

## 2017-05-27 ENCOUNTER — Encounter: Payer: Self-pay | Admitting: Family Medicine

## 2017-06-16 ENCOUNTER — Other Ambulatory Visit: Payer: Self-pay

## 2017-06-17 MED ORDER — GABAPENTIN 300 MG PO CAPS: 900.0000 mg | ORAL_CAPSULE | Freq: Three times a day (TID) | ORAL | 0 refills | Status: DC

## 2017-06-19 ENCOUNTER — Other Ambulatory Visit: Payer: Self-pay | Admitting: *Deleted

## 2017-06-19 MED ORDER — GABAPENTIN 300 MG PO CAPS: 900.0000 mg | ORAL_CAPSULE | Freq: Three times a day (TID) | ORAL | 1 refills | Status: DC

## 2017-06-19 NOTE — Telephone Encounter (Signed)
Pharmacy requesting a 90-day supply. Deseree Bruna PotterBlount, CMA

## 2017-07-01 ENCOUNTER — Ambulatory Visit: Payer: Medicare Other | Admitting: Family Medicine

## 2017-07-01 ENCOUNTER — Other Ambulatory Visit: Payer: Self-pay

## 2017-07-01 ENCOUNTER — Encounter: Payer: Self-pay | Admitting: Family Medicine

## 2017-07-01 VITALS — BP 122/68 | HR 81 | Temp 98.0°F | Ht 67.0 in | Wt 163.4 lb

## 2017-07-01 DIAGNOSIS — Z794 Long term (current) use of insulin: Secondary | ICD-10-CM | POA: Diagnosis not present

## 2017-07-01 DIAGNOSIS — G610 Guillain-Barre syndrome: Secondary | ICD-10-CM

## 2017-07-01 DIAGNOSIS — E7800 Pure hypercholesterolemia, unspecified: Secondary | ICD-10-CM | POA: Diagnosis not present

## 2017-07-01 DIAGNOSIS — I10 Essential (primary) hypertension: Secondary | ICD-10-CM | POA: Diagnosis not present

## 2017-07-01 DIAGNOSIS — E0821 Diabetes mellitus due to underlying condition with diabetic nephropathy: Secondary | ICD-10-CM | POA: Diagnosis not present

## 2017-07-01 LAB — POCT GLYCOSYLATED HEMOGLOBIN (HGB A1C): Hemoglobin A1C: 6.5

## 2017-07-01 NOTE — Assessment & Plan Note (Signed)
Stable check labs  

## 2017-07-01 NOTE — Assessment & Plan Note (Signed)
Sees Dr Allena KatzPatel.  Able to ambulate but feels his balance is not really good

## 2017-07-01 NOTE — Assessment & Plan Note (Signed)
Well controlled 

## 2017-07-01 NOTE — Progress Notes (Signed)
Subjective  Patient is presenting with the following illnesses  HYPERTENSION Disease Monitoring: Blood pressure range-usually at goal Chest pain, palpitations- no      Dyspnea- no Medications: Compliance- brings in all his meds Lightheadedness,Syncope- no   Edema- no  DIABETES Disease Monitoring: Blood Sugar ranges-usually good Polyuria/phagia/dipsia- no      Visual problems- no Medications: Compliance- good Hypoglycemic symptoms- no  HYPERLIPIDEMIA Disease Monitoring: See symptoms for Hypertension Medications: Compliance- lipitor daily Right upper quadrant pain- no Muscle aches- no  Monitoring Labs and Parameters Last A1C:  Lab Results  Component Value Date   HGBA1C 6.5 07/01/2017    Last Lipid:     Component Value Date/Time   CHOL 95 03/28/2016 0933   HDL 27 (L) 03/28/2016 0933   LDLDIRECT 41 06/16/2012 1522    Last Bmet  Potassium  Date Value Ref Range Status  09/26/2016 4.2 3.5 - 5.2 mmol/L Final   Sodium  Date Value Ref Range Status  09/26/2016 141 134 - 144 mmol/L Final   Creat  Date Value Ref Range Status  03/28/2016 1.86 (H) 0.70 - 1.18 mg/dL Final    Comment:      For patients > or = 75 years of age: The upper reference limit for Creatinine is approximately 13% higher for people identified as African-American.      Creatinine, Ser  Date Value Ref Range Status  09/26/2016 1.53 (H) 0.76 - 1.27 mg/dL Final      Last BPs:  BP Readings from Last 3 Encounters:  07/01/17 122/68  04/11/17 (!) 148/81  03/24/17 138/78     Chief Complaint noted Review of Symptoms - see HPI PMH - Smoking status noted.     Objective Vital Signs reviewed Diabetic Foot Check -  Appearance - no lesions, ulcers or calluses Skin - no unusual pallor or redness Monofilament testing -  Right - Great toe, medial, central, lateral ball and posterior foot intact Left - Great toe, medial, central, lateral ball and posterior foot intact  Left lower leg - black eschar  approx  1 cm over excise site.  No redness or discharge   Assessments/Plans  Diabetes mellitus (HCC) Well controlled   Essential hypertension, benign Well controlled   Pure hypercholesterolemia Stable check labs   Guillain Barr syndrome [facility](HCC) Sees Dr Allena KatzPatel.  Able to ambulate but feels his balance is not really good    See after visit summary for details of patient instuctions

## 2017-07-01 NOTE — Patient Instructions (Addendum)
Good to see you today!  Thanks for coming in.  I will call you if your tests are not good.  Otherwise I will send you a letter.  If you do not hear from me with in 2 weeks please call our office.     For the Leg Lesion  vaseline twice a day and go back and see your dermatologist  For your Feet  Use a moisturizer at night  Have fun   Come back in 6 months unless there is a problem

## 2017-07-02 ENCOUNTER — Encounter: Payer: Self-pay | Admitting: Family Medicine

## 2017-07-02 LAB — CMP14+EGFR
ALT: 27 IU/L (ref 0–44)
AST: 19 IU/L (ref 0–40)
Albumin/Globulin Ratio: 2.4 — ABNORMAL HIGH (ref 1.2–2.2)
Albumin: 4.7 g/dL (ref 3.5–4.8)
Alkaline Phosphatase: 81 IU/L (ref 39–117)
BUN/Creatinine Ratio: 9 — ABNORMAL LOW (ref 10–24)
BUN: 15 mg/dL (ref 8–27)
Bilirubin Total: 0.8 mg/dL (ref 0.0–1.2)
CO2: 22 mmol/L (ref 20–29)
Calcium: 9.1 mg/dL (ref 8.6–10.2)
Chloride: 105 mmol/L (ref 96–106)
Creatinine, Ser: 1.66 mg/dL — ABNORMAL HIGH (ref 0.76–1.27)
GFR calc Af Amer: 46 mL/min/{1.73_m2} — ABNORMAL LOW (ref >59)
GFR calc non Af Amer: 40 mL/min/{1.73_m2} — ABNORMAL LOW (ref >59)
Globulin, Total: 2 g/dL (ref 1.5–4.5)
Glucose: 142 mg/dL — ABNORMAL HIGH (ref 65–99)
Potassium: 4 mmol/L (ref 3.5–5.2)
Sodium: 142 mmol/L (ref 134–144)
Total Protein: 6.7 g/dL (ref 6.0–8.5)

## 2017-07-02 LAB — LIPID PANEL
Chol/HDL Ratio: 3.3 ratio (ref 0.0–5.0)
Cholesterol, Total: 93 mg/dL — ABNORMAL LOW (ref 100–199)
HDL: 28 mg/dL — ABNORMAL LOW (ref >39)
LDL Calculated: 15 mg/dL (ref 0–99)
Triglycerides: 251 mg/dL — ABNORMAL HIGH (ref 0–149)
VLDL Cholesterol Cal: 50 mg/dL — ABNORMAL HIGH (ref 5–40)

## 2017-07-02 LAB — CBC
Hematocrit: 34.5 % — ABNORMAL LOW (ref 37.5–51.0)
Hemoglobin: 11.9 g/dL — ABNORMAL LOW (ref 13.0–17.7)
MCH: 32.3 pg (ref 26.6–33.0)
MCHC: 34.5 g/dL (ref 31.5–35.7)
MCV: 94 fL (ref 79–97)
Platelets: 120 10*3/uL — ABNORMAL LOW (ref 150–379)
RBC: 3.68 x10E6/uL — ABNORMAL LOW (ref 4.14–5.80)
RDW: 15.1 % (ref 12.3–15.4)
WBC: 4.4 10*3/uL (ref 3.4–10.8)

## 2017-07-11 ENCOUNTER — Other Ambulatory Visit: Payer: Self-pay | Admitting: Family Medicine

## 2017-07-13 ENCOUNTER — Other Ambulatory Visit: Payer: Self-pay | Admitting: Family Medicine

## 2017-07-16 ENCOUNTER — Other Ambulatory Visit: Payer: Self-pay | Admitting: Family Medicine

## 2017-07-25 ENCOUNTER — Other Ambulatory Visit: Payer: Self-pay | Admitting: Family Medicine

## 2017-10-22 ENCOUNTER — Other Ambulatory Visit: Payer: Self-pay | Admitting: Family Medicine

## 2017-10-22 ENCOUNTER — Encounter: Payer: Self-pay | Admitting: Family Medicine

## 2017-11-08 ENCOUNTER — Other Ambulatory Visit: Payer: Self-pay | Admitting: Family Medicine

## 2017-12-10 ENCOUNTER — Encounter: Payer: Self-pay | Admitting: Family Medicine

## 2017-12-10 ENCOUNTER — Ambulatory Visit: Payer: Medicare Other | Admitting: Family Medicine

## 2017-12-10 DIAGNOSIS — I10 Essential (primary) hypertension: Secondary | ICD-10-CM

## 2017-12-10 DIAGNOSIS — Z794 Long term (current) use of insulin: Secondary | ICD-10-CM | POA: Diagnosis not present

## 2017-12-10 DIAGNOSIS — E0821 Diabetes mellitus due to underlying condition with diabetic nephropathy: Secondary | ICD-10-CM

## 2017-12-10 DIAGNOSIS — E7800 Pure hypercholesterolemia, unspecified: Secondary | ICD-10-CM

## 2017-12-10 DIAGNOSIS — E1122 Type 2 diabetes mellitus with diabetic chronic kidney disease: Secondary | ICD-10-CM | POA: Diagnosis not present

## 2017-12-10 DIAGNOSIS — N183 Chronic kidney disease, stage 3 (moderate): Secondary | ICD-10-CM

## 2017-12-10 LAB — POCT GLYCOSYLATED HEMOGLOBIN (HGB A1C): HbA1c, POC (controlled diabetic range): 7.1 % — AB (ref 0.0–7.0)

## 2017-12-10 NOTE — Assessment & Plan Note (Signed)
Lab Results  Component Value Date   LDLCALC 15 07/01/2017   At goal

## 2017-12-10 NOTE — Assessment & Plan Note (Signed)
At goal.  

## 2017-12-10 NOTE — Progress Notes (Signed)
Subjective  Tanner Mason is a 77 y.o. male is presenting with the following  HYPERTENSION Disease Monitoring: Blood pressure range-usually good Chest pain, palpitations- no      Dyspnea- no Medications: Compliance- brings all his meds Lightheadedness,Syncope- no   Edema- mild in evenings  DIABETES Disease Monitoring: Blood Sugar ranges-usually well controlled Polyuria/phagia/dipsia- no      Visual problems- no Medications: Compliance- misses some of his humalog injections Hypoglycemic symptoms- no  HYPERLIPIDEMIA Disease Monitoring: See symptoms for Hypertension Medications: Compliance- lipitor Right upper quadrant pain- no  Muscle aches- no  Monitoring Labs and Parameters Last A1C:  Lab Results  Component Value Date   HGBA1C 7.1 (A) 12/10/2017    Last Lipid:     Component Value Date/Time   CHOL 93 (L) 07/01/2017 1038   HDL 28 (L) 07/01/2017 1038   LDLDIRECT 41 06/16/2012 1522    Last Bmet  Potassium  Date Value Ref Range Status  07/01/2017 4.0 3.5 - 5.2 mmol/L Final   Sodium  Date Value Ref Range Status  07/01/2017 142 134 - 144 mmol/L Final   Creat  Date Value Ref Range Status  03/28/2016 1.86 (H) 0.70 - 1.18 mg/dL Final    Comment:      For patients > or = 75 years of age: The upper reference limit for Creatinine is approximately 13% higher for people identified as African-American.      Creatinine, Ser  Date Value Ref Range Status  07/01/2017 1.66 (H) 0.76 - 1.27 mg/dL Final      Last BPs:  BP Readings from Last 3 Encounters:  07/01/17 122/68  04/11/17 (!) 148/81  03/24/17 138/78      Chief Complaint noted Review of Symptoms - see HPI PMH - Smoking status noted.    Objective Vital Signs reviewed There were no vitals taken for this visit. Heart - Regular rate and rhythm.  No murmurs, gallops or rubs.    Lungs:  Normal respiratory effort, chest expands symmetrically. Lungs are clear to auscultation, no crackles or  wheezes. Extremities:  No cyanosis, edema, or deformity noted with good range of motion of all major joints.    Assessments/Plans  See after visit summary for details of patient instuctions  Diabetes mellitus (HCC) Well controlled but rising A1c - monitor   Essential hypertension, benign At goal   Pure hypercholesterolemia Lab Results  Component Value Date   LDLCALC 15 07/01/2017   At goal

## 2017-12-10 NOTE — Patient Instructions (Addendum)
Good to see you today!  Thanks for coming in.  See Dr Randa EvensEdwards - for the colonoscopy  I will look in to your imaging test and let you know about the next one If you don't hear from me in 2 weeks then contact me  Try the omeprazole twice a week  Come back in 3 months

## 2017-12-10 NOTE — Assessment & Plan Note (Signed)
Well controlled but rising A1c - monitor

## 2017-12-11 ENCOUNTER — Other Ambulatory Visit: Payer: Self-pay | Admitting: Family Medicine

## 2017-12-11 ENCOUNTER — Encounter: Payer: Self-pay | Admitting: Family Medicine

## 2017-12-29 IMAGING — MR MR LUMBAR SPINE W/O CM
4 of 5 series · 17 of 48 positions shown · non-contrast
Comparison: Prior study from 04/13/2014.

CLINICAL DATA: Initial evaluation for worsening numbness and
weakness in legs. Patient with suspected Guillain-Barre syndrome.

EXAM:
MRI LUMBAR SPINE WITHOUT CONTRAST
TECHNIQUE: Multiplanar, multisequence MR imaging of the lumbar spine was
performed. No intravenous contrast was administered.

[Series 3: T2 · sagittal · 4.0mm · 0.55mm/px · 5 of 12 slices shown (1 of 2)]
[im 1/12]
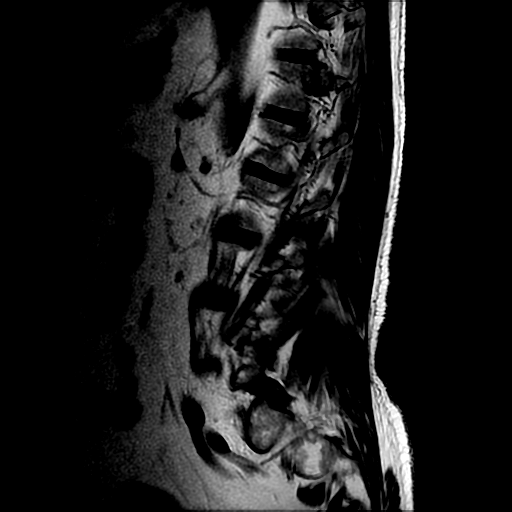
[im 3/12]
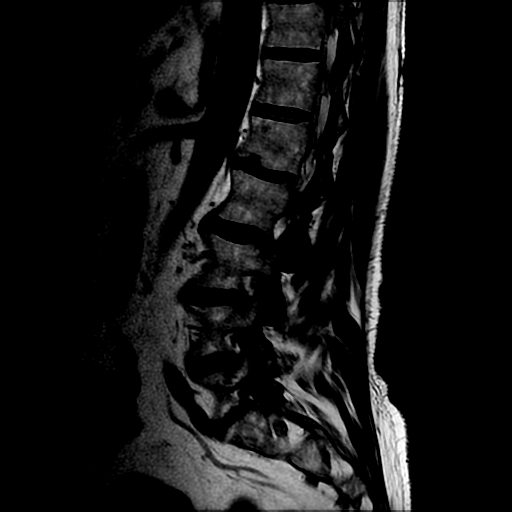
[im 6/12]
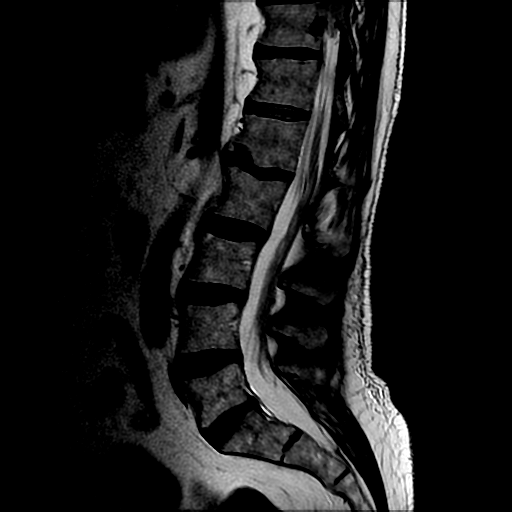
[im 9/12]
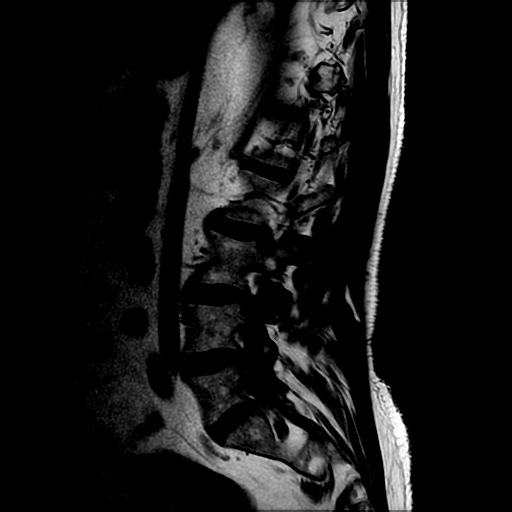
[im 12/12]
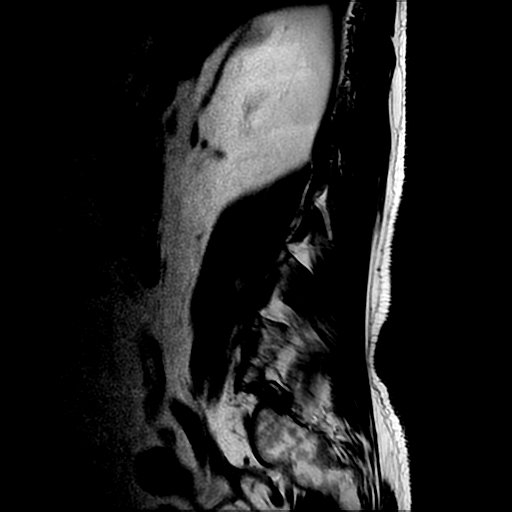

[Series 4: T1 · sagittal · 4.0mm · 0.55mm/px · 3 of 12 slices shown (1 of 2)]
[im 1/12]
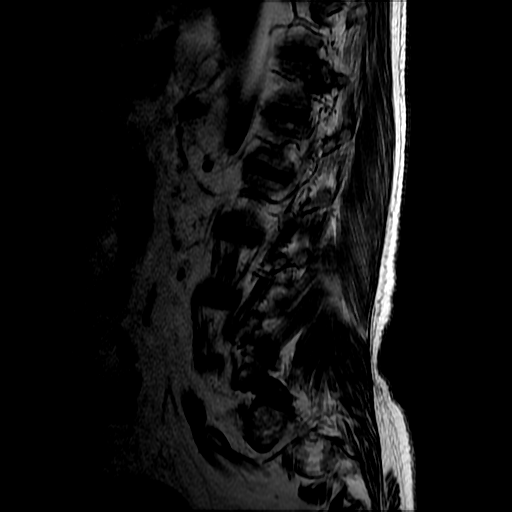
[im 6/12]
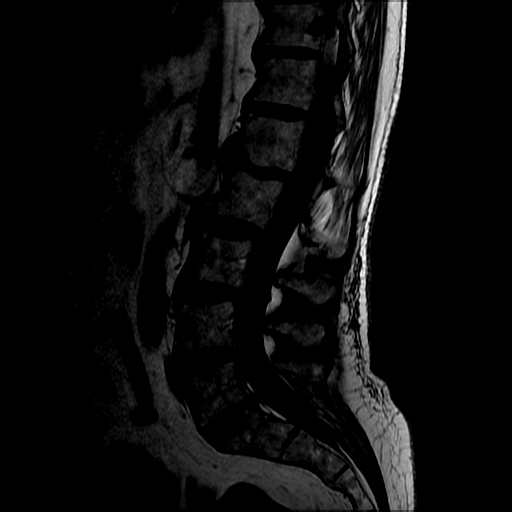
[im 12/12]
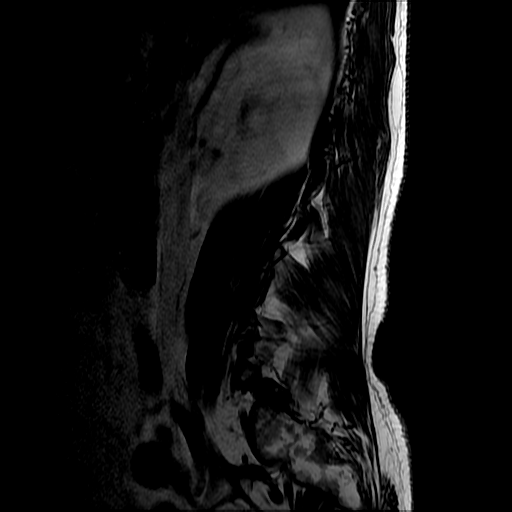

[Series 6: T2 · axial · 4.0mm · 0.39mm/px · z∈[-76,+89]mm · 6 of 33 slices shown (2 of 2)]
[im 3/33]
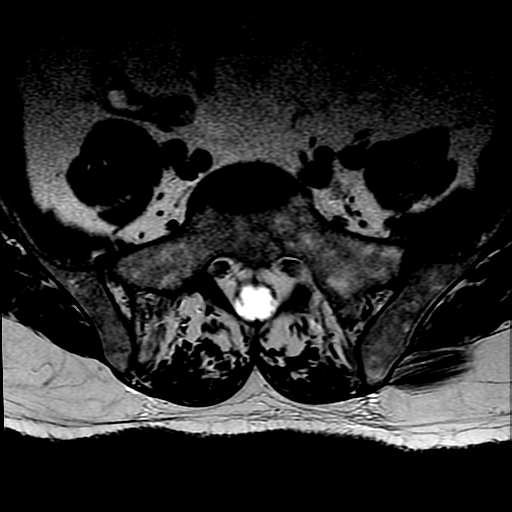
[im 5/33]
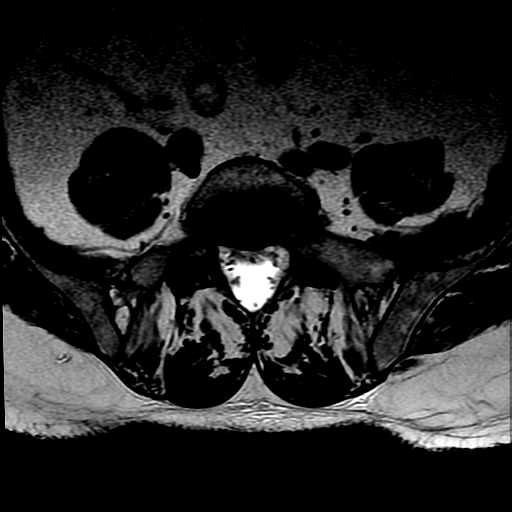
[im 7/33]
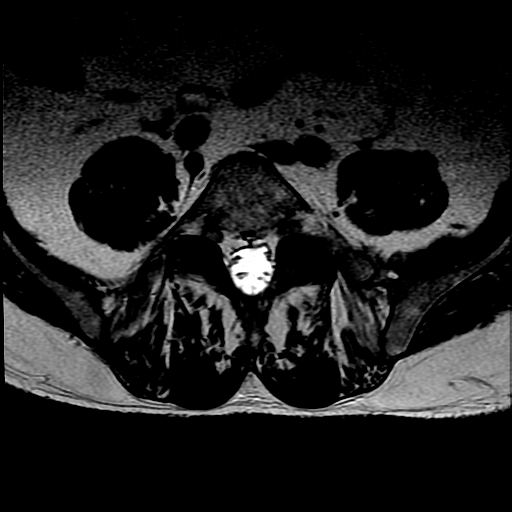
[im 11/33]
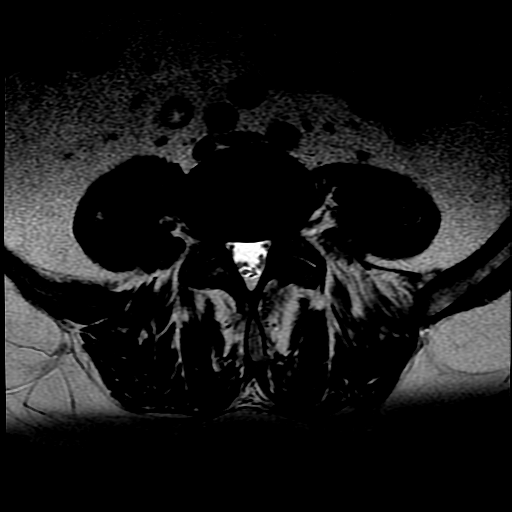
[im 18/33]
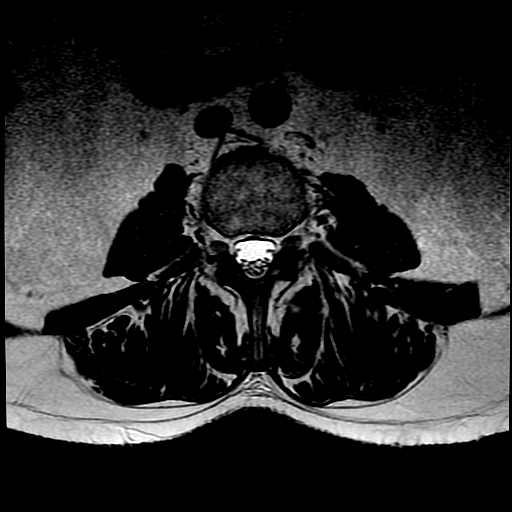
[im 28/33]
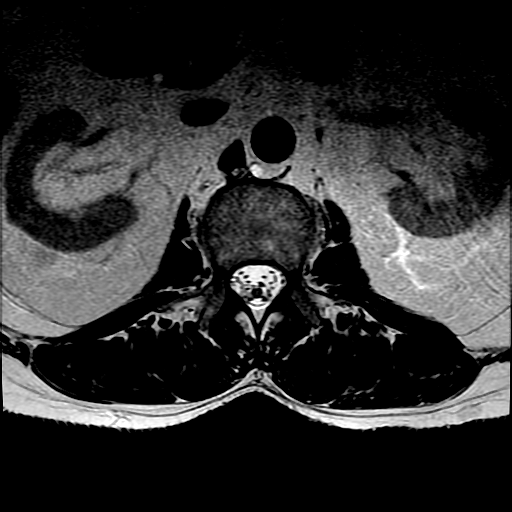

[Series 7: T1 · axial · 4.0mm · 0.39mm/px · z∈[-67,+89]mm · 3 of 33 slices shown (2 of 2)]
[im 5/33]
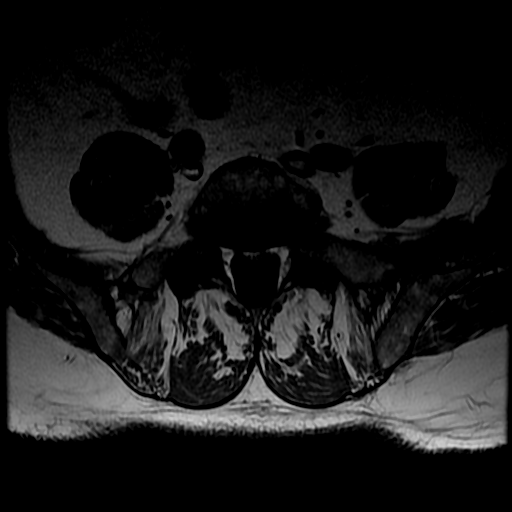
[im 18/33]
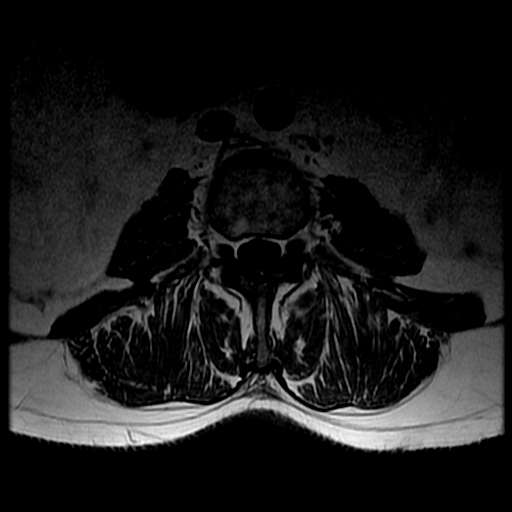
[im 28/33]
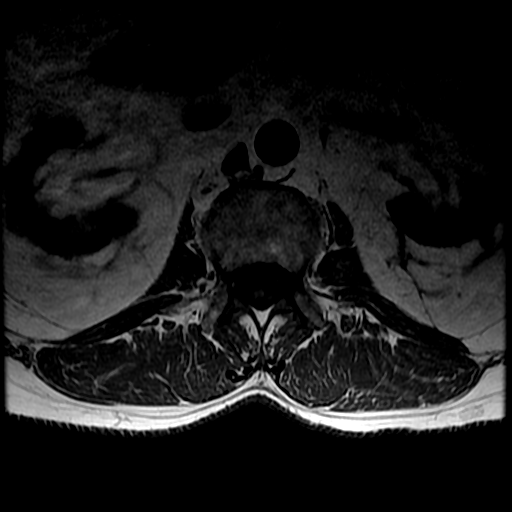

[17 of 48 positions shown; findings below may reference images not displayed]

FINDINGS: Segmentation:  Normal segmentation.

Alignment: Vertebral bodies are normally aligned with preservation
of the normal lumbar lordosis. No listhesis or malalignment.

Vertebrae: Vertebral body heights well maintained. No acute or
chronic fracture. Signal intensity within the vertebral body bone
marrow is somewhat patchy and heterogeneous without focal osseous
lesion. The is most commonly related to obesity, smoking, or chronic
disease. This is stable from prior. No marrow edema.

Conus medullaris: Extends to the L2 level and appears normal. Nerve
roots of the cauda equina within normal limits.

Paraspinal and other soft tissues: Paraspinous soft tissues
demonstrate no acute abnormality. No retroperitoneal adenopathy.
Visualized visceral structures within normal limits.

Disc levels:

T11-12: Seen only on sagittal projection. Disc desiccation without
disc bulge. No stenosis.

T12-L1: Seen only on sagittal projection. No disc bulge or disc
protrusion. No stenosis.

L1-2: Degenerative disc desiccation without significant disc bulge.
Chronic endplate Schmorl's node at the inferior endplate of L1. No
significant canal or foraminal stenosis.

L2-3: Mild diffuse degenerative disc bulge without focal disc
herniation. No focal disc herniation. Mild ligamentum flavum
hypertrophy. No significant canal or foraminal stenosis.

L3-4: Mild diffuse disc bulge with disc desiccation. Disc bulging is
slightly eccentric to the left. Mild bilateral facet arthrosis with
ligamentum flavum thickening. Minimal canal stenosis. Mild left
foraminal narrowing. Again seen is mild effacement of the fat
adjacent to the L5 nerve root, stable which may reflect a small
sequestered disc fragment (series 7, image 25). This could
potentially affect the transiting left L5 nerve root. This is
similar to prior.

L4-5: Disc desiccation with broad-based posterior disc bulge.
Associated annular fissure posteriorly. Bilateral facet arthrosis
with ligamentum flavum thickening. No significant canal stenosis.
Mild to moderate bilateral foraminal narrowing, slightly worse on
the left.

L5-S1: The mild disc bulge with disc desiccation. Superimposed
shallow central disc protrusion with associated annular fissure.
Mild bilateral facet arthrosis. No significant canal stenosis. Mild
to mild bilateral foraminal narrowing.
IMPRESSION: 1. Overall stable appearance of the lumbar spine as compared to
previous MRI from 04/13/2014. No significant canal stenosis. No
evidence for cord compression.
2. Similar effacement of the fat around the descending left L5 nerve
root in the left L4-5 lateral recess, again suspicious for a small
sequestered disc fragment. This could potentially affect the
transiting left L5 nerve root. This is similar to prior.
3. Mild to moderate bilateral foraminal stenosis at L4-5 and L5-S1,
stable.

## 2018-01-01 ENCOUNTER — Other Ambulatory Visit: Payer: Self-pay | Admitting: Family Medicine

## 2018-01-03 IMAGING — CR DG LUMBAR SPINE 2-3V
3 series · 3 of 3 positions shown · non-contrast
Comparison: None.

CLINICAL DATA: Low back pain for 2 days

EXAM:
LUMBAR SPINE - 2-3 VIEW

[l-spine ap]
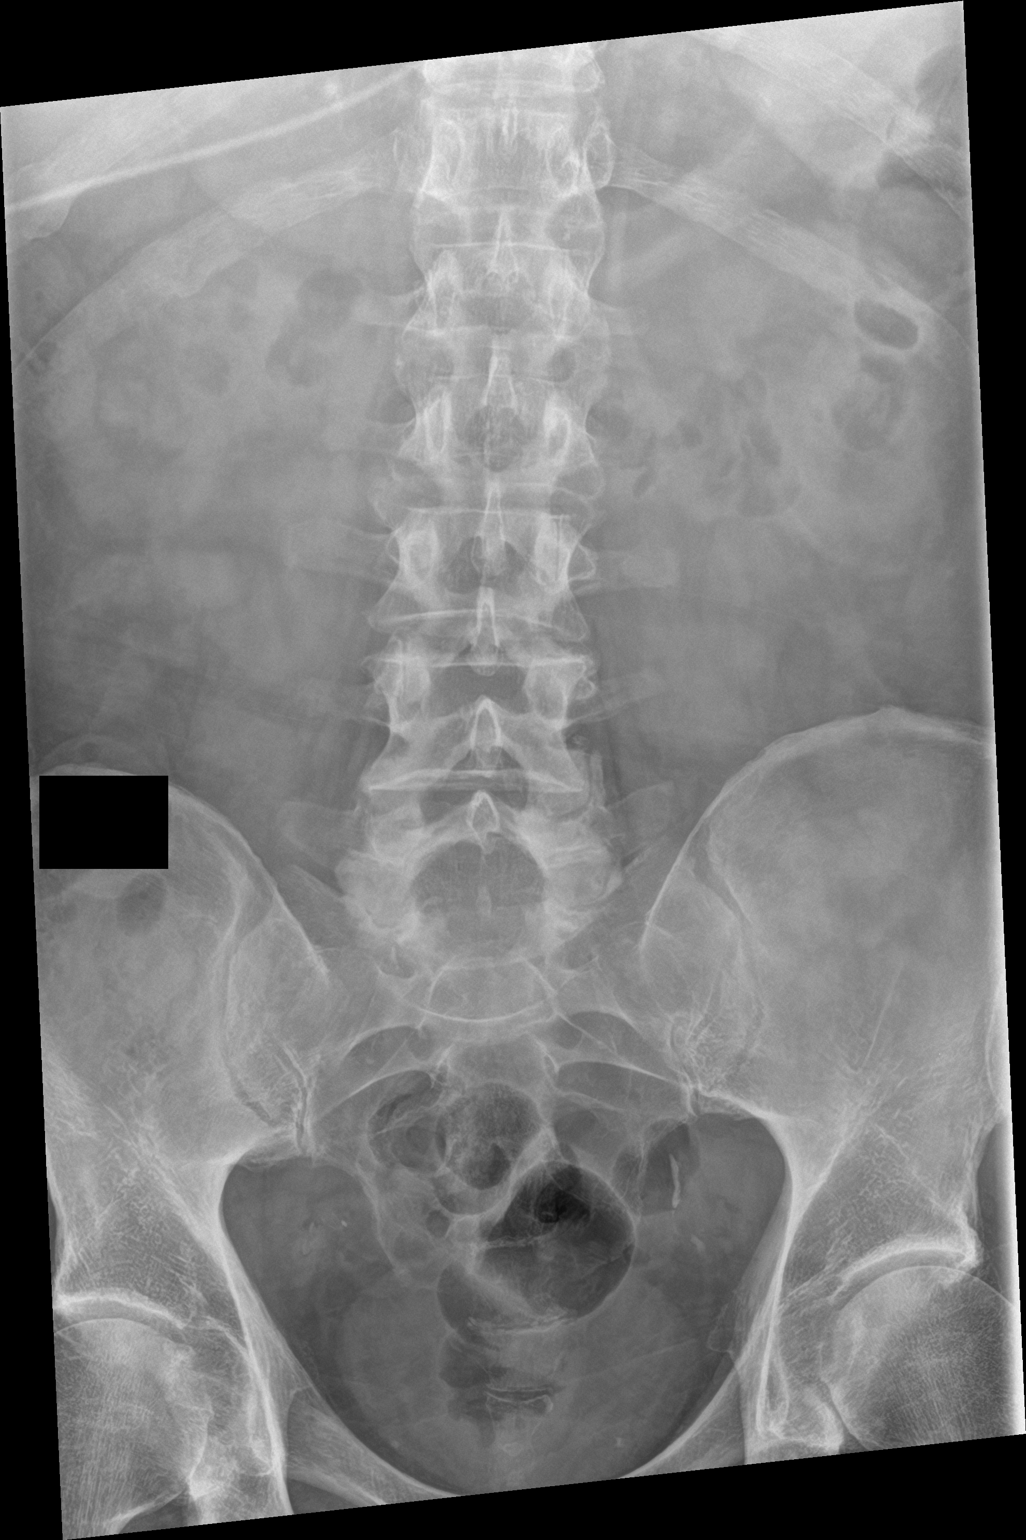

[l-spine lat]
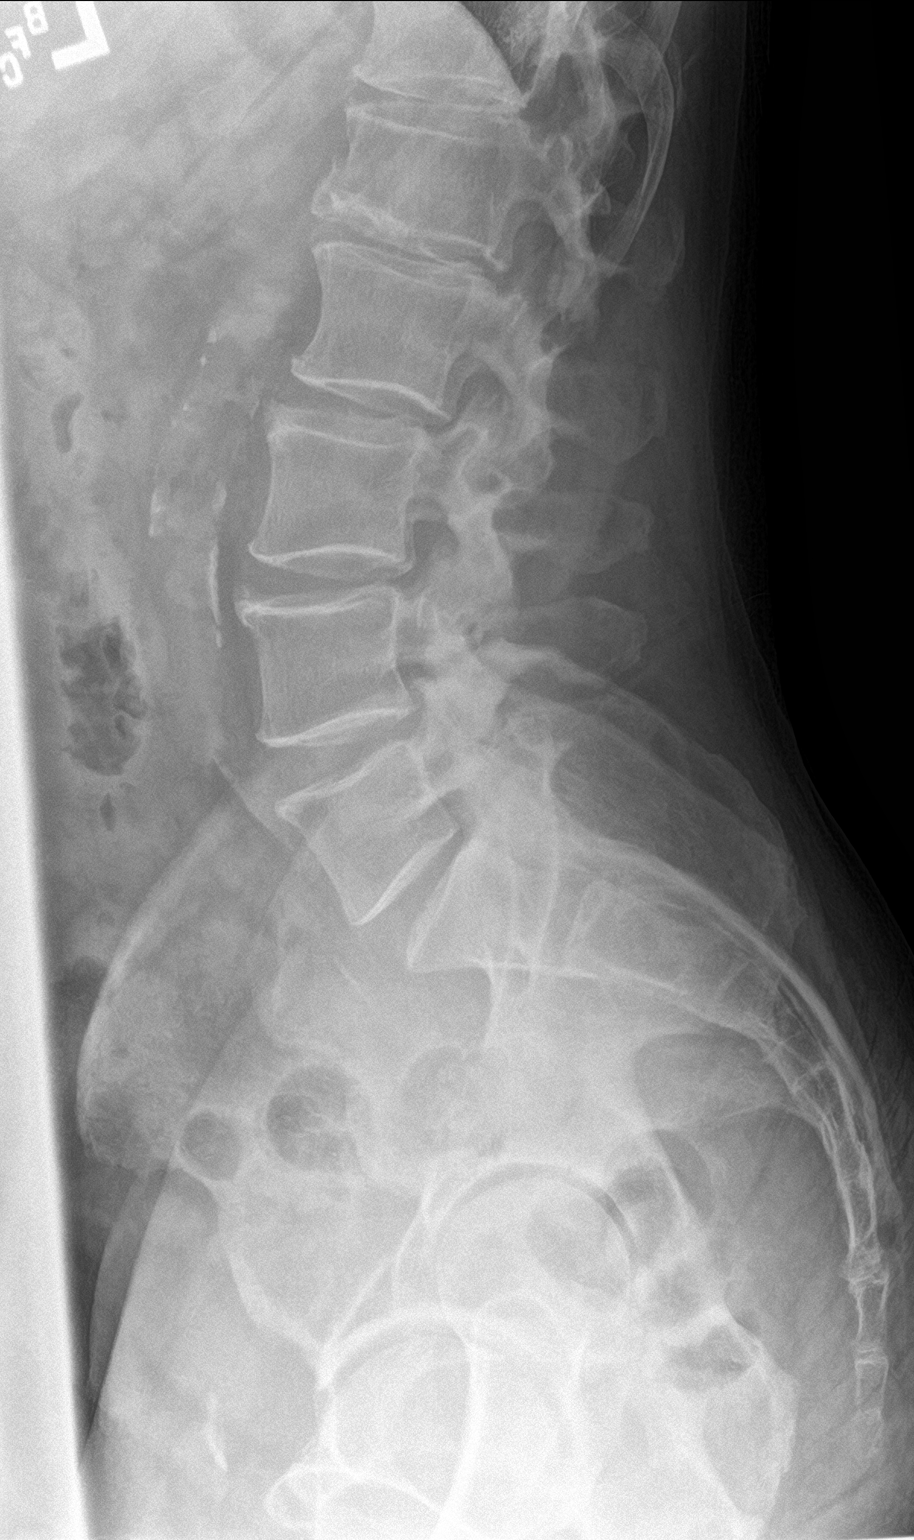

[l-spine spot]
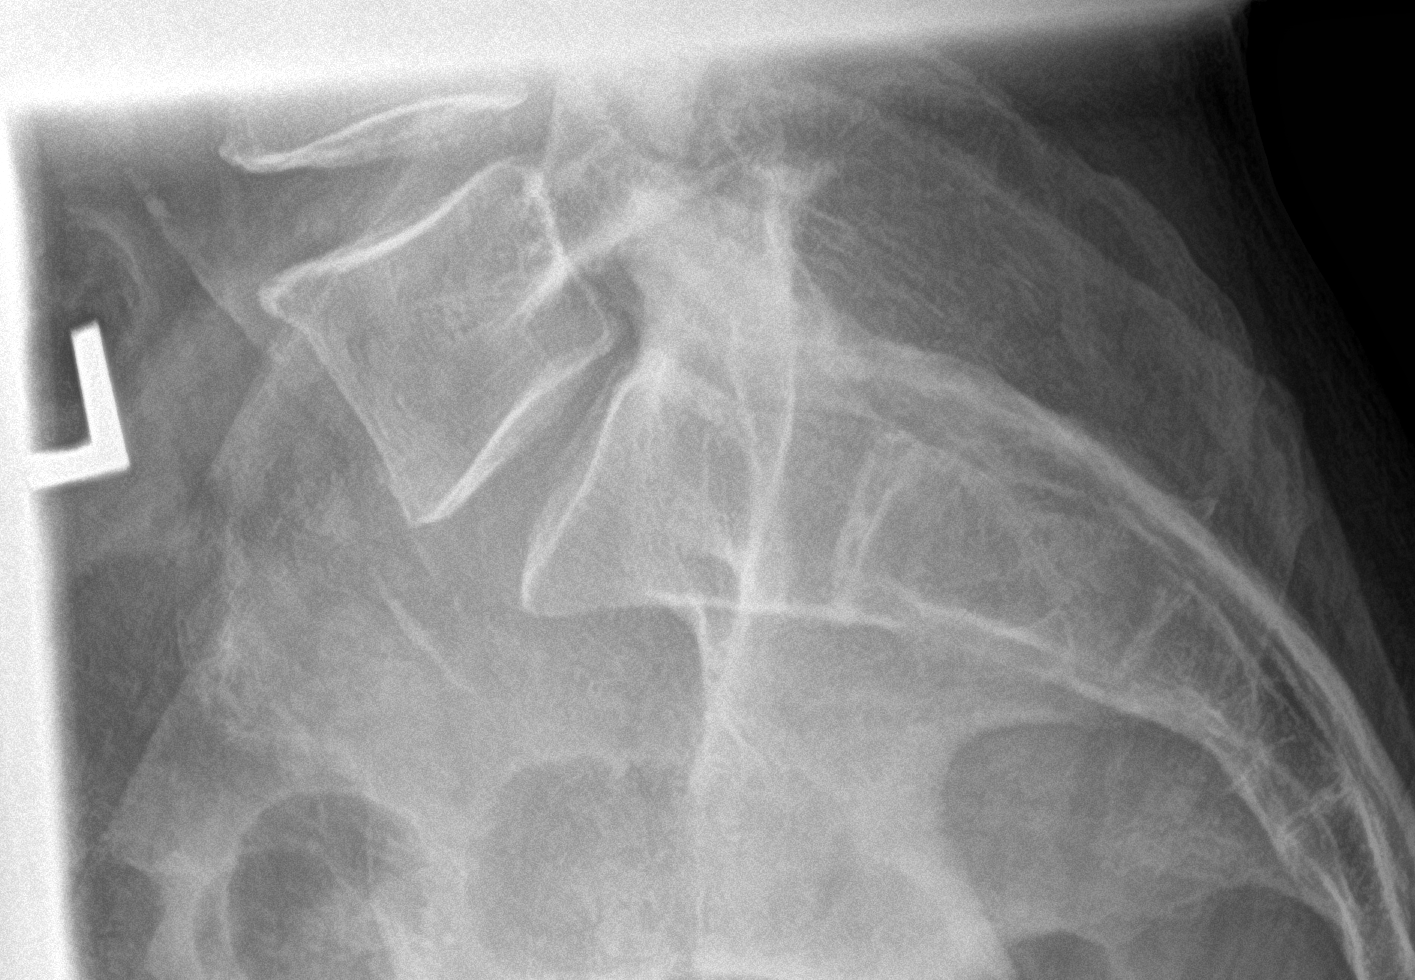

[3 of 3 positions shown; findings below may reference images not displayed]

FINDINGS: There is no evidence of lumbar spine fracture. Alignment is normal.
Degenerative disc disease with disc height loss at L1-2. Bilateral
facet arthropathy at L4-5 and L5-S1.

Abdominal aortic atherosclerosis.
IMPRESSION: No acute osseous injury of the lumbar spine.

## 2018-01-04 ENCOUNTER — Other Ambulatory Visit: Payer: Self-pay | Admitting: Family Medicine

## 2018-01-04 DIAGNOSIS — Z794 Long term (current) use of insulin: Secondary | ICD-10-CM

## 2018-01-04 DIAGNOSIS — E0821 Diabetes mellitus due to underlying condition with diabetic nephropathy: Secondary | ICD-10-CM

## 2018-03-05 ENCOUNTER — Encounter: Payer: Self-pay | Admitting: Family Medicine

## 2018-03-08 ENCOUNTER — Other Ambulatory Visit: Payer: Self-pay | Admitting: Family Medicine

## 2018-03-11 ENCOUNTER — Other Ambulatory Visit: Payer: Self-pay | Admitting: Family Medicine

## 2018-03-15 ENCOUNTER — Other Ambulatory Visit: Payer: Self-pay | Admitting: Family Medicine

## 2018-03-19 ENCOUNTER — Other Ambulatory Visit: Payer: Self-pay | Admitting: Family Medicine

## 2018-03-30 ENCOUNTER — Encounter: Payer: Self-pay | Admitting: Neurology

## 2018-03-30 ENCOUNTER — Ambulatory Visit: Payer: Medicare Other | Admitting: Neurology

## 2018-03-30 VITALS — BP 120/74 | HR 87 | Ht 67.0 in | Wt 170.2 lb

## 2018-03-30 DIAGNOSIS — G250 Essential tremor: Secondary | ICD-10-CM | POA: Diagnosis not present

## 2018-03-30 DIAGNOSIS — E0842 Diabetes mellitus due to underlying condition with diabetic polyneuropathy: Secondary | ICD-10-CM | POA: Diagnosis not present

## 2018-03-30 DIAGNOSIS — M21372 Foot drop, left foot: Secondary | ICD-10-CM

## 2018-03-30 MED ORDER — GABAPENTIN 300 MG PO CAPS: 600.0000 mg | ORAL_CAPSULE | Freq: Two times a day (BID) | ORAL | 3 refills | Status: DC

## 2018-03-30 NOTE — Progress Notes (Signed)
Follow-up Visit   Date: 03/30/18    Tanner Mason MRN: [id number] DOB: Apr 27, 1941   Interim History: Tanner Mason is a 77 y.o. right-handed Caucasian male with insulin-dependent diabetes mellitus c/b renal insufficiency, hypertension, basal cell carcinoma of the ear s/p resection, hyperlipidemia, and Guillian-Barre syndrome (May 2018) returning for follow-up of neuropathy.  History of present illness: In May 2017, he was visiting [facility] and suddenly developed tingling of the feet and lower legs. He was evaluated at the ER in [place] where MRI brain and lumbar spine was unremarkable.  The following day he came to [facility] and was unable to walk which prompted him to go to the [facility] ED.  By the time he arrived at the ED, his tingling and numbness involved the level of thigh.  Patient was hospitalized at [facility] from 5/21 - 09/26/2015 and work-up consistent with GBS. He completed 5 sessions of plasmapheresis. After the 3rd session, he began noticing improved paresthesias.  He was discharged to rehab facility and went home on 6/12 with a walker and out-patient PT.  He also has left foot weakness which has been present for at least the past 4 years.  He was told that he may need back surgery for this some time back, but decided to pursue it conservatively. He takes gabapentin 961m TID which controls painful paresthesias.  UPDATE 03/30/2018:  He is here for follow-up visit.  He self-tapered gabapentin 6065mtwice daily and did not notice any worsening paresthesias.  He continues to have tight-sensation sensation of the feet and lower leg, which is stable.  Over the past year, he has developed mild tremors of the hands. It does not interfere with his usual activities, except when using his tools.  No family history of tremors.   Medications:  Current Outpatient Medications on File Prior to Visit  Medication Sig Dispense Refill  . amLODipine (NORVASC) 10 MG  tablet Take 10 mg by mouth daily.    . Marland Kitchenspirin ([employer] ASPIRIN) 81 MG chewable tablet Chew 81 mg by mouth daily.      . Marland Kitchentorvastatin (LIPITOR) 40 MG tablet TAKE 1 TABLET BY MOUTH EVERY DAY 90 tablet 3  . BD PEN NEEDLE NANO U/F 32G X 4 MM MISC USE AS INSTRUCTED BY YOUR PHYSICIAN TO CHECK BLOOD SUGAR 100 each 2  . cetirizine (ZYRTEC) 10 MG tablet Take 10 mg by mouth every evening.    . Marland KitchenPIPEN 2-PAK 0.3 MG/0.3ML SOAJ injection INJECT 0.3MLS INTO THE MUSLE ONCE 2 Device 1  . finasteride (PROSCAR) 5 MG tablet TAKE 1 TABLET BY MOUTH EVERY DAY 90 tablet 1  . HUMALOG KWIKPEN 100 UNIT/ML KiwkPen INJECT 0.08 ML (8 UNITS) INTO THE SKIN THREE TIMES DAILY, TAKE 10 UNITS WITH LARGEST MEAL. 15 pen 11  . hydrocortisone (ANUSOL-HC) 2.5 % rectal cream Place 1 application rectally 2 (two) times daily. 30 g 0  . KLOR-CON M10 10 MEQ tablet TAKE 2 TABLETS BY MOUTH EVERY DAY 180 tablet 3  . LANTUS SOLOSTAR 100 UNIT/ML Solostar Pen INJECT 45 UNITS INTO THE SKIN DAILY AT 10 PM. 15 pen 3  . LANTUS SOLOSTAR 100 UNIT/ML Solostar Pen INJECT 45 UNITS INTO THE SKIN DAILY AT 10 PM. 15 pen 1  . LANTUS SOLOSTAR 100 UNIT/ML Solostar Pen INJECT 45 UNITS INTO THE SKIN DAILY AT 10 PM. 45 pen 1  . liraglutide (VICTOZA) 18 MG/3ML SOPN INJECT 1.8MG INTO THE SKIN DAILY 9 pen 11  . metoprolol tartrate (  LOPRESSOR) 25 MG tablet TAKE 3 TABS BY MOUTH 2 (TWO) TIMES DAILY AT 10 AM AND 5 PM. 540 tablet 2  . omeprazole (PRILOSEC) 20 MG capsule TAKE 1 CAPSULE BY MOUTH EVERY DAY 90 capsule 3  . ONE TOUCH ULTRA TEST test strip USE TO TEST BLOOD SUGAR THREE TIMES DAILY. ICD-10 CODE: E11.9. 300 each 2  . predniSONE (DELTASONE) 10 MG tablet Take one tablet as needed for tongue swelling. 10 tablet 1  . tamsulosin (FLOMAX) 0.4 MG CAPS capsule Take 1 capsule (0.4 mg total) by mouth daily. 90 capsule 3  . traMADol (ULTRAM) 50 MG tablet TAKE 1 TABLET BY MOUTH EVERY 6 HOURS AS NEEDED 30 tablet 0   No current facility-administered medications on  file prior to visit.     Allergies:  Allergies  Allergen Reactions  . Ace Inhibitors Other (See Comments)    REACTION: Angioedema  . Calcium Carbonate Antacid Other (See Comments)    REACTION: Angioedema of mouth    Review of Systems:  CONSTITUTIONAL: No fevers, chills, night sweats, or weight loss.  EYES: No visual changes or eye pain ENT: No hearing changes.  No history of nose bleeds.   RESPIRATORY: No cough, wheezing and shortness of breath.   CARDIOVASCULAR: Negative for chest pain, and palpitations.   GI: Negative for abdominal discomfort, blood in stools or black stools.  No recent change in bowel habits.   GU:  No history of incontinence.   MUSCLOSKELETAL: No history of joint pain or swelling.  No myalgias.   SKIN: Negative for lesions, rash, and itching.   ENDOCRINE: Negative for cold or heat intolerance, polydipsia or goiter.   PSYCH:  No depression or anxiety symptoms.   NEURO: As Above.   Vital Signs:  BP 120/74   Pulse 87   Ht _0  (1.702 m)   Wt 170 lb 4 oz (77.2 kg)   SpO2 97%   BMI 26.66 kg/m   General Medical Exam:   General:  Well appearing, comfortable  Eyes/ENT: see cranial nerve examination.   Neck: No masses appreciated.  Full range of motion without tenderness.  No carotid bruits. Respiratory:  Clear to auscultation, good air entry bilaterally.   Cardiac:  Regular rate and rhythm, no murmur.   Ext:  No edema  Neurological Exam: MENTAL STATUS including orientation to time, place, person, recent and remote memory, attention span and concentration, language, and fund of knowledge is normal.  Speech is not dysarthric.  CRANIAL NERVES:  Pupils are round and and reactive. Normal conjugate, extra-ocular eye movements in all directions of gaze.  Left ptosis (old).  Face is symmetric. Palate elevates symmetrically.  Tongue is midline.  MOTOR:  Motor strength is 5/5 in all extremities, except left dorsiflexion, toe extension and eversion is 4/5.   Bilateral toe flexion is 5-/5, right toe extension is 5-/5.  Tone is normal.   Mild intention tremor of the left > right hand.    MSRs:  Right                                                                 Left brachioradialis 2+  brachioradialis 2+  biceps 2+  biceps 2+  triceps 1+  triceps 1+  patellar 0  Patellar 0  ankle jerk 0  ankle jerk 0   SENSORY: Vibration reduced at the ankles, intact at the knees. Rhomberg is positive.    COORDINATION/GAIT:  Gait appears stable, trace dragging of the left foot.  He has difficulty with heel walking on the left.  Mild unsteadiness with tandem gait.   Data: MRI lumbar spine wo contradst 09/19/2015: 1. Overall stable appearance of the lumbar spine as compared to previous MRI from 04/13/2014. No significant canal stenosis. No evidence for cord compression. 2. Similar effacement of the fat around the descending left L5 nerve root in the left L4-5 lateral recess, again suspicious for a small sequestered disc fragment. This could potentially affect the transiting left L5 nerve root. This is similar to prior. 3. Mild to moderate bilateral foraminal stenosis at L4-5 and L5-S1, stable.  NCS/EMG of the left side 05/30/2016: 1. Chronic sensorimotor polyneuropathy, predominantly axon loss in type, affecting the lower extremities; severe in degree electrically.  2. There is evidence of a superimposed chronic L5 radiculopathy on the left. 3. Left ulnar neuropathy with slowing across the elbow, demyelinating and axon loss in type.   CSF 09/18/2015:  R1  W1  G148*  P135*, IgG index 8.7, CSF cultures negative Las 09/18/2015:  CRP 0.6, vitamin B12 471, ANA neg, HIV neg, RPR, neg, TSH 1.32, HbA1c 7.4*, ESR 25 Labs 06/14/2016:  ESR 3, CRP 0.1, vitamin B12, ACE 44, folate >23.4, MMA 158, ANA neg, copper 106, vitamin B1 22, SPEP with IFE no M protein Lab Results  Component Value Date   HGBA1C 7.1 (A) 12/10/2017    IMPRESSION/PLAN: 1.  Diabetic polyneuropathy  affecting the feet - clinically stable.   He continues to have numbness and painful paresthesias of the lower legs and feet.  Pain is -controlled on gabapentin 668m BID.  He was encouraged to do balance exercises, PT was declined.    2.  Essential tremor of the hands, mild.  Symptoms are not bothersome enough to start medications.  Follow clinically.  3.  History of Guillain-Barre Syndrome (May 2017, treated with plasmapheresis).  4.  Left foot drop due to L5 radiculopathy, stable. Instructed to use AFO.   Return to clinic in 1 year     Thank you for allowing me to participate in patient's care.  If I can answer any additional questions, I would be pleased to do so.    Sincerely,    Donika K. PPosey Pronto DO

## 2018-03-30 NOTE — Patient Instructions (Signed)
Return to clinic in 1 year, or sooner as needed 

## 2018-04-01 ENCOUNTER — Ambulatory Visit: Payer: Medicare Other | Admitting: Family Medicine

## 2018-04-01 VITALS — BP 121/80 | HR 82 | Temp 97.7°F | Wt 166.6 lb

## 2018-04-01 DIAGNOSIS — E0821 Diabetes mellitus due to underlying condition with diabetic nephropathy: Secondary | ICD-10-CM | POA: Diagnosis not present

## 2018-04-01 DIAGNOSIS — Z794 Long term (current) use of insulin: Secondary | ICD-10-CM

## 2018-04-01 DIAGNOSIS — N401 Enlarged prostate with lower urinary tract symptoms: Secondary | ICD-10-CM | POA: Diagnosis not present

## 2018-04-01 DIAGNOSIS — R3915 Urgency of urination: Secondary | ICD-10-CM | POA: Diagnosis not present

## 2018-04-01 DIAGNOSIS — I10 Essential (primary) hypertension: Secondary | ICD-10-CM | POA: Diagnosis not present

## 2018-04-01 LAB — POCT GLYCOSYLATED HEMOGLOBIN (HGB A1C): HbA1c, POC (controlled diabetic range): 7.3 % — AB (ref 0.0–7.0)

## 2018-04-01 NOTE — Progress Notes (Signed)
Subjective  Tanner Mason is a 77 y.o. male is presenting with the following  URINARY URGENCY Has had for years but seems to be worse over last few months.  Can have an accident if does not get to toilet quickly.  No change in urine color or pain or bleeding.   Has been on finasteride and flomax for years.  No lightheadness   DIABETES Disease Monitoring: Blood Sugar ranges(Severity) -not checking  Associated Symptoms- Polyuria/phagia/dipsia- no      Visual problems- no Medications: Compliance(Modifying factor) - knows medications and brought in Hypoglycemic symptoms- no Timing - continuous  HYPERTENSION Disease Monitoring  Home BP Monitoring (Severity) not checking Symptoms - Chest pain- no    Dyspnea- no Medications (Modifying factors) Compliance-  daily. Lightheadedness-  no  Edema- no Timing - continuous  Duration - years ROS - See HPI   Monitoring Labs and Parameters Last A1C:  Lab Results  Component Value Date   HGBA1C 7.3 (A) 04/01/2018   Last Lipid:     Component Value Date/Time   CHOL 93 (L) 07/01/2017 1038   HDL 28 (L) 07/01/2017 1038   LDLDIRECT 41 06/16/2012 1522   Last Bmet  Potassium  Date Value Ref Range Status  07/01/2017 4.0 3.5 - 5.2 mmol/L Final   Sodium  Date Value Ref Range Status  07/01/2017 142 134 - 144 mmol/L Final   Creat  Date Value Ref Range Status  03/28/2016 1.86 (H) 0.70 - 1.18 mg/dL Final    Comment:      For patients > or = 75 years of age: The upper reference limit for Creatinine is approximately 13% higher for people identified as African-American.      Creatinine, Ser  Date Value Ref Range Status  07/01/2017 1.66 (H) 0.76 - 1.27 mg/dL Final      Chief Complaint noted Review of Symptoms - see HPI PMH - Smoking status noted.    Objective Vital Signs reviewed BP 121/80   Pulse 82   Temp 97.7 F (36.5 C)   Wt 166 lb 9.6 oz (75.6 kg)   SpO2 98%   BMI 26.09 kg/m   Assessments/Plans  See after visit summary  for details of patient instuctions  Diabetes mellitus (HCC) Good control continue current medications   Enlarged prostate with lower urinary tract symptoms (LUTS) Worsened.  Will check PSA and increase flomax   Essential hypertension, benign BP Readings from Last 3 Encounters:  04/01/18 121/80  03/30/18 120/74  07/01/17 122/68   Well controlled

## 2018-04-01 NOTE — Assessment & Plan Note (Signed)
Worsened.  Will check PSA and increase flomax

## 2018-04-01 NOTE — Assessment & Plan Note (Signed)
Good control continue current medications  

## 2018-04-01 NOTE — Assessment & Plan Note (Signed)
BP Readings from Last 3 Encounters:  04/01/18 121/80  03/30/18 120/74  07/01/17 122/68   Well controlled

## 2018-04-01 NOTE — Patient Instructions (Addendum)
Good to see you today!  Thanks for coming in.  For the Urgency Take 2 flomax tabs once a day.  If not improving or getting worse then let me know.  This could make you feel a little lightheadness.  If it does let me know  I will call you if your tests are not good.  Otherwise I will send you a letter.  If you do not hear from me with in 2 weeks please call our office.     I will send you a message about the A1c  Ask Dr Burgess Estelleanner to send me a report about your eyes

## 2018-04-01 NOTE — Progress Notes (Signed)
a1c

## 2018-04-02 ENCOUNTER — Encounter: Payer: Self-pay | Admitting: Family Medicine

## 2018-04-02 LAB — PSA: Prostate Specific Ag, Serum: 1.1 ng/mL (ref 0.0–4.0)

## 2018-04-07 LAB — HM DIABETES EYE EXAM

## 2018-04-09 ENCOUNTER — Other Ambulatory Visit: Payer: Self-pay

## 2018-04-09 MED ORDER — METOPROLOL TARTRATE 25 MG PO TABS: ORAL_TABLET | ORAL | 2 refills | Status: DC

## 2018-04-13 ENCOUNTER — Other Ambulatory Visit: Payer: Self-pay | Admitting: Family Medicine

## 2018-04-21 ENCOUNTER — Other Ambulatory Visit: Payer: Self-pay | Admitting: Family Medicine

## 2018-04-23 MED ORDER — TAMSULOSIN HCL 0.4 MG PO CAPS: 0.8000 mg | ORAL_CAPSULE | Freq: Every day | ORAL | 1 refills | Status: DC

## 2018-05-06 ENCOUNTER — Other Ambulatory Visit: Payer: Self-pay | Admitting: Family Medicine

## 2018-05-07 ENCOUNTER — Encounter: Payer: Self-pay | Admitting: Family Medicine

## 2018-05-14 ENCOUNTER — Other Ambulatory Visit: Payer: Self-pay | Admitting: Neurology

## 2018-05-14 ENCOUNTER — Other Ambulatory Visit: Payer: Self-pay | Admitting: Family Medicine

## 2018-07-06 ENCOUNTER — Other Ambulatory Visit: Payer: Self-pay | Admitting: Family Medicine

## 2018-07-09 ENCOUNTER — Other Ambulatory Visit: Payer: Self-pay

## 2018-07-09 ENCOUNTER — Other Ambulatory Visit (INDEPENDENT_AMBULATORY_CARE_PROVIDER_SITE_OTHER): Payer: Medicare Other

## 2018-07-09 DIAGNOSIS — Z794 Long term (current) use of insulin: Secondary | ICD-10-CM

## 2018-07-09 DIAGNOSIS — E0821 Diabetes mellitus due to underlying condition with diabetic nephropathy: Secondary | ICD-10-CM

## 2018-07-09 LAB — POCT GLYCOSYLATED HEMOGLOBIN (HGB A1C): HbA1c, POC (controlled diabetic range): 7 % (ref 0.0–7.0)

## 2018-07-14 ENCOUNTER — Encounter: Payer: Self-pay | Admitting: Family Medicine

## 2018-07-22 ENCOUNTER — Encounter: Payer: Self-pay | Admitting: Family Medicine

## 2018-09-08 ENCOUNTER — Other Ambulatory Visit: Payer: Self-pay | Admitting: Family Medicine

## 2018-09-23 ENCOUNTER — Other Ambulatory Visit: Payer: Self-pay

## 2018-09-23 ENCOUNTER — Ambulatory Visit (INDEPENDENT_AMBULATORY_CARE_PROVIDER_SITE_OTHER): Payer: Medicare Other

## 2018-09-23 ENCOUNTER — Ambulatory Visit: Payer: Medicare Other | Admitting: Family Medicine

## 2018-09-23 VITALS — Ht 67.0 in | Wt 160.0 lb

## 2018-09-23 DIAGNOSIS — Z0000 Encounter for general adult medical examination without abnormal findings: Secondary | ICD-10-CM

## 2018-09-23 NOTE — Progress Notes (Addendum)
Subjective:   Tanner Mason is a 78 y.o. male who presents for Medicare Annual/Subsequent preventive examination.  The patient consented to a virtual visit.   Review of Systems: Defer to PCP Cardiac Risk Factors include: advanced age (>20men, >46 women);diabetes mellitus;male gender     Objective:    Vitals: Ht  (1.702 m)   Wt 160 lb (72.6 kg)   BMI 25.06 kg/m   Body mass index is 25.06 kg/m.  Advanced Directives 09/23/2018 07/01/2017 01/23/2017 01/20/2017 01/01/2017 09/26/2016 07/05/2016  Does Patient Have a Medical Advance Directive? No No No No No No No  Type of Advance Directive - - - - - - -  Does patient want to make changes to medical advance directive? - - - - - - -  Copy of Healthcare Power of Attorney in Chart? - - - - - - -  Would patient like information on creating a medical advance directive? Yes (MAU/Ambulatory/Procedural Areas - Information given) No - Patient declined No - Patient declined No - Patient declined No - Patient declined No - Patient declined No - Patient declined    Tobacco Social History   Tobacco Use  Smoking Status Former Smoker  . Packs/day: 1.00  . Years: 10.00  . Pack years: 10.00  . Types: Cigarettes  . Start date: 05-02-42  . Last attempt to quit: 06/10/1967  . Years since quitting: 51.3  Smokeless Tobacco Never Used     Clinical Intake:  Pre-visit preparation completed: Yes  Pain Score: 0-No pain  How often do you need to have someone help you when you read instructions, pamphlets, or other written materials from your doctor or pharmacy?: 1 - Never What is the last grade level you completed in school?: Bachelors Degree in Science- Masters in [employer]   Interpreter Needed?: No   Past Medical History:  Diagnosis Date  . Abdominal ultrasound, abnormal 02/2004   fatty liver  . Anemia   . Angioedema 07/13/2014  . Arthritis    bilateral thumbs   . Atypical chest pain 12/14/2013  . Cataract    removed on both  eyes  . Cellulitis and abscess 07/14/2015  . Diabetes mellitus without complication (HCC)   . DIASTASIS RECTI 12/08/2007   Qualifier: Diagnosis of  By: Sheffield Slider MD, [facility]  Present, but causing no problems   . Elbow dislocation [id number]   Right  . Encounter for diagnostic endoscopy 10/22/04   negative  . History of esophagogastroduodenoscopy 08/13/2007   normal  . Hypertension    controlled with medication   . Treadmill stress test negative for angina pectoris 06/2002   poor HR and BP recovery  . ULNAR NERVE ENTRAPMENT, RIGHT 06/07/2008   Qualifier: Diagnosis of  By: Sheffield Slider MD, [facility] Portela     Past Surgical History:  Procedure Laterality Date  . ANTERIOR CRUCIATE LIGAMENT REPAIR  5/98   Left, staph infection complicated  . BASAL CELL CARCINOMA EXCISION  02/2005   R ear  . BLEPHAROPLASTY  03/2005   with ptosis repair  . cataract surgery Left 2018  . INGUINAL HERNIA REPAIR Right 1947  . INGUINAL HERNIA REPAIR Left 1970   Family History  Problem Relation Age of Onset  . Alzheimer's disease Mother        died age 52  . Hypertension Mother   . Aortic aneurysm Father        died age 64  . Anuerysm Father   . Hypertension Father   . Aortic aneurysm  Brother        repaired plus AI graft  . Diabetes Brother   . Heart disease Brother    Social History   Socioeconomic History  . Marital status: Married    Spouse name: [name]  . Number of children: 2  . Years of education: 16  . Highest education level: Master's degree (e.g., MA, MS, MEng, MEd, MSW, MBA)  Occupational History  . Occupation: MEDIA SPECIALIST    Employer: RETIRED  Social Needs  . Financial resource strain: Not hard at all  . Food insecurity:    Worry: Never true    Inability: Never true  . Transportation needs:    Medical: No    Non-medical: No  Tobacco Use  . Smoking status: Former Smoker    Packs/day: 1.00    Years: 10.00    Pack years: 10.00    Types: Cigarettes    Start date: [birth year]-1943    Last attempt to  quit: 06/10/1967    Years since quitting: 51.3  . Smokeless tobacco: Never Used  Substance and Sexual Activity  . Alcohol use: Yes    Alcohol/week: 2.0 standard drinks    Types: 2 Standard drinks or equivalent per week    Comment: occasional  . Drug use: No  . Sexual activity: Yes    Partners: Female  Lifestyle  . Physical activity:    Days per week: 4 days    Minutes per session: 40 min  . Stress: Not at all  Relationships  . Social connections:    Talks on phone: More than three times a week    Gets together: More than three times a week    Attends religious service: Never    Active member of club or organization: No    Attends meetings of clubs or organizations: Never    Relationship status: Married  Other Topics Concern  . Not on file  Social History Narrative   Daughter, Nehemiah Settle in [facility], has 2 children   Daugher, Marcelino Duster, in [place], 2 children      He retired from teaching in 2010       Emergency Contact: wife [name]   Who lives with you: wife   Any pets: none   Diet: Patient has a varied diet of vegetables, protein, and starch.  Pt reports enjoying his sweets and carbohydrates. Pt reports eating out less due to current pandemic, COVID.   Exercise: Patientt exercises 4x or more a week. [place] Pt and his wife enjoy their "communites" yoga class they attend 2x weekly. Patient was very involved with the [employer], however due to COVID is not open at this time.   Seatbelts: Pt reports wearing seatbelt when in vehicle. Pt wears a helmet while riding his bike.   Sun Exposure/Protection: Pt reports wearing sun screen/hat and is under care of dermatologist.   Hobbies: boating, vacationing, riding his bike, visiting his grandchildren    One story home.           Outpatient Encounter Medications as of 09/23/2018  Medication Sig  . amLODipine (NORVASC) 10 MG tablet Take 10 mg by mouth daily.  Marland Kitchen aspirin (BAYER  CHILDRENS ASPIRIN) 81 MG chewable tablet Chew 81 mg by mouth daily.    Marland Kitchen atorvastatin (LIPITOR) 40 MG tablet TAKE 1 TABLET BY MOUTH EVERY DAY  . BD PEN NEEDLE NANO U/F 32G X 4 MM MISC USE AS INSTRUCTED BY YOUR PHYSICIAN TO  CHECK BLOOD SUGAR  . cetirizine (ZYRTEC) 10 MG tablet Take 10 mg by mouth every evening.  Marland Kitchen EPIPEN 2-PAK 0.3 MG/0.3ML SOAJ injection INJECT 0.3MLS INTO THE MUSLE ONCE  . finasteride (PROSCAR) 5 MG tablet TAKE 1 TABLET BY MOUTH EVERY DAY  . gabapentin (NEURONTIN) 300 MG capsule TAKE 3 CAPSULES (900 MG TOTAL) BY MOUTH 3 (THREE) TIMES DAILY.  Marland Kitchen HUMALOG KWIKPEN 100 UNIT/ML KiwkPen INJECT 0.08 ML (8 UNITS) INTO THE SKIN THREE TIMES DAILY, TAKE 10 UNITS WITH LARGEST MEAL.  Marland Kitchen KLOR-CON M10 10 MEQ tablet TAKE 2 TABLETS BY MOUTH EVERY DAY  . LANTUS SOLOSTAR 100 UNIT/ML Solostar Pen INJECT 45 UNITS INTO THE SKIN DAILY AT 10 PM.  . liraglutide (VICTOZA) 18 MG/3ML SOPN INJECT 1.8MG  INTO THE SKIN DAILY  . metoprolol tartrate (LOPRESSOR) 25 MG tablet TAKE 3 TABS BY MOUTH 2 (TWO) TIMES DAILY AT 10 AM AND 5 PM.  . omeprazole (PRILOSEC) 20 MG capsule daily as needed.  . ONE TOUCH ULTRA TEST test strip USE TO TEST BLOOD SUGAR THREE TIMES DAILY. ICD-10 CODE: E11.9.  Marland Kitchen tamsulosin (FLOMAX) 0.4 MG CAPS capsule Take 2 capsules (0.8 mg total) by mouth daily.  . predniSONE (DELTASONE) 10 MG tablet Take one tablet as needed for tongue swelling. (Patient not taking: Reported on 04/01/2018)  . traMADol (ULTRAM) 50 MG tablet TAKE 1 TABLET BY MOUTH EVERY 6 HOURS AS NEEDED (Patient not taking: Reported on 04/01/2018)   No facility-administered encounter medications on file as of 09/23/2018.       Activities of Daily Living In your present state of health, do you have any difficulty performing the following activities: 09/23/2018  Hearing? N  Vision? Y  Comment patient wears reading glasses   Difficulty concentrating or making decisions? N  Walking or climbing stairs? Y  Comment balance issues    Dressing or bathing? N  Doing errands, shopping? N  Preparing Food and eating ? N  Using the Toilet? N  In the past six months, have you accidently leaked urine? N  Do you have problems with loss of bowel control? N  Managing your Medications? N  Managing your Finances? N  Housekeeping or managing your Housekeeping? N  Some recent data might be hidden    Patient Care Team: Carney Living, MD as PCP - General (Family Medicine) Campbell Stall, MD (Dermatology) Melvenia Needles, MD (Ophthalmology) Ralene Cork, DO (Sports Medicine) Carman Ching, MD as Attending Physician (Gastroenterology) Linna Darner, RD as Dietitian (Family Medicine) Glendale Chard, DO as Consulting Physician (Neurology)   Assessment:   This is a routine wellness examination for [place].  Exercise Activities and Dietary recommendations Current Exercise Habits: Home exercise routine;Structured exercise class(riding his bike [place], yoga class 2x weekly, walking [place] ), Type of exercise: walking;yoga;Other - see comments(biking ), Time (Minutes): 40, Frequency (Times/Week): 4, Weekly Exercise (Minutes/Week): 160, Intensity: Moderate, Exercise limited by: Other - see comments(patient does report balance issues )  Goals    . Weight (lb) < 160 lb (72.6 kg)     The patient has been losing weight since his last office visit in December 2019. The patient weighed 166lbs in December and today "home reported" weighs 160lbs. Patient has been eating out less, partly due to the pandemic. The patient does a generous amount of exercise, consisting of biking, yoga and walking. Patients goal is to have a BMI <25 to be in the "normal" BMI category, anything <25 and to lose  a "few" extra pounds. Patient plans to continue current exercise habits, and hopes to get back to the silver sneakers program at the [facility] once the pandemic over.        Fall Risk Fall Risk  09/23/2018 03/30/2018  07/01/2017 04/11/2017 03/24/2017  Falls in the past year? 0 0 No No No  Number falls in past yr: - 0 - - -  Injury with Fall? - 0 - - -  Risk for fall due to : - - - - -  Follow up - Falls evaluation completed - - -   Is the patient's home free of loose throw rugs in walkways, pet beds, electrical cords, etc?   no      Grab bars in the bathroom? no      Handrails on the stairs?   no      Adequate lighting?   yes  Depression Screen PHQ 2/9 Scores 09/23/2018 07/01/2017 03/05/2017 01/23/2017  PHQ - 2 Score 0 0 0 0  PHQ- 9 Score - - - -    Cognitive Function MMSE - Mini Mental State Exam 06/28/2013 01/21/2012 11/29/2010  Orientation to time Orientation to Place Registration Attention/ Calculation Recall Language- name 2 objects Language- repeat Language- follow 3 step command Language- read & follow direction Write a sentence Copy design Total score 6CIT Screen 09/23/2018  What Year? 0 points  What month? 0 points  What time? 0 points  Count back from 20 0 points  Months in reverse 0 points  Repeat phrase 0 points  Total Score 0    Immunization History  Administered Date(s) Administered  . Influenza Split 01/21/2012  . Influenza Whole 12/28/2005, 01/27/2008, 01/31/2009, 03/06/2010  . Influenza, High Dose Seasonal PF 01/17/2015  . Influenza-Unspecified 12/28/2012, 01/31/2014  . Pneumococcal Conjugate-13 11/08/2014  . Pneumococcal Polysaccharide-23 02/28/1996, 03/23/1996, 04/04/2009  . Td 03/29/2004  . Tdap 11/08/2014  . Zoster 12/06/2009    Screening Tests Health Maintenance  Topic Date Due  . FOOT EXAM  07/02/2018  . LIPID PANEL  07/02/2018  . HEMOGLOBIN A1C  01/09/2019  . OPHTHALMOLOGY EXAM  04/08/2019  . COLONOSCOPY  01/23/2023  . DTaP/Tdap/Td (2 - Td) 11/07/2024  . TETANUS/TDAP  11/07/2024  . PNA vac Low Risk Adult  Completed   Cancer Screenings: Lung: Low Dose CT Chest  recommended if Age 10-80 years, 30 pack-year currently smoking OR have quit w/in 15years. Patient does not qualify. Colorectal: Completed    Plan:  I will mail you your health goals and an advanced directive packet for you and your wife to look over together.   I have personally reviewed and noted the following in the patient's chart:   . Medical and social history . Use of alcohol, tobacco or illicit drugs  . Current medications and supplements . Functional ability and status . Nutritional status . Physical activity . Advanced directives . List of other physicians . Hospitalizations, surgeries, and ER visits in previous 12 months . Vitals . Screenings to include cognitive, depression, and falls . Referrals and appointments  In addition, I have reviewed and discussed with patient certain preventive protocols, quality metrics, and best practice recommendations. A written personalized care plan for preventive services  as well as general preventive health recommendations were provided to patient.    This visit was conducted virtually in the setting of the COVID19 pandemic.    Steva Coldermily P Scott, CMA  09/24/2018  Reviewed and discussed with Ms Cleophas DunkerScott Marshall L Chambliss

## 2018-09-24 NOTE — Patient Instructions (Addendum)
Thank you for talking with me today about your health and wellness goals!   Follow-up with Dr. Deirdre Priest for your diabetes when social distancing is over.  You spoke to Steva Colder, CMA over the phone for your annual wellness visit.  We discussed goals: Goals    . Weight (lb) < 160 lb (72.6 kg)     The patient has been losing weight since his last office visit in December 2019. The patient weighed 166lbs in December and today "home reported" weighs 160lbs. Patient has been eating out less, partly due to the pandemic. The patient does a generous amount of exercise, consisting of biking, yoga and walking. Patients goal is have a BMI <25 to be in the "normal" BMI category, anything <25 and to lose a "few" extra pounds. Patient plans to continue current exercise habits, and hopes to get back to the silver sneakers program at the [facility] once the pandemic over.        We also discussed recommended health maintenance. Please call our office and schedule a visit. As discussed, you are due for your foot exam and lipid panel, these can be completed once social distancing is over! You are up to date with all of your vaccines! Health Maintenance  Topic Date Due  . FOOT EXAM  07/02/2018  . LIPID PANEL  07/02/2018  . HEMOGLOBIN A1C  01/09/2019  . OPHTHALMOLOGY EXAM  04/08/2019  . COLONOSCOPY  01/23/2023  . DTaP/Tdap/Td (2 - Td) 11/07/2024  . TETANUS/TDAP  11/07/2024  . PNA vac Low Risk Adult  Completed    We also discussed completing an advanced directive. Please look this over and once signed can be brought back to our office to be processed.    Our clinic's number is [phone]. Please call with questions or concerns about what we discussed today.   Diet Recommendations for Diabetes   1. Eat at least 3 meals and 1-2 snacks per day. Never go more than 4-5 hours while awake without eating. Eat breakfast within the first hour of getting up.   2. Limit starchy foods to TWO per meal and ONE per  snack. ONE portion of a starchy  food is equal to the following:   - ONE slice of bread (or its equivalent, such as half of a hamburger bun).   - 1/2 cup of a "scoopable" starchy food such as potatoes or rice.   - 15 grams of Total Carbohydrate as shown on food label.  3. Include at every meal: a protein food, a carb food, and vegetables and/or fruit.   - Obtain twice the volume of vegetables as protein or carbohydrate foods for both lunch and dinner.   - Fresh or frozen vegetables are best.   - Keep frozen vegetables on hand for a quick vegetable serving.       Starchy (carb) foods: Bread, rice, pasta, potatoes, corn, cereal, grits, crackers, bagels, muffins, all baked goods.  (Fruits, milk, and yogurt also have carbohydrate, but most of these foods will not spike your blood sugar as most starchy foods will.)  A few fruits do cause high blood sugars; use small portions of bananas (limit to 1/2 at a time), grapes, watermelon, oranges, and most tropical fruits.    Protein foods: Meat, fish, poultry, eggs, dairy foods, and beans such as pinto and kidney beans (beans also provide carbohydrate).       Health Maintenance, Male A healthy lifestyle and preventive care is important for your health and wellness.  Ask your health care provider about what schedule of regular examinations is right for you. What should I know about weight and diet? Eat a Healthy Diet  Eat plenty of vegetables, fruits, whole grains, low-fat dairy products, and lean protein.  Do not eat a lot of foods high in solid fats, added sugars, or salt.  Maintain a Healthy Weight Regular exercise can help you achieve or maintain a healthy weight. You should:  Do at least 150 minutes of exercise each week. The exercise should increase your heart rate and make you sweat (moderate-intensity exercise).  Do strength-training exercises at least twice a week. Watch Your Levels of Cholesterol and Blood Lipids  Have your blood tested  for lipids and cholesterol every 5 years starting at 76 years of age. If you are at high risk for heart disease, you should start having your blood tested when you are 76 years old. You may need to have your cholesterol levels checked more often if: ? Your lipid or cholesterol levels are high. ? You are older than 76 years of age. ? You are at high risk for heart disease. What should I know about cancer screening? Many types of cancers can be detected early and may often be prevented. Lung Cancer  You should be screened every year for lung cancer if: ? You are a current smoker who has smoked for at least 30 years. ? You are a former smoker who has quit within the past 15 years.  Talk to your health care provider about your screening options, when you should start screening, and how often you should be screened. Colorectal Cancer  Routine colorectal cancer screening usually begins at 76 years of age and should be repeated every 5-10 years until you are 76 years old. You may need to be screened more often if early forms of precancerous polyps or small growths are found. Your health care provider may recommend screening at an earlier age if you have risk factors for colon cancer.  Your health care provider may recommend using home test kits to check for hidden blood in the stool.  A small camera at the end of a tube can be used to examine your colon (sigmoidoscopy or colonoscopy). This checks for the earliest forms of colorectal cancer. Prostate and Testicular Cancer  Depending on your age and overall health, your health care provider may do certain tests to screen for prostate and testicular cancer.  Talk to your health care provider about any symptoms or concerns you have about testicular or prostate cancer. Skin Cancer  Check your skin from head to toe regularly.  Tell your health care provider about any new moles or changes in moles, especially if: ? There is a change in a mole's size,  shape, or color. ? You have a mole that is larger than a pencil eraser.  Always use sunscreen. Apply sunscreen liberally and repeat throughout the day.  Protect yourself by wearing long sleeves, pants, a wide-brimmed hat, and sunglasses when outside. What should I know about heart disease, diabetes, and high blood pressure?  If you are 27-32 years of age, have your blood pressure checked every 3-5 years. If you are 6 years of age or older, have your blood pressure checked every year. You should have your blood pressure measured twice-once when you are at a hospital or clinic, and once when you are not at a hospital or clinic. Record the average of the two measurements. To check your blood pressure when  you are not at a hospital or clinic, you can use: ? An automated blood pressure machine at a pharmacy. ? A home blood pressure monitor.  Talk to your health care provider about your target blood pressure.  If you are between 4245-76 years old, ask your health care provider if you should take aspirin to prevent heart disease.  Have regular diabetes screenings by checking your fasting blood sugar level. ? If you are at a normal weight and have a low risk for diabetes, have this test once every three years after the age of 76. ? If you are overweight and have a high risk for diabetes, consider being tested at a younger age or more often.  A one-time screening for abdominal aortic aneurysm (AAA) by ultrasound is recommended for men aged 65-75 years who are current or former smokers. What should I know about preventing infection? Hepatitis B If you have a higher risk for hepatitis B, you should be screened for this virus. Talk with your health care provider to find out if you are at risk for hepatitis B infection. Hepatitis C Blood testing is recommended for:  Everyone born from [id number] through 1963.  Anyone with known risk factors for hepatitis C. Sexually Transmitted Diseases (STDs)  You  should be screened each year for STDs including gonorrhea and chlamydia if: ? You are sexually active and are younger than 76 years of age. ? You are older than 76 years of age and your health care provider tells you that you are at risk for this type of infection. ? Your sexual activity has changed since you were last screened and you are at an increased risk for chlamydia or gonorrhea. Ask your health care provider if you are at risk.  Talk with your health care provider about whether you are at high risk of being infected with HIV. Your health care provider may recommend a prescription medicine to help prevent HIV infection. What else can I do?  Schedule regular health, dental, and eye exams.  Stay current with your vaccines (immunizations).  Do not use any tobacco products, such as cigarettes, chewing tobacco, and e-cigarettes. If you need help quitting, ask your health care provider.  Limit alcohol intake to no more than 2 drinks per day. One drink equals 12 ounces of beer, 5 ounces of wine, or 1 ounces of hard liquor.  Do not use street drugs.  Do not share needles.  Ask your health care provider for help if you need support or information about quitting drugs.  Tell your health care provider if you often feel depressed.  Tell your health care provider if you have ever been abused or do not feel safe at home. This information is not intended to replace advice given to you by your health care provider. Make sure you discuss any questions you have with your health care provider. Document Released: 10/12/2007 Document Revised: 12/13/2015 Document Reviewed: 01/17/2015 Elsevier Interactive Patient Education  2019 [employer]

## 2018-09-25 NOTE — Addendum Note (Signed)
Addended by: Steva Colder on: 09/25/2018 04:56 PM   Modules accepted: Level of Service

## 2018-10-13 ENCOUNTER — Other Ambulatory Visit: Payer: Self-pay | Admitting: Family Medicine

## 2018-10-26 ENCOUNTER — Other Ambulatory Visit: Payer: Self-pay | Admitting: Family Medicine

## 2018-10-28 ENCOUNTER — Other Ambulatory Visit: Payer: Self-pay | Admitting: Family Medicine

## 2018-11-23 ENCOUNTER — Encounter: Payer: Self-pay | Admitting: Family Medicine

## 2018-12-19 ENCOUNTER — Other Ambulatory Visit: Payer: Self-pay | Admitting: Family Medicine

## 2019-01-02 ENCOUNTER — Other Ambulatory Visit: Payer: Self-pay | Admitting: Family Medicine

## 2019-01-14 ENCOUNTER — Other Ambulatory Visit: Payer: Self-pay | Admitting: Family Medicine

## 2019-01-25 ENCOUNTER — Other Ambulatory Visit: Payer: Self-pay | Admitting: Family Medicine

## 2019-03-05 ENCOUNTER — Other Ambulatory Visit: Payer: Self-pay | Admitting: Family Medicine

## 2019-03-30 ENCOUNTER — Other Ambulatory Visit: Payer: Self-pay | Admitting: Family Medicine

## 2019-04-01 ENCOUNTER — Encounter: Payer: Self-pay | Admitting: Neurology

## 2019-04-02 ENCOUNTER — Other Ambulatory Visit: Payer: Self-pay

## 2019-04-02 ENCOUNTER — Telehealth (INDEPENDENT_AMBULATORY_CARE_PROVIDER_SITE_OTHER): Payer: Medicare Other | Admitting: Neurology

## 2019-04-02 VITALS — Ht 67.0 in | Wt 158.0 lb

## 2019-04-02 DIAGNOSIS — G250 Essential tremor: Secondary | ICD-10-CM | POA: Diagnosis not present

## 2019-04-02 DIAGNOSIS — E0842 Diabetes mellitus due to underlying condition with diabetic polyneuropathy: Secondary | ICD-10-CM | POA: Diagnosis not present

## 2019-04-02 MED ORDER — GABAPENTIN 300 MG PO CAPS: ORAL_CAPSULE | ORAL | 3 refills | Status: DC

## 2019-04-02 NOTE — Progress Notes (Signed)
   Virtual Visit via Video Note The purpose of this virtual visit is to provide medical care while limiting exposure to the novel coronavirus.    Consent was obtained for video visit:  Yes.   Answered questions that patient had about telehealth interaction:  Yes.   I discussed the limitations, risks, security and privacy concerns of performing an evaluation and management service by telemedicine. I also discussed with the patient that there may be a patient responsible charge related to this service. The patient expressed understanding and agreed to proceed.  Pt location: Home Physician Location: office Name of referring provider:  Lind Covert, * I connected with Tanner Mason at patients initiation/request on 04/02/2019 at  2:30 PM EST by video enabled telemedicine application and verified that I am speaking with the correct person using two identifiers. Pt MRN:  [id number] Pt DOB:  October 03, 1940 Video Participants:  Tanner Mason;   History of Present Illness: This is a 78 y.o. male with insulin-dependent diabetes mellitus c/b renal insufficiency, hypertension, basal cell carcinoma of the ear s/p resection, hyperlipidemia, and Guillian-Barre syndrome (May 2018) returning for follow-up of neuropathy.  Over the past year, there has been only mild changes in his neuropathy such that he tends to have more stabbing and burning pain in the evening.  There is no new weakness in the feet, imbalance, or falls.  He takes gabapentin 600 mg twice daily and is asking if that can be increased.  His tremors of the hands are slightly worse, especially when he is trying to hold objects.     Observations/Objective:   Vitals:   04/01/19 1541  Weight: 158 lb (71.7 kg)  Height: 5\' 7"  (1.702 m)   Patient is awake, alert, and appears comfortable.  Oriented x 4.   Extraocular muscles are intact. No ptosis.  Face is symmetric.  Speech is not dysarthric.  Antigravity in all extremities.  No  pronator drift.  Mild intention tremor.  Gait appears normal.  He can stand on toes, but unable to stand on heels..   Assessment and Plan:  1.  Diabetic polyneuropathy affecting the feet - slight increase in stabbing pain.    - Increase gabapentin to 600mg  in the morning, add 300mg  at 4pm, and continue 600mg  at bedtime  - Encouraged to take care on uneven ground  - Patient educated on daily foot inspection, fall prevention, and safety precautions around the home.  2.  Essential tremor, mild.  Hopefully titrating his gabapentin will help this.  3.  History of Guillain-Barre Syndrome (May 2017, treated with plasmapheresis).  4. Left foot drop due to L5 radiculopathy, stable.    Follow Up Instructions:   I discussed the assessment and treatment plan with the patient. The patient was provided an opportunity to ask questions and all were answered. The patient agreed with the plan and demonstrated an understanding of the instructions.   The patient was advised to call back or seek an in-person evaluation if the symptoms worsen or if the condition fails to improve as anticipated.  Follow-up in 1 year  Total time spent:  20 minutes     Alda Berthold, DO

## 2019-04-12 LAB — HM DIABETES EYE EXAM

## 2019-04-21 ENCOUNTER — Other Ambulatory Visit: Payer: Self-pay | Admitting: *Deleted

## 2019-04-21 MED ORDER — ONETOUCH ULTRA VI STRP: ORAL_STRIP | 2 refills | Status: DC

## 2019-04-26 ENCOUNTER — Other Ambulatory Visit: Payer: Self-pay | Admitting: *Deleted

## 2019-04-26 ENCOUNTER — Other Ambulatory Visit: Payer: Self-pay | Admitting: Family Medicine

## 2019-04-26 DIAGNOSIS — E0821 Diabetes mellitus due to underlying condition with diabetic nephropathy: Secondary | ICD-10-CM

## 2019-04-26 DIAGNOSIS — Z794 Long term (current) use of insulin: Secondary | ICD-10-CM

## 2019-04-26 MED ORDER — ONETOUCH ULTRA VI STRP: ORAL_STRIP | 2 refills | Status: DC

## 2019-05-04 ENCOUNTER — Ambulatory Visit: Payer: Medicare Other | Admitting: Family Medicine

## 2019-05-05 ENCOUNTER — Ambulatory Visit: Payer: Medicare PPO | Attending: Internal Medicine

## 2019-05-05 DIAGNOSIS — Z20822 Contact with and (suspected) exposure to covid-19: Secondary | ICD-10-CM | POA: Diagnosis not present

## 2019-05-07 LAB — NOVEL CORONAVIRUS, NAA: SARS-CoV-2, NAA: NOT DETECTED

## 2019-05-12 ENCOUNTER — Ambulatory Visit: Payer: Medicare PPO | Admitting: Family Medicine

## 2019-05-12 ENCOUNTER — Encounter: Payer: Self-pay | Admitting: Family Medicine

## 2019-05-12 ENCOUNTER — Other Ambulatory Visit: Payer: Self-pay

## 2019-05-12 VITALS — BP 102/62 | HR 82 | Wt 162.0 lb

## 2019-05-12 DIAGNOSIS — E0821 Diabetes mellitus due to underlying condition with diabetic nephropathy: Secondary | ICD-10-CM

## 2019-05-12 DIAGNOSIS — E7800 Pure hypercholesterolemia, unspecified: Secondary | ICD-10-CM

## 2019-05-12 DIAGNOSIS — I10 Essential (primary) hypertension: Secondary | ICD-10-CM | POA: Diagnosis not present

## 2019-05-12 DIAGNOSIS — D649 Anemia, unspecified: Secondary | ICD-10-CM | POA: Diagnosis not present

## 2019-05-12 DIAGNOSIS — E119 Type 2 diabetes mellitus without complications: Secondary | ICD-10-CM | POA: Diagnosis not present

## 2019-05-12 DIAGNOSIS — Z794 Long term (current) use of insulin: Secondary | ICD-10-CM

## 2019-05-12 DIAGNOSIS — E782 Mixed hyperlipidemia: Secondary | ICD-10-CM | POA: Diagnosis not present

## 2019-05-12 LAB — POCT GLYCOSYLATED HEMOGLOBIN (HGB A1C): HbA1c, POC (controlled diabetic range): 7.5 % — AB (ref 0.0–7.0)

## 2019-05-12 MED ORDER — EPINEPHRINE 0.3 MG/0.3ML IJ SOAJ: INTRAMUSCULAR | 1 refills | Status: DC

## 2019-05-12 NOTE — Assessment & Plan Note (Signed)
At goal  BP Readings from Last 3 Encounters:  05/12/19 102/62  04/01/18 121/80  03/30/18 120/74

## 2019-05-12 NOTE — Patient Instructions (Signed)
Good to see you today!  Thanks for coming in.  Come back in 3 mo for an A1c  Adjust the insulin to Humalog once a day  Get a doughnut pad for the foot callus and insoles for the shoes  I will call you if your tests are not good.  Otherwise I will send you a letter.  If you do not hear from me with in 2 weeks please call our office.     I will check on Covid and best place to get

## 2019-05-12 NOTE — Assessment & Plan Note (Signed)
Will check labs

## 2019-05-12 NOTE — Assessment & Plan Note (Signed)
A1c up but still at goal.  Will recheck in 3 months

## 2019-05-12 NOTE — Progress Notes (Signed)
Subjective  Tanner Mason is a 79 y.o. male is presenting with the following  HYPERTENSION Disease Monitoring: Blood pressure range-not checking Chest pain, palpitations- no       Medications: Compliance- brings in all his meds Lightheadedness,Syncope- no   Edema- only mild and with standing  DIABETES Disease Monitoring: Blood Sugar ranges-usually in the 100s  Medications: Compliance- was taking humalog 10 u only at night without a meal Hypoglycemic symptoms- no  HYPERLIPIDEMIA Medications: Compliance- daily lipitor Right upper quadrant pain- no  Muscle aches- no   Objective Vital Signs reviewed BP 102/62   Pulse 82   Wt 162 lb (73.5 kg)   SpO2 98%   BMI 25.37 kg/m  Heart - Regular rate and rhythm.  No murmurs, gallops or rubs.    Lungs:  Normal respiratory effort, chest expands symmetrically. Lungs are clear to auscultation, no crackles or wheezes. Extremities:  No cyanosis, edema, or deformity noted with good range of motion of all major joints.   Feet - small focal callus on bottom of R foot. No erythema or discharge  Other wise feet in good shape  Assessments/Plans  Diabetes mellitus (HCC) A1c up but still at goal.  Will recheck in 3 months   Essential hypertension, benign At goal  BP Readings from Last 3 Encounters:  05/12/19 102/62  04/01/18 121/80  03/30/18 120/74      Mixed hyperlipidemia Will check labs   ANEMIA, OTHER, UNSPECIFIED Check labs    See after visit summary for details of patient instructions

## 2019-05-12 NOTE — Assessment & Plan Note (Signed)
Check labs 

## 2019-05-13 ENCOUNTER — Encounter: Payer: Self-pay | Admitting: Family Medicine

## 2019-05-13 LAB — CMP14+EGFR
ALT: 29 IU/L (ref 0–44)
AST: 18 IU/L (ref 0–40)
Albumin/Globulin Ratio: 1.9 (ref 1.2–2.2)
Albumin: 4.5 g/dL (ref 3.7–4.7)
Alkaline Phosphatase: 90 IU/L (ref 39–117)
BUN/Creatinine Ratio: 10 (ref 10–24)
BUN: 17 mg/dL (ref 8–27)
Bilirubin Total: 0.7 mg/dL (ref 0.0–1.2)
CO2: 22 mmol/L (ref 20–29)
Calcium: 9.1 mg/dL (ref 8.6–10.2)
Chloride: 103 mmol/L (ref 96–106)
Creatinine, Ser: 1.67 mg/dL — ABNORMAL HIGH (ref 0.76–1.27)
GFR calc Af Amer: 45 mL/min/{1.73_m2} — ABNORMAL LOW (ref >59)
GFR calc non Af Amer: 39 mL/min/{1.73_m2} — ABNORMAL LOW (ref >59)
Globulin, Total: 2.4 g/dL (ref 1.5–4.5)
Glucose: 168 mg/dL — ABNORMAL HIGH (ref 65–99)
Potassium: 4 mmol/L (ref 3.5–5.2)
Sodium: 141 mmol/L (ref 134–144)
Total Protein: 6.9 g/dL (ref 6.0–8.5)

## 2019-05-13 LAB — CBC
Hematocrit: 35.9 % — ABNORMAL LOW (ref 37.5–51.0)
Hemoglobin: 12.4 g/dL — ABNORMAL LOW (ref 13.0–17.7)
MCH: 31.6 pg (ref 26.6–33.0)
MCHC: 34.5 g/dL (ref 31.5–35.7)
MCV: 91 fL (ref 79–97)
Platelets: 135 10*3/uL — ABNORMAL LOW (ref 150–450)
RBC: 3.93 x10E6/uL — ABNORMAL LOW (ref 4.14–5.80)
RDW: 14.1 % (ref 11.6–15.4)
WBC: 4.9 10*3/uL (ref 3.4–10.8)

## 2019-05-13 LAB — LIPID PANEL
Chol/HDL Ratio: 4 ratio (ref 0.0–5.0)
Cholesterol, Total: 112 mg/dL (ref 100–199)
HDL: 28 mg/dL — ABNORMAL LOW (ref >39)
LDL Chol Calc (NIH): 36 mg/dL (ref 0–99)
Triglycerides: 320 mg/dL — ABNORMAL HIGH (ref 0–149)
VLDL Cholesterol Cal: 48 mg/dL — ABNORMAL HIGH (ref 5–40)

## 2019-05-18 ENCOUNTER — Encounter: Payer: Self-pay | Admitting: Family Medicine

## 2019-06-15 ENCOUNTER — Encounter: Payer: Self-pay | Admitting: Family Medicine

## 2019-06-18 ENCOUNTER — Other Ambulatory Visit: Payer: Self-pay | Admitting: Family Medicine

## 2019-06-18 MED ORDER — INSULIN ASPART 100 UNIT/ML ~~LOC~~ SOLN: SUBCUTANEOUS | 6 refills | Status: DC

## 2019-06-20 ENCOUNTER — Encounter: Payer: Self-pay | Admitting: Family Medicine

## 2019-06-21 MED ORDER — NOVOLOG FLEXPEN 100 UNIT/ML ~~LOC~~ SOPN: PEN_INJECTOR | SUBCUTANEOUS | 11 refills | Status: DC

## 2019-06-28 ENCOUNTER — Other Ambulatory Visit: Payer: Self-pay | Admitting: Family Medicine

## 2019-07-02 ENCOUNTER — Other Ambulatory Visit: Payer: Self-pay | Admitting: Family Medicine

## 2019-07-20 ENCOUNTER — Telehealth: Payer: Self-pay | Admitting: *Deleted

## 2019-07-20 NOTE — Telephone Encounter (Signed)
Request rx for accu-chek meter, lancets, fastclix drum and accu-chek guide test strips. New insurance will cover these. Please advise. [place] Bruna Potter, CMA

## 2019-07-21 ENCOUNTER — Encounter: Payer: Self-pay | Admitting: Family Medicine

## 2019-07-21 NOTE — Telephone Encounter (Signed)
Sent my chart message for exact names of supplies

## 2019-07-22 ENCOUNTER — Telehealth: Payer: Self-pay | Admitting: *Deleted

## 2019-07-29 ENCOUNTER — Other Ambulatory Visit: Payer: Self-pay | Admitting: Neurology

## 2019-08-10 ENCOUNTER — Ambulatory Visit: Payer: Medicare PPO | Admitting: Family Medicine

## 2019-08-10 ENCOUNTER — Encounter: Payer: Self-pay | Admitting: Family Medicine

## 2019-08-10 ENCOUNTER — Other Ambulatory Visit: Payer: Self-pay

## 2019-08-10 VITALS — BP 122/80 | HR 85 | Ht 67.0 in | Wt 160.4 lb

## 2019-08-10 DIAGNOSIS — N183 Chronic kidney disease, stage 3 unspecified: Secondary | ICD-10-CM | POA: Diagnosis not present

## 2019-08-10 DIAGNOSIS — E1122 Type 2 diabetes mellitus with diabetic chronic kidney disease: Secondary | ICD-10-CM

## 2019-08-10 DIAGNOSIS — E0821 Diabetes mellitus due to underlying condition with diabetic nephropathy: Secondary | ICD-10-CM

## 2019-08-10 DIAGNOSIS — I10 Essential (primary) hypertension: Secondary | ICD-10-CM | POA: Diagnosis not present

## 2019-08-10 DIAGNOSIS — Z794 Long term (current) use of insulin: Secondary | ICD-10-CM | POA: Diagnosis not present

## 2019-08-10 LAB — POCT GLYCOSYLATED HEMOGLOBIN (HGB A1C): HbA1c, POC (controlled diabetic range): 7.9 % — AB (ref 0.0–7.0)

## 2019-08-10 NOTE — Assessment & Plan Note (Signed)
At goal.  

## 2019-08-10 NOTE — Progress Notes (Signed)
    SUBJECTIVE:   CHIEF COMPLAINT / HPI:   DIABETES Disease Monitoring: Blood Sugar range-checks fasting intermittently Usually in low 100s   Medications Adherence takes Lantus in the PM, sometimes forgets Novolog 8 units with largest meal Hypoglycemic symptoms- no    RENAL FAILURE  Associated Symptoms- Edema: mild       Itching- no Avoiding NSAIDS - yes  Staying hydrated: yes   PERTINENT  PMH / PSH: his Crt has been stable for 4 years  OBJECTIVE:   BP 122/80   Pulse 85   Ht 5\' 7"  (1.702 m)   Wt 160 lb 6.4 oz (72.8 kg)   SpO2 98%   BMI 25.12 kg/m   Psych:  Cognition and judgment appear intact. Alert, communicative  and cooperative with normal attention span and concentration. No apparent delusions, illusions, hallucinations No edema   ASSESSMENT/PLAN:   Diabetes mellitus (HCC) Worsening and not at goal.  He would like to work on diet.  Will change lantus to AM and increase slighlty to 50 u.  Encourage to remember 8 u novolog with largest meal.  May consider jardiance but have to be careful with CRF.  May discuss with Dr   Essential hypertension, benign At goal   Stage 3 chronic renal impairment associated with type 2 diabetes mellitus (HCC) Creatinine seems stable and maintaining his lytes appropriately.  Sees renal once a year      Raymondo Band, MD [facility]

## 2019-08-10 NOTE — Assessment & Plan Note (Signed)
Creatinine seems stable and maintaining his lytes appropriately.  Sees renal once a year

## 2019-08-10 NOTE — Assessment & Plan Note (Signed)
Worsening and not at goal.  He would like to work on diet.  Will change lantus to AM and increase slighlty to 50 u.  Encourage to remember 8 u novolog with largest meal.  May consider jardiance but have to be careful with CRF.  May discuss with Dr Raymondo Band

## 2019-08-10 NOTE — Patient Instructions (Addendum)
Good to see you today!  Thanks for coming in.  For the Diabetes  Lantus 50 u in the AM  Novolog 8 u in afternoon with largest meal  Let me know your wts every month.  If you are not losing weight we may need to add new medications   Come back in 3 months  Let me know of any low blood sugar or any other changes

## 2019-08-24 ENCOUNTER — Encounter: Payer: Self-pay | Admitting: Family Medicine

## 2019-08-24 DIAGNOSIS — I10 Essential (primary) hypertension: Secondary | ICD-10-CM

## 2019-08-24 DIAGNOSIS — Z794 Long term (current) use of insulin: Secondary | ICD-10-CM

## 2019-08-24 DIAGNOSIS — E0821 Diabetes mellitus due to underlying condition with diabetic nephropathy: Secondary | ICD-10-CM

## 2019-08-27 MED ORDER — EMPAGLIFLOZIN 10 MG PO TABS: 10.0000 mg | ORAL_TABLET | Freq: Every day | ORAL | 3 refills | Status: DC

## 2019-08-31 NOTE — Addendum Note (Signed)
Addended by: Pearlean Brownie L on: 08/31/2019 11:16 AM   Modules accepted: Orders

## 2019-09-03 ENCOUNTER — Other Ambulatory Visit: Payer: Medicare PPO

## 2019-09-03 ENCOUNTER — Other Ambulatory Visit: Payer: Self-pay

## 2019-09-03 DIAGNOSIS — I10 Essential (primary) hypertension: Secondary | ICD-10-CM

## 2019-09-04 LAB — BASIC METABOLIC PANEL
BUN/Creatinine Ratio: 12 (ref 10–24)
BUN: 21 mg/dL (ref 8–27)
CO2: 21 mmol/L (ref 20–29)
Calcium: 8.8 mg/dL (ref 8.6–10.2)
Chloride: 104 mmol/L (ref 96–106)
Creatinine, Ser: 1.77 mg/dL — ABNORMAL HIGH (ref 0.76–1.27)
GFR calc Af Amer: 42 mL/min/{1.73_m2} — ABNORMAL LOW (ref >59)
GFR calc non Af Amer: 36 mL/min/{1.73_m2} — ABNORMAL LOW (ref >59)
Glucose: 156 mg/dL — ABNORMAL HIGH (ref 65–99)
Potassium: 3.7 mmol/L (ref 3.5–5.2)
Sodium: 141 mmol/L (ref 134–144)

## 2019-09-06 ENCOUNTER — Encounter: Payer: Self-pay | Admitting: Family Medicine

## 2019-09-10 ENCOUNTER — Other Ambulatory Visit: Payer: Self-pay | Admitting: Family Medicine

## 2019-09-30 ENCOUNTER — Other Ambulatory Visit: Payer: Self-pay | Admitting: Family Medicine

## 2019-10-06 ENCOUNTER — Other Ambulatory Visit: Payer: Self-pay | Admitting: Family Medicine

## 2019-10-12 ENCOUNTER — Other Ambulatory Visit: Payer: Self-pay | Admitting: Family Medicine

## 2019-11-16 ENCOUNTER — Ambulatory Visit (INDEPENDENT_AMBULATORY_CARE_PROVIDER_SITE_OTHER): Payer: Medicare PPO | Admitting: Family Medicine

## 2019-11-16 ENCOUNTER — Other Ambulatory Visit: Payer: Self-pay

## 2019-11-16 ENCOUNTER — Encounter: Payer: Self-pay | Admitting: Family Medicine

## 2019-11-16 VITALS — BP 102/72 | HR 76 | Wt 158.2 lb

## 2019-11-16 DIAGNOSIS — I10 Essential (primary) hypertension: Secondary | ICD-10-CM

## 2019-11-16 DIAGNOSIS — I951 Orthostatic hypotension: Secondary | ICD-10-CM

## 2019-11-16 DIAGNOSIS — R2689 Other abnormalities of gait and mobility: Secondary | ICD-10-CM

## 2019-11-16 DIAGNOSIS — E1140 Type 2 diabetes mellitus with diabetic neuropathy, unspecified: Secondary | ICD-10-CM | POA: Diagnosis not present

## 2019-11-16 DIAGNOSIS — Z1159 Encounter for screening for other viral diseases: Secondary | ICD-10-CM | POA: Diagnosis not present

## 2019-11-16 DIAGNOSIS — I959 Hypotension, unspecified: Secondary | ICD-10-CM | POA: Insufficient documentation

## 2019-11-16 LAB — POCT GLYCOSYLATED HEMOGLOBIN (HGB A1C): HbA1c, POC (controlled diabetic range): 7.5 % — AB (ref 0.0–7.0)

## 2019-11-16 MED ORDER — AMLODIPINE BESYLATE 5 MG PO TABS: 5.0000 mg | ORAL_TABLET | Freq: Every day | ORAL | 1 refills | Status: DC

## 2019-11-16 MED ORDER — EMPAGLIFLOZIN 10 MG PO TABS: 10.0000 mg | ORAL_TABLET | Freq: Every day | ORAL | 3 refills | Status: DC

## 2019-11-16 NOTE — Patient Instructions (Addendum)
Good to see you today - Thanks for coming in  For the Diabetes   Continue medications as you are  Consider taking the Novolog every day with your largest meal  Consider more frequent trips to exercise  Let me know if your fasting blood sugar are regularly > 200  For the Blood pressure and balance  Stop the 10 mg amlodipine  Take amlodipine 5 mg   We will check blood test   Check your blood pressure several times a day and send reading to me on MyChart  If getting worse then let me know  Please always bring your medication bottles Bring your blood pressure cuff   Come back to see me in 3-4 weeks

## 2019-11-16 NOTE — Progress Notes (Addendum)
    SUBJECTIVE:   HPI:   Imbalance Patient reports imbalance when getting out of bed, and with motion. Denies vision disturbances, trouble speaking or swallowing. Additionally, reports increase in extent of neuropathy from ankle to just below the knee.  BP Patient has meter, but has not been using   DM According to patient log, fasting sugars typically in upper 100s range (180s-200s). Sometimes greater than 200. Highest recorded was today at 250. A1C downwardtrending (at 7.5 today).Takes Novolog occasionally (has not taken at all in past week). No dietary changes since the last visit, and reports limited excercise, citing need to get back into routine. Lost 2 lbs since last visit.    PERTINENT  PMH / PSH: Diabetes, hx of anemia  OBJECTIVE:   BP 102/72   Pulse 76   Wt 158 lb 3.2 oz (71.8 kg)   SpO2 96%   BMI 24.78 kg/m    General: Well appearing, no acute distress. Alert and cooperative. Cardiac: RRR, no murmurs, BP 130/72 (pulse 77) supine to 110/62 (pulse 101) standing Neuro: appropriate gait, normal finger to nose, EOM intact Able to stand walk around room on his own but slowly, feels unsteady. Able to get on exam table.      ASSESSMENT/PLAN:  Imbalance Imbalance  Patient with orthostatic hypotension; likely combo of Jardiance reducing fluids, neuropathy, and bp meds. Will maintain Jardiance given extensive benefits. -reduce amlodipine from 10 mg to 5 mg -CBC to check for anemia (previous hx of anemia) -BMP -Seek care if loss of vision, worsening imbalance, or focal deficits Monitor blood pressure and bring in readings   Essential hypertension, benign -Switch from 10mg  to 5mg  amlodipine -check bp regularly, and keep track    Diabetes mellitus (HCC) Reasonable  Take Novolog regularly -continue meds -regular exercise -alert if fasting blood sugar regularly >200.      , MD [facility]

## 2019-11-17 DIAGNOSIS — R2689 Other abnormalities of gait and mobility: Secondary | ICD-10-CM | POA: Insufficient documentation

## 2019-11-17 LAB — CBC
Hematocrit: 38.7 % (ref 37.5–51.0)
Hemoglobin: 12.7 g/dL — ABNORMAL LOW (ref 13.0–17.7)
MCH: 31.2 pg (ref 26.6–33.0)
MCHC: 32.8 g/dL (ref 31.5–35.7)
MCV: 95 fL (ref 79–97)
Platelets: 114 10*3/uL — ABNORMAL LOW (ref 150–450)
RBC: 4.07 x10E6/uL — ABNORMAL LOW (ref 4.14–5.80)
RDW: 14.3 % (ref 11.6–15.4)
WBC: 4.9 10*3/uL (ref 3.4–10.8)

## 2019-11-17 LAB — HEPATITIS C ANTIBODY: Hep C Virus Ab: <0.1 s/co ratio (ref 0.0–0.9)

## 2019-11-17 LAB — BASIC METABOLIC PANEL
BUN/Creatinine Ratio: 14 (ref 10–24)
BUN: 25 mg/dL (ref 8–27)
CO2: 21 mmol/L (ref 20–29)
Calcium: 9.3 mg/dL (ref 8.6–10.2)
Chloride: 105 mmol/L (ref 96–106)
Creatinine, Ser: 1.8 mg/dL — ABNORMAL HIGH (ref 0.76–1.27)
GFR calc Af Amer: 41 mL/min/{1.73_m2} — ABNORMAL LOW (ref >59)
GFR calc non Af Amer: 36 mL/min/{1.73_m2} — ABNORMAL LOW (ref >59)
Glucose: 170 mg/dL — ABNORMAL HIGH (ref 65–99)
Potassium: 3.8 mmol/L (ref 3.5–5.2)
Sodium: 141 mmol/L (ref 134–144)

## 2019-11-17 NOTE — Assessment & Plan Note (Signed)
Imbalance  Patient with orthostatic hypotension; likely combo of Jardiance reducing fluids, neuropathy, and bp meds. Will maintain Jardiance given extensive benefits. -reduce amlodipine from 10 mg to 5 mg -CBC to check for anemia (previous hx of anemia) -BMP -Seek care if loss of vision, worsening imbalance, or focal deficits Monitor blood pressure and bring in readings

## 2019-11-17 NOTE — Assessment & Plan Note (Signed)
Reasonable  Take Novolog regularly -continue meds -regular exercise -alert if fasting blood sugar regularly >200.

## 2019-11-17 NOTE — Assessment & Plan Note (Signed)
-  Switch from 10mg  to 5mg  amlodipine -check bp regularly, and keep track

## 2019-12-08 ENCOUNTER — Ambulatory Visit: Payer: Medicare PPO | Admitting: Family Medicine

## 2019-12-08 ENCOUNTER — Other Ambulatory Visit: Payer: Self-pay | Admitting: Family Medicine

## 2019-12-08 ENCOUNTER — Other Ambulatory Visit: Payer: Self-pay

## 2019-12-08 DIAGNOSIS — N183 Chronic kidney disease, stage 3 unspecified: Secondary | ICD-10-CM | POA: Diagnosis not present

## 2019-12-08 DIAGNOSIS — E1122 Type 2 diabetes mellitus with diabetic chronic kidney disease: Secondary | ICD-10-CM

## 2019-12-08 DIAGNOSIS — R2689 Other abnormalities of gait and mobility: Secondary | ICD-10-CM

## 2019-12-08 DIAGNOSIS — I10 Essential (primary) hypertension: Secondary | ICD-10-CM

## 2019-12-08 NOTE — Assessment & Plan Note (Signed)
His home blood pressure's are in the 130s/70s if correct for his cuff compared to ours here. Continue to monitor and current medications

## 2019-12-08 NOTE — Assessment & Plan Note (Signed)
Stable on jardiance.  Recommend stay away from NSAIDS and follow up with his nephrologist

## 2019-12-08 NOTE — Assessment & Plan Note (Signed)
Improved.  Unsure if definitely related to blood pressure but did get better when lowered his medications.  Urged him to keep hydrated and check blood sugar next time it happens

## 2019-12-08 NOTE — Progress Notes (Signed)
    SUBJECTIVE:   CHIEF COMPLAINT / HPI:   DIZZINESS Improved.  Had brief episode last week when out in sun on his boat.  Did not check his blood sugar.  Did get better when rested and drank water.  No chest pain or shortness of breath.  No falls  HYPERTENSION  Brings in his readings.  His cuff is about 10 patients higher systolic and diastolic than ours.  All his readings are < 146/90s.  He is taking the lower dose of amlodipine  RENAL He is to follow up with his nephrologist later this year.  Is avoiding NSAIDs most of the time   PERTINENT  PMH / PSH: has a boat uses on [employer]  OBJECTIVE:   BP 122/72   Pulse 88   Wt 157 lb 9.6 oz (71.5 kg)   SpO2 98%   BMI 24.68 kg/m   Heart - Regular rate and rhythm.  No murmurs, gallops or rubs.    Lungs:  Normal respiratory effort, chest expands symmetrically. Lungs are clear to auscultation, no crackles or wheezes. Mobility:able to get up and down from exam table without assistance or distress   ASSESSMENT/PLAN:   Imbalance Improved.  Unsure if definitely related to blood pressure but did get better when lowered his medications.  Urged him to keep hydrated and check blood sugar next time it happens   Essential hypertension, benign His home blood pressure's are in the 130s/70s if correct for his cuff compared to ours here. Continue to monitor and current medications   Stage 3 chronic renal impairment associated with type 2 diabetes mellitus (HCC) Stable on jardiance.  Recommend stay away from NSAIDS and follow up with his nephrologist      Carney Living, MD [facility]

## 2019-12-08 NOTE — Patient Instructions (Signed)
Good to see you today - Thanks for coming in  Keep taking all the medications as you are.  Your goal blood pressure is less than 140/90.  Check your blood pressure several times a week.  If regularly higher than this please let me know - either with MyChart or leaving a phone message. Next visit please bring in your blood pressure cuff.    You should be careful with your kidneys.   Avoid getting dehydrated.   Do not take NSAIDS (ibuprofen, Aleve, Goody powders etc).  Take your prescription medications as directed.   Please always bring your medication bottles  Come back to see me in end of October

## 2019-12-14 ENCOUNTER — Other Ambulatory Visit: Payer: Self-pay | Admitting: Family Medicine

## 2020-02-16 ENCOUNTER — Ambulatory Visit: Payer: Medicare PPO | Admitting: Family Medicine

## 2020-02-16 ENCOUNTER — Other Ambulatory Visit: Payer: Self-pay

## 2020-02-16 VITALS — BP 116/72 | HR 82 | Wt 156.8 lb

## 2020-02-16 DIAGNOSIS — R2689 Other abnormalities of gait and mobility: Secondary | ICD-10-CM

## 2020-02-16 DIAGNOSIS — I10 Essential (primary) hypertension: Secondary | ICD-10-CM | POA: Diagnosis not present

## 2020-02-16 DIAGNOSIS — E1140 Type 2 diabetes mellitus with diabetic neuropathy, unspecified: Secondary | ICD-10-CM | POA: Diagnosis not present

## 2020-02-16 LAB — POCT GLYCOSYLATED HEMOGLOBIN (HGB A1C): HbA1c, POC (controlled diabetic range): 7.4 % — AB (ref 0.0–7.0)

## 2020-02-16 NOTE — Assessment & Plan Note (Signed)
Reasonable control.  Asked to try to take insulin 8 u with largest meal more regularly

## 2020-02-16 NOTE — Assessment & Plan Note (Signed)
Well controlled by home readings 130s/70s (even though his cuff seems to read high) Continue current medications

## 2020-02-16 NOTE — Patient Instructions (Addendum)
Good to see you today!  Thanks for coming in.  Let me know what your blood tests from your kidney doctor  Keep taking all your medications as you are  Your diabetes and blood pressure are doing well   Take your 8 u of insulin every day with largest meal  Regular balance and aerobic exercise every day  Consider tapering the gabapentin to see if makes any difference Cut down by one tablet and see if any difference.    Come back in 3 mo for a A1c

## 2020-02-16 NOTE — Progress Notes (Signed)
    SUBJECTIVE:   CHIEF COMPLAINT / HPI:   Feels well Brings all his medications Taking all regularly except misses his insulin 8 units with largest meal about 50% of time No low blood sugar No falls Feel balance is about the same- Has started Yoga   PERTINENT  PMH / PSH: seeing his nephrologist in a month  OBJECTIVE:   BP 116/72   Pulse 82   Wt 156 lb 12.8 oz (71.1 kg)   SpO2 97%   BMI 24.56 kg/m   Well appearing  ASSESSMENT/PLAN:   Essential hypertension, benign Well controlled by home readings 130s/70s (even though his cuff seems to read high) Continue current medications   Diabetes mellitus (HCC) Reasonable control.  Asked to try to take insulin 8 u with largest meal more regularly   Imbalance Stable to improving.  Suggest daily exercise including balance      Carney Living, MD [facility]

## 2020-02-16 NOTE — Assessment & Plan Note (Signed)
Stable to improving.  Suggest daily exercise including balance

## 2020-02-17 NOTE — Telephone Encounter (Signed)
Error. Tanner Mason, CMA  

## 2020-02-24 DIAGNOSIS — N183 Chronic kidney disease, stage 3 unspecified: Secondary | ICD-10-CM | POA: Diagnosis not present

## 2020-02-28 DIAGNOSIS — I129 Hypertensive chronic kidney disease with stage 1 through stage 4 chronic kidney disease, or unspecified chronic kidney disease: Secondary | ICD-10-CM | POA: Diagnosis not present

## 2020-02-28 DIAGNOSIS — N2581 Secondary hyperparathyroidism of renal origin: Secondary | ICD-10-CM | POA: Diagnosis not present

## 2020-02-28 DIAGNOSIS — N1832 Chronic kidney disease, stage 3b: Secondary | ICD-10-CM | POA: Diagnosis not present

## 2020-02-28 DIAGNOSIS — T783XXA Angioneurotic edema, initial encounter: Secondary | ICD-10-CM | POA: Diagnosis not present

## 2020-02-28 DIAGNOSIS — E1122 Type 2 diabetes mellitus with diabetic chronic kidney disease: Secondary | ICD-10-CM | POA: Diagnosis not present

## 2020-03-16 ENCOUNTER — Encounter: Payer: Self-pay | Admitting: Family Medicine

## 2020-03-19 ENCOUNTER — Encounter: Payer: Self-pay | Admitting: Family Medicine

## 2020-03-27 ENCOUNTER — Other Ambulatory Visit: Payer: Self-pay | Admitting: Family Medicine

## 2020-04-10 DIAGNOSIS — D225 Melanocytic nevi of trunk: Secondary | ICD-10-CM | POA: Diagnosis not present

## 2020-04-10 DIAGNOSIS — L918 Other hypertrophic disorders of the skin: Secondary | ICD-10-CM | POA: Diagnosis not present

## 2020-04-10 DIAGNOSIS — Z85828 Personal history of other malignant neoplasm of skin: Secondary | ICD-10-CM | POA: Diagnosis not present

## 2020-04-10 DIAGNOSIS — L570 Actinic keratosis: Secondary | ICD-10-CM | POA: Diagnosis not present

## 2020-04-10 DIAGNOSIS — L578 Other skin changes due to chronic exposure to nonionizing radiation: Secondary | ICD-10-CM | POA: Diagnosis not present

## 2020-04-10 DIAGNOSIS — L814 Other melanin hyperpigmentation: Secondary | ICD-10-CM | POA: Diagnosis not present

## 2020-04-10 DIAGNOSIS — L821 Other seborrheic keratosis: Secondary | ICD-10-CM | POA: Diagnosis not present

## 2020-04-10 DIAGNOSIS — L820 Inflamed seborrheic keratosis: Secondary | ICD-10-CM | POA: Diagnosis not present

## 2020-04-10 DIAGNOSIS — Z86018 Personal history of other benign neoplasm: Secondary | ICD-10-CM | POA: Diagnosis not present

## 2020-04-12 DIAGNOSIS — E119 Type 2 diabetes mellitus without complications: Secondary | ICD-10-CM | POA: Diagnosis not present

## 2020-04-12 DIAGNOSIS — H02402 Unspecified ptosis of left eyelid: Secondary | ICD-10-CM | POA: Diagnosis not present

## 2020-04-12 DIAGNOSIS — H43813 Vitreous degeneration, bilateral: Secondary | ICD-10-CM | POA: Diagnosis not present

## 2020-04-12 DIAGNOSIS — H524 Presbyopia: Secondary | ICD-10-CM | POA: Diagnosis not present

## 2020-04-12 LAB — HM DIABETES EYE EXAM

## 2020-05-25 ENCOUNTER — Encounter: Payer: Self-pay | Admitting: Family Medicine

## 2020-05-26 MED ORDER — BD PEN NEEDLE NANO U/F 32G X 4 MM MISC: 2 refills | Status: DC

## 2020-06-07 ENCOUNTER — Ambulatory Visit (INDEPENDENT_AMBULATORY_CARE_PROVIDER_SITE_OTHER): Payer: Medicare PPO | Admitting: Family Medicine

## 2020-06-07 ENCOUNTER — Other Ambulatory Visit: Payer: Self-pay

## 2020-06-07 VITALS — BP 118/62 | HR 81 | Wt 157.0 lb

## 2020-06-07 DIAGNOSIS — R2689 Other abnormalities of gait and mobility: Secondary | ICD-10-CM

## 2020-06-07 DIAGNOSIS — E1140 Type 2 diabetes mellitus with diabetic neuropathy, unspecified: Secondary | ICD-10-CM

## 2020-06-07 DIAGNOSIS — I10 Essential (primary) hypertension: Secondary | ICD-10-CM | POA: Diagnosis not present

## 2020-06-07 LAB — POCT GLYCOSYLATED HEMOGLOBIN (HGB A1C): Hemoglobin A1C: 6.5 % — AB (ref 4.0–5.6)

## 2020-06-07 NOTE — Assessment & Plan Note (Addendum)
Recommended resuming exercise both balance Yoga and strengthening using wts   Will continue to wean gabapentin since does not seem to change his level of neuropathy - not pain just full feeling in legs

## 2020-06-07 NOTE — Assessment & Plan Note (Signed)
At goal. Continue current medications. 

## 2020-06-07 NOTE — Progress Notes (Signed)
    SUBJECTIVE:   CHIEF COMPLAINT / HPI:   Essential hypertension, benign Home readings with high reading cuff show control.  Continue chronic balance issues but no falls or lightheadness   Diabetes mellitus (HCC) His home readings are very good.  No lows  Imbalance He has stopped exercise due to wifes illness.  Cut back some on gabapentin.  Cant tell a difference.  Has not seen his neurologist for 2 years    PERTINENT  PMH / PSH: wife undergoing work up for melanoma   OBJECTIVE:   BP 118/62   Pulse 81   Wt 157 lb (71.2 kg)   SpO2 97%   BMI 24.59 kg/m   Good spirits Can rise on heels and toes but has to hold on to table for balance Heart - Regular rate and rhythm.  No murmurs, gallops or rubs.    Lungs:  Normal respiratory effort, chest expands symmetrically. Lungs are clear to auscultation, no crackles or wheezes.   ASSESSMENT/PLAN:   Essential hypertension, benign At goal.  Continue current medications   Diabetes mellitus (HCC) Well controlled.  Continue current regimen   Imbalance Recommended resuming exercise both balance Yoga and strengthening using wts   Will continue to wean gabapentin since does not seem to change his level of neuropathy - not pain just full feeling in legs      Carney Living, MD [facility]

## 2020-06-07 NOTE — Assessment & Plan Note (Signed)
Well-controlled.  Continue current regimen. 

## 2020-06-07 NOTE — Patient Instructions (Addendum)
Good to see you today!  Thanks for coming in.  You need an diabetes eye exam every year.  Please see your eye doctor.  Ask them to fax [place] a report of your exam  Gabapentin  Take one in Am and 2 in PM I would follow up with your neurologist  Come back in 6 months   Consider regular weight bearing exercise - every other day

## 2020-06-13 ENCOUNTER — Other Ambulatory Visit: Payer: Self-pay | Admitting: Family Medicine

## 2020-06-16 ENCOUNTER — Other Ambulatory Visit: Payer: Self-pay | Admitting: Family Medicine

## 2020-06-17 ENCOUNTER — Encounter: Payer: Self-pay | Admitting: Family Medicine

## 2020-06-25 ENCOUNTER — Other Ambulatory Visit: Payer: Self-pay | Admitting: Family Medicine

## 2020-07-19 ENCOUNTER — Other Ambulatory Visit: Payer: Self-pay | Admitting: Family Medicine

## 2020-08-31 ENCOUNTER — Ambulatory Visit: Payer: Medicare PPO | Attending: Internal Medicine

## 2020-08-31 ENCOUNTER — Other Ambulatory Visit (HOSPITAL_BASED_OUTPATIENT_CLINIC_OR_DEPARTMENT_OTHER): Payer: Self-pay

## 2020-08-31 ENCOUNTER — Other Ambulatory Visit: Payer: Self-pay

## 2020-08-31 DIAGNOSIS — Z23 Encounter for immunization: Secondary | ICD-10-CM

## 2020-08-31 MED ORDER — PFIZER-BIONT COVID-19 VAC-TRIS 30 MCG/0.3ML IM SUSP
INTRAMUSCULAR | 0 refills | Status: DC
  Filled 2020-08-31: qty 0.3, 1d supply, fill #0

## 2020-08-31 NOTE — Progress Notes (Signed)
   Covid-19 Vaccination Clinic  Name:  Tanner Mason    MRN: [id number] DOB: 18-Oct-1940  08/31/2020  [name] was observed post Covid-19 immunization for 15 minutes without incident. He was provided with Vaccine Information Sheet and instruction to access the V-Safe system.   [name] was instructed to call 911 with any severe reactions post vaccine: Marland Kitchen Difficulty breathing  . Swelling of face and throat  . A fast heartbeat  . A bad rash all over body  . Dizziness and weakness   Immunizations Administered    Name Date Dose VIS Date Route   PFIZER Comrnaty(Gray TOP) Covid-19 Vaccine 08/31/2020 10:13 AM 0.3 mL 04/06/2020 Intramuscular   Manufacturer: [employer], [employer]   Lot: [id number]   NDC: [phone]

## 2020-09-07 ENCOUNTER — Other Ambulatory Visit: Payer: Self-pay | Admitting: Neurology

## 2020-09-07 ENCOUNTER — Encounter: Payer: Self-pay | Admitting: Family Medicine

## 2020-09-07 MED ORDER — GABAPENTIN 300 MG PO CAPS: ORAL_CAPSULE | ORAL | 2 refills | Status: DC

## 2020-09-29 ENCOUNTER — Other Ambulatory Visit: Payer: Self-pay | Admitting: Family Medicine

## 2020-10-11 ENCOUNTER — Other Ambulatory Visit: Payer: Self-pay | Admitting: Family Medicine

## 2020-10-29 ENCOUNTER — Other Ambulatory Visit: Payer: Self-pay | Admitting: Family Medicine

## 2020-11-01 DIAGNOSIS — L237 Allergic contact dermatitis due to plants, except food: Secondary | ICD-10-CM | POA: Diagnosis not present

## 2020-11-26 ENCOUNTER — Other Ambulatory Visit: Payer: Self-pay | Admitting: Family Medicine

## 2020-12-05 ENCOUNTER — Other Ambulatory Visit: Payer: Self-pay | Admitting: Family Medicine

## 2020-12-17 ENCOUNTER — Other Ambulatory Visit: Payer: Self-pay | Admitting: Family Medicine

## 2020-12-28 DIAGNOSIS — N529 Male erectile dysfunction, unspecified: Secondary | ICD-10-CM | POA: Diagnosis not present

## 2020-12-28 DIAGNOSIS — E785 Hyperlipidemia, unspecified: Secondary | ICD-10-CM | POA: Diagnosis not present

## 2020-12-28 DIAGNOSIS — Z7984 Long term (current) use of oral hypoglycemic drugs: Secondary | ICD-10-CM | POA: Diagnosis not present

## 2020-12-28 DIAGNOSIS — N400 Enlarged prostate without lower urinary tract symptoms: Secondary | ICD-10-CM | POA: Diagnosis not present

## 2020-12-28 DIAGNOSIS — R32 Unspecified urinary incontinence: Secondary | ICD-10-CM | POA: Diagnosis not present

## 2020-12-28 DIAGNOSIS — Z794 Long term (current) use of insulin: Secondary | ICD-10-CM | POA: Diagnosis not present

## 2020-12-28 DIAGNOSIS — K219 Gastro-esophageal reflux disease without esophagitis: Secondary | ICD-10-CM | POA: Diagnosis not present

## 2020-12-28 DIAGNOSIS — E1142 Type 2 diabetes mellitus with diabetic polyneuropathy: Secondary | ICD-10-CM | POA: Diagnosis not present

## 2020-12-28 DIAGNOSIS — I10 Essential (primary) hypertension: Secondary | ICD-10-CM | POA: Diagnosis not present

## 2021-01-09 ENCOUNTER — Ambulatory Visit: Payer: Medicare PPO | Admitting: Family Medicine

## 2021-01-09 ENCOUNTER — Encounter: Payer: Self-pay | Admitting: Family Medicine

## 2021-01-09 ENCOUNTER — Other Ambulatory Visit: Payer: Self-pay

## 2021-01-09 DIAGNOSIS — E1140 Type 2 diabetes mellitus with diabetic neuropathy, unspecified: Secondary | ICD-10-CM

## 2021-01-09 DIAGNOSIS — E0821 Diabetes mellitus due to underlying condition with diabetic nephropathy: Secondary | ICD-10-CM | POA: Diagnosis not present

## 2021-01-09 DIAGNOSIS — N183 Chronic kidney disease, stage 3 unspecified: Secondary | ICD-10-CM | POA: Diagnosis not present

## 2021-01-09 DIAGNOSIS — N401 Enlarged prostate with lower urinary tract symptoms: Secondary | ICD-10-CM | POA: Diagnosis not present

## 2021-01-09 DIAGNOSIS — E1143 Type 2 diabetes mellitus with diabetic autonomic (poly)neuropathy: Secondary | ICD-10-CM | POA: Diagnosis not present

## 2021-01-09 DIAGNOSIS — E1122 Type 2 diabetes mellitus with diabetic chronic kidney disease: Secondary | ICD-10-CM

## 2021-01-09 DIAGNOSIS — I10 Essential (primary) hypertension: Secondary | ICD-10-CM

## 2021-01-09 DIAGNOSIS — D649 Anemia, unspecified: Secondary | ICD-10-CM

## 2021-01-09 DIAGNOSIS — E7800 Pure hypercholesterolemia, unspecified: Secondary | ICD-10-CM | POA: Diagnosis not present

## 2021-01-09 DIAGNOSIS — Z794 Long term (current) use of insulin: Secondary | ICD-10-CM

## 2021-01-09 LAB — POCT GLYCOSYLATED HEMOGLOBIN (HGB A1C): HbA1c, POC (controlled diabetic range): 6.4 % (ref 0.0–7.0)

## 2021-01-09 MED ORDER — ZOSTER VAC RECOMB ADJUVANTED 50 MCG/0.5ML IM SUSR: INTRAMUSCULAR | 1 refills | Status: DC

## 2021-01-09 MED ORDER — LANTUS SOLOSTAR 100 UNIT/ML ~~LOC~~ SOPN: 46.0000 [IU] | PEN_INJECTOR | Freq: Every day | SUBCUTANEOUS | 2 refills | Status: DC

## 2021-01-09 NOTE — Assessment & Plan Note (Signed)
Probably over controlled.  Will stop any novolog and decrease lantus and continue other medications

## 2021-01-09 NOTE — Assessment & Plan Note (Signed)
Does not have pain and has been weaning his gabapentin without any change in symptoms.  Continue to wean hopefully to stop

## 2021-01-09 NOTE — Progress Notes (Signed)
    SUBJECTIVE:   CHIEF COMPLAINT / HPI:   Diabetes His blood sugar are all below 200.  Had one episode of low blood sugar with mild confusion.  Knows his medications and bring in bottles  Cholesterol Taking statin regularly  Hypertension Has neuropathy but no lightheadness or falls.  Takes medications regularly   Balance  No falls is working on exercise  Renal Scheduled to see his nephrologist.  Gets up to urinate 3 x a night.  Taking finasteride and tamsulosin regularly    OBJECTIVE:   BP 118/75   Pulse 75   Wt 154 lb 6.4 oz (70 kg)   SpO2 99%   BMI 24.18 kg/m   Psych:  Cognition and judgment appear intact. Alert, communicative  and cooperative with normal attention span and concentration. No apparent delusions, illusions, hallucinations   ASSESSMENT/PLAN:   Essential hypertension, benign BP Readings from Last 3 Encounters:  01/09/21 118/75  06/07/20 118/62  02/16/20 116/72   At goal continue current medications,    Stage 3 chronic renal impairment associated with type 2 diabetes mellitus (HCC) Will check labs and renal US given his bph.  Has follow up with nephrology.  Continue SGLT2 and GLP1.  Need to watch K closely since is on replacement   Peripheral autonomic neuropathy due to diabetes mellitus (HCC) Does not have pain and has been weaning his gabapentin without any change in symptoms.  Continue to wean hopefully to stop   Diabetes mellitus (HCC) Probably over controlled.  Will stop any novolog and decrease lantus and continue other medications      Carney Living, MD [facility]

## 2021-01-09 NOTE — Patient Instructions (Signed)
Good to see you today - Thank you for coming in  Things we discussed today:  Diabetes - Stop the novolog completely - Decrease the lantus to 46 units daily  Neuropathy Take only one gabapentin at night for 2 weeks.  If no change in symptoms then stop completely   Kidney  Follow up with your nephrologist We will check blood tests Will check an US of the kidney   I will call you if your tests are not good.  Otherwise, I will send you a message on MyChart (if it is active) or a letter in the mail..  If you do not hear from me with in 2 weeks please call our office.      I sent a prescription to your pharmacy for your Zoster vaccines to help prevent Shingles.  The shot may cause a sore arm and mild flu like symptoms for a few days.  You will need a second shot 2 months after your first.    Please always bring your medication bottles  Come back to see me in 3 months

## 2021-01-09 NOTE — Assessment & Plan Note (Signed)
BP Readings from Last 3 Encounters:  01/09/21 118/75  06/07/20 118/62  02/16/20 116/72   At goal continue current medications,

## 2021-01-09 NOTE — Assessment & Plan Note (Signed)
Will check labs and renal US given his bph.  Has follow up with nephrology.  Continue SGLT2 and GLP1.  Need to watch K closely since is on replacement

## 2021-01-10 LAB — CBC
Hematocrit: 36.9 % — ABNORMAL LOW (ref 37.5–51.0)
Hemoglobin: 13.2 g/dL (ref 13.0–17.7)
MCH: 33 pg (ref 26.6–33.0)
MCHC: 35.8 g/dL — ABNORMAL HIGH (ref 31.5–35.7)
MCV: 92 fL (ref 79–97)
Platelets: 119 10*3/uL — ABNORMAL LOW (ref 150–450)
RBC: 4 x10E6/uL — ABNORMAL LOW (ref 4.14–5.80)
RDW: 14.3 % (ref 11.6–15.4)
WBC: 4.5 10*3/uL (ref 3.4–10.8)

## 2021-01-10 LAB — CMP14+EGFR
ALT: 24 IU/L (ref 0–44)
AST: 15 IU/L (ref 0–40)
Albumin/Globulin Ratio: 1.7 (ref 1.2–2.2)
Albumin: 4.4 g/dL (ref 3.7–4.7)
Alkaline Phosphatase: 70 IU/L (ref 44–121)
BUN/Creatinine Ratio: 12 (ref 10–24)
BUN: 20 mg/dL (ref 8–27)
Bilirubin Total: 0.8 mg/dL (ref 0.0–1.2)
CO2: 25 mmol/L (ref 20–29)
Calcium: 9.3 mg/dL (ref 8.6–10.2)
Chloride: 107 mmol/L — ABNORMAL HIGH (ref 96–106)
Creatinine, Ser: 1.68 mg/dL — ABNORMAL HIGH (ref 0.76–1.27)
Globulin, Total: 2.6 g/dL (ref 1.5–4.5)
Glucose: 95 mg/dL (ref 65–99)
Potassium: 4 mmol/L (ref 3.5–5.2)
Sodium: 145 mmol/L — ABNORMAL HIGH (ref 134–144)
Total Protein: 7 g/dL (ref 6.0–8.5)
eGFR: 41 mL/min/{1.73_m2} — ABNORMAL LOW (ref >59)

## 2021-01-10 LAB — LIPID PANEL
Chol/HDL Ratio: 2.6 ratio (ref 0.0–5.0)
Cholesterol, Total: 79 mg/dL — ABNORMAL LOW (ref 100–199)
HDL: 30 mg/dL — ABNORMAL LOW (ref >39)
LDL Chol Calc (NIH): 27 mg/dL (ref 0–99)
Triglycerides: 119 mg/dL (ref 0–149)
VLDL Cholesterol Cal: 22 mg/dL (ref 5–40)

## 2021-01-11 ENCOUNTER — Encounter: Payer: Self-pay | Admitting: Family Medicine

## 2021-01-12 ENCOUNTER — Encounter: Payer: Self-pay | Admitting: Family Medicine

## 2021-01-12 ENCOUNTER — Other Ambulatory Visit: Payer: Self-pay | Admitting: Family Medicine

## 2021-01-12 DIAGNOSIS — I714 Abdominal aortic aneurysm, without rupture, unspecified: Secondary | ICD-10-CM

## 2021-01-13 ENCOUNTER — Encounter: Payer: Self-pay | Admitting: Family Medicine

## 2021-01-16 ENCOUNTER — Ambulatory Visit (HOSPITAL_COMMUNITY)
Admission: RE | Admit: 2021-01-16 | Discharge: 2021-01-16 | Disposition: A | Discharge status: Home / Self Care | Payer: Medicare PPO | Source: Ambulatory Visit | Attending: Family Medicine | Admitting: Family Medicine

## 2021-01-16 ENCOUNTER — Other Ambulatory Visit: Payer: Self-pay

## 2021-01-16 DIAGNOSIS — N281 Cyst of kidney, acquired: Secondary | ICD-10-CM | POA: Insufficient documentation

## 2021-01-16 DIAGNOSIS — N401 Enlarged prostate with lower urinary tract symptoms: Secondary | ICD-10-CM | POA: Diagnosis not present

## 2021-01-16 DIAGNOSIS — N1330 Unspecified hydronephrosis: Secondary | ICD-10-CM | POA: Diagnosis not present

## 2021-01-18 ENCOUNTER — Encounter: Payer: Self-pay | Admitting: Family Medicine

## 2021-01-25 ENCOUNTER — Other Ambulatory Visit: Payer: Self-pay | Admitting: Family Medicine

## 2021-01-25 ENCOUNTER — Telehealth: Payer: Self-pay

## 2021-01-25 NOTE — Telephone Encounter (Signed)
Scheduled and called patient for US Aorta at:  Tanner Mason 02/02/2021 0930 with arrival at 0915 NPO after midnight  Patient verbalizes understanding.  Glennie Hawk, CMA

## 2021-02-02 ENCOUNTER — Ambulatory Visit (HOSPITAL_COMMUNITY)
Admission: RE | Admit: 2021-02-02 | Discharge: 2021-02-02 | Disposition: A | Discharge status: Home / Self Care | Payer: Medicare PPO | Source: Ambulatory Visit | Attending: Family Medicine | Admitting: Family Medicine

## 2021-02-02 ENCOUNTER — Other Ambulatory Visit: Payer: Self-pay

## 2021-02-02 DIAGNOSIS — I714 Abdominal aortic aneurysm, without rupture, unspecified: Secondary | ICD-10-CM | POA: Insufficient documentation

## 2021-02-02 DIAGNOSIS — Z0389 Encounter for observation for other suspected diseases and conditions ruled out: Secondary | ICD-10-CM | POA: Diagnosis not present

## 2021-02-06 ENCOUNTER — Encounter: Payer: Self-pay | Admitting: Family Medicine

## 2021-02-26 ENCOUNTER — Other Ambulatory Visit: Payer: Self-pay | Admitting: *Deleted

## 2021-02-27 MED ORDER — BD PEN NEEDLE NANO U/F 32G X 4 MM MISC: 2 refills | Status: DC

## 2021-03-13 DIAGNOSIS — N1832 Chronic kidney disease, stage 3b: Secondary | ICD-10-CM | POA: Diagnosis not present

## 2021-03-14 ENCOUNTER — Other Ambulatory Visit: Payer: Self-pay | Admitting: Family Medicine

## 2021-03-21 DIAGNOSIS — I129 Hypertensive chronic kidney disease with stage 1 through stage 4 chronic kidney disease, or unspecified chronic kidney disease: Secondary | ICD-10-CM | POA: Diagnosis not present

## 2021-03-21 DIAGNOSIS — N1832 Chronic kidney disease, stage 3b: Secondary | ICD-10-CM | POA: Diagnosis not present

## 2021-03-21 DIAGNOSIS — T783XXA Angioneurotic edema, initial encounter: Secondary | ICD-10-CM | POA: Diagnosis not present

## 2021-03-21 DIAGNOSIS — E1122 Type 2 diabetes mellitus with diabetic chronic kidney disease: Secondary | ICD-10-CM | POA: Diagnosis not present

## 2021-03-21 DIAGNOSIS — N2581 Secondary hyperparathyroidism of renal origin: Secondary | ICD-10-CM | POA: Diagnosis not present

## 2021-04-04 ENCOUNTER — Encounter: Payer: Self-pay | Admitting: Family Medicine

## 2021-04-04 ENCOUNTER — Ambulatory Visit: Payer: Medicare PPO | Admitting: Family Medicine

## 2021-04-04 ENCOUNTER — Other Ambulatory Visit: Payer: Self-pay

## 2021-04-04 VITALS — BP 125/75 | HR 78 | Ht 67.0 in | Wt 153.2 lb

## 2021-04-04 DIAGNOSIS — E1143 Type 2 diabetes mellitus with diabetic autonomic (poly)neuropathy: Secondary | ICD-10-CM

## 2021-04-04 DIAGNOSIS — I10 Essential (primary) hypertension: Secondary | ICD-10-CM

## 2021-04-04 DIAGNOSIS — E663 Overweight: Secondary | ICD-10-CM

## 2021-04-04 DIAGNOSIS — I714 Abdominal aortic aneurysm, without rupture, unspecified: Secondary | ICD-10-CM | POA: Diagnosis not present

## 2021-04-04 DIAGNOSIS — E1140 Type 2 diabetes mellitus with diabetic neuropathy, unspecified: Secondary | ICD-10-CM | POA: Diagnosis not present

## 2021-04-04 DIAGNOSIS — E7800 Pure hypercholesterolemia, unspecified: Secondary | ICD-10-CM | POA: Diagnosis not present

## 2021-04-04 DIAGNOSIS — K219 Gastro-esophageal reflux disease without esophagitis: Secondary | ICD-10-CM | POA: Diagnosis not present

## 2021-04-04 LAB — POCT GLYCOSYLATED HEMOGLOBIN (HGB A1C): HbA1c, POC (controlled diabetic range): 7.2 % — AB (ref 0.0–7.0)

## 2021-04-04 NOTE — Patient Instructions (Signed)
Good to see you today - Thank you for coming in  Things we discussed today:  Make an appointment to see Dr Allena Katz for neuropathy  Try going off gabapentin   Cut down omeprazole as needed   Please always bring your medication bottles  Come back to see me in 3 months to check an a1c

## 2021-04-04 NOTE — Assessment & Plan Note (Signed)
Has family history but never had one on any imaging

## 2021-04-04 NOTE — Assessment & Plan Note (Signed)
Stable a1c.  Will continue to monitor Want to avoid hypoglycemia with his balance issues

## 2021-04-04 NOTE — Progress Notes (Signed)
    SUBJECTIVE:   CHIEF COMPLAINT / HPI:   Diabetes His blood sugar have been good.  Knows his medications and bring in bottles   Cholesterol Taking statin regularly   Hypertension Has neuropathy but no lightheadness or falls.  Takes medications regularly    Neuropathy Is weaning down the gabapentin to one daily without any change in symptoms.  Feels the loss of sensation is progressing.  No sores or foot lesions and practices good foot hygiene   Renal Saw Dr Malen Gauze his nephrologist.  She feels things are stable and will see him back in one year    PERTINENT  PMH / PSH: wife had knee replacement but is having other pain and respiratory issues   OBJECTIVE:   BP 125/75   Pulse 78   Ht 5\' 7"  (1.702 m)   Wt 153 lb 3.2 oz (69.5 kg)   SpO2 97%   BMI 23.99 kg/m   Feet - warm soft no lesions.  Good pedal pulses.  No hair   ASSESSMENT/PLAN:   Essential hypertension, benign At goal continue current medications   Abdominal aneurysm Has family history but never had one on any imaging   Type 2 diabetes mellitus with diabetic neuropathy, unspecified (HCC) Stable a1c.  Will continue to monitor Want to avoid hypoglycemia with his balance issues   Peripheral autonomic neuropathy due to diabetes mellitus (HCC) Worsening loss of sensation.  No change in discomfort with decreasing gabapentin so will stop.  Will refer back to his neurologist Dr to see if any further work up is needed   Pure hypercholesterolemia Stable on lipitor   Overweight (BMI 25.0-29.9) Improving with diet and walking   GASTROESOPHAGEAL REFLUX DISEASE, CHRONIC Controlled Discussed taking PPI as needed instead of regularly      03-01-2001, MD [facility]

## 2021-04-04 NOTE — Assessment & Plan Note (Signed)
Improving with diet and walking

## 2021-04-04 NOTE — Assessment & Plan Note (Signed)
Worsening loss of sensation.  No change in discomfort with decreasing gabapentin so will stop.  Will refer back to his neurologist Dr Allena Katz to see if any further work up is needed

## 2021-04-04 NOTE — Assessment & Plan Note (Signed)
At goal continue current medications  

## 2021-04-04 NOTE — Assessment & Plan Note (Signed)
Controlled Discussed taking PPI as needed instead of regularly

## 2021-04-04 NOTE — Assessment & Plan Note (Signed)
Stable on lipitor

## 2021-04-10 DIAGNOSIS — L570 Actinic keratosis: Secondary | ICD-10-CM | POA: Diagnosis not present

## 2021-04-10 DIAGNOSIS — L814 Other melanin hyperpigmentation: Secondary | ICD-10-CM | POA: Diagnosis not present

## 2021-04-10 DIAGNOSIS — L578 Other skin changes due to chronic exposure to nonionizing radiation: Secondary | ICD-10-CM | POA: Diagnosis not present

## 2021-04-10 DIAGNOSIS — Z23 Encounter for immunization: Secondary | ICD-10-CM | POA: Diagnosis not present

## 2021-04-10 DIAGNOSIS — L821 Other seborrheic keratosis: Secondary | ICD-10-CM | POA: Diagnosis not present

## 2021-04-10 DIAGNOSIS — Z86018 Personal history of other benign neoplasm: Secondary | ICD-10-CM | POA: Diagnosis not present

## 2021-04-10 DIAGNOSIS — Z85828 Personal history of other malignant neoplasm of skin: Secondary | ICD-10-CM | POA: Diagnosis not present

## 2021-04-10 DIAGNOSIS — D225 Melanocytic nevi of trunk: Secondary | ICD-10-CM | POA: Diagnosis not present

## 2021-04-10 NOTE — Progress Notes (Signed)
Follow-up Visit   Date: 04/11/21    Tanner Mason MRN: [id number] DOB: 1941-03-27   Interim History: Tanner Mason is a 80 y.o. right-handed Caucasian male with insulin-dependent diabetes mellitus c/b renal insufficiency, hypertension, basal cell carcinoma of the ear s/p resection, hyperlipidemia, and Guillian-Barre syndrome (May 2018) returning for follow-up of neuropathy.  History of present illness: In May 2017, he was visiting [facility] and suddenly developed tingling of the feet and lower legs. He was evaluated at the ER in [place] where MRI brain and lumbar spine was unremarkable.  The following day he came to [facility] and was unable to walk which prompted him to go to the [facility] ED.  By the time he arrived at the ED, his tingling and numbness involved the level of thigh.  Patient was hospitalized at [facility] from 5/21 - 09/26/2015 and work-up consistent with GBS. He completed 5 sessions of plasmapheresis. After the 3rd session, he began noticing improved paresthesias.  He was discharged to rehab facility and went home on 6/12 with a walker and out-patient PT.  He also has left foot weakness which has been present for at least the past 4 years.  He was told that he may need back surgery for this some time back, but decided to pursue it conservatively. He takes gabapentin 950m TID which controls painful paresthesias.  UPDATE 04/11/2021:  He is here for follow-up visit because of progressive neuropathy.  He has noticed worsening numbness extending up to towards his knees over the past several years.  He has tapered his gabapentin to 300 mg at bedtime and has not noticed any worsening pain.  In fact, he denies any ongoing tingling or pain in the feet.  He does not have any numbness or tingling of the hands.  His balance is fair.  He walks unassisted but is careful on uneven ground.  He has not had any falls.  He is concerned about the progression of his  neuropathy and has many questions which were addressed today.  Medications:  Current Outpatient Medications on File Prior to Visit  Medication Sig Dispense Refill   ACCU-CHEK GUIDE test strip CHECKING FASTING ONCE DAILY 100 strip 3   amLODipine (NORVASC) 5 MG tablet TAKE 1 TABLET BY MOUTH EVERY DAY 90 tablet 3   atorvastatin (LIPITOR) 40 MG tablet TAKE 1 TABLET BY MOUTH EVERY DAY 90 tablet 3   cetirizine (ZYRTEC) 10 MG tablet Take 10 mg by mouth every evening.     COVID-19 mRNA Vac-TriS, Pfizer, (PFIZER-BIONT COVID-19 VAC-TRIS) SUSP injection Inject into the muscle. 0.3 mL 0   EPINEPHrine (EPIPEN 2-PAK) 0.3 mg/0.3 mL IJ SOAJ injection INJECT 0.3MLS INTO THE MUSLE ONCE 1 each 1   finasteride (PROSCAR) 5 MG tablet TAKE 1 TABLET BY MOUTH EVERY DAY 90 tablet 3   gabapentin (NEURONTIN) 300 MG capsule Take 1 tablets in the morning, and 2 tablet at bedtime. (Patient taking differently: Take 1 tablets in the evening before bed) 270 capsule 2   insulin glargine (LANTUS SOLOSTAR) 100 UNIT/ML Solostar Pen Inject 46 Units into the skin daily. INJECT 50 UNITS INTO THE SKIN DAILY. 15 mL 2   Insulin Pen Needle (BD PEN NEEDLE NANO U/F) 32G X 4 MM MISC Use to inject twice insulin twice daily 100 each 2   JARDIANCE 10 MG TABS tablet TAKE 1 TABLET BY MOUTH EVERY DAY 90 tablet 3   KLOR-CON M10 10 MEQ tablet TAKE 2 TABLETS BY MOUTH EVERY  DAY 180 tablet 3   metoprolol tartrate (LOPRESSOR) 25 MG tablet TAKE 3 TABS BY MOUTH 2 (TWO) TIMES DAILY AT 10 AM AND 5 PM. 540 tablet 2   omeprazole (PRILOSEC) 20 MG capsule TAKE 1 CAPSULE BY MOUTH EVERY DAY (Patient taking differently: daily as needed.) 90 capsule 3   tamsulosin (FLOMAX) 0.4 MG CAPS capsule TAKE 2 CAPSULES BY MOUTH EVERY DAY 180 capsule 2   VICTOZA 18 MG/3ML SOPN INJECT 1.8 MG UNDER THE SKIN ONCE DAILY 27 mL 11   VITAMIN D PO Take 1,000 Units by mouth as needed.     Zoster Vaccine Adjuvanted [facility]) injection Inject 0.5 ml IM and Repeat in 2 months 0.5 mL 1    No current facility-administered medications on file prior to visit.    Allergies:  Allergies  Allergen Reactions   Ace Inhibitors Other (See Comments)    REACTION: Angioedema   Calcium Carbonate Antacid Other (See Comments)    REACTION: Angioedema of mouth    Vital Signs:  BP 136/76    Pulse 92    Ht '5\' 7"'  (1.702 m)    Wt 155 lb (70.3 kg)    SpO2 98%    BMI 24.28 kg/m   Neurological Exam: MENTAL STATUS including orientation to time, place, person, recent and remote memory, attention span and concentration, language, and fund of knowledge is normal.  Speech is not dysarthric.  CRANIAL NERVES:  Pupils are round and and reactive. Normal conjugate, extra-ocular eye movements in all directions of gaze.  Left ptosis (old).  Face is symmetric. Palate elevates symmetrically.  Tongue is midline.  MOTOR:  Motor strength is 5/5 in all extremities, including left dorsiflexion, toe extension and eversion (improved).  Bilateral toe flexion is 5-/5, right toe extension is 5-/5.  Tone is normal.   Trace intention tremor of the left > right hand.    MSRs:  Right                                                                 Left brachioradialis 2+  brachioradialis 2+  biceps 2+  biceps 2+  triceps 1+  triceps 1+  patellar 0  Patellar 0  ankle jerk 0  ankle jerk 0   SENSORY: Vibration is intact at the knees and absent below the ankles.  Pinprick is reduced from the mid cough distally into the foot.  Temperature is intact throughout.  Rhomberg is positive.    COORDINATION/GAIT:  Gait appears stable, there is no dragging of the foot today, which was previously seen.  Stressed gait is intact.  He is unable to perform tandem gait.   Data: MRI lumbar spine wo contradst 09/19/2015: 1. Overall stable appearance of the lumbar spine as compared to previous MRI from 04/13/2014. No significant canal stenosis. No evidence for cord compression. 2. Similar effacement of the fat around the descending left  L5 nerve root in the left L4-5 lateral recess, again suspicious for a small sequestered disc fragment. This could potentially affect the transiting left L5 nerve root. This is similar to prior. 3. Mild to moderate bilateral foraminal stenosis at L4-5 and L5-S1, stable.   NCS/EMG of the left side 05/30/2016: Chronic sensorimotor polyneuropathy, predominantly axon loss in type, affecting the lower extremities; severe in  degree electrically.  There is evidence of a superimposed chronic L5 radiculopathy on the left. Left ulnar neuropathy with slowing across the elbow, demyelinating and axon loss in type.    CSF 09/18/2015:  R1  W1  G148*  P135*, IgG index 8.7, CSF cultures negative Las 09/18/2015:  CRP 0.6, vitamin B12 471, ANA neg, HIV neg, RPR, neg, TSH 1.32, HbA1c 7.4*, ESR 25 Labs 06/14/2016:  ESR 3, CRP 0.1, vitamin B12, ACE 44, folate >23.4, MMA 158, ANA neg, copper 106, vitamin B1 22, SPEP with IFE no M protein Lab Results  Component Value Date   HGBA1C 7.2 (A) 04/04/2021    IMPRESSION/PLAN: 1.  Diabetic polyneuropathy affecting the feet and lower legs.  There has been mild progression of neuropathy as expected with the disease course.  I explained that over time neuropathy slowly gets worse, however it is a very slow process.  His exam shows sensory changes from the mid-calf distally as well as sensory ataxia.  Given that symptoms have transitioned away from pain and mostly numbness, he may try to stop gabapentin altogether.  For his imbalance, I suggested physical therapy.  He would like to hold off on this as his wife also has her own medical conditions they are tending to.  Patient educated on daily foot inspection, fall prevention, and safety precautions around the home.  I also encouraged him to start using a cane on any uneven ground.  All questions were answered.  2.  Essential tremor of the hands, trace. Not as apparent today.   3.  History of Guillain-Barre Syndrome (May 2017, treated  with plasmapheresis).  4.  Left foot drop due to L5 radiculopathy, improved.  Return to clinic as needed  Total time spent reviewing records, interview, history/exam, documentation, counseling, and coordination of care on day of encounter:  30 min   Thank you for allowing me to participate in patient's care.  If I can answer any additional questions, I would be pleased to do so.    Sincerely,    Tamberlyn Midgley K. Posey Pronto, DO

## 2021-04-11 ENCOUNTER — Encounter: Payer: Self-pay | Admitting: Neurology

## 2021-04-11 ENCOUNTER — Ambulatory Visit: Payer: Medicare PPO | Admitting: Neurology

## 2021-04-11 ENCOUNTER — Other Ambulatory Visit: Payer: Self-pay

## 2021-04-11 VITALS — BP 136/76 | HR 92 | Ht 67.0 in | Wt 155.0 lb

## 2021-04-11 DIAGNOSIS — E1142 Type 2 diabetes mellitus with diabetic polyneuropathy: Secondary | ICD-10-CM

## 2021-04-11 NOTE — Patient Instructions (Addendum)
Check your feet daily  Start to use a cane on uneven ground  If you would like to do balance therapy, please let me know  Wishing you and your family a very happy holidays!

## 2021-05-01 DIAGNOSIS — H43813 Vitreous degeneration, bilateral: Secondary | ICD-10-CM | POA: Diagnosis not present

## 2021-05-01 DIAGNOSIS — H524 Presbyopia: Secondary | ICD-10-CM | POA: Diagnosis not present

## 2021-05-01 DIAGNOSIS — H02403 Unspecified ptosis of bilateral eyelids: Secondary | ICD-10-CM | POA: Diagnosis not present

## 2021-05-01 DIAGNOSIS — E119 Type 2 diabetes mellitus without complications: Secondary | ICD-10-CM | POA: Diagnosis not present

## 2021-05-01 LAB — HM DIABETES EYE EXAM

## 2021-05-15 ENCOUNTER — Encounter: Payer: Self-pay | Admitting: Family Medicine

## 2021-06-06 ENCOUNTER — Encounter: Payer: Self-pay | Admitting: Family Medicine

## 2021-06-06 DIAGNOSIS — N401 Enlarged prostate with lower urinary tract symptoms: Secondary | ICD-10-CM

## 2021-06-14 ENCOUNTER — Other Ambulatory Visit: Payer: Self-pay | Admitting: Family Medicine

## 2021-07-02 ENCOUNTER — Encounter: Payer: Self-pay | Admitting: Family Medicine

## 2021-07-02 ENCOUNTER — Other Ambulatory Visit: Payer: Self-pay | Admitting: Family Medicine

## 2021-07-02 ENCOUNTER — Other Ambulatory Visit: Payer: Self-pay

## 2021-07-02 ENCOUNTER — Ambulatory Visit: Payer: Medicare PPO | Admitting: Family Medicine

## 2021-07-02 VITALS — BP 114/64 | HR 73 | Wt 150.0 lb

## 2021-07-02 DIAGNOSIS — E1143 Type 2 diabetes mellitus with diabetic autonomic (poly)neuropathy: Secondary | ICD-10-CM

## 2021-07-02 DIAGNOSIS — I10 Essential (primary) hypertension: Secondary | ICD-10-CM

## 2021-07-02 DIAGNOSIS — E1140 Type 2 diabetes mellitus with diabetic neuropathy, unspecified: Secondary | ICD-10-CM

## 2021-07-02 LAB — POCT GLYCOSYLATED HEMOGLOBIN (HGB A1C): HbA1c, POC (controlled diabetic range): 6.7 % (ref 0.0–7.0)

## 2021-07-02 NOTE — Assessment & Plan Note (Signed)
Well controlled 

## 2021-07-02 NOTE — Assessment & Plan Note (Signed)
BP Readings from Last 3 Encounters:  ?07/02/21 114/64  ?04/11/21 136/76  ?04/04/21 125/75  ? ?Well controlled continue current medications  ?

## 2021-07-02 NOTE — Assessment & Plan Note (Signed)
Stopped his gabapentin and cannot tell any change.  Will stop for good  ?

## 2021-07-02 NOTE — Assessment & Plan Note (Signed)
Well controlled continue current regimen.  

## 2021-07-02 NOTE — Progress Notes (Signed)
? ? ?  SUBJECTIVE:  ? ?CHIEF COMPLAINT / HPI:  ? ?BPH ?His urination is back to normal taking his finasteride and tamsulosin ? ?Diabetes ?No low blood sugar.  Brings in all his medications ? ?Hypertension  ?No falls no lightheadness. ? ?Gait ?Continues to have significant neuropathy symptoms of loss of feeling.  No pain.  Walks regularly,  Used to do chair yoga  ? ? ?PERTINENT  PMH / PSH: Guillan Barre history does not want to take second shingrix.  Does have as statement about increased risk ? ?OBJECTIVE:  ? ?BP 114/64   Pulse 73   Wt 150 lb (68 kg)   BMI 23.49 kg/m?   ?Heart - Regular rate and rhythm.  No murmurs, gallops or rubs.    ?Lungs:  Normal respiratory effort, chest expands symmetrically. Lungs are clear to auscultation, no crackles or wheezes. ? ? ?ASSESSMENT/PLAN:  ? ?Essential hypertension, benign ?BP Readings from Last 3 Encounters:  ?07/02/21 114/64  ?04/11/21 136/76  ?04/04/21 125/75  ? ?Well controlled continue current medications  ? ?Type 2 diabetes mellitus with diabetic neuropathy, unspecified (HCC) ?Well controlled continue current regimen  ? ?Peripheral autonomic neuropathy due to diabetes mellitus (HCC) ?Stopped his gabapentin and cannot tell any change.  Will stop for good  ? ?Diabetes mellitus (HCC) ?Well controlled  ?  ? ? ?Carney Living, MD ?[facility]  ?

## 2021-07-02 NOTE — Patient Instructions (Addendum)
Good to see you today - Thank you for coming in ? ?Things we discussed today: ? ?Work on balance consider getting back to chair yoga ? ?Please always bring your medication bottles ? ?Come back to see me in 3-6 months  ?

## 2021-07-09 ENCOUNTER — Ambulatory Visit: Payer: Medicare PPO | Admitting: Neurology

## 2021-08-18 ENCOUNTER — Other Ambulatory Visit: Payer: Self-pay | Admitting: Family Medicine

## 2021-09-08 ENCOUNTER — Other Ambulatory Visit: Payer: Self-pay | Admitting: Family Medicine

## 2021-09-09 ENCOUNTER — Other Ambulatory Visit: Payer: Self-pay | Admitting: Family Medicine

## 2021-09-24 ENCOUNTER — Other Ambulatory Visit: Payer: Self-pay | Admitting: Family Medicine

## 2021-10-02 ENCOUNTER — Encounter: Payer: Self-pay | Admitting: Family Medicine

## 2021-10-02 ENCOUNTER — Encounter: Payer: Self-pay | Admitting: *Deleted

## 2021-10-02 ENCOUNTER — Ambulatory Visit: Payer: Medicare PPO | Admitting: Family Medicine

## 2021-10-02 ENCOUNTER — Other Ambulatory Visit: Payer: Self-pay

## 2021-10-02 VITALS — BP 140/82 | HR 88 | Wt 150.4 lb

## 2021-10-02 DIAGNOSIS — K219 Gastro-esophageal reflux disease without esophagitis: Secondary | ICD-10-CM | POA: Diagnosis not present

## 2021-10-02 DIAGNOSIS — E1140 Type 2 diabetes mellitus with diabetic neuropathy, unspecified: Secondary | ICD-10-CM | POA: Diagnosis not present

## 2021-10-02 DIAGNOSIS — I10 Essential (primary) hypertension: Secondary | ICD-10-CM

## 2021-10-02 LAB — POCT GLYCOSYLATED HEMOGLOBIN (HGB A1C): HbA1c, POC (controlled diabetic range): 6.2 % (ref 0.0–7.0)

## 2021-10-02 NOTE — Assessment & Plan Note (Signed)
Not well controlled.  Had severe hypoglycemia at home.  Will lower lantus to 40 and aim for fasting 150-170.  He will also check before bed as needed

## 2021-10-02 NOTE — Assessment & Plan Note (Signed)
BP Readings from Last 3 Encounters:  10/02/21 140/82  07/02/21 114/64  04/11/21 136/76   Slightly high today Will monitor

## 2021-10-02 NOTE — Progress Notes (Signed)
    SUBJECTIVE:   CHIEF COMPLAINT / HPI:   Essential hypertension, benign Brings in his medications Is taking regularly    Type 2 diabetes mellitus with diabetic neuropathy, unspecified ([place]) Had episode of unresponsiveness with blood sugar of 34 in the afternoon about 2 weeks ago.  Could not find a cause -no change in diet or medications as far as he knows.  See photo of blood sugars.  None real low since    Peripheral autonomic neuropathy due to diabetes mellitus (HCC) No falls or pain   PERTINENT  PMH / PSH: Planning trip to [facility] in the the summer  OBJECTIVE:   BP 140/82   Pulse 88   Wt 150 lb 6.4 oz (68.2 kg)   SpO2 97%   BMI 23.56 kg/m   Psych:  Cognition and judgment appear intact. Alert, communicative  and cooperative with normal attention span and concentration. No apparent delusions, illusions, hallucinations\     ASSESSMENT/PLAN:   Essential hypertension, benign BP Readings from Last 3 Encounters:  10/02/21 140/82  07/02/21 114/64  04/11/21 136/76   Slightly high today Will monitor   Type 2 diabetes mellitus with diabetic neuropathy, unspecified ([place]) Not well controlled.  Had severe hypoglycemia at home.  Will lower lantus to 40 and aim for fasting 150-170.  He will also check before bed as needed   GASTROESOPHAGEAL REFLUX DISEASE, CHRONIC Stable.  Takes ppi 3 x per week.  No red flag symptoms of bleeding or dysphagia    Patient Instructions  Good to see you today - Thank you for coming in  Things we discussed today:  Decrease your Lantus to 40 u a day  Your fasting blood sugar 150-170,  If lower than this let me know and we will decrease your lantus   Check to see if you need a second shingles shot  Please always bring your medication bottles  Come back to see me in 3 months    Lind Covert, [place]

## 2021-10-02 NOTE — Patient Instructions (Signed)
Good to see you today - Thank you for coming in  Things we discussed today:  Decrease your Lantus to 40 u a day  Your fasting blood sugar 150-170,  If lower than this let me know and we will decrease your lantus   Check to see if you need a second shingles shot  Please always bring your medication bottles  Come back to see me in 3 months

## 2021-10-02 NOTE — Assessment & Plan Note (Signed)
Stable.  Takes ppi 3 x per week.  No red flag symptoms of bleeding or dysphagia

## 2021-10-19 ENCOUNTER — Other Ambulatory Visit: Payer: Self-pay | Admitting: Family Medicine

## 2021-11-08 DIAGNOSIS — I951 Orthostatic hypotension: Secondary | ICD-10-CM | POA: Diagnosis not present

## 2021-11-08 DIAGNOSIS — Z794 Long term (current) use of insulin: Secondary | ICD-10-CM | POA: Diagnosis not present

## 2021-11-08 DIAGNOSIS — N529 Male erectile dysfunction, unspecified: Secondary | ICD-10-CM | POA: Diagnosis not present

## 2021-11-08 DIAGNOSIS — E1142 Type 2 diabetes mellitus with diabetic polyneuropathy: Secondary | ICD-10-CM | POA: Diagnosis not present

## 2021-11-08 DIAGNOSIS — I10 Essential (primary) hypertension: Secondary | ICD-10-CM | POA: Diagnosis not present

## 2021-11-08 DIAGNOSIS — Z7984 Long term (current) use of oral hypoglycemic drugs: Secondary | ICD-10-CM | POA: Diagnosis not present

## 2021-11-08 DIAGNOSIS — N400 Enlarged prostate without lower urinary tract symptoms: Secondary | ICD-10-CM | POA: Diagnosis not present

## 2021-11-08 DIAGNOSIS — K219 Gastro-esophageal reflux disease without esophagitis: Secondary | ICD-10-CM | POA: Diagnosis not present

## 2021-11-08 DIAGNOSIS — E785 Hyperlipidemia, unspecified: Secondary | ICD-10-CM | POA: Diagnosis not present

## 2021-11-15 ENCOUNTER — Other Ambulatory Visit: Payer: Self-pay | Admitting: Family Medicine

## 2021-12-06 ENCOUNTER — Other Ambulatory Visit: Payer: Self-pay | Admitting: Family Medicine

## 2021-12-14 ENCOUNTER — Encounter: Payer: Self-pay | Admitting: Family Medicine

## 2022-01-08 ENCOUNTER — Encounter: Payer: Self-pay | Admitting: Family Medicine

## 2022-01-08 ENCOUNTER — Ambulatory Visit: Payer: Medicare PPO | Admitting: Family Medicine

## 2022-01-08 VITALS — BP 127/81 | HR 86 | Wt 148.8 lb

## 2022-01-08 DIAGNOSIS — E1140 Type 2 diabetes mellitus with diabetic neuropathy, unspecified: Secondary | ICD-10-CM | POA: Diagnosis not present

## 2022-01-08 DIAGNOSIS — Z794 Long term (current) use of insulin: Secondary | ICD-10-CM

## 2022-01-08 DIAGNOSIS — D696 Thrombocytopenia, unspecified: Secondary | ICD-10-CM

## 2022-01-08 DIAGNOSIS — E0821 Diabetes mellitus due to underlying condition with diabetic nephropathy: Secondary | ICD-10-CM | POA: Diagnosis not present

## 2022-01-08 DIAGNOSIS — I10 Essential (primary) hypertension: Secondary | ICD-10-CM | POA: Diagnosis not present

## 2022-01-08 DIAGNOSIS — E782 Mixed hyperlipidemia: Secondary | ICD-10-CM | POA: Diagnosis not present

## 2022-01-08 DIAGNOSIS — E1143 Type 2 diabetes mellitus with diabetic autonomic (poly)neuropathy: Secondary | ICD-10-CM | POA: Diagnosis not present

## 2022-01-08 DIAGNOSIS — E7800 Pure hypercholesterolemia, unspecified: Secondary | ICD-10-CM | POA: Diagnosis not present

## 2022-01-08 LAB — POCT GLYCOSYLATED HEMOGLOBIN (HGB A1C): HbA1c, POC (controlled diabetic range): 6.5 % (ref 0.0–7.0)

## 2022-01-08 MED ORDER — OMEPRAZOLE 20 MG PO CPDR: 20.0000 mg | DELAYED_RELEASE_CAPSULE | Freq: Every day | ORAL | 2 refills | Status: DC | PRN

## 2022-01-08 NOTE — Assessment & Plan Note (Signed)
BP Readings from Last 3 Encounters:  01/08/22 127/81  10/02/21 140/82  07/02/21 114/64   At goal today

## 2022-01-08 NOTE — Progress Notes (Signed)
    SUBJECTIVE:   CHIEF COMPLAINT / HPI:   Essential hypertension, benign Brings in his medications.  No Lightheadness or dizzines   Diabetes No further low blood sugar after lowering his insulin      Neuropathy Balance Feels his feet are numb and that he drags his left foot which he has done for years.  Feels his legs are not strong enough.  Lifts wts for his arms but not legs.  Does do balance exercises.  Feels he is not much worse than has been for years but not getting better  PERTINENT  PMH / PSH: GB years ago   OBJECTIVE:   BP 127/81   Pulse 86   Wt 148 lb 12.8 oz (67.5 kg)   SpO2 98%   BMI 23.31 kg/m   Alert nad Able to stand and walk without assistance an normal gait Can stand on toes and do mild deep knee bend with balance assist Unable to raise his toes bilaterally Heart - Regular rate and rhythm.  No murmurs, gallops or rubs.    Lungs:  Normal respiratory effort, chest expands symmetrically. Lungs are clear to auscultation, no crackles or wheezes.   ASSESSMENT/PLAN:   Essential hypertension, benign BP Readings from Last 3 Encounters:  01/08/22 127/81  10/02/21 140/82  07/02/21 114/64   At goal today   Diabetes mellitus (HCC) Improved control now without low blood sugars.  Continue current regimen  Peripheral autonomic neuropathy due to diabetes mellitus (HCC) I think his imbalance is primarily due to neuropathy for which he has seen neurology who did not have any suggestions.  Suggested he work on muscle strengthening of lower legs.  Offered Physical Therapy but he would like to try first on his own   Pure hypercholesterolemia Will check labs today    Patient Instructions  Good to see you today - Thank you for coming in  Things we discussed today:  Diabetes A1c is 6.5 - Great keep doing what you are  Balance and Strength Start a lower extremity strengthening program - 3 x per week to every other day   I will call you if your tests are not  good.  Otherwise, I will send you a message on MyChart (if it is active) or a letter in the mail..  If you do not hear from me with in 2 weeks please call our office.      Please always bring your medication bottles  Come back to see me in 3-6  months    Carney Living, MD [facility]

## 2022-01-08 NOTE — Assessment & Plan Note (Signed)
Will check labs today

## 2022-01-08 NOTE — Assessment & Plan Note (Signed)
Improved control now without low blood sugars.  Continue current regimen

## 2022-01-08 NOTE — Assessment & Plan Note (Signed)
I think his imbalance is primarily due to neuropathy for which he has seen neurology who did not have any suggestions.  Suggested he work on muscle strengthening of lower legs.  Offered Physical Therapy but he would like to try first on his own

## 2022-01-08 NOTE — Patient Instructions (Signed)
Good to see you today - Thank you for coming in  Things we discussed today:  Diabetes A1c is 6.5 - Great keep doing what you are  Balance and Strength Start a lower extremity strengthening program - 3 x per week to every other day   I will call you if your tests are not good.  Otherwise, I will send you a message on MyChart (if it is active) or a letter in the mail..  If you do not hear from me with in 2 weeks please call our office.      Please always bring your medication bottles  Come back to see me in 3-6  months

## 2022-01-09 LAB — CMP14+EGFR
ALT: 28 IU/L (ref 0–44)
AST: 20 IU/L (ref 0–40)
Albumin/Globulin Ratio: 2 (ref 1.2–2.2)
Albumin: 4.5 g/dL (ref 3.8–4.8)
Alkaline Phosphatase: 83 IU/L (ref 44–121)
BUN/Creatinine Ratio: 12 (ref 10–24)
BUN: 19 mg/dL (ref 8–27)
Bilirubin Total: 0.8 mg/dL (ref 0.0–1.2)
CO2: 23 mmol/L (ref 20–29)
Calcium: 9.3 mg/dL (ref 8.6–10.2)
Chloride: 102 mmol/L (ref 96–106)
Creatinine, Ser: 1.57 mg/dL — ABNORMAL HIGH (ref 0.76–1.27)
Globulin, Total: 2.3 g/dL (ref 1.5–4.5)
Glucose: 104 mg/dL — ABNORMAL HIGH (ref 70–99)
Potassium: 3.8 mmol/L (ref 3.5–5.2)
Sodium: 141 mmol/L (ref 134–144)
Total Protein: 6.8 g/dL (ref 6.0–8.5)
eGFR: 45 mL/min/{1.73_m2} — ABNORMAL LOW (ref >59)

## 2022-01-09 LAB — LIPID PANEL
Chol/HDL Ratio: 3.2 ratio (ref 0.0–5.0)
Cholesterol, Total: 98 mg/dL — ABNORMAL LOW (ref 100–199)
HDL: 31 mg/dL — ABNORMAL LOW (ref >39)
LDL Chol Calc (NIH): 39 mg/dL (ref 0–99)
Triglycerides: 168 mg/dL — ABNORMAL HIGH (ref 0–149)
VLDL Cholesterol Cal: 28 mg/dL (ref 5–40)

## 2022-01-09 LAB — CBC
Hematocrit: 40.1 % (ref 37.5–51.0)
Hemoglobin: 13.3 g/dL (ref 13.0–17.7)
MCH: 31.7 pg (ref 26.6–33.0)
MCHC: 33.2 g/dL (ref 31.5–35.7)
MCV: 96 fL (ref 79–97)
Platelets: 125 10*3/uL — ABNORMAL LOW (ref 150–450)
RBC: 4.2 x10E6/uL (ref 4.14–5.80)
RDW: 14.3 % (ref 11.6–15.4)
WBC: 5.2 10*3/uL (ref 3.4–10.8)

## 2022-01-16 ENCOUNTER — Encounter: Payer: Self-pay | Admitting: Family Medicine

## 2022-01-16 ENCOUNTER — Other Ambulatory Visit: Payer: Self-pay | Admitting: Family Medicine

## 2022-01-18 MED ORDER — MOLNUPIRAVIR EUA 200MG CAPSULE: 4.0000 | ORAL_CAPSULE | Freq: Two times a day (BID) | ORAL | 0 refills | Status: AC

## 2022-01-24 ENCOUNTER — Other Ambulatory Visit: Payer: Self-pay | Admitting: Family Medicine

## 2022-02-08 ENCOUNTER — Telehealth: Payer: Self-pay

## 2022-02-08 NOTE — Patient Outreach (Signed)
  Care Coordination   02/08/2022 Name: Tanner Mason MRN: [id number] DOB: [birth year]   Care Coordination Outreach Attempts:  An unsuccessful telephone outreach was attempted today to offer the patient information about available care coordination services as a benefit of their health plan.   Follow Up Plan:  Additional outreach attempts will be made to offer the patient care coordination information and services.   Encounter Outcome:  No Answer  Care Coordination Interventions Activated:  No   Care Coordination Interventions:  No, not indicated    Jone Baseman, RN, MSN [facility] Care Management Care Management Coordinator Direct Line [phone]

## 2022-02-25 ENCOUNTER — Telehealth: Payer: Self-pay

## 2022-02-25 NOTE — Patient Instructions (Signed)
Visit Information  Thank you for taking time to visit with me today. Please don't hesitate to contact me if I can be of assistance to you.   Following are the goals we discussed today:   Goals Addressed             This Visit's Progress    COMPLETED: Care Coordination Activities-No follow up required       Care Coordination Interventions: Advised patient to schedule annual wellness visit. Decline [facility] services at this time.          If you are experiencing a Mental Health or [place] or need someone to talk to, please call the Suicide and Crisis Lifeline: 988   Patient verbalizes understanding of instructions and care plan provided today and agrees to view in [place]. Active MyChart status and patient understanding of how to access instructions and care plan via MyChart confirmed with patient.     No further follow up required: patient decline  Jone Baseman, RN, MSN [place] [facility] Care Management Coordinator Direct Line [phone]

## 2022-02-25 NOTE — Patient Outreach (Addendum)
  Care Coordination   Initial Visit Note   02/25/2022 Name: Tanner Mason MRN: [id number] DOB: 06-26-1940  Tanner Mason is a 81 y.o. year old male who sees Chambliss, Jeb Levering, MD for primary care. I spoke with  Tanner Mason by phone today.  What matters to the patients health and wellness today?  none    Goals Addressed             This Visit's Progress    COMPLETED: Care Coordination Activities-No follow up required       Care Coordination Interventions: Advised patient to schedule annual wellness visit. Decline [facility] services at this time.          SDOH assessments and interventions completed:  Yes     Care Coordination Interventions Activated:  Yes  Care Coordination Interventions:  Yes, provided   Follow up plan: No further intervention required.   Encounter Outcome:  Pt. Visit Completed   Jone Baseman, RN, MSN [place] Management Care Management Coordinator Direct Line [phone]

## 2022-03-27 ENCOUNTER — Ambulatory Visit (INDEPENDENT_AMBULATORY_CARE_PROVIDER_SITE_OTHER): Payer: Medicare PPO

## 2022-03-27 VITALS — Ht 67.0 in | Wt 143.0 lb

## 2022-03-27 DIAGNOSIS — Z0000 Encounter for general adult medical examination without abnormal findings: Secondary | ICD-10-CM | POA: Diagnosis not present

## 2022-03-27 NOTE — Patient Instructions (Addendum)
[name],  Thank you for taking time to come for your Medicare Wellness Visit. I appreciate your ongoing commitment to your health goals. Please review the following plan we discussed and let me know if I can assist you in the future.    These are the goals we discussed:   Goals      HEMOGLOBIN A1C < 7     Last A1c 6.5 on 01/08/2022.       We also discussed recommended health maintenance. As discussed, you are due for: Health Maintenance  Topic Date Due   DTAP VACCINES (1) 08/08/1940   Diabetic kidney evaluation - Urine ACR  07/04/2015   FOOT EXAM  07/02/2018   Zoster Vaccines- Shingrix (2 of 2) 03/13/2021   COVID-19 Vaccine (7 - 2023-24 season) 04/30/2022   OPHTHALMOLOGY EXAM  05/01/2022   HEMOGLOBIN A1C  07/09/2022   Diabetic kidney evaluation - GFR measurement  01/09/2023   LIPID PANEL  01/09/2023   COLONOSCOPY (Pts 45-525yrs Insurance coverage will need to be confirmed)  01/23/2023   Medicare Annual Wellness (AWV)  03/28/2023   DTaP/Tdap/Td (3 - Td or Tdap) 11/07/2024   Pneumonia Vaccine 2865+ Years old  Completed   Hepatitis C Screening  Completed   HPV VACCINES  Aged Out   PCP apt scheduled for 04/24/2022. Fill out an advance directive.   Preventive Care 6565 Years and Older, Male Preventive care refers to lifestyle choices and visits with your health care provider that can promote health and wellness. Preventive care visits are also called wellness exams. What can I expect for my preventive care visit? Counseling During your preventive care visit, your health care provider may ask about your: Medical history, including: Past medical problems. Family medical history. History of falls. Current health, including: Emotional well-being. Home life and relationship well-being. Sexual activity. Memory and ability to understand (cognition). Lifestyle, including: Alcohol, nicotine or tobacco, and drug use. Access to firearms. Diet, exercise, and sleep habits. Work and  work [employer]. Sunscreen use. Safety issues such as seatbelt and bike helmet use. Physical exam Your health care provider will check your: Height and weight. These may be used to calculate your BMI (body mass index). BMI is a measurement that tells if you are at a healthy weight. Waist circumference. This measures the distance around your waistline. This measurement also tells if you are at a healthy weight and may help predict your risk of certain diseases, such as type 2 diabetes and high blood pressure. Heart rate and blood pressure. Body temperature. Skin for abnormal spots. What immunizations do I need?  Vaccines are usually given at various ages, according to a schedule. Your health care provider will recommend vaccines for you based on your age, medical history, and lifestyle or other factors, such as travel or where you work. What tests do I need? Screening Your health care provider may recommend screening tests for certain conditions. This may include: Lipid and cholesterol levels. Diabetes screening. This is done by checking your blood sugar (glucose) after you have not eaten for a while (fasting). Hepatitis C test. Hepatitis B test. HIV (human immunodeficiency virus) test. STI (sexually transmitted infection) testing, if you are at risk. Lung cancer screening. Colorectal cancer screening. Prostate cancer screening. Abdominal aortic aneurysm (AAA) screening. You may need this if you are a current or former smoker. Talk with your health care provider about your test results, treatment options, and if necessary, the need for more tests. Follow these instructions at home: Eating and  drinking  Eat a diet that includes fresh fruits and vegetables, whole grains, lean protein, and low-fat dairy products. Limit your intake of foods with high amounts of sugar, saturated fats, and salt. Take vitamin and mineral supplements as recommended by your health care provider. Do not drink  alcohol if your health care provider tells you not to drink. If you drink alcohol: Limit how much you have to 0-2 drinks a day. Know how much alcohol is in your drink. In the [place] one drink equals one 12 oz bottle of beer (355 mL), one 5 oz glass of wine (148 mL), or one 1 oz glass of hard liquor (44 mL). Lifestyle Brush your teeth every morning and night with fluoride toothpaste. Floss one time each day. Exercise for at least 30 minutes 5 or more days each week. Do not use any products that contain nicotine or tobacco. These products include cigarettes, chewing tobacco, and vaping devices, such as e-cigarettes. If you need help quitting, ask your health care provider. Do not use drugs. If you are sexually active, practice safe sex. Use a condom or other form of protection to prevent STIs. Take aspirin only as told by your health care provider. Make sure that you understand how much to take and what form to take. Work with your health care provider to find out whether it is safe and beneficial for you to take aspirin daily. Ask your health care provider if you need to take a cholesterol-lowering medicine (statin). Find healthy ways to manage stress, such as: Meditation, yoga, or listening to music. Journaling. Talking to a trusted person. Spending time with friends and family. Safety Always wear your seat belt while driving or riding in a vehicle. Do not drive: If you have been drinking alcohol. Do not ride with someone who has been drinking. When you are tired or distracted. While texting. If you have been using any mind-altering substances or drugs. Wear a helmet and other protective equipment during sports activities. If you have firearms in your house, make sure you follow all gun safety procedures. Minimize exposure to UV radiation to reduce your risk of skin cancer. What's next? Visit your health care provider once a year for an annual wellness visit. Ask your health care  provider how often you should have your eyes and teeth checked. Stay up to date on all vaccines. This information is not intended to replace advice given to you by your health care provider. Make sure you discuss any questions you have with your health care provider. Document Revised: 10/11/2020 Document Reviewed: 10/11/2020 Elsevier Patient Education  2023 [employer].  Fall Prevention in the Home, Adult Falls can cause injuries and affect people of all ages. There are many simple things that you can do to make your home safe and to help prevent falls. Ask for help when making these changes, if needed. What actions can I take to prevent falls? General instructions Use good lighting in all rooms. Replace any light bulbs that burn out, turn on lights if it is dark, and use night-lights. Place frequently used items in easy-to-reach places. Lower the shelves around your home if necessary. Set up furniture so that there are clear paths around it. Avoid moving your furniture around. Remove throw rugs and other tripping hazards from the floor. Avoid walking on wet floors. Fix any uneven floor surfaces. Add color or contrast paint or tape to grab bars and handrails in your home. Place contrasting color strips on the first and  last steps of staircases. When you use a stepladder, make sure that it is completely opened and that the sides and supports are firmly locked. Have someone hold the ladder while you are using it. Do not climb a closed stepladder. Know where your pets are when moving through your home. What can I do in the bathroom?     Keep the floor dry. Immediately clean up any water that is on the floor. Remove soap buildup in the tub or shower regularly. Use nonskid mats or decals on the floor of the tub or shower. Attach bath mats securely with double-sided, nonslip rug tape. If you need to sit down while you are in the shower, use a plastic, nonslip stool. Install grab bars by the  toilet and in the tub and shower. Do not use towel bars as grab bars. What can I do in the bedroom? Make sure that a bedside light is easy to reach. Do not use oversized bedding that reaches the floor. Have a firm chair that has side arms to use for getting dressed. What can I do in the kitchen? Clean up any spills right away. If you need to reach for something above you, use a sturdy step stool that has a grab bar. Keep electrical cables out of the way. Do not use floor polish or wax that makes floors slippery. If you must use wax, make sure that it is non-skid floor wax. What can I do with my stairs? Do not leave any items on the stairs. Make sure that you have a light switch at the top and the bottom of the stairs. Have them installed if you do not have them. Make sure that there are handrails on both sides of the stairs. Fix handrails that are broken or loose. Make sure that handrails are as long as the staircases. Install non-slip stair treads on all stairs in your home. Avoid having throw rugs at the top or bottom of stairs, or secure the rugs with carpet tape to prevent them from moving. Choose a carpet design that does not hide the edge of steps on the stairs. Check any carpeting to make sure that it is firmly attached to the stairs. Fix any carpet that is loose or worn. What can I do on the outside of my home? Use bright outdoor lighting. Regularly repair the edges of walkways and driveways and fix any cracks. Remove high doorway thresholds. Trim any shrubbery on the main path into your home. Regularly check that handrails are securely fastened and in good repair. Both sides of all steps should have handrails. Install guardrails along the edges of any raised decks or porches. Clear walkways of debris and clutter, including tools and rocks. Have leaves, snow, and ice cleared regularly. Use sand or salt on walkways during winter months. In the garage, clean up any spills right  away, including grease or oil spills. What other actions can I take? Wear closed-toe shoes that fit well and support your feet. Wear shoes that have rubber soles or low heels. Use mobility aids as needed, such as canes, walkers, scooters, and crutches. Review your medicines with your health care provider. Some medicines can cause dizziness or changes in blood pressure, which increase your risk of falling. Talk with your health care provider about other ways that you can decrease your risk of falls. This may include working with a physical therapist or trainer to improve your strength, balance, and endurance. Where to find more information Centers for  Disease Control and Prevention, [employer]: FootballExhibition.com.br [employer] on Aging: https://walker.com/ Contact a health care provider if: You are afraid of falling at home. You feel weak, drowsy, or dizzy at home. You fall at home. Summary There are many simple things that you can do to make your home safe and to help prevent falls. Ways to make your home safe include removing tripping hazards and installing grab bars in the bathroom. Ask for help when making these changes in your home. This information is not intended to replace advice given to you by your health care provider. Make sure you discuss any questions you have with your health care provider. Document Revised: 01/15/2021 Document Reviewed: 11/17/2019 Elsevier Patient Education  2023 [employer]   Our clinic's number is [phone]. Please call with questions or concerns about what we discussed today.

## 2022-03-27 NOTE — Progress Notes (Addendum)
Subjective:   Tanner Mason is a 81 y.o. male who presents for Medicare Annual/Subsequent preventive examination.  The patient consented to a virtual visit. Patient consented to have virtual visit and was identified by name and date of birth. Method of visit: Telephone  Encounter participants: Patient: Tanner Mason - located at Home Nurse/Provider: Dorna Bloom - located at [facility] Others (if applicable): NA  Review of Systems: Defer to PCP  Cardiac Risk Factors include: advanced age (>26men, >33 women);hypertension;male gender;diabetes mellitus;dyslipidemia  Objective:    Vitals: Ht 5\' 7"  (1.702 m)   Wt 143 lb (64.9 kg)   BMI 22.40 kg/m   Body mass index is 22.4 kg/m.     03/27/2022    9:13 AM 01/08/2022   11:04 AM 10/02/2021   10:42 AM 07/02/2021    9:58 AM 01/09/2021   10:52 AM 06/07/2020   10:05 AM 02/16/2020   10:24 AM  Advanced Directives  Does Patient Have a Medical Advance Directive? No No No No No No No  Would patient like information on creating a medical advance directive? No - Patient declined No - Patient declined No - Patient declined No - Patient declined No - Patient declined No - Patient declined No - Patient declined   Tobacco Social History   Tobacco Use  Smoking Status Former   Packs/day: 1.00   Years: 10.00   Total pack years: 10.00   Types: Cigarettes   Start date: 03-31-43   Quit date: 06/10/1967   Years since quitting: 54.8   Passive exposure: Past  Smokeless Tobacco Never     Counseling given: Remains non smoker  Clinical Intake:  Pre-visit preparation completed: Yes  Diabetic: Yes Last A1c: 6.5 on 01/08/2022 Cbg: 159 taken this morning on 03/27/2022  How often do you need to have someone help you when you read instructions, pamphlets, or other written materials from your doctor or pharmacy?: 1 - Never What is the last grade level you completed in school?: Masters in Education  Interpreter Needed?: No  Past Medical  History:  Diagnosis Date   Abdominal ultrasound, abnormal 02/2004   fatty liver   Anemia    Angioedema 07/13/2014   Arthritis    bilateral thumbs    Atypical chest pain 12/14/2013   Cataract    removed on both eyes   Cellulitis and abscess 07/14/2015   Diabetes mellitus without complication ([place])    DIASTASIS RECTI 12/08/2007   Qualifier: Diagnosis of  By: Walker Kehr MD, [place]  Present, but causing no problems    Elbow dislocation [id number]   Right   Encounter for diagnostic endoscopy 10/22/04   negative   History of esophagogastroduodenoscopy 08/13/2007   normal   Hypertension    controlled with medication    Treadmill stress test negative for angina pectoris 06/2002   poor HR and BP recovery   ULNAR NERVE ENTRAPMENT, RIGHT 06/07/2008   Qualifier: Diagnosis of  By: Walker Kehr MD, [facility]     Past Surgical History:  Procedure Laterality Date   ANTERIOR CRUCIATE LIGAMENT REPAIR  5/98   Left, staph infection complicated   BASAL CELL CARCINOMA EXCISION  02/2005   R ear   BLEPHAROPLASTY  03/2005   with ptosis repair   cataract surgery Left 2018   INGUINAL HERNIA REPAIR Right 1947   INGUINAL HERNIA REPAIR Left 1970   Family History  Problem Relation Age of Onset   Alzheimer's disease Mother        died age 21  Hypertension Mother    Aortic aneurysm Father        died age 81   Anuerysm Father    Hypertension Father    Aortic aneurysm Brother        repaired plus AI graft   Diabetes Brother    Heart disease Brother    Stroke Brother    Social History   Socioeconomic History   Marital status: Married    Spouse name: Marlowe Kays   Number of children: 2   Years of education: 16   Highest education level: Conservator, museum/gallery (e.g., MA, MS, MEng, MEd, MSW, MBA)  Occupational History   Occupation: MEDIA SPECIALIST    Employer: RETIRED  Tobacco Use   Smoking status: Former    Packs/day: 1.00    Years: 10.00    Total pack years: 10.00    Types: Cigarettes    Start date: 1941-04-25    Quit  date: 06/10/1967    Years since quitting: 54.8    Passive exposure: Past   Smokeless tobacco: Never  Vaping Use   Vaping Use: Never used  Substance and Sexual Activity   Alcohol use: Yes    Alcohol/week: 1.0 standard drink of alcohol    Types: 1 Standard drinks or equivalent per week    Comment: occasional   Drug use: No   Sexual activity: Yes    Partners: Female  Other Topics Concern   Not on file  Social History Narrative   Daughter, Jerene Pitch in [place], [place], in [place]   5 Grandchildren total.       He retired from teaching in 2010       Emergency Contact: wife Marlowe Kays   Who lives with you: wife   Any pets: none   Diet: Patient has a varied diet of vegetables, protein, and starch.  Pt reports enjoying his sweets and carbohydrates.   Exercise: Patient exercises 4x or more a week. Patient [place] Due to his balance he is no longer biking.    Pt and his wife enjoy their "communites" yoga class they attend 2x weekly. [facility]    Seatbelts: Pt reports wearing seatbelt when in vehicle. Pt wears a helmet while riding his bike.   Nancy Fetter Exposure/Protection: Pt reports wearing sun screen/hat and is under care of dermatologist.   Hobbies: [place] and [facility]. Patient and wife have lunch and/or dinners with couples at least 2x per week.    One story home.    Right handed.         Social Determinants of Health   Financial Resource Strain: Low Risk  (03/27/2022)   Overall Financial Resource Strain (CARDIA)    Difficulty of Paying Living Expenses: Not hard at all  Food Insecurity: No Food Insecurity (03/27/2022)   Hunger Vital Sign    Worried About Running Out of Food in the Last Year: Never true    Ran Out of Food in the Last Year: Never true  Transportation Needs: No Transportation Needs (03/27/2022)   PRAPARE - Hydrologist (Medical): No     Lack of Transportation (Non-Medical): No  Physical Activity: Sufficiently Active (03/27/2022)   Exercise Vital Sign    Days of Exercise per Week: 5 days    Minutes of Exercise per Session: 60 min  Stress: No Stress Concern Present (03/27/2022)   [place] -  Occupational Stress Questionnaire    Feeling of Stress : Not at all  Social Connections: Moderately Isolated (03/27/2022)   Social Connection and Isolation Panel [NHANES]    Frequency of Communication with Friends and Family: More than three times a week    Frequency of Social Gatherings with Friends and Family: More than three times a week    Attends Religious Services: Never    Marine scientist or Organizations: No    Attends Archivist Meetings: Never    Marital Status: Married   Outpatient Encounter Medications as of 03/27/2022  Medication Sig   ACCU-CHEK GUIDE test strip CHECKING FASTING ONCE DAILY   amLODipine (NORVASC) 5 MG tablet TAKE 1 TABLET BY MOUTH EVERY DAY   atorvastatin (LIPITOR) 40 MG tablet TAKE 1 TABLET BY MOUTH EVERY DAY   BD PEN NEEDLE NANO 2ND GEN 32G X 4 MM MISC USE TO INJECT TWICE INSULIN TWICE DAILY   cetirizine (ZYRTEC) 10 MG tablet Take 10 mg by mouth every evening.   EPINEPHrine (EPIPEN 2-PAK) 0.3 mg/0.3 mL IJ SOAJ injection INJECT 0.3MLS INTO THE MUSLE ONCE   finasteride (PROSCAR) 5 MG tablet TAKE 1 TABLET BY MOUTH EVERY DAY   insulin glargine (LANTUS SOLOSTAR) 100 UNIT/ML Solostar Pen Inject 40 Units into the skin daily. INJECT 50 UNITS INTO THE SKIN DAILY.   JARDIANCE 10 MG TABS tablet TAKE 1 TABLET BY MOUTH EVERY DAY   KLOR-CON M10 10 MEQ tablet TAKE 2 TABLETS BY MOUTH EVERY DAY   liraglutide (VICTOZA) 18 MG/3ML SOPN INJECT 1.8 MG UNDER THE SKIN ONCE DAILY   metoprolol tartrate (LOPRESSOR) 25 MG tablet TAKE 3 TABS BY MOUTH 2 (TWO) TIMES DAILY AT 10 AM AND 5 PM.   omeprazole (PRILOSEC) 20 MG capsule Take 1 capsule (20 mg total) by mouth daily as needed.    tamsulosin (FLOMAX) 0.4 MG CAPS capsule TAKE 2 CAPSULES BY MOUTH EVERY DAY   VITAMIN D PO Take 1,000 Units by mouth as needed.   No facility-administered encounter medications on file as of 03/27/2022.   Activities of Daily Living    03/27/2022    9:16 AM  In your present state of health, do you have any difficulty performing the following activities:  Hearing? 0  Vision? 0  Difficulty concentrating or making decisions? 0  Walking or climbing stairs? 0  Dressing or bathing? 0  Doing errands, shopping? 0  Preparing Food and eating ? N  Using the Toilet? N  In the past six months, have you accidently leaked urine? N  Do you have problems with loss of bowel control? N  Managing your Medications? N  Managing your Finances? N  Housekeeping or managing your Housekeeping? N   Patient Care Team: Lind Covert, MD as PCP - General (Family Medicine) Alda Berthold, DO as Consulting Physician (Neurology) Marygrace Drought, MD as Consulting Physician (Ophthalmology) Claudia Desanctis, MD as Consulting Physician (Nephrology) [place], Jennefer Bravo, MD as Referring Physician (Dermatology)   Assessment:   This is a routine wellness examination for [place].  Exercise Activities and Dietary recommendations Current Exercise Habits: Home exercise routine;Structured exercise class, Type of exercise: walking;yoga, Time (Minutes): 50, Frequency (Times/Week): 5, Weekly Exercise (Minutes/Week): 250, Exercise limited by: cardiac condition(s)   Goals      HEMOGLOBIN A1C < 7     Last A1c 6.5 on 01/08/2022.       Fall Risk    03/27/2022    9:15 AM 01/08/2022   11:04 AM  10/02/2021   10:41 AM 07/02/2021    9:57 AM 01/09/2021   10:52 AM  Fall Risk   Falls in the past year? 1 1 0 0 0  Number falls in past yr: 1 0 0  0  Injury with Fall? 0 0 0  0  Risk for fall due to : Impaired balance/gait      Follow up Falls prevention discussed       Patient does report one fall this year where he lost  his balance. He reports no injury. Patient does not use assistive devices to ambulate.   Is the patient's home free of loose throw rugs in walkways, pet beds, electrical cords, etc?   yes      Grab bars in the bathroom? yes      Handrails on the stairs?   yes      Adequate lighting?   yes  Patient rating of health (0-10): 9   Depression Screen    03/27/2022    9:14 AM 02/25/2022    9:39 AM 07/02/2021    9:57 AM 01/09/2021   10:52 AM  PHQ 2/9 Scores  PHQ - 2 Score 0 0 0 0  PHQ- 9 Score   0 0   Cognitive Function    06/28/2013    3:00 PM 01/21/2012    4:00 PM 11/29/2010   11:00 AM  MMSE - Mini Mental State Exam  Orientation to time 5 5 5   Orientation to Place 5 5 5   Registration 3 3 3   Attention/ Calculation 5 5 5   Recall 3 3 3   Language- name 2 objects 2 2 2   Language- repeat 1 1 1   Language- follow 3 step command 3 3 3   Language- read & follow direction 1 1 1   Write a sentence 1 1 1   Copy design 1 1 1   Total score 30 30 30       03/27/2022    9:19 AM 09/23/2018    2:29 PM  6CIT Screen  What Year? 0 points 0 points  What month? 0 points 0 points  What time? 0 points 0 points  Count back from 20 0 points 0 points  Months in reverse 0 points 0 points  Repeat phrase 0 points 0 points  Total Score 0 points 0 points   Immunization History  Administered Date(s) Administered   COVID-19, mRNA, vaccine(Comirnaty)12 years and older 03/05/2022   Influenza Split 01/21/2012   Influenza Whole 12/28/2005, 01/27/2008, 01/31/2009, 03/06/2010   Influenza, High Dose Seasonal PF 01/17/2015   Influenza-Unspecified 12/28/2012, 01/31/2014   PFIZER Comirnaty(Gray Top)Covid-19 Tri-Sucrose Vaccine 08/31/2020   PFIZER(Purple Top)SARS-COV-2 Vaccination 06/05/2019, 06/26/2019, 01/25/2020   Pfizer Covid-19 Vaccine Bivalent Booster 24yrs & up 01/16/2021   Pneumococcal Conjugate-13 11/08/2014   Pneumococcal Polysaccharide-23 02/28/1996, 03/23/1996, 04/04/2009   Td 03/29/2004   Tdap 11/08/2014    Unspecified SARS-COV-2 Vaccination 03/05/2022   Zoster Recombinat (Shingrix) 01/16/2021   Zoster, Live 12/06/2009   Screening Tests Health Maintenance  Topic Date Due   DTAP VACCINES (1) 08/08/1940   Diabetic kidney evaluation - Urine ACR  07/04/2015   FOOT EXAM  07/02/2018   Medicare Annual Wellness (AWV)  09/23/2019   Zoster Vaccines- Shingrix (2 of 2) 03/13/2021   COVID-19 Vaccine (7 - 2023-24 season) 04/30/2022   OPHTHALMOLOGY EXAM  05/01/2022   HEMOGLOBIN A1C  07/09/2022   Diabetic kidney evaluation - GFR measurement  01/09/2023   LIPID PANEL  01/09/2023   COLONOSCOPY (Pts 45-43yrs Insurance coverage will need  to be confirmed)  01/23/2023   DTaP/Tdap/Td (3 - Td or Tdap) 11/07/2024   Pneumonia Vaccine 23+ Years old  Completed   Hepatitis C Screening  Completed   HPV VACCINES  Aged Out   Cancer Screenings: Lung: Low Dose CT Chest recommended if Age 65-80 years, 30 pack-year currently smoking OR have quit w/in 15years. Patient does not qualify. Colorectal: UTD 01/22/2018  Additional Screenings: HIV Screening: Completed  Hepatitis C: Completed    Plan:  PCP apt scheduled for 04/24/2022. Fill out an advance directive.   I have personally reviewed and noted the following in the patient's chart:   Medical and social history Use of alcohol, tobacco or illicit drugs  Current medications and supplements Functional ability and status Nutritional status Physical activity Advanced directives List of other physicians Hospitalizations, surgeries, and ER visits in previous 12 months Vitals Screenings to include cognitive, depression, and falls Referrals and appointments  In addition, I have reviewed and discussed with patient certain preventive protocols, quality metrics, and best practice recommendations. A written personalized care plan for preventive services as well as general preventive health recommendations were provided to patient.  Steva Colder,  CMA  03/27/2022    I have reviewed this visit and agree with the documentation.  Marshall L Chambliss

## 2022-04-24 ENCOUNTER — Encounter: Payer: Self-pay | Admitting: Family Medicine

## 2022-04-24 ENCOUNTER — Other Ambulatory Visit: Payer: Self-pay

## 2022-04-24 ENCOUNTER — Ambulatory Visit: Payer: Medicare PPO | Admitting: Family Medicine

## 2022-04-24 VITALS — BP 127/75 | HR 84 | Wt 147.2 lb

## 2022-04-24 DIAGNOSIS — R2689 Other abnormalities of gait and mobility: Secondary | ICD-10-CM | POA: Diagnosis not present

## 2022-04-24 DIAGNOSIS — I10 Essential (primary) hypertension: Secondary | ICD-10-CM | POA: Diagnosis not present

## 2022-04-24 DIAGNOSIS — E1140 Type 2 diabetes mellitus with diabetic neuropathy, unspecified: Secondary | ICD-10-CM

## 2022-04-24 LAB — POCT GLYCOSYLATED HEMOGLOBIN (HGB A1C): HbA1c, POC (controlled diabetic range): 7.4 % — AB (ref 0.0–7.0)

## 2022-04-24 NOTE — Assessment & Plan Note (Signed)
Worsening A1c but given age not sure risk of hypoglycemia is worth possible benefit of changing medications.  Asked him to discuss with his nephrologist about increasing Jardiance. Other wise contiune current medications

## 2022-04-24 NOTE — Assessment & Plan Note (Signed)
BP Readings from Last 3 Encounters:  04/24/22 127/75  01/08/22 127/81  10/02/21 140/82   Well controlled

## 2022-04-24 NOTE — Patient Instructions (Signed)
Good to see you today - Thank you for coming in  Things we discussed today:  Ask your nephrologist about possibly increasing your Jardiance to 25 mg   Keep exercising and walking for the balance   Your goal blood pressure is less than 135/85.  Check your blood pressure several times a week.  If regularly higher than this please let me know - either with MyChart or leaving a phone message. Next visit please bring in your blood pressure cuff.     Please always bring your medication bottles  Come back to see me in 3 months

## 2022-04-24 NOTE — Assessment & Plan Note (Signed)
Due to neuropathy.  Stable symptoms.  Recommend regular weight bearing exercise for muscle strengthening and balance stressing

## 2022-04-24 NOTE — Progress Notes (Signed)
    SUBJECTIVE:   CHIEF COMPLAINT / HPI:   Essential hypertension, benign Brings in his medications.  No Lightheadness or dizzines     Diabetes Fastings range from 68-220s    Neuropathy Balance About the same is exercising but not every day.  No falls   OBJECTIVE:   BP 127/75   Pulse 84   Wt 147 lb 3.2 oz (66.8 kg)   SpO2 99%   BMI 23.05 kg/m   Has all his medications   ASSESSMENT/PLAN:   Type 2 diabetes mellitus with diabetic neuropathy, unspecified whether long term insulin use (HCC) Assessment & Plan: Worsening A1c but given age not sure risk of hypoglycemia is worth possible benefit of changing medications.  Asked him to discuss with his nephrologist about increasing Jardiance. Other wise contiune current medications   Orders: -     POCT glycosylated hemoglobin (Hb A1C)  Essential hypertension, benign Assessment & Plan: BP Readings from Last 3 Encounters:  04/24/22 127/75  01/08/22 127/81  10/02/21 140/82   Well controlled    Imbalance Assessment & Plan: Due to neuropathy.  Stable symptoms.  Recommend regular weight bearing exercise for muscle strengthening and balance stressing         Patient Instructions  Good to see you today - Thank you for coming in  Things we discussed today:  Ask your nephrologist about possibly increasing your Jardiance to 25 mg   Keep exercising and walking for the balance   Your goal blood pressure is less than 135/85.  Check your blood pressure several times a week.  If regularly higher than this please let me know - either with MyChart or leaving a phone message. Next visit please bring in your blood pressure cuff.     Please always bring your medication bottles  Come back to see me in 3 months    Carney Living, MD [facility]

## 2022-05-01 DIAGNOSIS — H02403 Unspecified ptosis of bilateral eyelids: Secondary | ICD-10-CM | POA: Diagnosis not present

## 2022-05-01 DIAGNOSIS — Z961 Presence of intraocular lens: Secondary | ICD-10-CM | POA: Diagnosis not present

## 2022-05-01 DIAGNOSIS — H52203 Unspecified astigmatism, bilateral: Secondary | ICD-10-CM | POA: Diagnosis not present

## 2022-05-01 DIAGNOSIS — H43813 Vitreous degeneration, bilateral: Secondary | ICD-10-CM | POA: Diagnosis not present

## 2022-05-01 DIAGNOSIS — E119 Type 2 diabetes mellitus without complications: Secondary | ICD-10-CM | POA: Diagnosis not present

## 2022-05-07 DIAGNOSIS — D485 Neoplasm of uncertain behavior of skin: Secondary | ICD-10-CM | POA: Diagnosis not present

## 2022-05-07 DIAGNOSIS — Z85828 Personal history of other malignant neoplasm of skin: Secondary | ICD-10-CM | POA: Diagnosis not present

## 2022-05-07 DIAGNOSIS — C44229 Squamous cell carcinoma of skin of left ear and external auricular canal: Secondary | ICD-10-CM | POA: Diagnosis not present

## 2022-05-07 DIAGNOSIS — L578 Other skin changes due to chronic exposure to nonionizing radiation: Secondary | ICD-10-CM | POA: Diagnosis not present

## 2022-05-07 DIAGNOSIS — L820 Inflamed seborrheic keratosis: Secondary | ICD-10-CM | POA: Diagnosis not present

## 2022-05-07 DIAGNOSIS — Z86018 Personal history of other benign neoplasm: Secondary | ICD-10-CM | POA: Diagnosis not present

## 2022-05-07 DIAGNOSIS — L814 Other melanin hyperpigmentation: Secondary | ICD-10-CM | POA: Diagnosis not present

## 2022-05-07 DIAGNOSIS — D225 Melanocytic nevi of trunk: Secondary | ICD-10-CM | POA: Diagnosis not present

## 2022-05-07 DIAGNOSIS — L570 Actinic keratosis: Secondary | ICD-10-CM | POA: Diagnosis not present

## 2022-05-07 DIAGNOSIS — L821 Other seborrheic keratosis: Secondary | ICD-10-CM | POA: Diagnosis not present

## 2022-05-13 DIAGNOSIS — N1832 Chronic kidney disease, stage 3b: Secondary | ICD-10-CM | POA: Diagnosis not present

## 2022-05-20 DIAGNOSIS — N1832 Chronic kidney disease, stage 3b: Secondary | ICD-10-CM | POA: Diagnosis not present

## 2022-05-20 DIAGNOSIS — N2581 Secondary hyperparathyroidism of renal origin: Secondary | ICD-10-CM | POA: Diagnosis not present

## 2022-05-20 DIAGNOSIS — T783XXA Angioneurotic edema, initial encounter: Secondary | ICD-10-CM | POA: Diagnosis not present

## 2022-05-20 DIAGNOSIS — E1122 Type 2 diabetes mellitus with diabetic chronic kidney disease: Secondary | ICD-10-CM | POA: Diagnosis not present

## 2022-05-20 DIAGNOSIS — I129 Hypertensive chronic kidney disease with stage 1 through stage 4 chronic kidney disease, or unspecified chronic kidney disease: Secondary | ICD-10-CM | POA: Diagnosis not present

## 2022-05-21 ENCOUNTER — Encounter: Payer: Self-pay | Admitting: Family Medicine

## 2022-05-22 DIAGNOSIS — C44229 Squamous cell carcinoma of skin of left ear and external auricular canal: Secondary | ICD-10-CM | POA: Diagnosis not present

## 2022-06-04 ENCOUNTER — Other Ambulatory Visit: Payer: Self-pay | Admitting: Family Medicine

## 2022-06-09 ENCOUNTER — Other Ambulatory Visit: Payer: Self-pay | Admitting: Family Medicine

## 2022-06-18 DIAGNOSIS — Z5189 Encounter for other specified aftercare: Secondary | ICD-10-CM | POA: Diagnosis not present

## 2022-06-22 ENCOUNTER — Other Ambulatory Visit: Payer: Self-pay | Admitting: Family Medicine

## 2022-06-25 ENCOUNTER — Encounter: Payer: Self-pay | Admitting: Family Medicine

## 2022-06-25 DIAGNOSIS — E1140 Type 2 diabetes mellitus with diabetic neuropathy, unspecified: Secondary | ICD-10-CM

## 2022-06-25 MED ORDER — EMPAGLIFLOZIN 25 MG PO TABS
25.0000 mg | ORAL_TABLET | Freq: Every day | ORAL | 3 refills | Status: DC
  Filled 2023-03-20: qty 90, 90d supply, fill #0

## 2022-07-20 ENCOUNTER — Other Ambulatory Visit: Payer: Self-pay | Admitting: Family Medicine

## 2022-07-23 ENCOUNTER — Encounter: Payer: Self-pay | Admitting: Family Medicine

## 2022-07-23 ENCOUNTER — Ambulatory Visit: Payer: Medicare PPO | Admitting: Family Medicine

## 2022-07-23 ENCOUNTER — Other Ambulatory Visit: Payer: Self-pay | Admitting: *Deleted

## 2022-07-23 VITALS — BP 106/62 | HR 72 | Wt 144.0 lb

## 2022-07-23 DIAGNOSIS — I10 Essential (primary) hypertension: Secondary | ICD-10-CM

## 2022-07-23 DIAGNOSIS — E1140 Type 2 diabetes mellitus with diabetic neuropathy, unspecified: Secondary | ICD-10-CM

## 2022-07-23 DIAGNOSIS — E1122 Type 2 diabetes mellitus with diabetic chronic kidney disease: Secondary | ICD-10-CM | POA: Diagnosis not present

## 2022-07-23 DIAGNOSIS — R2689 Other abnormalities of gait and mobility: Secondary | ICD-10-CM | POA: Diagnosis not present

## 2022-07-23 DIAGNOSIS — N183 Chronic kidney disease, stage 3 unspecified: Secondary | ICD-10-CM

## 2022-07-23 LAB — POCT GLYCOSYLATED HEMOGLOBIN (HGB A1C): HbA1c, POC (controlled diabetic range): 7.3 % — AB (ref 0.0–7.0)

## 2022-07-23 MED ORDER — FINASTERIDE 5 MG PO TABS
5.0000 mg | ORAL_TABLET | Freq: Every day | ORAL | 3 refills | Status: DC
  Filled 2023-03-16: qty 90, 90d supply, fill #0
  Filled 2023-06-11: qty 90, 90d supply, fill #1

## 2022-07-23 NOTE — Assessment & Plan Note (Signed)
Well controlled continue current medications  

## 2022-07-23 NOTE — Assessment & Plan Note (Signed)
Well controlled given episode of probable severe hypoglycemia in last year.  Continue current medications

## 2022-07-23 NOTE — Assessment & Plan Note (Signed)
Recently had blood work by renal.  Will check again next visit.  Reviewed avoiding nsaids and dehydration

## 2022-07-23 NOTE — Patient Instructions (Signed)
Good to see you today - Thank you for coming in  Things we discussed today:  Keep taking all medications as you are  Work on balance and strength every day  Please always bring your medication bottles  Come back to see me in 3 months

## 2022-07-23 NOTE — Assessment & Plan Note (Signed)
Discussed multfactorial nature of balance and recommended regular exercise and strengthening

## 2022-07-23 NOTE — Progress Notes (Signed)
    SUBJECTIVE:   CHIEF COMPLAINT / HPI:   Essential hypertension, benign Brings in his medications.  No Lightheadness or dizzines or edema    Diabetes Lowest blood sugar was 70.  No further nonresponsive episodes that seemed to be due to hypoglycemia.   Neuropathy Balance He is exercising but not "diligently"    OBJECTIVE:   BP 106/62   Pulse 72   Wt 144 lb (65.3 kg)   SpO2 97%   BMI 22.55 kg/m   Mobility:able to get up and down from exam table without assistance or distress Heart - Regular rate and rhythm.  No murmurs, gallops or rubs.    Lungs:  Normal respiratory effort, chest expands symmetrically. Lungs are clear to auscultation, no crackles or wheezes. Extremities:  No cyanosis, edema, or deformity noted with good range of motion of all major joints.     ASSESSMENT/PLAN:   Type 2 diabetes mellitus with diabetic neuropathy, unspecified whether long term insulin use (HCC) Assessment & Plan: Well controlled given episode of probable severe hypoglycemia in last year.  Continue current medications   Orders: -     POCT glycosylated hemoglobin (Hb A1C)  Essential hypertension, benign Assessment & Plan: Well controlled continue current medications    Stage 3 chronic renal impairment associated with type 2 diabetes mellitus ([place]) Assessment & Plan: Recently had blood work by renal.  Will check again next visit.  Reviewed avoiding nsaids and dehydration    Imbalance Assessment & Plan: Discussed multfactorial nature of balance and recommended regular exercise and strengthening       Patient Instructions  Good to see you today - Thank you for coming in  Things we discussed today:  Keep taking all medications as you are  Work on balance and strength every day  Please always bring your medication bottles  Come back to see me in 3 months    Lind Covert, [place]

## 2022-09-13 ENCOUNTER — Other Ambulatory Visit: Payer: Self-pay | Admitting: Family Medicine

## 2022-09-16 ENCOUNTER — Other Ambulatory Visit (HOSPITAL_BASED_OUTPATIENT_CLINIC_OR_DEPARTMENT_OTHER): Payer: Self-pay

## 2022-09-16 MED ORDER — LIRAGLUTIDE 18 MG/3ML ~~LOC~~ SOPN
1.8000 mg | PEN_INJECTOR | Freq: Every day | SUBCUTANEOUS | 11 refills | Status: DC
  Filled 2022-09-16: qty 9, 30d supply, fill #0
  Filled 2022-10-17: qty 9, 30d supply, fill #1
  Filled 2022-11-11 – 2022-11-12 (×2): qty 3, 10d supply, fill #2
  Filled 2022-11-12: qty 6, 20d supply, fill #2
  Filled 2022-12-09: qty 9, 30d supply, fill #3

## 2022-09-17 ENCOUNTER — Other Ambulatory Visit (HOSPITAL_BASED_OUTPATIENT_CLINIC_OR_DEPARTMENT_OTHER): Payer: Self-pay

## 2022-09-18 ENCOUNTER — Other Ambulatory Visit (HOSPITAL_BASED_OUTPATIENT_CLINIC_OR_DEPARTMENT_OTHER): Payer: Self-pay

## 2022-10-02 ENCOUNTER — Other Ambulatory Visit: Payer: Self-pay

## 2022-10-02 ENCOUNTER — Ambulatory Visit: Payer: Medicare PPO | Admitting: Family Medicine

## 2022-10-02 VITALS — BP 124/72 | HR 80 | Ht 67.0 in | Wt 145.4 lb

## 2022-10-02 DIAGNOSIS — I10 Essential (primary) hypertension: Secondary | ICD-10-CM | POA: Diagnosis not present

## 2022-10-02 DIAGNOSIS — R2689 Other abnormalities of gait and mobility: Secondary | ICD-10-CM

## 2022-10-02 DIAGNOSIS — E1140 Type 2 diabetes mellitus with diabetic neuropathy, unspecified: Secondary | ICD-10-CM | POA: Diagnosis not present

## 2022-10-02 LAB — POCT GLYCOSYLATED HEMOGLOBIN (HGB A1C): HbA1c, POC (controlled diabetic range): 7.4 % — AB (ref 0.0–7.0)

## 2022-10-02 NOTE — Assessment & Plan Note (Signed)
Good control.  Continue insulin and victoza empagliflozin

## 2022-10-02 NOTE — Assessment & Plan Note (Signed)
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  Encouraged Physical Therapy and trial of cane.  He will consider

## 2022-10-02 NOTE — Patient Instructions (Addendum)
Good to see you today - Thank you for coming in  Things we discussed today:  Balance - consider Physical Therapy as an assessment and coaching - Walk and safely stress your balance at least every other day - Try a cane to see if would help on uneven surfaces  Let me know if you want to see a urologist   Please always bring your medication bottles  Come back to see me in 3-6 months

## 2022-10-02 NOTE — Assessment & Plan Note (Signed)
At goal continue current medications - metoprolol amlodipine

## 2022-10-02 NOTE — Progress Notes (Signed)
    SUBJECTIVE:   CHIEF COMPLAINT / HPI:   Hypertension Brings medications.  No edema or lightheadness   Diabetes No low blood sugars.  Knows all  his medications   Neuropathy Balance  [place]  Just scuffed his foot and went down.  Does not use a cane.  Exercises some but not real regularly.  Does not particularly want to see Physical Therapy.  Has all the exercises    OBJECTIVE:   BP 124/72   Pulse 80   Ht 5\' 7"  (1.702 m)   Wt 145 lb 6.4 oz (66 kg)   SpO2 98%   BMI 22.77 kg/m   Able to stand turn sit without problems but slightly hesitant  ASSESSMENT/PLAN:   Type 2 diabetes mellitus with diabetic neuropathy, unspecified whether long term insulin use (HCC) Assessment & Plan: Good control.  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  Encouraged Physical Therapy and trial of cane.  He will consider       Patient Instructions  Good to see you today - Thank you for coming in  Things we discussed today:  Balance - consider Physical Therapy as an assessment and coaching - Walk and safely stress your balance at least every other day - Try a cane to see if would help on uneven surfaces  Let me know if you want to see a urologist   Please always bring your medication bottles  Come back to see me in 3-6 months    Carney Living, MD [facility]

## 2022-10-17 ENCOUNTER — Other Ambulatory Visit (HOSPITAL_BASED_OUTPATIENT_CLINIC_OR_DEPARTMENT_OTHER): Payer: Self-pay

## 2022-10-18 ENCOUNTER — Other Ambulatory Visit (HOSPITAL_BASED_OUTPATIENT_CLINIC_OR_DEPARTMENT_OTHER): Payer: Self-pay

## 2022-11-02 ENCOUNTER — Other Ambulatory Visit: Payer: Self-pay | Admitting: Family Medicine

## 2022-11-11 ENCOUNTER — Other Ambulatory Visit: Payer: Self-pay

## 2022-11-11 ENCOUNTER — Other Ambulatory Visit (HOSPITAL_BASED_OUTPATIENT_CLINIC_OR_DEPARTMENT_OTHER): Payer: Self-pay

## 2022-11-11 DIAGNOSIS — N1832 Chronic kidney disease, stage 3b: Secondary | ICD-10-CM | POA: Diagnosis not present

## 2022-11-12 ENCOUNTER — Other Ambulatory Visit: Payer: Self-pay | Admitting: Family Medicine

## 2022-11-12 ENCOUNTER — Other Ambulatory Visit (HOSPITAL_BASED_OUTPATIENT_CLINIC_OR_DEPARTMENT_OTHER): Payer: Self-pay

## 2022-11-19 DIAGNOSIS — N2581 Secondary hyperparathyroidism of renal origin: Secondary | ICD-10-CM | POA: Diagnosis not present

## 2022-11-19 DIAGNOSIS — N1832 Chronic kidney disease, stage 3b: Secondary | ICD-10-CM | POA: Diagnosis not present

## 2022-11-19 DIAGNOSIS — E1122 Type 2 diabetes mellitus with diabetic chronic kidney disease: Secondary | ICD-10-CM | POA: Diagnosis not present

## 2022-11-19 DIAGNOSIS — I129 Hypertensive chronic kidney disease with stage 1 through stage 4 chronic kidney disease, or unspecified chronic kidney disease: Secondary | ICD-10-CM | POA: Diagnosis not present

## 2022-11-19 DIAGNOSIS — T783XXA Angioneurotic edema, initial encounter: Secondary | ICD-10-CM | POA: Diagnosis not present

## 2022-12-09 ENCOUNTER — Other Ambulatory Visit (HOSPITAL_BASED_OUTPATIENT_CLINIC_OR_DEPARTMENT_OTHER): Payer: Self-pay

## 2022-12-10 ENCOUNTER — Other Ambulatory Visit: Payer: Self-pay

## 2022-12-10 ENCOUNTER — Other Ambulatory Visit (HOSPITAL_BASED_OUTPATIENT_CLINIC_OR_DEPARTMENT_OTHER): Payer: Self-pay

## 2022-12-13 ENCOUNTER — Ambulatory Visit: Payer: Medicare PPO

## 2022-12-13 VITALS — Ht 67.0 in | Wt 145.0 lb

## 2022-12-13 DIAGNOSIS — Z0000 Encounter for general adult medical examination without abnormal findings: Secondary | ICD-10-CM

## 2022-12-13 NOTE — Patient Instructions (Signed)
[name] , Thank you for taking time to come for your Medicare Wellness Visit. I appreciate your ongoing commitment to your health goals. Please review the following plan we discussed and let me know if I can assist you in the future.   Referrals/Orders/Follow-Ups/Clinician Recommendations: Aim for 30 minutes of exercise or brisk walking, 6-8 glasses of water, and 5 servings of fruits and vegetables each day.  This is a list of the screening recommended for you and due dates:  Health Maintenance  Topic Date Due   Yearly kidney health urinalysis for diabetes  07/04/2015   Complete foot exam   07/02/2018   COVID-19 Vaccine (6 - 2023-24 season) 07/04/2022   Yearly kidney function blood test for diabetes  01/09/2023   Colon Cancer Screening  01/23/2023   Lipid (cholesterol) test  01/09/2023   Hemoglobin A1C  04/03/2023   Eye exam for diabetics  05/02/2023   Medicare Annual Wellness Visit  12/13/2023   DTaP/Tdap/Td vaccine (3 - Td or Tdap) 11/07/2024   Pneumonia Vaccine  Completed   HPV Vaccine  Aged Out   Hepatitis C Screening  Discontinued   Zoster (Shingles) Vaccine  Discontinued    Advanced directives: (ACP Link)Information on Advanced Care Planning can be found at [facility] [place] Advance Health Care Directives Advance Health Care Directives ([email or web address]   Next Medicare Annual Wellness Visit scheduled for next year: Yes  Preventive Care 65 Years and Older, Male  Preventive care refers to lifestyle choices and visits with your health care provider that can promote health and wellness. What does preventive care include? A yearly physical exam. This is also called an annual well check. Dental exams once or twice a year. Routine eye exams. Ask your health care provider how often you should have your eyes checked. Personal lifestyle choices, including: Daily care of your teeth and gums. Regular physical activity. Eating a healthy diet. Avoiding tobacco and drug  use. Limiting alcohol use. Practicing safe sex. Taking low doses of aspirin every day. Taking vitamin and mineral supplements as recommended by your health care provider. What happens during an annual well check? The services and screenings done by your health care provider during your annual well check will depend on your age, overall health, lifestyle risk factors, and family history of disease. Counseling  Your health care provider may ask you questions about your: Alcohol use. Tobacco use. Drug use. Emotional well-being. Home and relationship well-being. Sexual activity. Eating habits. History of falls. Memory and ability to understand (cognition). Work and work Astronomer. Screening  You may have the following tests or measurements: Height, weight, and BMI. Blood pressure. Lipid and cholesterol levels. These may be checked every 5 years, or more frequently if you are over 63 years old. Skin check. Lung cancer screening. You may have this screening every year starting at age 80 if you have a 30-pack-year history of smoking and currently smoke or have quit within the past 15 years. Fecal occult blood test (FOBT) of the stool. You may have this test every year starting at age 48. Flexible sigmoidoscopy or colonoscopy. You may have a sigmoidoscopy every 5 years or a colonoscopy every 10 years starting at age 2. Prostate cancer screening. Recommendations will vary depending on your family history and other risks. Hepatitis C blood test. Hepatitis B blood test. Sexually transmitted disease (STD) testing. Diabetes screening. This is done by checking your blood sugar (glucose) after you have not eaten for a while (fasting). You may have  this done every 1-3 years. Abdominal aortic aneurysm (AAA) screening. You may need this if you are a current or former smoker. Osteoporosis. You may be screened starting at age 9 if you are at high risk. Talk with your health care provider about  your test results, treatment options, and if necessary, the need for more tests. Vaccines  Your health care provider may recommend certain vaccines, such as: Influenza vaccine. This is recommended every year. Tetanus, diphtheria, and acellular pertussis (Tdap, Td) vaccine. You may need a Td booster every 10 years. Zoster vaccine. You may need this after age 71. Pneumococcal 13-valent conjugate (PCV13) vaccine. One dose is recommended after age 55. Pneumococcal polysaccharide (PPSV23) vaccine. One dose is recommended after age 19. Talk to your health care provider about which screenings and vaccines you need and how often you need them. This information is not intended to replace advice given to you by your health care provider. Make sure you discuss any questions you have with your health care provider. Document Released: 05/12/2015 Document Revised: 01/03/2016 Document Reviewed: 02/14/2015 Elsevier Interactive Patient Education  2017 [employer].  Fall Prevention in the Home Falls can cause injuries. They can happen to people of all ages. There are many things you can do to make your home safe and to help prevent falls. What can I do on the outside of my home? Regularly fix the edges of walkways and driveways and fix any cracks. Remove anything that might make you trip as you walk through a door, such as a raised step or threshold. Trim any bushes or trees on the path to your home. Use bright outdoor lighting. Clear any walking paths of anything that might make someone trip, such as rocks or tools. Regularly check to see if handrails are loose or broken. Make sure that both sides of any steps have handrails. Any raised decks and porches should have guardrails on the edges. Have any leaves, snow, or ice cleared regularly. Use sand or salt on walking paths during winter. Clean up any spills in your garage right away. This includes oil or grease spills. What can I do in the bathroom? Use  night lights. Install grab bars by the toilet and in the tub and shower. Do not use towel bars as grab bars. Use non-skid mats or decals in the tub or shower. If you need to sit down in the shower, use a plastic, non-slip stool. Keep the floor dry. Clean up any water that spills on the floor as soon as it happens. Remove soap buildup in the tub or shower regularly. Attach bath mats securely with double-sided non-slip rug tape. Do not have throw rugs and other things on the floor that can make you trip. What can I do in the bedroom? Use night lights. Make sure that you have a light by your bed that is easy to reach. Do not use any sheets or blankets that are too big for your bed. They should not hang down onto the floor. Have a firm chair that has side arms. You can use this for support while you get dressed. Do not have throw rugs and other things on the floor that can make you trip. What can I do in the kitchen? Clean up any spills right away. Avoid walking on wet floors. Keep items that you use a lot in easy-to-reach places. If you need to reach something above you, use a strong step stool that has a grab bar. Keep electrical cords out  of the way. Do not use floor polish or wax that makes floors slippery. If you must use wax, use non-skid floor wax. Do not have throw rugs and other things on the floor that can make you trip. What can I do with my stairs? Do not leave any items on the stairs. Make sure that there are handrails on both sides of the stairs and use them. Fix handrails that are broken or loose. Make sure that handrails are as long as the stairways. Check any carpeting to make sure that it is firmly attached to the stairs. Fix any carpet that is loose or worn. Avoid having throw rugs at the top or bottom of the stairs. If you do have throw rugs, attach them to the floor with carpet tape. Make sure that you have a light switch at the top of the stairs and the bottom of the  stairs. If you do not have them, ask someone to add them for you. What else can I do to help prevent falls? Wear shoes that: Do not have high heels. Have rubber bottoms. Are comfortable and fit you well. Are closed at the toe. Do not wear sandals. If you use a stepladder: Make sure that it is fully opened. Do not climb a closed stepladder. Make sure that both sides of the stepladder are locked into place. Ask someone to hold it for you, if possible. Clearly mark and make sure that you can see: Any grab bars or handrails. First and last steps. Where the edge of each step is. Use tools that help you move around (mobility aids) if they are needed. These include: Canes. Walkers. Scooters. Crutches. Turn on the lights when you go into a dark area. Replace any light bulbs as soon as they burn out. Set up your furniture so you have a clear path. Avoid moving your furniture around. If any of your floors are uneven, fix them. If there are any pets around you, be aware of where they are. Review your medicines with your doctor. Some medicines can make you feel dizzy. This can increase your chance of falling. Ask your doctor what other things that you can do to help prevent falls. This information is not intended to replace advice given to you by your health care provider. Make sure you discuss any questions you have with your health care provider. Document Released: 02/09/2009 Document Revised: 09/21/2015 Document Reviewed: 05/20/2014 Elsevier Interactive Patient Education  2017 [employer].

## 2022-12-13 NOTE — Progress Notes (Signed)
Subjective:   Tanner Mason is a 82 y.o. male who presents for Medicare Annual/Subsequent preventive examination.  Visit Complete: Virtual  I connected with  Tanner Mason on 12/13/22 by a audio enabled telemedicine application and verified that I am speaking with the correct person using two identifiers.  Patient Location: Home  Provider Location: Home Office  I discussed the limitations of evaluation and management by telemedicine. The patient expressed understanding and agreed to proceed.  Vital Signs: Because this visit was a virtual/telehealth visit, some criteria may be missing or patient reported. Any vitals not documented were not able to be obtained and vitals that have been documented are patient reported.   Review of Systems     Cardiac Risk Factors include: advanced age (>39men, >60 women);hypertension;male gender;dyslipidemia;diabetes mellitus     Objective:    Today's Vitals   12/13/22 1409  Weight: 145 lb (65.8 kg)  Height: 5\' 7"  (1.702 m)   Body mass index is 22.71 kg/m.     12/13/2022    2:11 PM 10/02/2022    9:53 AM 07/23/2022   10:13 AM 04/24/2022    9:12 AM 03/27/2022    9:13 AM 01/08/2022   11:04 AM 10/02/2021   10:42 AM  Advanced Directives  Does Patient Have a Medical Advance Directive? No No No No No No No  Would patient like information on creating a medical advance directive? Yes (MAU/Ambulatory/Procedural Areas - Information given) No - Patient declined Yes (MAU/Ambulatory/Procedural Areas - Information given) No - Patient declined No - Patient declined No - Patient declined No - Patient declined    Current Medications (verified) Outpatient Encounter Medications as of 12/13/2022  Medication Sig   ACCU-CHEK GUIDE test strip CHECKING FASTING ONCE DAILY   amLODipine (NORVASC) 5 MG tablet TAKE 1 TABLET BY MOUTH EVERY DAY   atorvastatin (LIPITOR) 40 MG tablet TAKE 1 TABLET BY MOUTH EVERY DAY   BD PEN NEEDLE NANO 2ND GEN 32G X 4 MM MISC USE  TO INJECT TWICE INSULIN TWICE DAILY   cetirizine (ZYRTEC) 10 MG tablet Take 10 mg by mouth every evening.   empagliflozin (JARDIANCE) 25 MG TABS tablet Take 1 tablet (25 mg total) by mouth daily.   EPINEPHrine (EPIPEN 2-PAK) 0.3 mg/0.3 mL IJ SOAJ injection INJECT 0.3MLS INTO THE MUSLE ONCE   finasteride (PROSCAR) 5 MG tablet Take 1 tablet (5 mg total) by mouth daily.   insulin glargine (LANTUS SOLOSTAR) 100 UNIT/ML Solostar Pen Inject 40 Units into the skin daily.   KLOR-CON M10 10 MEQ tablet TAKE 2 TABLETS BY MOUTH EVERY DAY   liraglutide (VICTOZA) 18 MG/3ML SOPN INJECT 1.8 MG UNDER THE SKIN ONCE DAILY   liraglutide (VICTOZA) 18 MG/3ML SOPN Inject 1.8 mg into the skin daily.   metoprolol tartrate (LOPRESSOR) 25 MG tablet TAKE 3 TABS BY MOUTH 2 (TWO) TIMES DAILY AT 10 AM AND 5 PM.   omeprazole (PRILOSEC) 20 MG capsule Take 1 capsule (20 mg total) by mouth daily as needed.   tamsulosin (FLOMAX) 0.4 MG CAPS capsule TAKE 2 CAPSULES BY MOUTH EVERY DAY   VITAMIN D PO Take 1,000 Units by mouth as needed.   No facility-administered encounter medications on file as of 12/13/2022.    Allergies (verified) Ace inhibitors and Calcium carbonate antacid   History: Past Medical History:  Diagnosis Date   Abdominal ultrasound, abnormal 02/2004   fatty liver   Anemia    Angioedema 07/13/2014   Arthritis    bilateral thumbs  Atypical chest pain 12/14/2013   Cataract    removed on both eyes   Cellulitis and abscess 07/14/2015   Diabetes mellitus without complication (HCC)    DIASTASIS RECTI 12/08/2007   Qualifier: Diagnosis of  By: Sheffield Slider MD, [facility]  Present, but causing no problems    Elbow dislocation 1610,9604   Right   Encounter for diagnostic endoscopy 10/22/04   negative   History of esophagogastroduodenoscopy 08/13/2007   normal   Hypertension    controlled with medication    Treadmill stress test negative for angina pectoris 06/2002   poor HR and BP recovery   ULNAR NERVE ENTRAPMENT, RIGHT  06/07/2008   Qualifier: Diagnosis of  By: Sheffield Slider MD, Deniece Portela     Past Surgical History:  Procedure Laterality Date   ANTERIOR CRUCIATE LIGAMENT REPAIR  5/98   Left, staph infection complicated   BASAL CELL CARCINOMA EXCISION  02/2005   R ear   BLEPHAROPLASTY  03/2005   with ptosis repair   cataract surgery Left 2018   INGUINAL HERNIA REPAIR Right 1947   INGUINAL HERNIA REPAIR Left 1970   Family History  Problem Relation Age of Onset   Alzheimer's disease Mother        died age 61   Hypertension Mother    Aortic aneurysm Father        died age 67   Anuerysm Father    Hypertension Father    Aortic aneurysm Brother        repaired plus AI graft   Diabetes Brother    Heart disease Brother    Stroke Brother    Social History   Socioeconomic History   Marital status: Married    Spouse name: [name]   Number of children: 2   Years of education: 16   Highest education level: Master's degree (e.g., MA, MS, MEng, MEd, MSW, MBA)  Occupational History   Occupation: MEDIA SPECIALIST    Employer: RETIRED  Tobacco Use   Smoking status: Former    Current packs/day: 0.00    Average packs/day: 1 pack/day for 25.0 years (25.0 ttl pk-yrs)    Types: Cigarettes    Start date: 03[birth year]-1942    Quit date: 06/10/1967    Years since quitting: 55.5    Passive exposure: Past   Smokeless tobacco: Never  Vaping Use   Vaping status: Never Used  Substance and Sexual Activity   Alcohol use: Yes    Alcohol/week: 1.0 standard drink of alcohol    Types: 1 Standard drinks or equivalent per week    Comment: occasional   Drug use: No   Sexual activity: Yes    Partners: Female  Other Topics Concern   Not on file  Social History Narrative   Daughter, Tanner Mason in [facility]   Daugher, Tanner Mason, in [place]   5 Grandchildren total.       He retired from teaching in 2010       Emergency Contact: wife [name]   Who lives with you: wife   Any pets: none   Diet: Patient has a varied diet of  vegetables, protein, and starch.  Pt reports enjoying his sweets and carbohydrates.   Exercise: Patient exercises 4x or more a week. Patient and wife enjoy walking on the [place]. Due to his balance he is no longer biking.    Pt and his wife enjoy their "communites" yoga class they attend 2x weekly. [facility]    Seatbelts:  Pt reports wearing seatbelt when in vehicle. Pt wears a helmet while riding his bike.   Wynelle Link Exposure/Protection: Pt reports wearing sun screen/hat and is under care of dermatologist.   Hobbies: [facility] house and [facility]. Patient and wife have lunch and/or dinners with couples at least 2x per week.    One story home.    Right handed.         Social Determinants of Health   Financial Resource Strain: Low Risk  (12/13/2022)   Overall Financial Resource Strain (CARDIA)    Difficulty of Paying Living Expenses: Not hard at all  Food Insecurity: No Food Insecurity (12/13/2022)   Hunger Vital Sign    Worried About Running Out of Food in the Last Year: Never true    Ran Out of Food in the Last Year: Never true  Transportation Needs: No Transportation Needs (12/13/2022)   PRAPARE - Administrator, Civil Service (Medical): No    Lack of Transportation (Non-Medical): No  Physical Activity: Sufficiently Active (12/13/2022)   Exercise Vital Sign    Days of Exercise per Week: 5 days    Minutes of Exercise per Session: 60 min  Stress: No Stress Concern Present (12/13/2022)   [employer] of Occupational Health - Occupational Stress Questionnaire    Feeling of Stress : Not at all  Social Connections: Moderately Isolated (12/13/2022)   Social Connection and Isolation Panel [NHANES]    Frequency of Communication with Friends and Family: More than three times a week    Frequency of Social Gatherings with Friends and Family: Three times a week    Attends Religious Services: Never    Active Member of Clubs or Organizations:  No    Attends Engineer, structural: Never    Marital Status: Married    Tobacco Counseling Counseling given: Not Answered   Clinical Intake:  Pre-visit preparation completed: Yes  Pain : No/denies pain     Diabetes: Yes CBG done?: No Did pt. bring in CBG monitor from home?: No  How often do you need to have someone help you when you read instructions, pamphlets, or other written materials from your doctor or pharmacy?: 1 - Never  Interpreter Needed?: No  Information entered by :: Kandis Fantasia LPN   Activities of Daily Living    12/13/2022    2:09 PM 03/27/2022    9:16 AM  In your present state of health, do you have any difficulty performing the following activities:  Hearing? 0 0  Vision? 0 0  Difficulty concentrating or making decisions? 0 0  Walking or climbing stairs? 0 0  Dressing or bathing? 0 0  Doing errands, shopping? 0 0  Preparing Food and eating ? N N  Using the Toilet? N N  In the past six months, have you accidently leaked urine? N N  Do you have problems with loss of bowel control? N N  Managing your Medications? N N  Managing your Finances? N N  Housekeeping or managing your Housekeeping? N N    Patient Care Team: Carney Living, MD as PCP - General (Family Medicine) Glendale Chard, DO as Consulting Physician (Neurology) Janet Berlin, MD as Consulting Physician (Ophthalmology) Estanislado Emms, MD as Consulting Physician (Nephrology) [place], Elvin So, MD as Referring Physician (Dermatology)  Indicate any recent Medical Services you may have received from other than [facility] providers in the past year (date may be approximate).     Assessment:   This is  a routine wellness examination for [place].  Hearing/Vision screen Hearing Screening - Comments:: Denies hearing difficulties   Vision Screening - Comments:: Wears rx glasses - up to date with routine eye exams with Dr. Burgess Estelle    Dietary issues and exercise  activities discussed:     Goals Addressed             This Visit's Progress    Remain active and independent        Depression Screen    12/13/2022    2:11 PM 10/02/2022    9:53 AM 07/23/2022   10:12 AM 04/24/2022    9:14 AM 03/27/2022    9:14 AM 02/25/2022    9:39 AM 07/02/2021    9:57 AM  PHQ 2/9 Scores  PHQ - 2 Score 0 0 0 0 0 0 0  PHQ- 9 Score  0 0 0   0    Fall Risk    12/13/2022    2:12 PM 10/02/2022    9:52 AM 07/23/2022   10:13 AM 04/24/2022    9:12 AM 03/27/2022    9:15 AM  Fall Risk   Falls in the past year? 0 0 0 0 1  Number falls in past yr: 0 0 0 0 1  Injury with Fall? 0 0 0 0 0  Risk for fall due to : No Fall Risks  No Fall Risks  Impaired balance/gait  Follow up Falls prevention discussed;Education provided;Falls evaluation completed  Falls prevention discussed  Falls prevention discussed    MEDICARE RISK AT HOME:  Medicare Risk at Home - 12/13/22 1412     Any stairs in or around the home? Yes    If so, are there any without handrails? No    Home free of loose throw rugs in walkways, pet beds, electrical cords, etc? Yes    Adequate lighting in your home to reduce risk of falls? Yes    Life alert? No    Use of a cane, walker or w/c? No    Grab bars in the bathroom? Yes    Shower chair or bench in shower? No    Elevated toilet seat or a handicapped toilet? Yes             TIMED UP AND GO:  Was the test performed?  No    Cognitive Function:    06/28/2013    3:00 PM 01/21/2012    4:00 PM 11/29/2010   11:00 AM  MMSE - Mini Mental State Exam  Orientation to time 5 5 5   Orientation to Place 5 5 5   Registration 3 3 3   Attention/ Calculation 5 5 5   Recall 3 3 3   Language- name 2 objects 2 2 2   Language- repeat 1 1 1   Language- follow 3 step command 3 3 3   Language- read & follow direction 1 1 1   Write a sentence 1 1 1   Copy design 1 1 1   Total score 30 30 30         12/13/2022    2:12 PM 03/27/2022    9:19 AM 09/23/2018    2:29 PM  6CIT  Screen  What Year? 0 points 0 points 0 points  What month? 0 points 0 points 0 points  What time? 0 points 0 points 0 points  Count back from 20 0 points 0 points 0 points  Months in reverse 0 points 0 points 0 points  Repeat phrase 0 points 0 points 0 points  Total  Score 0 points 0 points 0 points    Immunizations Immunization History  Administered Date(s) Administered   COVID-19, mRNA, vaccine(Comirnaty)12 years and older 03/05/2022   Influenza Split 01/21/2012   Influenza Whole 12/28/2005, 01/27/2008, 01/31/2009, 03/06/2010   Influenza, High Dose Seasonal PF 01/17/2015   Influenza-Unspecified 12/28/2012, 01/31/2014   PFIZER Comirnaty(Gray Top)Covid-19 Tri-Sucrose Vaccine 08/31/2020   PFIZER(Purple Top)SARS-COV-2 Vaccination 06/05/2019, 06/26/2019, 01/25/2020   Pfizer Covid-19 Vaccine Bivalent Booster 67yrs & up 01/16/2021   Pneumococcal Conjugate-13 11/08/2014   Pneumococcal Polysaccharide-23 02/28/1996, 03/23/1996, 04/04/2009   Td 03/29/2004   Tdap 11/08/2014   Unspecified SARS-COV-2 Vaccination 03/05/2022   Zoster Recombinant(Shingrix) 01/16/2021   Zoster, Live 12/06/2009    TDAP status: Up to date  Flu Vaccine status: Up to date  Pneumococcal vaccine status: Up to date  Covid-19 vaccine status: Information provided on how to obtain vaccines.   Qualifies for Shingles Vaccine? Yes   Zostavax completed Yes   Shingrix Completed?: No.    Education has been provided regarding the importance of this vaccine. Patient has been advised to call insurance company to determine out of pocket expense if they have not yet received this vaccine. Advised may also receive vaccine at local pharmacy or Health Dept. Verbalized acceptance and understanding.  Screening Tests Health Maintenance  Topic Date Due   Diabetic kidney evaluation - Urine ACR  07/04/2015   FOOT EXAM  07/02/2018   COVID-19 Vaccine (6 - 2023-24 season) 07/04/2022   Diabetic kidney evaluation - eGFR measurement   01/09/2023   Colonoscopy  01/23/2023   LIPID PANEL  01/09/2023   HEMOGLOBIN A1C  04/03/2023   OPHTHALMOLOGY EXAM  05/02/2023   Medicare Annual Wellness (AWV)  12/13/2023   DTaP/Tdap/Td (3 - Td or Tdap) 11/07/2024   Pneumonia Vaccine 31+ Years old  Completed   HPV VACCINES  Aged Out   Hepatitis C Screening  Discontinued   Zoster Vaccines- Shingrix  Discontinued    Health Maintenance  Health Maintenance Due  Topic Date Due   Diabetic kidney evaluation - Urine ACR  07/04/2015   FOOT EXAM  07/02/2018   COVID-19 Vaccine (6 - 2023-24 season) 07/04/2022   Diabetic kidney evaluation - eGFR measurement  01/09/2023   Colonoscopy  01/23/2023    Colorectal cancer screening: No longer required.   Lung Cancer Screening: (Low Dose CT Chest recommended if Age 6-80 years, 20 pack-year currently smoking OR have quit w/in 15years.) does not qualify.   Lung Cancer Screening Referral: n/a  Additional Screening:  Hepatitis C Screening: does not qualify  Vision Screening: Recommended annual ophthalmology exams for early detection of glaucoma and other disorders of the eye. Is the patient up to date with their annual eye exam?  Yes  Who is the provider or what is the name of the office in which the patient attends annual eye exams? Dr. Burgess Estelle  If pt is not established with a provider, would they like to be referred to a provider to establish care? No .   Dental Screening: Recommended annual dental exams for proper oral hygiene  Diabetic Foot Exam: Diabetic Foot Exam: Overdue, Pt has been advised about the importance in completing this exam. Pt is scheduled for diabetic foot exam on at next office visit.  Community Resource Referral / Chronic Care Management: CRR required this visit?   No  CCM required this visit?  No     Plan:     I have personally reviewed and noted the following in the patient's chart:  Medical and social history Use of alcohol, tobacco or illicit drugs  Current  medications and supplements including opioid prescriptions. Patient is not currently taking opioid prescriptions. Functional ability and status Nutritional status Physical activity Advanced directives List of other physicians Hospitalizations, surgeries, and ER visits in previous 12 months Vitals Screenings to include cognitive, depression, and falls Referrals and appointments  In addition, I have reviewed and discussed with patient certain preventive protocols, quality metrics, and best practice recommendations. A written personalized care plan for preventive services as well as general preventive health recommendations were provided to patient.     Kandis Fantasia [place], [place]   1/61/0960   After Visit Summary: (MyChart) Due to this being a telephonic visit, the after visit summary with patients personalized plan was offered to patient via MyChart   Nurse Notes: No concerns at this time

## 2022-12-19 ENCOUNTER — Other Ambulatory Visit (HOSPITAL_BASED_OUTPATIENT_CLINIC_OR_DEPARTMENT_OTHER): Payer: Self-pay

## 2023-01-01 ENCOUNTER — Other Ambulatory Visit (HOSPITAL_BASED_OUTPATIENT_CLINIC_OR_DEPARTMENT_OTHER): Payer: Self-pay

## 2023-01-01 ENCOUNTER — Other Ambulatory Visit: Payer: Self-pay | Admitting: Family Medicine

## 2023-01-02 ENCOUNTER — Other Ambulatory Visit (HOSPITAL_BASED_OUTPATIENT_CLINIC_OR_DEPARTMENT_OTHER): Payer: Self-pay

## 2023-01-02 MED ORDER — LIRAGLUTIDE 18 MG/3ML ~~LOC~~ SOPN
1.8000 mg | PEN_INJECTOR | Freq: Every day | SUBCUTANEOUS | 1 refills | Status: DC
  Filled 2023-01-02 – 2023-01-08 (×2): qty 9, 30d supply, fill #0
  Filled 2023-02-05 (×2): qty 9, 30d supply, fill #1

## 2023-01-03 ENCOUNTER — Other Ambulatory Visit (HOSPITAL_BASED_OUTPATIENT_CLINIC_OR_DEPARTMENT_OTHER): Payer: Self-pay

## 2023-01-08 ENCOUNTER — Other Ambulatory Visit: Payer: Self-pay

## 2023-01-08 ENCOUNTER — Other Ambulatory Visit (HOSPITAL_BASED_OUTPATIENT_CLINIC_OR_DEPARTMENT_OTHER): Payer: Self-pay

## 2023-01-11 ENCOUNTER — Other Ambulatory Visit: Payer: Self-pay | Admitting: Family Medicine

## 2023-01-14 ENCOUNTER — Other Ambulatory Visit (HOSPITAL_BASED_OUTPATIENT_CLINIC_OR_DEPARTMENT_OTHER): Payer: Self-pay

## 2023-01-17 ENCOUNTER — Other Ambulatory Visit (HOSPITAL_BASED_OUTPATIENT_CLINIC_OR_DEPARTMENT_OTHER): Payer: Self-pay

## 2023-01-17 MED FILL — Atorvastatin Calcium Tab 40 MG (Base Equivalent): ORAL | 90 days supply | Qty: 90 | Fill #0 | Status: AC

## 2023-01-20 ENCOUNTER — Other Ambulatory Visit (HOSPITAL_BASED_OUTPATIENT_CLINIC_OR_DEPARTMENT_OTHER): Payer: Self-pay

## 2023-01-20 ENCOUNTER — Other Ambulatory Visit: Payer: Self-pay | Admitting: Family Medicine

## 2023-01-20 ENCOUNTER — Other Ambulatory Visit: Payer: Self-pay

## 2023-01-20 DIAGNOSIS — K219 Gastro-esophageal reflux disease without esophagitis: Secondary | ICD-10-CM

## 2023-01-20 MED ORDER — OMEPRAZOLE 20 MG PO CPDR
20.0000 mg | DELAYED_RELEASE_CAPSULE | Freq: Every day | ORAL | 2 refills | Status: DC | PRN
  Filled 2023-01-20: qty 90, 90d supply, fill #0
  Filled 2023-06-30: qty 90, 90d supply, fill #1

## 2023-01-20 MED FILL — Insulin Pen Needle 32 G X 4 MM (1/6" or 5/32"): 50 days supply | Qty: 100 | Fill #0 | Status: AC

## 2023-01-21 ENCOUNTER — Other Ambulatory Visit (HOSPITAL_BASED_OUTPATIENT_CLINIC_OR_DEPARTMENT_OTHER): Payer: Self-pay

## 2023-01-22 ENCOUNTER — Other Ambulatory Visit: Payer: Self-pay

## 2023-01-22 ENCOUNTER — Ambulatory Visit: Payer: Medicare PPO | Admitting: Family Medicine

## 2023-01-22 VITALS — BP 118/70 | HR 77 | Wt 146.8 lb

## 2023-01-22 DIAGNOSIS — I10 Essential (primary) hypertension: Secondary | ICD-10-CM | POA: Diagnosis not present

## 2023-01-22 DIAGNOSIS — E1140 Type 2 diabetes mellitus with diabetic neuropathy, unspecified: Secondary | ICD-10-CM

## 2023-01-22 DIAGNOSIS — Z794 Long term (current) use of insulin: Secondary | ICD-10-CM

## 2023-01-22 LAB — POCT GLYCOSYLATED HEMOGLOBIN (HGB A1C): HbA1c, POC (controlled diabetic range): 7.6 % — AB (ref 0.0–7.0)

## 2023-01-22 NOTE — Progress Notes (Signed)
    SUBJECTIVE:   CHIEF COMPLAINT / HPI:   Type 2 diabetes mellitus with diabetic neuropathy, unspecified whether long term insulin use (HCC) Brings in all his medications.  No low blood sugars.  No weight change   Essential hypertension, benign Checks his blood pressure infrequently.  No high readings.  No edema   Imbalance No falls.  Exercises most days at home.  Feels it is not worse or better No calf or leg pain with walking   Had blood work by his nephrologis in July - all ok   OBJECTIVE:   BP 118/70   Pulse 77   Wt 146 lb 12.8 oz (66.6 kg)   SpO2 100%   BMI 22.99 kg/m   Feet - warm without palpable pulses or hair. No lesions or edema Heart - Regular rate and rhythm.  No murmurs, gallops or rubs.    Lungs:  Normal respiratory effort, chest expands symmetrically. Lungs are clear to auscultation, no crackles or wheezes.   ASSESSMENT/PLAN:   Type 2 diabetes mellitus with diabetic neuropathy, unspecified whether long term insulin use (HCC) Assessment & Plan: A1c creeping up but within goal.  Will recheck in 3 months. Continue call current medications   Orders: -     POCT glycosylated hemoglobin (Hb A1C)  Essential hypertension, benign Assessment & Plan: At goal continue current medications Check blood tests in 3 months       Patient Instructions  Good to see you today - Thank you for coming in  Things we discussed today:  Keep exercising  Please always bring your medication bottles  Come back to see me in in December for a diabetes and blood test    Carney Living, MD [facility]

## 2023-01-22 NOTE — Assessment & Plan Note (Signed)
A1c creeping up but within goal.  Will recheck in 3 months. Continue call current medications

## 2023-01-22 NOTE — Patient Instructions (Signed)
Good to see you today - Thank you for coming in  Things we discussed today:  Keep exercising  Please always bring your medication bottles  Come back to see me in in December for a diabetes and blood test

## 2023-01-22 NOTE — Assessment & Plan Note (Signed)
At goal continue current medications Check blood tests in 3 months

## 2023-01-28 MED FILL — Amlodipine Besylate Tab 5 MG (Base Equivalent): ORAL | 90 days supply | Qty: 90 | Fill #0 | Status: AC

## 2023-01-29 ENCOUNTER — Other Ambulatory Visit (HOSPITAL_BASED_OUTPATIENT_CLINIC_OR_DEPARTMENT_OTHER): Payer: Self-pay

## 2023-02-05 ENCOUNTER — Other Ambulatory Visit (HOSPITAL_BASED_OUTPATIENT_CLINIC_OR_DEPARTMENT_OTHER): Payer: Self-pay

## 2023-02-06 ENCOUNTER — Other Ambulatory Visit (HOSPITAL_BASED_OUTPATIENT_CLINIC_OR_DEPARTMENT_OTHER): Payer: Self-pay

## 2023-02-06 ENCOUNTER — Other Ambulatory Visit: Payer: Self-pay

## 2023-02-27 ENCOUNTER — Other Ambulatory Visit (HOSPITAL_BASED_OUTPATIENT_CLINIC_OR_DEPARTMENT_OTHER): Payer: Self-pay

## 2023-02-27 MED FILL — Metoprolol Tartrate Tab 25 MG: ORAL | 90 days supply | Qty: 540 | Fill #0 | Status: AC

## 2023-03-05 ENCOUNTER — Other Ambulatory Visit: Payer: Self-pay | Admitting: Family Medicine

## 2023-03-07 ENCOUNTER — Other Ambulatory Visit: Payer: Self-pay

## 2023-03-07 ENCOUNTER — Other Ambulatory Visit (HOSPITAL_BASED_OUTPATIENT_CLINIC_OR_DEPARTMENT_OTHER): Payer: Self-pay

## 2023-03-07 ENCOUNTER — Other Ambulatory Visit (HOSPITAL_COMMUNITY): Payer: Self-pay

## 2023-03-07 ENCOUNTER — Other Ambulatory Visit: Payer: Self-pay | Admitting: Family Medicine

## 2023-03-07 MED ORDER — TAMSULOSIN HCL 0.4 MG PO CAPS
0.8000 mg | ORAL_CAPSULE | Freq: Every day | ORAL | 2 refills | Status: DC
  Filled 2023-03-07: qty 180, 90d supply, fill #0

## 2023-03-07 MED ORDER — LIRAGLUTIDE 18 MG/3ML ~~LOC~~ SOPN
1.8000 mg | PEN_INJECTOR | Freq: Every day | SUBCUTANEOUS | 2 refills | Status: DC
  Filled 2023-03-07 – 2023-03-11 (×2): qty 9, 30d supply, fill #0
  Filled 2023-04-07: qty 9, 30d supply, fill #1
  Filled 2023-04-08: qty 9, 30d supply, fill #0
  Filled 2023-04-15: qty 9, 30d supply, fill #1

## 2023-03-07 MED FILL — Insulin Pen Needle 32 G X 4 MM (1/6" or 5/32"): 50 days supply | Qty: 100 | Fill #1 | Status: AC

## 2023-03-10 ENCOUNTER — Other Ambulatory Visit (HOSPITAL_BASED_OUTPATIENT_CLINIC_OR_DEPARTMENT_OTHER): Payer: Self-pay

## 2023-03-11 ENCOUNTER — Other Ambulatory Visit (HOSPITAL_BASED_OUTPATIENT_CLINIC_OR_DEPARTMENT_OTHER): Payer: Self-pay

## 2023-03-12 ENCOUNTER — Other Ambulatory Visit (HOSPITAL_BASED_OUTPATIENT_CLINIC_OR_DEPARTMENT_OTHER): Payer: Self-pay

## 2023-03-16 MED FILL — Potassium Chloride Microencapsulated Crys ER Tab 10 mEq: ORAL | 90 days supply | Qty: 180 | Fill #0 | Status: AC

## 2023-03-17 ENCOUNTER — Other Ambulatory Visit (HOSPITAL_BASED_OUTPATIENT_CLINIC_OR_DEPARTMENT_OTHER): Payer: Self-pay

## 2023-03-19 ENCOUNTER — Encounter: Payer: Self-pay | Admitting: Family Medicine

## 2023-03-20 ENCOUNTER — Other Ambulatory Visit (HOSPITAL_BASED_OUTPATIENT_CLINIC_OR_DEPARTMENT_OTHER): Payer: Self-pay

## 2023-03-20 DIAGNOSIS — L03312 Cellulitis of back [any part except buttock]: Secondary | ICD-10-CM | POA: Diagnosis not present

## 2023-03-20 MED ORDER — CEPHALEXIN 500 MG PO CAPS
500.0000 mg | ORAL_CAPSULE | Freq: Three times a day (TID) | ORAL | 0 refills | Status: DC
  Filled 2023-03-20: qty 21, 7d supply, fill #0

## 2023-03-24 ENCOUNTER — Other Ambulatory Visit (HOSPITAL_BASED_OUTPATIENT_CLINIC_OR_DEPARTMENT_OTHER): Payer: Self-pay

## 2023-03-25 ENCOUNTER — Encounter: Payer: Self-pay | Admitting: Student

## 2023-03-25 ENCOUNTER — Ambulatory Visit: Payer: Medicare PPO | Admitting: Student

## 2023-03-25 VITALS — BP 126/77 | HR 82 | Ht 67.0 in | Wt 147.6 lb

## 2023-03-25 DIAGNOSIS — L720 Epidermal cyst: Secondary | ICD-10-CM | POA: Diagnosis not present

## 2023-03-25 NOTE — Progress Notes (Signed)
  SUBJECTIVE:   CHIEF COMPLAINT / HPI:   [facility] f/u -11/21 for cyst that was draining, later became cellulitis, Tx Keflex   Today: Has had a knot on back, for years, that would drain from time to time when he squeezed. A week ago, it got red and swollen, he went to UC and got abx for Tx. Is now doing better and does not bother him.     PERTINENT  PMH / PSH:   OBJECTIVE:  There were no vitals taken for this visit. Physical Exam Skin:    General: Skin is warm.     Findings: Erythema and lesion present.     Comments: Indurated 2 cm lesion w/ erythema and white cap      ASSESSMENT/PLAN:   Assessment & Plan Epidermal cyst Patient comes in for a cyst that's been on his lower back for years, draining sporadically at times.  Patient reports cyst recently was draining, became infected, went to urgent care was treated with Keflex.  Cellulitis now improved, and says no longer bothering patient.  Cyst is indurated, with erythema, does not appear infected.  Through shared decision making patient wants to continue to monitor cyst, and will decide at later time if he would like excision. - Follow-up as needed for excision No follow-ups on file. Bess Kinds, MD 03/25/2023, 12:49 PM PGY-3, [facility]

## 2023-03-25 NOTE — Patient Instructions (Signed)
It was great to see you! Thank you for allowing me to participate in your care!  I recommend that you always bring your medications to each appointment as this makes it easy to ensure we are on the correct medications and helps [place] not miss when refills are needed.  Our plans for today:  - Cyst This is a benign cyst that will likely continue to resolve on it's own. It no longer appears infected. This can recur. If you would like this removed, we will get you scheduled for an appointment in our Derm Procedures Clinic.    Take care and seek immediate care sooner if you develop any concerns.   Dr. Bess Kinds, MD [facility]

## 2023-03-25 NOTE — Assessment & Plan Note (Addendum)
Patient comes in for a cyst that's been on his lower back for years, draining sporadically at times.  Patient reports cyst recently was draining, became infected, went to urgent care was treated with Keflex.  Cellulitis now improved, and says no longer bothering patient.  Cyst is indurated, with erythema, does not appear infected.  Through shared decision making patient wants to continue to monitor cyst, and will decide at later time if he would like excision. - Follow-up as needed for excision

## 2023-04-03 ENCOUNTER — Other Ambulatory Visit (HOSPITAL_BASED_OUTPATIENT_CLINIC_OR_DEPARTMENT_OTHER): Payer: Self-pay

## 2023-04-03 ENCOUNTER — Ambulatory Visit: Payer: Medicare PPO

## 2023-04-03 VITALS — BP 100/60 | HR 90 | Temp 98.0°F | Wt 148.4 lb

## 2023-04-03 DIAGNOSIS — L0291 Cutaneous abscess, unspecified: Secondary | ICD-10-CM

## 2023-04-03 MED ORDER — DOXYCYCLINE HYCLATE 100 MG PO TABS
100.0000 mg | ORAL_TABLET | Freq: Two times a day (BID) | ORAL | 0 refills | Status: DC
Start: 2023-04-03 — End: 2023-04-03

## 2023-04-03 MED ORDER — DOXYCYCLINE HYCLATE 100 MG PO TABS
100.0000 mg | ORAL_TABLET | Freq: Two times a day (BID) | ORAL | 0 refills | Status: AC
  Filled 2023-04-03: qty 20, 10d supply, fill #0

## 2023-04-03 NOTE — Progress Notes (Addendum)
    SUBJECTIVE:   CHIEF COMPLAINT / HPI:   Epidermal cyst  Patient has had a "knot" on his lower back for years that would occasionally drain. On 11/21 he was seen in urgent care as the area had become painful, erythematous. He was treated with antibiotics at that time with improvement. On follow up on 11/26 cyst was still improving though continued to be indurated. Patient wanted to monitor for a while and decide if he wanted excision.  Today, patient reports that he feels like the previous course of antibiotics has not necessarily helped, but he does not feel that his infection is worse. It has continued to drain every day and he has to keep replacing the dressing.   PERTINENT  PMH / PSH: HTN, T2DM, CKD3, Hypercholesterolemia  OBJECTIVE:   BP 100/60   Pulse 90   Temp 98 F (36.7 C)   Wt 148 lb 6.4 oz (67.3 kg)   SpO2 96%   BMI 23.24 kg/m   General: well appearing, in no acute distress CV: RRR Resp: Normal work of breathing on room air Skin, extensive seborrheic keratosis dispersed diffusely across the back, lower right lower back 2 inch erythematous raised indurated lesion with surrounding induration, multiple pores draining pus in the center.    ASSESSMENT/PLAN:   Assessment & Plan Abscess Patient has progression of his infected epidermoid cyst. Most likely previous abx were not effective due to inadequate source control. Additionally did not cover for MRSA and patient has had development of purulent drainage since the initial presentation. Most likely wiill benefit from incision and drainage and clearing of inner septations.  Diagnosis: abscess - Location: infected epidermal cyst in right lower back  Procedure: Incision & drainage Informed consent:  Discussed risks (permanent loss of nail, permanent irregular growth of nail, infection, pain, bleeding, bruising, numbness, and recurrence of the condition) and benefits of the procedure, as well as the alternatives.  Informed  consent was obtained. Anesthesia: lidocaine with epinephrine  The area was prepared and draped in a standard fashion. The lesion drained pus, white, chalky, cyst material, clear, mucoid fluid, and blood. The lesion was multiloculated. Multiple cavities were opened and drained. Antibiotic ointment and a sterile pressure dressing were applied. The patient tolerated the procedure well. The patient was instructed on post-op care. Patient has follow up with PCP Dr. Deirdre Priest on Dec 11th and further follow up for excision of cyst on Dec 19th  - Doxycycline BID for 10 days       Lockie Mola, MD [facility]

## 2023-04-03 NOTE — Addendum Note (Signed)
Addended by: Janit Pagan T on: 04/03/2023 12:49 PM   Modules accepted: Level of Service

## 2023-04-03 NOTE — Patient Instructions (Addendum)
It was wonderful to see you today.  Please bring ALL of your medications with you to every visit.   Today completed an incision and drainage to drain an infection. This was caused by an infected cyst. We have given you additional days of a different antibiotic. If you have worsening pain, redness, or size of the cyst please call the clinic.   Thank you for choosing [facility].   Please call [phone] with any questions about today's appointment.  Lockie Mola, MD  Family Medicine

## 2023-04-08 ENCOUNTER — Other Ambulatory Visit (HOSPITAL_BASED_OUTPATIENT_CLINIC_OR_DEPARTMENT_OTHER): Payer: Self-pay

## 2023-04-08 NOTE — Patient Instructions (Incomplete)
Good to see you today - Thank you for coming in  Things we discussed today:  Diabetes  Start ozempic the second day after you stop victoza.   Start Ozempic at 0.25 mg once weekly for the first 4 weeks. This initial dose is intended for treatment initiation and is not effective for glycemic control. After 4 weeks, increase the dose to 0.5 mg once weekly. Keep this dose until you come for your next A1c   Labs I will call you if your tests are not good.  Otherwise, I will send you a message on MyChart (if it is active) or a letter in the mail..  If you do not hear from me with in 2 weeks please call our office.      I will miss working with you.   I trust you will continue to do well  Nedra Hai

## 2023-04-08 NOTE — Progress Notes (Unsigned)
    SUBJECTIVE:   CHIEF COMPLAINT / HPI:   Diabetes He is unsure why his A1c is up.  His fasting blood sugar have been higher than usual of last month or so.  He does not exercise regularly any more  Back Cyst This is improving.  He is still taking doxycyline.  Has small amount of purulent discharge but less pain and feels is getting smaller  Hypertension Brings in his medications.  Is taking regularly  Gait Neuropathy  No falls.  Is not doing balance exercises any longer.  SH - he lives near [facility] and may want to find a new PCP there.  Hopes to get his wife to exercise with him  OBJECTIVE:   BP 117/71   Pulse 85   Ht 5\' 7"  (1.702 m)   Wt 151 lb 3.2 oz (68.6 kg)   SpO2 98%   BMI 23.68 kg/m   Heart - Regular rate and rhythm.  No murmurs, gallops or rubs.    Lungs:  Normal respiratory effort, chest expands symmetrically. Lungs are clear to auscultation, no crackles or wheezes. Slightly slow gait without obvious instability   ASSESSMENT/PLAN:   Essential hypertension, benign Assessment & Plan: At goal.  Continue current medications.  Has labs drawn by his nephrologist.   Thrombocytopenia (HCC) Assessment & Plan: CBC is followed by his nephrologist   Diabetes mellitus due to underlying condition with diabetic nephropathy, with long-term current use of insulin (HCC) -     POCT glycosylated hemoglobin (Hb A1C)  Type 2 diabetes mellitus with diabetic neuropathy, unspecified whether long term insulin use (HCC) Assessment & Plan: Worsened. Perhaps due to lack of exercise and diet or recent abscess.   His insurance is switching from victoza to ozempic.  I will send in new Rx in January.  In mean time will titrate insulin to get FBS < 150.  Recheck in 3 months    Imbalance Assessment & Plan: Likely a combination of neuropathy residual Guillian Barre.   Recommend regular exercise and balance strengthening.  He would like to do on his own and not go to Physical Therapy right  now.     Epidermal cyst Assessment & Plan: Improving.  Has follow up in derm clinic in one week.  Will need to decide to excise vs watchful waiting    Benign prostatic hyperplasia with lower urinary tract symptoms, symptom details unspecified Assessment & Plan: Stable symptoms and no signs of hydronephrosis.  Continue current medications       Patient Instructions  Good to see you today - Thank you for coming in  Things we discussed today:  Diabetes  Increase Insulin 1 unit every morning your blood sugar is > 150.  If you get to higher than 50 units let me know  I will send the prescription for Ozempic to the [facility] [address] [facility] in Jan.   Will start at a lower dose.  If you have side effects let us know  Strongly urge you to exercise and work on your balance very day - [facility]   I will recommend a PCP near you  I will miss working with you.   Be Well      Carney Living, MD [facility]

## 2023-04-09 ENCOUNTER — Encounter: Payer: Self-pay | Admitting: Family Medicine

## 2023-04-09 ENCOUNTER — Ambulatory Visit: Payer: Medicare PPO | Admitting: Family Medicine

## 2023-04-09 VITALS — BP 117/71 | HR 85 | Ht 67.0 in | Wt 151.2 lb

## 2023-04-09 DIAGNOSIS — Z794 Long term (current) use of insulin: Secondary | ICD-10-CM | POA: Diagnosis not present

## 2023-04-09 DIAGNOSIS — R2689 Other abnormalities of gait and mobility: Secondary | ICD-10-CM

## 2023-04-09 DIAGNOSIS — E1140 Type 2 diabetes mellitus with diabetic neuropathy, unspecified: Secondary | ICD-10-CM | POA: Diagnosis not present

## 2023-04-09 DIAGNOSIS — N401 Enlarged prostate with lower urinary tract symptoms: Secondary | ICD-10-CM | POA: Diagnosis not present

## 2023-04-09 DIAGNOSIS — D696 Thrombocytopenia, unspecified: Secondary | ICD-10-CM

## 2023-04-09 DIAGNOSIS — I10 Essential (primary) hypertension: Secondary | ICD-10-CM

## 2023-04-09 DIAGNOSIS — L720 Epidermal cyst: Secondary | ICD-10-CM

## 2023-04-09 LAB — POCT GLYCOSYLATED HEMOGLOBIN (HGB A1C): HbA1c, POC (controlled diabetic range): 8.6 % — AB (ref 0.0–7.0)

## 2023-04-09 NOTE — Assessment & Plan Note (Addendum)
At goal.  Continue current medications.  Has labs drawn by his nephrologist.

## 2023-04-09 NOTE — Assessment & Plan Note (Signed)
Improving.  Has follow up in derm clinic in one week.  Will need to decide to excise vs watchful waiting

## 2023-04-09 NOTE — Assessment & Plan Note (Signed)
Worsened. Perhaps due to lack of exercise and diet or recent abscess.   His insurance is switching from victoza to ozempic.  I will send in new Rx in January.  In mean time will titrate insulin to get FBS < 150.  Recheck in 3 months

## 2023-04-09 NOTE — Assessment & Plan Note (Signed)
Likely a combination of neuropathy residual Guillian Barre.   Recommend regular exercise and balance strengthening.  He would like to do on his own and not go to Physical Therapy right now.

## 2023-04-09 NOTE — Assessment & Plan Note (Signed)
CBC is followed by his nephrologist

## 2023-04-09 NOTE — Assessment & Plan Note (Signed)
Stable symptoms and no signs of hydronephrosis.  Continue current medications

## 2023-04-14 ENCOUNTER — Telehealth: Payer: Self-pay | Admitting: Family Medicine

## 2023-04-14 NOTE — Telephone Encounter (Signed)
Pt came in and asked to establish care based off a referral from him current PCP. Please advise if pt can be establish with Dr.Wendling.

## 2023-04-15 ENCOUNTER — Other Ambulatory Visit (HOSPITAL_BASED_OUTPATIENT_CLINIC_OR_DEPARTMENT_OTHER): Payer: Self-pay

## 2023-04-15 MED FILL — Insulin Glargine Soln Pen-Injector 100 Unit/ML: SUBCUTANEOUS | 75 days supply | Qty: 30 | Fill #0 | Status: AC

## 2023-04-16 ENCOUNTER — Other Ambulatory Visit (HOSPITAL_BASED_OUTPATIENT_CLINIC_OR_DEPARTMENT_OTHER): Payer: Self-pay

## 2023-04-16 MED FILL — Atorvastatin Calcium Tab 40 MG (Base Equivalent): ORAL | 90 days supply | Qty: 90 | Fill #1 | Status: AC

## 2023-04-17 ENCOUNTER — Ambulatory Visit: Payer: Medicare PPO

## 2023-04-17 VITALS — BP 118/70 | HR 92 | Temp 97.7°F | Wt 150.0 lb

## 2023-04-17 DIAGNOSIS — L720 Epidermal cyst: Secondary | ICD-10-CM

## 2023-04-17 NOTE — Telephone Encounter (Signed)
If Dr. Deirdre Priest recommends him, OK to schedule. Ty,

## 2023-04-17 NOTE — Patient Instructions (Signed)
We packed the area today with iodoform gauze to help prevent infection. You can pack this every 2 days with the supplies we have given you. Come back in 2 weeks to see how you are doing or sooner if needed.

## 2023-04-17 NOTE — Progress Notes (Signed)
    SUBJECTIVE:   CHIEF COMPLAINT / HPI:   Epidermal cyst follow-up Previously seen in Derm clinic 12/5 at which time the cyst was drained he was continued on antibiotics.  He was seen on 12/11 with Dr. Deirdre Priest with continued improvement though continued to be red and had mild pus.  He comes in today noting that he completed his antibiotics and that he is not really having much pain.  He feels the area has gone down.  PERTINENT  PMH / PSH: Hypertension, T2DM, CKD stage III  OBJECTIVE:   BP 118/70   Pulse 92   Temp 97.7 F (36.5 C)   Wt 150 lb (68 kg)   SpO2 97%   BMI 23.49 kg/m   General: Alert and oriented, in NAD Skin: Warm, dry, and intact; erythematous indurated lesion with central pore, surrounding granulation tissue, and scant purulence (when expressed) at right lower back HEENT: NCAT, EOM grossly normal, midline nasal septum Respiratory: Breathing and speaking comfortably on RA Extremities: Moves all extremities grossly equally Neurological: No gross focal deficit Psychiatric: Appropriate mood and affect     ASSESSMENT/PLAN:   Epidermal cyst Improving with evidence of granulation tissue.  Reassured by lack of systemic symptoms, decrease in size, and decrease in pain.  Do not feel he needs any more antibiotics at this time.  Packed the area with about 3 cm of iodoform gauze without incident.  Gave supplies to pack every 2 days at home.  Follow-up in 2 weeks to assess for further improvement or sooner if symptoms worsen.   Janeal Holmes, MD [facility]

## 2023-04-17 NOTE — Assessment & Plan Note (Signed)
Improving with evidence of granulation tissue.  Reassured by lack of systemic symptoms, decrease in size, and decrease in pain.  Do not feel he needs any more antibiotics at this time.  Packed the area with about 3 cm of iodoform gauze without incident.  Gave supplies to pack every 2 days at home.  Follow-up in 2 weeks to assess for further improvement or sooner if symptoms worsen.

## 2023-05-05 ENCOUNTER — Other Ambulatory Visit (HOSPITAL_BASED_OUTPATIENT_CLINIC_OR_DEPARTMENT_OTHER): Payer: Self-pay

## 2023-05-07 DIAGNOSIS — H26492 Other secondary cataract, left eye: Secondary | ICD-10-CM | POA: Diagnosis not present

## 2023-05-07 DIAGNOSIS — H52203 Unspecified astigmatism, bilateral: Secondary | ICD-10-CM | POA: Diagnosis not present

## 2023-05-07 DIAGNOSIS — H524 Presbyopia: Secondary | ICD-10-CM | POA: Diagnosis not present

## 2023-05-07 DIAGNOSIS — E119 Type 2 diabetes mellitus without complications: Secondary | ICD-10-CM | POA: Diagnosis not present

## 2023-05-07 DIAGNOSIS — H02403 Unspecified ptosis of bilateral eyelids: Secondary | ICD-10-CM | POA: Diagnosis not present

## 2023-05-07 LAB — HM DIABETES EYE EXAM

## 2023-05-08 ENCOUNTER — Telehealth: Payer: Self-pay | Admitting: Family Medicine

## 2023-05-08 ENCOUNTER — Encounter: Payer: Self-pay | Admitting: Family Medicine

## 2023-05-08 ENCOUNTER — Ambulatory Visit: Payer: Medicare PPO | Admitting: Family Medicine

## 2023-05-08 ENCOUNTER — Other Ambulatory Visit (HOSPITAL_BASED_OUTPATIENT_CLINIC_OR_DEPARTMENT_OTHER): Payer: Self-pay

## 2023-05-08 VITALS — BP 124/80 | HR 79 | Wt 152.0 lb

## 2023-05-08 DIAGNOSIS — L720 Epidermal cyst: Secondary | ICD-10-CM | POA: Diagnosis not present

## 2023-05-08 DIAGNOSIS — L98429 Non-pressure chronic ulcer of back with unspecified severity: Secondary | ICD-10-CM | POA: Diagnosis not present

## 2023-05-08 MED ORDER — SEMAGLUTIDE(0.25 OR 0.5MG/DOS) 2 MG/3ML ~~LOC~~ SOPN: 0.5000 mg | PEN_INJECTOR | SUBCUTANEOUS | 2 refills | Status: DC

## 2023-05-08 MED ORDER — OZEMPIC (0.25 OR 0.5 MG/DOSE) 2 MG/3ML ~~LOC~~ SOPN
0.5000 mg | PEN_INJECTOR | SUBCUTANEOUS | 2 refills | Status: DC
  Filled 2023-05-08: qty 3, 28d supply, fill #0
  Filled 2023-06-01: qty 3, 28d supply, fill #1
  Filled 2023-06-30: qty 3, 28d supply, fill #2

## 2023-05-08 NOTE — Progress Notes (Signed)
    SUBJECTIVE:   CHIEF COMPLAINT / HPI:   Tanner Mason is a 83yo M w/ recent epidermal cyst drainage 12/5 and s/p doxy that p/f f/u of cyst.  -Lesion with healing well at 12/19 visit. -Denies any pain, fevers, drainage -His wife helps him with wound care.  They wonder if he should continue packing the wound. -Patient is also switching to a new PCP but will not be meeting them until around March   OBJECTIVE:   BP 124/80   Pulse 79   Wt 152 lb (68.9 kg)   SpO2 99%   BMI 23.81 kg/m   General: Alert, pleasant man. NAD. HEENT: NCAT. MMM. Resp:  Normal WOB on RA.  Ext: Moves all ext spontaneously Skin: Healing lesion with granulation tissue and right sided lumbar back.  Some surrounding hyperpigmentation.  No erythema or palpable fluctuance.     ASSESSMENT/PLAN:   Assessment & Plan Epidermal cyst Continues to heal, no evidence of infection.  Suspect slower healing due to diabetes.  Advised to stop packing wound, and continue routine wound care.  Provided dressing change in clinic.  Advised to follow-up in 2 weeks to have wound reassessed   Twyla Nearing, MD [facility]

## 2023-05-08 NOTE — Assessment & Plan Note (Signed)
 Continues to heal, no evidence of infection.  Suspect slower healing due to diabetes.  Advised to stop packing wound, and continue routine wound care.  Provided dressing change in clinic.  Advised to follow-up in 2 weeks to have wound reassessed

## 2023-05-08 NOTE — Patient Instructions (Signed)
 Good to see you today - Thank you for coming in  Things we discussed today:  1) Your wound is continuing to heal.  It can take longer for patients with diabetes to heal wounds. -Continue to keep the wound clean, apply a thick layer of Vaseline over it, and keep a bandage over to prevent further irritation of the wound -You do not need to continue packing the wound anymore  Please come back sooner if you are noticing increased pain, fevers, drainage, redness of the wound.  Come back to see me in 2 weeks.

## 2023-05-08 NOTE — Telephone Encounter (Signed)
 Will send patient my chart message notifying about prescription

## 2023-05-12 DIAGNOSIS — N1832 Chronic kidney disease, stage 3b: Secondary | ICD-10-CM | POA: Diagnosis not present

## 2023-05-13 LAB — LAB REPORT - SCANNED
Calcium: 9.8
EGFR: 39
PTH: 58

## 2023-05-21 DIAGNOSIS — L570 Actinic keratosis: Secondary | ICD-10-CM | POA: Diagnosis not present

## 2023-05-21 DIAGNOSIS — L578 Other skin changes due to chronic exposure to nonionizing radiation: Secondary | ICD-10-CM | POA: Diagnosis not present

## 2023-05-21 DIAGNOSIS — D225 Melanocytic nevi of trunk: Secondary | ICD-10-CM | POA: Diagnosis not present

## 2023-05-21 DIAGNOSIS — D1722 Benign lipomatous neoplasm of skin and subcutaneous tissue of left arm: Secondary | ICD-10-CM | POA: Diagnosis not present

## 2023-05-21 DIAGNOSIS — L821 Other seborrheic keratosis: Secondary | ICD-10-CM | POA: Diagnosis not present

## 2023-05-21 DIAGNOSIS — L814 Other melanin hyperpigmentation: Secondary | ICD-10-CM | POA: Diagnosis not present

## 2023-05-21 DIAGNOSIS — Z86018 Personal history of other benign neoplasm: Secondary | ICD-10-CM | POA: Diagnosis not present

## 2023-05-21 DIAGNOSIS — L929 Granulomatous disorder of the skin and subcutaneous tissue, unspecified: Secondary | ICD-10-CM | POA: Diagnosis not present

## 2023-05-21 DIAGNOSIS — Z85828 Personal history of other malignant neoplasm of skin: Secondary | ICD-10-CM | POA: Diagnosis not present

## 2023-05-22 ENCOUNTER — Ambulatory Visit: Payer: Self-pay | Admitting: Student

## 2023-05-22 ENCOUNTER — Other Ambulatory Visit: Payer: Self-pay | Admitting: Family Medicine

## 2023-05-22 DIAGNOSIS — E1122 Type 2 diabetes mellitus with diabetic chronic kidney disease: Secondary | ICD-10-CM | POA: Diagnosis not present

## 2023-05-22 DIAGNOSIS — N1832 Chronic kidney disease, stage 3b: Secondary | ICD-10-CM | POA: Diagnosis not present

## 2023-05-22 DIAGNOSIS — N2581 Secondary hyperparathyroidism of renal origin: Secondary | ICD-10-CM | POA: Diagnosis not present

## 2023-05-22 DIAGNOSIS — T783XXA Angioneurotic edema, initial encounter: Secondary | ICD-10-CM | POA: Diagnosis not present

## 2023-05-22 DIAGNOSIS — I129 Hypertensive chronic kidney disease with stage 1 through stage 4 chronic kidney disease, or unspecified chronic kidney disease: Secondary | ICD-10-CM | POA: Diagnosis not present

## 2023-05-23 ENCOUNTER — Other Ambulatory Visit (HOSPITAL_BASED_OUTPATIENT_CLINIC_OR_DEPARTMENT_OTHER): Payer: Self-pay

## 2023-05-23 ENCOUNTER — Other Ambulatory Visit: Payer: Self-pay

## 2023-05-23 MED ORDER — METOPROLOL TARTRATE 25 MG PO TABS
75.0000 mg | ORAL_TABLET | Freq: Two times a day (BID) | ORAL | 0 refills | Status: DC
  Filled 2023-05-23 (×2): qty 540, 90d supply, fill #0

## 2023-05-23 MED FILL — Amlodipine Besylate Tab 5 MG (Base Equivalent): ORAL | 90 days supply | Qty: 90 | Fill #1 | Status: AC

## 2023-05-23 MED FILL — Amlodipine Besylate Tab 5 MG (Base Equivalent): ORAL | 90 days supply | Qty: 90 | Fill #1 | Status: CN

## 2023-05-26 ENCOUNTER — Other Ambulatory Visit (HOSPITAL_BASED_OUTPATIENT_CLINIC_OR_DEPARTMENT_OTHER): Payer: Self-pay

## 2023-06-01 ENCOUNTER — Other Ambulatory Visit: Payer: Self-pay | Admitting: Family Medicine

## 2023-06-01 DIAGNOSIS — E1140 Type 2 diabetes mellitus with diabetic neuropathy, unspecified: Secondary | ICD-10-CM

## 2023-06-02 ENCOUNTER — Other Ambulatory Visit: Payer: Self-pay | Admitting: Family Medicine

## 2023-06-02 ENCOUNTER — Other Ambulatory Visit: Payer: Self-pay

## 2023-06-02 ENCOUNTER — Other Ambulatory Visit (HOSPITAL_BASED_OUTPATIENT_CLINIC_OR_DEPARTMENT_OTHER): Payer: Self-pay

## 2023-06-02 DIAGNOSIS — E1140 Type 2 diabetes mellitus with diabetic neuropathy, unspecified: Secondary | ICD-10-CM

## 2023-06-04 ENCOUNTER — Telehealth: Payer: Self-pay

## 2023-06-04 ENCOUNTER — Other Ambulatory Visit: Payer: Self-pay | Admitting: Family Medicine

## 2023-06-04 ENCOUNTER — Other Ambulatory Visit (HOSPITAL_BASED_OUTPATIENT_CLINIC_OR_DEPARTMENT_OTHER): Payer: Self-pay

## 2023-06-04 ENCOUNTER — Encounter (HOSPITAL_BASED_OUTPATIENT_CLINIC_OR_DEPARTMENT_OTHER): Payer: Self-pay

## 2023-06-04 DIAGNOSIS — E1140 Type 2 diabetes mellitus with diabetic neuropathy, unspecified: Secondary | ICD-10-CM

## 2023-06-04 MED ORDER — EMPAGLIFLOZIN 25 MG PO TABS
25.0000 mg | ORAL_TABLET | Freq: Every day | ORAL | 0 refills | Status: DC
  Filled 2023-06-04: qty 30, 30d supply, fill #0

## 2023-06-04 NOTE — Telephone Encounter (Signed)
 Patient calls nurse line requesting a refill on Jardiance .   He reports he needs ~ 1 month worth of medication. He reports he establishes with his new PCP on 3/11.  He reports his new PCP will not fill before initial visit.   He reports he is unsure what to do as [facility] has retired and he is out of medication.  Advised will forward to Medical Director for assistance.   Please send to [facility] if appropriate.

## 2023-06-04 NOTE — Telephone Encounter (Signed)
 Patient contacted and made aware.   Patient appreciative.

## 2023-06-04 NOTE — Telephone Encounter (Signed)
 Refill sent.

## 2023-06-05 DIAGNOSIS — L929 Granulomatous disorder of the skin and subcutaneous tissue, unspecified: Secondary | ICD-10-CM | POA: Diagnosis not present

## 2023-06-09 ENCOUNTER — Other Ambulatory Visit: Payer: Self-pay | Admitting: Family Medicine

## 2023-06-10 ENCOUNTER — Other Ambulatory Visit (HOSPITAL_BASED_OUTPATIENT_CLINIC_OR_DEPARTMENT_OTHER): Payer: Self-pay

## 2023-06-10 MED ORDER — POTASSIUM CHLORIDE CRYS ER 10 MEQ PO TBCR
20.0000 meq | EXTENDED_RELEASE_TABLET | Freq: Every day | ORAL | 0 refills | Status: DC
  Filled 2023-06-10: qty 180, 90d supply, fill #0

## 2023-06-20 ENCOUNTER — Other Ambulatory Visit: Payer: Self-pay

## 2023-06-20 ENCOUNTER — Other Ambulatory Visit (HOSPITAL_BASED_OUTPATIENT_CLINIC_OR_DEPARTMENT_OTHER): Payer: Self-pay

## 2023-06-20 ENCOUNTER — Encounter (HOSPITAL_BASED_OUTPATIENT_CLINIC_OR_DEPARTMENT_OTHER): Payer: Self-pay | Admitting: Emergency Medicine

## 2023-06-20 ENCOUNTER — Emergency Department (HOSPITAL_BASED_OUTPATIENT_CLINIC_OR_DEPARTMENT_OTHER): Payer: Medicare PPO

## 2023-06-20 ENCOUNTER — Emergency Department (HOSPITAL_BASED_OUTPATIENT_CLINIC_OR_DEPARTMENT_OTHER)
Admission: EM | Admit: 2023-06-20 | Discharge: 2023-06-20 | Disposition: A | Discharge status: Home / Self Care | Payer: Medicare PPO | Attending: Emergency Medicine | Admitting: Emergency Medicine

## 2023-06-20 DIAGNOSIS — R140 Abdominal distension (gaseous): Secondary | ICD-10-CM | POA: Diagnosis not present

## 2023-06-20 DIAGNOSIS — R0789 Other chest pain: Secondary | ICD-10-CM | POA: Diagnosis not present

## 2023-06-20 DIAGNOSIS — T17908A Unspecified foreign body in respiratory tract, part unspecified causing other injury, initial encounter: Secondary | ICD-10-CM

## 2023-06-20 DIAGNOSIS — T17910A Gastric contents in respiratory tract, part unspecified causing asphyxiation, initial encounter: Secondary | ICD-10-CM | POA: Diagnosis not present

## 2023-06-20 DIAGNOSIS — K2100 Gastro-esophageal reflux disease with esophagitis, without bleeding: Secondary | ICD-10-CM | POA: Insufficient documentation

## 2023-06-20 DIAGNOSIS — R079 Chest pain, unspecified: Secondary | ICD-10-CM | POA: Diagnosis not present

## 2023-06-20 DIAGNOSIS — R072 Precordial pain: Secondary | ICD-10-CM | POA: Diagnosis present

## 2023-06-20 DIAGNOSIS — X58XXXA Exposure to other specified factors, initial encounter: Secondary | ICD-10-CM | POA: Diagnosis not present

## 2023-06-20 LAB — CBC WITH DIFFERENTIAL/PLATELET
Abs Immature Granulocytes: 0.01 10*3/uL (ref 0.00–0.07)
Basophils Absolute: 0 10*3/uL (ref 0.0–0.1)
Basophils Relative: 0 %
Eosinophils Absolute: 0.1 10*3/uL (ref 0.0–0.5)
Eosinophils Relative: 2 %
HCT: 40.8 % (ref 39.0–52.0)
Hemoglobin: 14.6 g/dL (ref 13.0–17.0)
Immature Granulocytes: 0 %
Lymphocytes Relative: 44 %
Lymphs Abs: 2.8 10*3/uL (ref 0.7–4.0)
MCH: 31.9 pg (ref 26.0–34.0)
MCHC: 35.8 g/dL (ref 30.0–36.0)
MCV: 89.1 fL (ref 80.0–100.0)
Monocytes Absolute: 0.4 10*3/uL (ref 0.1–1.0)
Monocytes Relative: 6 %
Neutro Abs: 3 10*3/uL (ref 1.7–7.7)
Neutrophils Relative %: 48 %
Platelets: 102 10*3/uL — ABNORMAL LOW (ref 150–400)
RBC: 4.58 MIL/uL (ref 4.22–5.81)
RDW: 13.7 % (ref 11.5–15.5)
WBC: 6.2 10*3/uL (ref 4.0–10.5)
nRBC: 0 % (ref 0.0–0.2)

## 2023-06-20 LAB — COMPREHENSIVE METABOLIC PANEL
ALT: 31 U/L (ref 0–44)
AST: 26 U/L (ref 15–41)
Albumin: 4.7 g/dL (ref 3.5–5.0)
Alkaline Phosphatase: 73 U/L (ref 38–126)
Anion gap: 12 (ref 5–15)
BUN: 23 mg/dL (ref 8–23)
CO2: 25 mmol/L (ref 22–32)
Calcium: 9.7 mg/dL (ref 8.9–10.3)
Chloride: 102 mmol/L (ref 98–111)
Creatinine, Ser: 1.48 mg/dL — ABNORMAL HIGH (ref 0.61–1.24)
GFR, Estimated: 47 mL/min — ABNORMAL LOW (ref >60)
Glucose, Bld: 164 mg/dL — ABNORMAL HIGH (ref 70–99)
Potassium: 3.1 mmol/L — ABNORMAL LOW (ref 3.5–5.1)
Sodium: 139 mmol/L (ref 135–145)
Total Bilirubin: 1.2 mg/dL (ref 0.0–1.2)
Total Protein: 8.1 g/dL (ref 6.5–8.1)

## 2023-06-20 LAB — TROPONIN I (HIGH SENSITIVITY)
Troponin I (High Sensitivity): 4 ng/L (ref <18)
Troponin I (High Sensitivity): 4 ng/L (ref <18)

## 2023-06-20 LAB — LIPASE, BLOOD: Lipase: 58 U/L — ABNORMAL HIGH (ref 11–51)

## 2023-06-20 MED ORDER — AMOXICILLIN-POT CLAVULANATE 875-125 MG PO TABS
1.0000 | ORAL_TABLET | Freq: Two times a day (BID) | ORAL | 0 refills | Status: DC
  Filled 2023-06-20: qty 14, 7d supply, fill #0

## 2023-06-20 MED ORDER — FAMOTIDINE 20 MG PO TABS
40.0000 mg | ORAL_TABLET | Freq: Once | ORAL | Status: DC
  Filled 2023-06-20: qty 2

## 2023-06-20 MED ORDER — ALUM & MAG HYDROXIDE-SIMETH 200-200-20 MG/5ML PO SUSP
30.0000 mL | Freq: Once | ORAL | Status: AC
  Administered 2023-06-20: 30 mL via ORAL
  Filled 2023-06-20: qty 30

## 2023-06-20 MED ORDER — FAMOTIDINE IN NACL 20-0.9 MG/50ML-% IV SOLN
20.0000 mg | Freq: Once | INTRAVENOUS | Status: AC
  Administered 2023-06-20: 20 mg via INTRAVENOUS
  Filled 2023-06-20: qty 50

## 2023-06-20 MED ORDER — LIDOCAINE VISCOUS HCL 2 % MT SOLN
15.0000 mL | Freq: Once | OROMUCOSAL | Status: AC
  Administered 2023-06-20: 15 mL via OROMUCOSAL
  Filled 2023-06-20: qty 15

## 2023-06-20 NOTE — ED Triage Notes (Signed)
 Pt reports that he was woken up out of sleep w/ SOB/substernal chest pain described as burning.  Pt believes that he has "a bout of reflux that went into my chest, burns like acid."

## 2023-06-20 NOTE — ED Provider Notes (Signed)
 [place] EMERGENCY DEPARTMENT AT [facility] Provider Note   CSN: [id number] Arrival date & time: 06/20/23  0255     History  No chief complaint on file.   Tanner Mason is a 83 y.o. male.  Presents to the emergency department for evaluation of substernal chest discomfort.  Patient reports that he woke up from sleep, was going to the bathroom when he felt like his reflux started acting up.  Patient reports that he felt the secretions go down into his lungs and he has had a burning pain and cough since.  Patient does take Prilosec daily for reflux.       Home Medications Prior to Admission medications   Medication Sig Start Date End Date Taking? Authorizing Provider  amoxicillin-clavulanate (AUGMENTIN) 875-125 MG tablet Take 1 tablet by mouth every 12 (twelve) hours. 06/20/23  Yes Analiza Cowger, Canary Brim, MD  ACCU-CHEK GUIDE test strip CHECKING FASTING ONCE DAILY 08/20/21   Carney Living, MD  amLODipine (NORVASC) 5 MG tablet Take 1 tablet (5 mg total) by mouth daily. 11/04/22   Carney Living, MD  atorvastatin (LIPITOR) 40 MG tablet Take 1 tablet (40 mg total) by mouth daily. 01/13/23   Carney Living, MD  Insulin Pen Needle (BD PEN NEEDLE NANO 2ND GEN) 32G X 4 MM MISC Use 2 (two) times daily. 11/12/22   Westley Chandler, MD  cetirizine (ZYRTEC) 10 MG tablet Take 10 mg by mouth every evening.    [provider]  empagliflozin (JARDIANCE) 25 MG TABS tablet Take 1 tablet (25 mg total) by mouth daily. 06/04/23   Doreene Eland, MD  EPINEPHrine (EPIPEN 2-PAK) 0.3 mg/0.3 mL IJ SOAJ injection INJECT 0.3MLS INTO THE MUSLE ONCE 05/12/19   Carney Living, MD  finasteride (PROSCAR) 5 MG tablet Take 1 tablet (5 mg total) by mouth daily. 07/23/22   Carney Living, MD  insulin glargine (LANTUS SOLOSTAR) 100 UNIT/ML Solostar Pen Inject 40 Units into the skin daily. 06/04/22   Carney Living, MD  metoprolol tartrate (LOPRESSOR) 25 MG tablet  Take 3 tablets (75 mg total) by mouth 2 (two) times daily AT 10 AM AND 5 PM. 05/23/23   Carney Living, MD  omeprazole (PRILOSEC) 20 MG capsule Take 1 capsule (20 mg total) by mouth daily as needed. 01/20/23   Carney Living, MD  potassium chloride (KLOR-CON M) 10 MEQ tablet Take 2 tablets (20 mEq total) by mouth daily. 06/10/23   Carney Living, MD  Semaglutide,0.25 or 0.5MG /DOS, (OZEMPIC, 0.25 OR 0.5 MG/DOSE,) 2 MG/3ML SOPN Inject 0.5 mg into the skin once a week. 05/08/23   Carney Living, MD  Semaglutide,0.25 or 0.5MG /DOS, 2 MG/3ML SOPN Inject 0.5 mg into the skin once a week. 05/08/23   Carney Living, MD  tamsulosin (FLOMAX) 0.4 MG CAPS capsule TAKE 2 CAPSULES BY MOUTH EVERY DAY 03/05/23   Carney Living, MD  VITAMIN D PO Take 1,000 Units by mouth as needed.    [provider]      Allergies    Ace inhibitors and Calcium carbonate antacid    Review of Systems   Review of Systems  Physical Exam Updated Vital Signs BP (!) 156/89   Pulse 98   Temp 97.7 F (36.5 C) (Oral)   Resp 10   Ht 5\' 7"  (1.702 m)   Wt 65.8 kg   SpO2 97%   BMI 22.71 kg/m  Physical Exam Vitals and nursing note reviewed.  Constitutional:      General: He is not in acute distress.    Appearance: He is well-developed.  HENT:     Head: Normocephalic and atraumatic.     Mouth/Throat:     Mouth: Mucous membranes are moist.  Eyes:     General: Vision grossly intact. Gaze aligned appropriately.     Extraocular Movements: Extraocular movements intact.     Conjunctiva/sclera: Conjunctivae normal.  Cardiovascular:     Rate and Rhythm: Normal rate and regular rhythm.     Pulses: Normal pulses.     Heart sounds: Normal heart sounds, S1 normal and S2 normal. No murmur heard.    No friction rub. No gallop.  Pulmonary:     Effort: Pulmonary effort is normal. No respiratory distress.     Breath sounds: Normal breath sounds.  Abdominal:     Palpations: Abdomen is soft.      Tenderness: There is no abdominal tenderness. There is no guarding or rebound.     Hernia: No hernia is present.  Musculoskeletal:        General: No swelling.     Cervical back: Full passive range of motion without pain, normal range of motion and neck supple. No pain with movement, spinous process tenderness or muscular tenderness. Normal range of motion.     Right lower leg: No edema.     Left lower leg: No edema.  Skin:    General: Skin is warm and dry.     Capillary Refill: Capillary refill takes less than 2 seconds.     Findings: No ecchymosis, erythema, lesion or wound.  Neurological:     Mental Status: He is alert and oriented to person, place, and time.     GCS: GCS eye subscore is 4. GCS verbal subscore is 5. GCS motor subscore is 6.     Cranial Nerves: Cranial nerves 2-12 are intact.     Sensory: Sensation is intact.     Motor: Motor function is intact. No weakness or abnormal muscle tone.     Coordination: Coordination is intact.  Psychiatric:        Mood and Affect: Mood normal.        Speech: Speech normal.        Behavior: Behavior normal.     ED Results / Procedures / Treatments   Labs (all labs ordered are listed, but only abnormal results are displayed) Labs Reviewed  CBC WITH DIFFERENTIAL/PLATELET - Abnormal; Notable for the following components:      Result Value   Platelets 102 (*)    All other components within normal limits  COMPREHENSIVE METABOLIC PANEL - Abnormal; Notable for the following components:   Potassium 3.1 (*)    Glucose, Bld 164 (*)    Creatinine, Ser 1.48 (*)    GFR, Estimated 47 (*)    All other components within normal limits  LIPASE, BLOOD - Abnormal; Notable for the following components:   Lipase 58 (*)    All other components within normal limits  TROPONIN I (HIGH SENSITIVITY)  TROPONIN I (HIGH SENSITIVITY)    EKG EKG Interpretation Date/Time:  Friday June 20 2023 03:04:48 EST Ventricular Rate:  108 PR  Interval:  141 QRS Duration:  74 QT Interval:  353 QTC Calculation: 474 R Axis:   17  Text Interpretation: Sinus tachycardia Low voltage, precordial leads Abnormal R-wave progression, early transition Nonspecific T abnormalities, lateral leads Artifact in lead(s) V5 V6 Confirmed by Gilda Crease [phone]) on 06/20/2023  3:05:28 AM  Radiology DG Chest 2 View Result Date: 06/20/2023 CLINICAL DATA:  83 year old male with chest pain, shortness of breath. EXAM: CHEST - 2 VIEW COMPARISON:  Portable chest 09/18/2015 and earlier. FINDINGS: AP and lateral views 0403 hours. Lung volumes and mediastinal contours remain within normal limits. Visualized tracheal air column is within normal limits. Both lungs appear clear. No pneumothorax or pleural effusion. No acute osseous abnormality identified. Paucity of bowel gas in the visible abdomen. IMPRESSION: Negative, no acute cardiopulmonary abnormality. Electronically Signed   By: Odessa Fleming M.D.   On: 06/20/2023 04:16    Procedures Procedures    Medications Ordered in ED Medications  lidocaine (XYLOCAINE) 2 % viscous mouth solution 15 mL (15 mLs Mouth/Throat Given 06/20/23 0322)  alum & mag hydroxide-simeth (MAALOX/MYLANTA) 200-200-20 MG/5ML suspension 30 mL (30 mLs Oral Given 06/20/23 0321)  famotidine (PEPCID) IVPB 20 mg premix (0 mg Intravenous Stopped 06/20/23 0400)    ED Course/ Medical Decision Making/ A&P                                 Medical Decision Making Amount and/or Complexity of Data Reviewed Labs: ordered. Radiology: ordered.  Risk OTC drugs. Prescription drug management.   Differential Diagnosis considered includes, but not limited to: STEMI; NSTEMI; myocarditis; pericarditis; pulmonary embolism; aortic dissection; pneumothorax; pneumonia; gastritis; musculoskeletal pain   Patient indicates that he feels like he possibly aspirated when he got up to go to the bathroom tonight.  He reports feeling a rattling in his chest and  a burning sensation in the center of his chest and esophagus area.  Sensation goes all the way up to the throat area.  Patient's cardiac exam has been unremarkable.  He is breathing without difficulty.  Symptoms significantly improved after oral antiacid and viscous lidocaine.  Chest x-ray without obvious infiltrate.  Will treat empirically with Augmentin for aspiration and continue his antacid regimen.        Final Clinical Impression(s) / ED Diagnoses Final diagnoses:  Gastroesophageal reflux disease with esophagitis without hemorrhage  Aspiration into airway, initial encounter    Rx / DC Orders ED Discharge Orders          Ordered    amoxicillin-clavulanate (AUGMENTIN) 875-125 MG tablet  Every 12 hours        06/20/23 0529              Gilda Crease, MD 06/20/23 [phone]

## 2023-06-23 ENCOUNTER — Other Ambulatory Visit: Payer: Self-pay | Admitting: Family Medicine

## 2023-06-23 ENCOUNTER — Other Ambulatory Visit (HOSPITAL_BASED_OUTPATIENT_CLINIC_OR_DEPARTMENT_OTHER): Payer: Self-pay

## 2023-06-23 MED ORDER — LANTUS SOLOSTAR 100 UNIT/ML ~~LOC~~ SOPN
40.0000 [IU] | PEN_INJECTOR | Freq: Every day | SUBCUTANEOUS | 3 refills | Status: AC
  Filled 2023-06-23: qty 30, 75d supply, fill #0
  Filled 2023-09-04: qty 30, 75d supply, fill #1
  Filled 2023-11-07: qty 30, 75d supply, fill #2
  Filled 2024-01-31: qty 30, 75d supply, fill #3
  Filled 2024-04-05: qty 30, 75d supply, fill #4

## 2023-07-01 ENCOUNTER — Other Ambulatory Visit (HOSPITAL_COMMUNITY): Payer: Self-pay

## 2023-07-08 ENCOUNTER — Encounter: Payer: Self-pay | Admitting: Family Medicine

## 2023-07-08 ENCOUNTER — Ambulatory Visit: Payer: Medicare PPO | Admitting: Family Medicine

## 2023-07-08 VITALS — BP 114/68 | HR 85 | Temp 97.8°F | Resp 20 | Ht 67.0 in | Wt 143.2 lb

## 2023-07-08 DIAGNOSIS — R351 Nocturia: Secondary | ICD-10-CM | POA: Diagnosis not present

## 2023-07-08 DIAGNOSIS — Z7985 Long-term (current) use of injectable non-insulin antidiabetic drugs: Secondary | ICD-10-CM | POA: Diagnosis not present

## 2023-07-08 DIAGNOSIS — I10 Essential (primary) hypertension: Secondary | ICD-10-CM | POA: Diagnosis not present

## 2023-07-08 DIAGNOSIS — E1140 Type 2 diabetes mellitus with diabetic neuropathy, unspecified: Secondary | ICD-10-CM

## 2023-07-08 DIAGNOSIS — N401 Enlarged prostate with lower urinary tract symptoms: Secondary | ICD-10-CM | POA: Diagnosis not present

## 2023-07-08 LAB — COMPREHENSIVE METABOLIC PANEL
ALT: 15 U/L (ref 0–53)
AST: 15 U/L (ref 0–37)
Albumin: 4.2 g/dL (ref 3.5–5.2)
Alkaline Phosphatase: 65 U/L (ref 39–117)
BUN: 20 mg/dL (ref 6–23)
CO2: 26 meq/L (ref 19–32)
Calcium: 9 mg/dL (ref 8.4–10.5)
Chloride: 109 meq/L (ref 96–112)
Creatinine, Ser: 1.68 mg/dL — ABNORMAL HIGH (ref 0.40–1.50)
GFR: 37.99 mL/min — ABNORMAL LOW (ref >60.00)
Glucose, Bld: 86 mg/dL (ref 70–99)
Potassium: 3.7 meq/L (ref 3.5–5.1)
Sodium: 142 meq/L (ref 135–145)
Total Bilirubin: 0.9 mg/dL (ref 0.2–1.2)
Total Protein: 6.4 g/dL (ref 6.0–8.3)

## 2023-07-08 LAB — LIPID PANEL
Cholesterol: 76 mg/dL (ref 0–200)
HDL: 29.3 mg/dL — ABNORMAL LOW (ref >39.00)
LDL Cholesterol: 18 mg/dL (ref 0–99)
NonHDL: 46.93
Total CHOL/HDL Ratio: 3
Triglycerides: 147 mg/dL (ref 0.0–149.0)
VLDL: 29.4 mg/dL (ref 0.0–40.0)

## 2023-07-08 LAB — HEMOGLOBIN A1C: Hgb A1c MFr Bld: 6.1 % (ref 4.6–6.5)

## 2023-07-08 NOTE — Progress Notes (Signed)
 Subjective:   Chief Complaint  Patient presents with   New Patient (Initial Visit)    Patient presents today for a new patient appointment.    Tanner Mason is a 83 y.o. male here for follow-up of diabetes.   Payam's self monitored glucose range is low 100's.  Patient denies hypoglycemic reactions. He checks his glucose levels several time(s) per week. Patient does require insulin.  Lantus 40 u nightly Medications include: Jardiance 25 mg/d, Ozempic 0.5 mg/week Diet is OK.  Exercise: some walking, wt lifting Most recent A1c was 8.6 in December.  Sugars much better controlled since starting Ozempic then.  Hypertension Patient presents for hypertension follow up. He does not monitor home blood pressures. He is compliant with medications- Lopressor 75 mg bid, Norvasc 5 mg/d. Patient has these side effects of medication: none Diet/exercise as above.  No Cp or SOB.    BPH Nocturia 2-3x/night. Compliant with Flomax 0.8 mg daily and finasteride 5 mg daily.  No adverse effects.  No bleeding, pain, or discharge.  Past Medical History:  Diagnosis Date   Abdominal ultrasound, abnormal 02/2004   fatty liver   Anemia    Angioedema 07/13/2014   Arthritis    bilateral thumbs    Cataract    removed on both eyes   Diabetes mellitus without complication (HCC)    DIASTASIS RECTI 12/08/2007   Qualifier: Diagnosis of  By: Sheffield Slider MD, Wayne  Present, but causing no problems    Elbow dislocation 2956,2130   Right   History of esophagogastroduodenoscopy 08/13/2007   normal   Hypertension    controlled with medication    Treadmill stress test negative for angina pectoris 06/2002   poor HR and BP recovery   ULNAR NERVE ENTRAPMENT, RIGHT 06/07/2008   Qualifier: Diagnosis of  By: Sheffield Slider MD, Deniece Portela       Related testing: Retinal exam: Done Pneumovax: done  Objective:  BP 114/68   Pulse 85   Temp 97.8 F (36.6 C)   Resp 20   Ht 5\' 7"  (1.702 m)   Wt 143 lb 3.2 oz (65 kg)   SpO2  96%   BMI 22.43 kg/m  General:  Well developed, well nourished, in no apparent distress Skin:  Warm, no pallor or diaphoresis Lungs:  CTAB, no access msc use Cardio:  RRR, no bruits, no LE edema Musculoskeletal:  Symmetrical muscle groups noted without atrophy or deformity Neuro:  Sensation is not intact to pinprick over the forefoot or toes bilaterally. Psych: Age appropriate judgment and insight  Assessment:   Type 2 diabetes mellitus with diabetic neuropathy, unspecified whether long term insulin use (HCC) - Plan: Lipid panel, Hemoglobin A1c, Comprehensive metabolic panel  Essential hypertension, benign  Benign prostatic hyperplasia with nocturia   Plan:   Chronic, not stable.  For now he will continue Jardiance 25 mg daily, Lantus 40 units nightly, and Ozempic 0.5 mg weekly.  If not controlled, would increase Ozempic.  Continue to monitor blood pressures at home.  Counseled on diet and exercise.  Due to his decreased sensation of the feet, he will frequently monitor skin for cracks/wounds.  He will continue to moisturize the skin on his feet. Chronic, stable.  Continue Norvasc 5 mg daily, metoprolol tartrate to 75 mg twice daily.  He had angioedema with ACE inhibitors. Chronic, stable.  Continue Flomax 0.8 mg daily, finasteride 5 mg daily.  He will let me know if symptoms worsen where he wants to see a urologist. F/u in 3-6  mo. The patient voiced understanding and agreement to the plan.  Jilda Roche Williamson, DO 07/08/23 11:52 AM

## 2023-07-08 NOTE — Patient Instructions (Signed)
Give us 2-3 business days to get the results of your labs back.   Keep the diet clean and stay active.  Aim to do some physical exertion for 150 minutes per week. This is typically divided into 5 days per week, 30 minutes per day. The activity should be enough to get your heart rate up. Anything is better than nothing if you have time constraints.  Let us know if you need anything.  

## 2023-07-10 ENCOUNTER — Encounter: Payer: Self-pay | Admitting: *Deleted

## 2023-07-14 ENCOUNTER — Other Ambulatory Visit (HOSPITAL_BASED_OUTPATIENT_CLINIC_OR_DEPARTMENT_OTHER): Payer: Self-pay

## 2023-07-14 ENCOUNTER — Other Ambulatory Visit: Payer: Self-pay | Admitting: Family Medicine

## 2023-07-14 DIAGNOSIS — E1140 Type 2 diabetes mellitus with diabetic neuropathy, unspecified: Secondary | ICD-10-CM

## 2023-07-14 MED ORDER — EMPAGLIFLOZIN 25 MG PO TABS
25.0000 mg | ORAL_TABLET | Freq: Every day | ORAL | 2 refills | Status: DC
  Filled 2023-07-14: qty 90, 90d supply, fill #0
  Filled 2023-10-07: qty 90, 90d supply, fill #1
  Filled 2024-01-02: qty 90, 90d supply, fill #2

## 2023-07-14 MED FILL — Atorvastatin Calcium Tab 40 MG (Base Equivalent): ORAL | 90 days supply | Qty: 90 | Fill #2 | Status: AC

## 2023-08-06 ENCOUNTER — Other Ambulatory Visit (HOSPITAL_BASED_OUTPATIENT_CLINIC_OR_DEPARTMENT_OTHER): Payer: Self-pay

## 2023-08-06 ENCOUNTER — Other Ambulatory Visit: Payer: Self-pay | Admitting: Family Medicine

## 2023-08-06 MED ORDER — OZEMPIC (0.25 OR 0.5 MG/DOSE) 2 MG/3ML ~~LOC~~ SOPN
0.5000 mg | PEN_INJECTOR | SUBCUTANEOUS | 2 refills | Status: DC
  Filled 2023-08-06: qty 9, 84d supply, fill #0
  Filled 2023-10-27: qty 9, 84d supply, fill #1
  Filled 2024-01-02: qty 9, 84d supply, fill #2

## 2023-08-07 ENCOUNTER — Other Ambulatory Visit (HOSPITAL_BASED_OUTPATIENT_CLINIC_OR_DEPARTMENT_OTHER): Payer: Self-pay

## 2023-08-14 MED FILL — Amlodipine Besylate Tab 5 MG (Base Equivalent): ORAL | 90 days supply | Qty: 90 | Fill #2 | Status: AC

## 2023-08-18 ENCOUNTER — Other Ambulatory Visit (HOSPITAL_BASED_OUTPATIENT_CLINIC_OR_DEPARTMENT_OTHER): Payer: Self-pay

## 2023-08-18 ENCOUNTER — Other Ambulatory Visit: Payer: Self-pay | Admitting: Family Medicine

## 2023-08-18 MED FILL — Tamsulosin HCl Cap 0.4 MG: ORAL | 90 days supply | Qty: 180 | Fill #0 | Status: AC

## 2023-08-20 ENCOUNTER — Other Ambulatory Visit (HOSPITAL_BASED_OUTPATIENT_CLINIC_OR_DEPARTMENT_OTHER): Payer: Self-pay

## 2023-08-20 ENCOUNTER — Other Ambulatory Visit: Payer: Self-pay | Admitting: Family Medicine

## 2023-08-20 MED ORDER — METOPROLOL TARTRATE 25 MG PO TABS
75.0000 mg | ORAL_TABLET | Freq: Two times a day (BID) | ORAL | 3 refills | Status: AC
  Filled 2023-08-20: qty 540, 90d supply, fill #0
  Filled 2023-10-27 – 2023-11-03 (×3): qty 540, 90d supply, fill #1
  Filled 2024-02-13: qty 540, 90d supply, fill #2
  Filled 2024-05-10: qty 540, 90d supply, fill #3

## 2023-09-04 ENCOUNTER — Other Ambulatory Visit (HOSPITAL_BASED_OUTPATIENT_CLINIC_OR_DEPARTMENT_OTHER): Payer: Self-pay

## 2023-09-08 ENCOUNTER — Other Ambulatory Visit: Payer: Self-pay | Admitting: Family Medicine

## 2023-09-08 ENCOUNTER — Other Ambulatory Visit (HOSPITAL_BASED_OUTPATIENT_CLINIC_OR_DEPARTMENT_OTHER): Payer: Self-pay

## 2023-09-08 MED ORDER — POTASSIUM CHLORIDE CRYS ER 10 MEQ PO TBCR
20.0000 meq | EXTENDED_RELEASE_TABLET | Freq: Every day | ORAL | 3 refills | Status: AC
  Filled 2023-09-08: qty 180, 90d supply, fill #0
  Filled 2023-12-08: qty 180, 90d supply, fill #1
  Filled 2024-03-04: qty 180, 90d supply, fill #2

## 2023-09-09 DIAGNOSIS — L929 Granulomatous disorder of the skin and subcutaneous tissue, unspecified: Secondary | ICD-10-CM | POA: Diagnosis not present

## 2023-09-09 DIAGNOSIS — Z87828 Personal history of other (healed) physical injury and trauma: Secondary | ICD-10-CM | POA: Diagnosis not present

## 2023-10-10 MED FILL — Atorvastatin Calcium Tab 40 MG (Base Equivalent): ORAL | 90 days supply | Qty: 90 | Fill #3 | Status: AC

## 2023-10-27 ENCOUNTER — Other Ambulatory Visit (HOSPITAL_BASED_OUTPATIENT_CLINIC_OR_DEPARTMENT_OTHER): Payer: Self-pay

## 2023-10-27 MED FILL — Tamsulosin HCl Cap 0.4 MG: ORAL | 90 days supply | Qty: 180 | Fill #1 | Status: CN

## 2023-10-28 ENCOUNTER — Other Ambulatory Visit: Payer: Self-pay | Admitting: Family Medicine

## 2023-10-28 ENCOUNTER — Other Ambulatory Visit (HOSPITAL_BASED_OUTPATIENT_CLINIC_OR_DEPARTMENT_OTHER): Payer: Self-pay

## 2023-10-28 MED ORDER — BD PEN NEEDLE NANO U/F 32G X 4 MM MISC
Freq: Two times a day (BID) | 2 refills | Status: AC
  Filled 2023-10-28: qty 100, 50d supply, fill #0
  Filled 2023-12-31: qty 100, 50d supply, fill #1

## 2023-10-29 ENCOUNTER — Other Ambulatory Visit (HOSPITAL_BASED_OUTPATIENT_CLINIC_OR_DEPARTMENT_OTHER): Payer: Self-pay

## 2023-10-29 ENCOUNTER — Other Ambulatory Visit: Payer: Self-pay

## 2023-10-30 ENCOUNTER — Other Ambulatory Visit (HOSPITAL_BASED_OUTPATIENT_CLINIC_OR_DEPARTMENT_OTHER): Payer: Self-pay

## 2023-11-03 MED FILL — Tamsulosin HCl Cap 0.4 MG: ORAL | 90 days supply | Qty: 180 | Fill #1 | Status: AC

## 2023-11-07 ENCOUNTER — Other Ambulatory Visit: Payer: Self-pay

## 2023-11-07 ENCOUNTER — Other Ambulatory Visit (HOSPITAL_BASED_OUTPATIENT_CLINIC_OR_DEPARTMENT_OTHER): Payer: Self-pay

## 2023-11-07 ENCOUNTER — Other Ambulatory Visit: Payer: Self-pay | Admitting: Family Medicine

## 2023-11-07 MED ORDER — FINASTERIDE 5 MG PO TABS
5.0000 mg | ORAL_TABLET | Freq: Every day | ORAL | 3 refills | Status: AC
  Filled 2023-11-07: qty 90, 90d supply, fill #0
  Filled 2024-01-02 – 2024-01-21 (×2): qty 90, 90d supply, fill #1
  Filled 2024-04-26: qty 90, 90d supply, fill #2

## 2023-11-12 ENCOUNTER — Other Ambulatory Visit (HOSPITAL_BASED_OUTPATIENT_CLINIC_OR_DEPARTMENT_OTHER): Payer: Self-pay

## 2023-11-12 ENCOUNTER — Other Ambulatory Visit: Payer: Self-pay | Admitting: Family Medicine

## 2023-11-12 MED ORDER — AMLODIPINE BESYLATE 5 MG PO TABS
5.0000 mg | ORAL_TABLET | Freq: Every day | ORAL | 3 refills | Status: DC
  Filled 2023-11-12: qty 90, 90d supply, fill #0

## 2023-11-17 DIAGNOSIS — N1832 Chronic kidney disease, stage 3b: Secondary | ICD-10-CM | POA: Diagnosis not present

## 2023-11-25 ENCOUNTER — Other Ambulatory Visit: Payer: Self-pay

## 2023-11-25 ENCOUNTER — Other Ambulatory Visit (HOSPITAL_BASED_OUTPATIENT_CLINIC_OR_DEPARTMENT_OTHER): Payer: Self-pay

## 2023-11-25 DIAGNOSIS — E1122 Type 2 diabetes mellitus with diabetic chronic kidney disease: Secondary | ICD-10-CM | POA: Diagnosis not present

## 2023-11-25 DIAGNOSIS — I129 Hypertensive chronic kidney disease with stage 1 through stage 4 chronic kidney disease, or unspecified chronic kidney disease: Secondary | ICD-10-CM | POA: Diagnosis not present

## 2023-11-25 DIAGNOSIS — N2581 Secondary hyperparathyroidism of renal origin: Secondary | ICD-10-CM | POA: Diagnosis not present

## 2023-11-25 DIAGNOSIS — T783XXA Angioneurotic edema, initial encounter: Secondary | ICD-10-CM | POA: Diagnosis not present

## 2023-11-25 DIAGNOSIS — N1832 Chronic kidney disease, stage 3b: Secondary | ICD-10-CM | POA: Diagnosis not present

## 2023-11-25 MED ORDER — AMLODIPINE BESYLATE 2.5 MG PO TABS
2.5000 mg | ORAL_TABLET | Freq: Every day | ORAL | 3 refills | Status: DC
  Filled 2023-11-25 (×2): qty 90, 90d supply, fill #0

## 2023-11-27 ENCOUNTER — Other Ambulatory Visit: Payer: Self-pay

## 2023-11-27 ENCOUNTER — Other Ambulatory Visit (HOSPITAL_BASED_OUTPATIENT_CLINIC_OR_DEPARTMENT_OTHER): Payer: Self-pay

## 2023-11-27 DIAGNOSIS — S80861A Insect bite (nonvenomous), right lower leg, initial encounter: Secondary | ICD-10-CM | POA: Diagnosis not present

## 2023-11-27 DIAGNOSIS — L723 Sebaceous cyst: Secondary | ICD-10-CM | POA: Diagnosis not present

## 2023-11-27 MED ORDER — CEPHALEXIN 500 MG PO CAPS
500.0000 mg | ORAL_CAPSULE | Freq: Two times a day (BID) | ORAL | 0 refills | Status: DC
  Filled 2023-11-27: qty 20, 10d supply, fill #0

## 2023-11-27 MED ORDER — BETAMETHASONE DIPROPIONATE AUG 0.05 % EX CREA
1.0000 | TOPICAL_CREAM | Freq: Two times a day (BID) | CUTANEOUS | 0 refills | Status: DC
  Filled 2023-11-27: qty 15, 15d supply, fill #0
  Filled 2023-12-05 – 2023-12-09 (×3): qty 15, 15d supply, fill #1

## 2023-12-05 ENCOUNTER — Other Ambulatory Visit (HOSPITAL_BASED_OUTPATIENT_CLINIC_OR_DEPARTMENT_OTHER): Payer: Self-pay

## 2023-12-08 ENCOUNTER — Other Ambulatory Visit (HOSPITAL_BASED_OUTPATIENT_CLINIC_OR_DEPARTMENT_OTHER): Payer: Self-pay

## 2023-12-08 ENCOUNTER — Other Ambulatory Visit: Payer: Self-pay

## 2023-12-16 ENCOUNTER — Ambulatory Visit (INDEPENDENT_AMBULATORY_CARE_PROVIDER_SITE_OTHER)

## 2023-12-16 VITALS — Ht 67.0 in | Wt 143.0 lb

## 2023-12-16 DIAGNOSIS — Z0000 Encounter for general adult medical examination without abnormal findings: Secondary | ICD-10-CM

## 2023-12-16 NOTE — Progress Notes (Signed)
 Subjective:   Tanner Mason is a 83 y.o. who presents for a Medicare Wellness preventive visit.  As a reminder, Annual Wellness Visits don't include a physical exam, and some assessments may be limited, especially if this visit is performed virtually. We may recommend an in-person follow-up visit with your provider if needed.  Visit Complete: Virtual I connected with  Tanner Mason on 12/16/23 by a audio enabled telemedicine application and verified that I am speaking with the correct person using two identifiers.  Patient Location: Home  Provider Location: Home Office  I discussed the limitations of evaluation and management by telemedicine. The patient expressed understanding and agreed to proceed.  Vital Signs: Because this visit was a virtual/telehealth visit, some criteria may be missing or patient reported. Any vitals not documented were not able to be obtained and vitals that have been documented are patient reported.  VideoDeclined- This patient declined Librarian, academic. Therefore the visit was completed with audio only.  Persons Participating in Visit: Patient.  AWV Questionnaire: No: Patient Medicare AWV questionnaire was not completed prior to this visit.  Cardiac Risk Factors include: advanced age (>76men, >49 women);dyslipidemia;diabetes mellitus;hypertension;male gender     Objective:    Today's Vitals   12/16/23 1024  Weight: 143 lb (64.9 kg)  Height: 5' 7 (1.702 m)   Body mass index is 22.4 kg/m.     12/16/2023   10:27 AM 06/20/2023    4:29 AM 04/09/2023    8:45 AM 12/13/2022    2:11 PM 10/02/2022    9:53 AM 07/23/2022   10:13 AM 04/24/2022    9:12 AM  Advanced Directives  Does Patient Have a Medical Advance Directive? No No No No No No No  Would patient like information on creating a medical advance directive? Yes (MAU/Ambulatory/Procedural Areas - Information given)  No - Patient declined Yes  (MAU/Ambulatory/Procedural Areas - Information given) No - Patient declined Yes (MAU/Ambulatory/Procedural Areas - Information given) No - Patient declined    Current Medications (verified) Outpatient Encounter Medications as of 12/16/2023  Medication Sig   ACCU-CHEK GUIDE test strip CHECKING FASTING ONCE DAILY   amLODipine  (NORVASC ) 2.5 MG tablet Take 1 tablet (2.5 mg total) by mouth daily.   amLODipine  (NORVASC ) 5 MG tablet Take 1 tablet (5 mg total) by mouth daily.   atorvastatin  (LIPITOR) 40 MG tablet Take 1 tablet (40 mg total) by mouth daily.   augmented betamethasone  dipropionate (DIPROLENE -AF) 0.05 % cream Apply 1 Application topically 2 (two) times daily.   cetirizine  (ZYRTEC ) 10 MG tablet Take 10 mg by mouth every evening.   empagliflozin  (JARDIANCE ) 25 MG TABS tablet Take 1 tablet (25 mg total) by mouth daily.   EPINEPHrine  (EPIPEN  2-PAK) 0.3 mg/0.3 mL IJ SOAJ injection INJECT 0.3MLS INTO THE MUSLE ONCE   finasteride  (PROSCAR ) 5 MG tablet Take 1 tablet (5 mg total) by mouth daily.   insulin  glargine (LANTUS  SOLOSTAR) 100 UNIT/ML Solostar Pen Inject 40 Units into the skin daily.   Insulin  Pen Needle (BD PEN NEEDLE NANO U/F) 32G X 4 MM MISC Use 2 (two) times daily.   metoprolol  tartrate (LOPRESSOR ) 25 MG tablet Take 3 tablets (75 mg total) by mouth 2 (two) times daily AT 10 AM AND 5 PM.   omeprazole  (PRILOSEC) 20 MG capsule Take 1 capsule (20 mg total) by mouth daily as needed.   potassium chloride  (KLOR-CON  M) 10 MEQ tablet Take 2 tablets (20 mEq total) by mouth daily.   Semaglutide ,0.25 or  0.5MG /DOS, (OZEMPIC , 0.25 OR 0.5 MG/DOSE,) 2 MG/3ML SOPN Inject 0.5 mg into the skin once a week.   tamsulosin  (FLOMAX ) 0.4 MG CAPS capsule Take 2 capsules (0.8 mg total) by mouth daily.   VITAMIN D PO Take 1,000 Units by mouth as needed.   cephALEXin  (KEFLEX ) 500 MG capsule Take 1 capsule (500 mg total) by mouth 2 (two) times daily. (Patient not taking: Reported on 12/16/2023)   No  facility-administered encounter medications on file as of 12/16/2023.    Allergies (verified) Ace inhibitors and Calcium  carbonate antacid   History: Past Medical History:  Diagnosis Date   Abdominal ultrasound, abnormal 02/2004   fatty liver   Anemia    Angioedema 07/13/2014   Arthritis    bilateral thumbs    Cataract    removed on both eyes   Diabetes mellitus without complication (HCC)    DIASTASIS RECTI 12/08/2007   Qualifier: Diagnosis of  By: Levonne MD, Wayne  Present, but causing no problems    Elbow dislocation 8038,8021   Right   History of esophagogastroduodenoscopy 08/13/2007   normal   Hypertension    controlled with medication    Treadmill stress test negative for angina pectoris 06/2002   poor HR and BP recovery   ULNAR NERVE ENTRAPMENT, RIGHT 06/07/2008   Qualifier: Diagnosis of  By: Levonne MD, Lemond     Past Surgical History:  Procedure Laterality Date   ANTERIOR CRUCIATE LIGAMENT REPAIR  5/98   Left, staph infection complicated   BASAL CELL CARCINOMA EXCISION  02/2005   R ear   BLEPHAROPLASTY  03/2005   with ptosis repair   cataract surgery Left 2018   INGUINAL HERNIA REPAIR Right 1947   INGUINAL HERNIA REPAIR Left 1970   Family History  Problem Relation Age of Onset   Alzheimer's disease Mother        died age 98   Hypertension Mother    Aortic aneurysm Father        died age 38   Anuerysm Father    Hypertension Father    Aortic aneurysm Brother        repaired plus AI graft   Diabetes Brother    Heart disease Brother    Stroke Brother    Social History   Socioeconomic History   Marital status: Married    Spouse name: Jori   Number of children: 2   Years of education: 16   Highest education level: Master's degree (e.g., MA, MS, MEng, MEd, MSW, MBA)  Occupational History   Occupation: MEDIA SPECIALIST    Employer: RETIRED  Tobacco Use   Smoking status: Former    Current packs/day: 0.00    Average packs/day: 1 pack/day for 25.0  years (25.0 ttl pk-yrs)    Types: Cigarettes    Start date: 11/18/1942    Quit date: 06/10/1967    Years since quitting: 56.5    Passive exposure: Past   Smokeless tobacco: Never  Vaping Use   Vaping status: Never Used  Substance and Sexual Activity   Alcohol use: Not Currently   Drug use: No   Sexual activity: Yes    Partners: Female  Other Topics Concern   Not on file  Social History Narrative   Daughter, Lyle in [place]   Daugher, [place]wood, in [place]   5 Grandchildren total.       He retired from teaching in 2010       Emergency Contact: wife Jori   Who lives  with you: wife   Any pets: none   Diet: Patient has a varied diet of vegetables, protein, and starch.  Pt reports enjoying his sweets and carbohydrates.   Exercise: Patient exercises 4x or more a week. Patient and wife enjoy walking on the [place]. Due to his balance he is no longer biking.    Pt and his wife enjoy their communites yoga class they attend 2x weekly. [facility]    Seatbelts: Pt reports wearing seatbelt when in vehicle. Pt wears a helmet while riding his bike.   [place] Exposure/Protection: Pt reports wearing sun screen/hat and is under care of dermatologist.   Hobbies: [facility] house and [facility]. Patient and wife have lunch and/or dinners with couples at least 2x per week.    One story home.    Right handed.         Social Drivers of Corporate investment banker Strain: Low Risk  (12/16/2023)   Overall Financial Resource Strain (CARDIA)    Difficulty of Paying Living Expenses: Not hard at all  Food Insecurity: No Food Insecurity (12/16/2023)   Hunger Vital Sign    Worried About Running Out of Food in the Last Year: Never true    Ran Out of Food in the Last Year: Never true  Transportation Needs: No Transportation Needs (12/16/2023)   PRAPARE - Administrator, Civil Service (Medical): No    Lack of Transportation (Non-Medical): No   Physical Activity: Sufficiently Active (12/16/2023)   Exercise Vital Sign    Days of Exercise per Week: 5 days    Minutes of Exercise per Session: 60 min  Stress: No Stress Concern Present (12/16/2023)   [employer] of Occupational Health - Occupational Stress Questionnaire    Feeling of Stress: Not at all  Social Connections: Moderately Isolated (12/16/2023)   Social Connection and Isolation Panel    Frequency of Communication with Friends and Family: More than three times a week    Frequency of Social Gatherings with Friends and Family: More than three times a week    Attends Religious Services: Never    Database administrator or Organizations: No    Attends Engineer, structural: Never    Marital Status: Married    Tobacco Counseling Counseling given: Not Answered    Clinical Intake:  Pre-visit preparation completed: Yes  Pain : No/denies pain  Diabetes: Yes CBG done?: No Did pt. bring in CBG monitor from home?: No  Lab Results  Component Value Date   HGBA1C 6.1 07/08/2023   HGBA1C 8.6 (A) 04/09/2023   HGBA1C 7.6 (A) 01/22/2023     How often do you need to have someone help you when you read instructions, pamphlets, or other written materials from your doctor or pharmacy?: 1 - Never  Interpreter Needed?: No  Information entered by :: Charmaine Bloodgood LPN   Activities of Daily Living     12/16/2023   10:25 AM  In your present state of health, do you have any difficulty performing the following activities:  Hearing? 0  Vision? 0  Difficulty concentrating or making decisions? 0  Walking or climbing stairs? 0  Dressing or bathing? 0  Doing errands, shopping? 0  Preparing Food and eating ? N  Using the Toilet? N  In the past six months, have you accidently leaked urine? N  Do you have problems with loss of bowel control? N  Managing your Medications?  N  Managing your Finances? N  Housekeeping or managing your Housekeeping? N    Patient Care  Team: Frann Mabel Mt, DO as PCP - General (Family Medicine) Patel, Donika K, DO as Consulting Physician (Neurology) Patrcia Sharper, MD as Consulting Physician (Ophthalmology) Jerrye Katheryn BROCKS, MD as Consulting Physician (Nephrology) Haverstock, Tawni CROME, MD as Referring Physician (Dermatology)  I have updated your Care Teams any recent Medical Services you may have received from other providers in the past year.     Assessment:   This is a routine wellness examination for Tanner Mason.  Hearing/Vision screen Hearing Screening - Comments:: Denies hearing difficulties   Vision Screening - Comments:: Wears rx glasses - up to date with routine eye exams with Dr. Patrcia    Goals Addressed             This Visit's Progress    Remain active and independent   On track      Depression Screen     12/16/2023   10:26 AM 07/08/2023   10:28 AM 04/09/2023    8:45 AM 03/25/2023    2:57 PM 01/22/2023   10:42 AM 12/13/2022    2:11 PM 10/02/2022    9:53 AM  PHQ 2/9 Scores  PHQ - 2 Score 0 0 0 0 0 0 0  PHQ- 9 Score   0 0 0  0    Fall Risk     12/16/2023   10:27 AM 07/08/2023   10:28 AM 04/09/2023    8:45 AM 01/22/2023   10:42 AM 12/13/2022    2:12 PM  Fall Risk   Falls in the past year? 0 0 0 0 0  Number falls in past yr: 0 0 0 0 0  Injury with Fall? 0 0 0 0 0  Risk for fall due to : No Fall Risks No Fall Risks No Fall Risks  No Fall Risks  Follow up Falls prevention discussed;Education provided;Falls evaluation completed Falls evaluation completed Falls evaluation completed  Falls prevention discussed;Education provided;Falls evaluation completed    MEDICARE RISK AT HOME:  Medicare Risk at Home Any stairs in or around the home?: Yes If so, are there any without handrails?: No Home free of loose throw rugs in walkways, pet beds, electrical cords, etc?: Yes Adequate lighting in your home to reduce risk of falls?: Yes Life alert?: No Use of a cane, walker or w/c?: No Grab  bars in the bathroom?: Yes Shower chair or bench in shower?: No Elevated toilet seat or a handicapped toilet?: Yes  TIMED UP AND GO:  Was the test performed?  No  Cognitive Function: Declined/Normal: No cognitive concerns noted by patient or family. Patient alert, oriented, able to answer questions appropriately and recall recent events. No signs of memory loss or confusion.    06/28/2013    3:00 PM 01/21/2012    4:00 PM 11/29/2010   11:00 AM  MMSE - Mini Mental State Exam  Orientation to time 5  5  5    Orientation to Place 5  5  5    Registration 3  3  3    Attention/ Calculation 5  5  5    Recall 3  3  3    Language- name 2 objects 2  2  2    Language- repeat 1 1 1   Language- follow 3 step command 3  3  3    Language- read & follow direction 1  1  1    Write a sentence 1  1  1  Copy design 1  1  1    Total score 30  30  30       Data saved with a previous flowsheet row definition        12/13/2022    2:12 PM 03/27/2022    9:19 AM 09/23/2018    2:29 PM  6CIT Screen  What Year? 0 points 0 points 0 points  What month? 0 points 0 points 0 points  What time? 0 points 0 points 0 points  Count back from 20 0 points 0 points 0 points  Months in reverse 0 points 0 points 0 points  Repeat phrase 0 points 0 points 0 points  Total Score 0 points 0 points 0 points    Immunizations Immunization History  Administered Date(s) Administered   Influenza Split 01/21/2012   Influenza Whole 12/28/2005, 01/27/2008, 01/31/2009, 03/06/2010   Influenza, High Dose Seasonal PF 01/17/2015   Influenza-Unspecified 12/28/2012, 01/31/2014   PFIZER Comirnaty(Gray Top)Covid-19 Tri-Sucrose Vaccine 08/31/2020   PFIZER(Purple Top)SARS-COV-2 Vaccination 06/05/2019, 06/26/2019, 01/25/2020   Pfizer Covid-19 Vaccine Bivalent Booster 65yrs & up 01/16/2021   Pfizer(Comirnaty)Fall Seasonal Vaccine 12 years and older 03/05/2022   Pneumococcal Conjugate-13 11/08/2014   Pneumococcal Polysaccharide-23 02/28/1996,  03/23/1996, 04/04/2009   Td 03/29/2004   Tdap 11/08/2014   Unspecified SARS-COV-2 Vaccination 03/05/2022   Zoster Recombinant(Shingrix) 01/16/2021   Zoster, Live 12/06/2009    Screening Tests Health Maintenance  Topic Date Due   HEMOGLOBIN A1C  01/08/2024   OPHTHALMOLOGY EXAM  05/06/2024   Diabetic kidney evaluation - Urine ACR  05/23/2024   Diabetic kidney evaluation - eGFR measurement  07/07/2024   FOOT EXAM  07/07/2024   LIPID PANEL  07/07/2024   DTaP/Tdap/Td (3 - Td or Tdap) 11/07/2024   Medicare Annual Wellness (AWV)  12/15/2024   Pneumococcal Vaccine: 50+ Years  Completed   HPV VACCINES  Aged Out   Meningococcal B Vaccine  Aged Out   Pneumococcal Vaccine  Discontinued   Colonoscopy  Discontinued   COVID-19 Vaccine  Discontinued   Hepatitis C Screening  Discontinued   Zoster Vaccines- Shingrix  Discontinued    Health Maintenance  There are no preventive care reminders to display for this patient.  Additional Screening:  Vision Screening: Recommended annual ophthalmology exams for early detection of glaucoma and other disorders of the eye. Would you like a referral to an eye doctor? No    Dental Screening: Recommended annual dental exams for proper oral hygiene  Community Resource Referral / Chronic Care Management: CRR required this visit?  No   CCM required this visit?  No   Plan:    I have personally reviewed and noted the following in the patient's chart:   Medical and social history Use of alcohol, tobacco or illicit drugs  Current medications and supplements including opioid prescriptions. Patient is not currently taking opioid prescriptions. Functional ability and status Nutritional status Physical activity Advanced directives List of other physicians Hospitalizations, surgeries, and ER visits in previous 12 months Vitals Screenings to include cognitive, depression, and falls Referrals and appointments  In addition, I have reviewed and  discussed with patient certain preventive protocols, quality metrics, and best practice recommendations. A written personalized care plan for preventive services as well as general preventive health recommendations were provided to patient.   Lavelle Pfeiffer [place], [place]   1/80/7974   After Visit Summary: (MyChart) Due to this being a telephonic visit, the after visit summary with patients personalized plan was offered to patient via MyChart   Notes:  Nothing significant to report at this time.

## 2023-12-16 NOTE — Patient Instructions (Signed)
 [name] , Thank you for taking time out of your busy schedule to complete your Annual Wellness Visit with me. I enjoyed our conversation and look forward to speaking with you again next year. I, as well as your care team,  appreciate your ongoing commitment to your health goals. Please review the following plan we discussed and let me know if I can assist you in the future. Your Game plan/ To Do List     Follow up Visits: We will see or speak with you next year for your Next Medicare AWV with our clinical staff Have you seen your provider in the last 6 months (3 months if uncontrolled diabetes)? Yes  Clinician Recommendations:  Aim for 30 minutes of exercise or brisk walking, 6-8 glasses of water, and 5 servings of fruits and vegetables each day.       This is a list of the screenings recommended for you:  Health Maintenance  Topic Date Due   Hemoglobin A1C  01/08/2024   Eye exam for diabetics  05/06/2024   Yearly kidney health urinalysis for diabetes  05/23/2024   Yearly kidney function blood test for diabetes  07/07/2024   Complete foot exam   07/07/2024   Lipid (cholesterol) test  07/07/2024   DTaP/Tdap/Td vaccine (3 - Td or Tdap) 11/07/2024   Medicare Annual Wellness Visit  12/15/2024   Pneumococcal Vaccine for age over 20  Completed   HPV Vaccine  Aged Out   Meningitis B Vaccine  Aged Out   Pneumococcal Vaccine  Discontinued   Colon Cancer Screening  Discontinued   COVID-19 Vaccine  Discontinued   Hepatitis C Screening  Discontinued   Zoster (Shingles) Vaccine  Discontinued    Advanced directives: (ACP Link)Information on Advanced Care Planning can be found at [place]  Secretary of [facility] Advance Health Care Directives Advance Health Care Directives. [email or web address]   Advance Care Planning is important because it:  [x]  Makes sure you receive the medical care that is consistent with your values, goals, and preferences  [x]  It provides guidance to your family and loved  ones and reduces their decisional burden about whether or not they are making the right decisions based on your wishes.  Follow the link provided in your after visit summary or read over the paperwork we have mailed to you to help you started getting your Advance Directives in place. If you need assistance in completing these, please reach out to us  so that we can help you!  See attachments for Preventive Care and Fall Prevention Tips.

## 2023-12-30 ENCOUNTER — Encounter: Payer: Self-pay | Admitting: Family Medicine

## 2023-12-30 ENCOUNTER — Other Ambulatory Visit: Payer: Self-pay

## 2023-12-30 ENCOUNTER — Ambulatory Visit: Payer: Self-pay | Admitting: Family Medicine

## 2023-12-30 ENCOUNTER — Ambulatory Visit: Admitting: Family Medicine

## 2023-12-30 ENCOUNTER — Other Ambulatory Visit (HOSPITAL_BASED_OUTPATIENT_CLINIC_OR_DEPARTMENT_OTHER): Payer: Self-pay

## 2023-12-30 VITALS — BP 112/70 | HR 86 | Temp 97.5°F | Resp 16 | Ht 67.0 in | Wt 142.8 lb

## 2023-12-30 DIAGNOSIS — K219 Gastro-esophageal reflux disease without esophagitis: Secondary | ICD-10-CM | POA: Diagnosis not present

## 2023-12-30 DIAGNOSIS — Z0000 Encounter for general adult medical examination without abnormal findings: Secondary | ICD-10-CM | POA: Diagnosis not present

## 2023-12-30 DIAGNOSIS — E782 Mixed hyperlipidemia: Secondary | ICD-10-CM

## 2023-12-30 DIAGNOSIS — E1140 Type 2 diabetes mellitus with diabetic neuropathy, unspecified: Secondary | ICD-10-CM | POA: Diagnosis not present

## 2023-12-30 LAB — HEPATIC FUNCTION PANEL
ALT: 17 U/L (ref 0–53)
AST: 14 U/L (ref 0–37)
Albumin: 4.2 g/dL (ref 3.5–5.2)
Alkaline Phosphatase: 63 U/L (ref 39–117)
Bilirubin, Direct: 0.2 mg/dL (ref 0.0–0.3)
Total Bilirubin: 1.1 mg/dL (ref 0.2–1.2)
Total Protein: 6.6 g/dL (ref 6.0–8.3)

## 2023-12-30 LAB — LIPID PANEL
Cholesterol: 85 mg/dL (ref 0–200)
HDL: 25.8 mg/dL — ABNORMAL LOW (ref >39.00)
LDL Cholesterol: 25 mg/dL (ref 0–99)
NonHDL: 59.62
Total CHOL/HDL Ratio: 3
Triglycerides: 171 mg/dL — ABNORMAL HIGH (ref 0.0–149.0)
VLDL: 34.2 mg/dL (ref 0.0–40.0)

## 2023-12-30 LAB — HEMOGLOBIN A1C: Hgb A1c MFr Bld: 6.6 % — ABNORMAL HIGH (ref 4.6–6.5)

## 2023-12-30 MED ORDER — ATORVASTATIN CALCIUM 40 MG PO TABS
40.0000 mg | ORAL_TABLET | Freq: Every day | ORAL | 3 refills | Status: DC
Start: 2023-12-30 — End: 2024-03-23
  Filled 2023-12-30: qty 90, 90d supply, fill #0

## 2023-12-30 MED ORDER — OMEPRAZOLE 20 MG PO CPDR
20.0000 mg | DELAYED_RELEASE_CAPSULE | Freq: Every day | ORAL | 2 refills | Status: AC | PRN
  Filled 2023-12-30: qty 90, 90d supply, fill #0

## 2023-12-30 MED ORDER — TAMSULOSIN HCL 0.4 MG PO CAPS
0.8000 mg | ORAL_CAPSULE | Freq: Every day | ORAL | 2 refills | Status: AC
  Filled 2023-12-30 – 2024-02-09 (×2): qty 180, 90d supply, fill #0
  Filled 2024-05-07: qty 180, 90d supply, fill #1

## 2023-12-30 MED ORDER — AMLODIPINE BESYLATE 2.5 MG PO TABS
2.5000 mg | ORAL_TABLET | Freq: Every day | ORAL | 3 refills | Status: AC
  Filled 2023-12-30 – 2024-02-16 (×2): qty 90, 90d supply, fill #0
  Filled 2024-05-17: qty 90, 90d supply, fill #1

## 2023-12-30 NOTE — Patient Instructions (Signed)
 Give [place] 2-3 business days to get the results of your labs back.   Keep the diet clean and stay active.  Please get me a copy of your advanced directive form at your convenience.   Let us know if you need anything.

## 2023-12-30 NOTE — Progress Notes (Signed)
 CC: Physical  Well Male Tanner Mason is here for a complete physical.   His last physical was >1 year ago.  Current diet: in general, a healthy diet.   Current exercise: wt training, walking Weight trend: stable Fatigue out of ordinary? No. Seat belt? Yes.   Advanced directive? Yes  Health maintenance Shingrix- No- politely declined Tetanus- Yes Pneumonia vaccine- Yes  Past Medical History:  Diagnosis Date   Abdominal ultrasound, abnormal 02/2004   fatty liver   Anemia    Angioedema 07/13/2014   Arthritis    bilateral thumbs    Cataract    removed on both eyes   Diabetes mellitus without complication (HCC)    DIASTASIS RECTI 12/08/2007   Qualifier: Diagnosis of  By: Levonne MD, Wayne  Present, but causing no problems    Elbow dislocation 8038,8021   Right   History of esophagogastroduodenoscopy 08/13/2007   normal   Hypertension    controlled with medication    Treadmill stress test negative for angina pectoris 06/2002   poor HR and BP recovery   ULNAR NERVE ENTRAPMENT, RIGHT 06/07/2008   Qualifier: Diagnosis of  By: Levonne MD, Lemond       Past Surgical History:  Procedure Laterality Date   ANTERIOR CRUCIATE LIGAMENT REPAIR  5/98   Left, staph infection complicated   BASAL CELL CARCINOMA EXCISION  02/2005   R ear   BLEPHAROPLASTY  03/2005   with ptosis repair   cataract surgery Left 2018   INGUINAL HERNIA REPAIR Right 1947   INGUINAL HERNIA REPAIR Left 1970    Medications  Current Outpatient Medications on File Prior to Visit  Medication Sig Dispense Refill   ACCU-CHEK GUIDE test strip CHECKING FASTING ONCE DAILY 50 strip 7   cetirizine  (ZYRTEC ) 10 MG tablet Take 10 mg by mouth every evening.     empagliflozin  (JARDIANCE ) 25 MG TABS tablet Take 1 tablet (25 mg total) by mouth daily. 90 tablet 2   EPINEPHrine  (EPIPEN  2-PAK) 0.3 mg/0.3 mL IJ SOAJ injection INJECT 0.3MLS INTO THE MUSLE ONCE 1 each 1   finasteride  (PROSCAR ) 5 MG tablet Take 1 tablet (5 mg  total) by mouth daily. 90 tablet 3   insulin  glargine (LANTUS  SOLOSTAR) 100 UNIT/ML Solostar Pen Inject 40 Units into the skin daily. 45 mL 3   Insulin  Pen Needle (BD PEN NEEDLE NANO U/F) 32G X 4 MM MISC Use 2 (two) times daily. 100 each 2   metoprolol  tartrate (LOPRESSOR ) 25 MG tablet Take 3 tablets (75 mg total) by mouth 2 (two) times daily AT 10 AM AND 5 PM. 540 tablet 3   potassium chloride  (KLOR-CON  M) 10 MEQ tablet Take 2 tablets (20 mEq total) by mouth daily. 180 tablet 3   Semaglutide ,0.25 or 0.5MG /DOS, (OZEMPIC , 0.25 OR 0.5 MG/DOSE,) 2 MG/3ML SOPN Inject 0.5 mg into the skin once a week. 9 mL 2   tamsulosin  (FLOMAX ) 0.4 MG CAPS capsule Take 2 capsules (0.8 mg total) by mouth daily. 180 capsule 2   VITAMIN D PO Take 1,000 Units by mouth as needed.     Allergies Allergies  Allergen Reactions   Ace Inhibitors Other (See Comments)    REACTION: Angioedema   Calcium  Carbonate Antacid Other (See Comments)    REACTION: Angioedema of mouth    Family History Family History  Problem Relation Age of Onset   Alzheimer's disease Mother        died age 39   Hypertension Mother    Aortic aneurysm  Father        died age 14   Anuerysm Father    Hypertension Father    Aortic aneurysm Brother        repaired plus AI graft   Diabetes Brother    Heart disease Brother    Stroke Brother     Review of Systems: Constitutional:  no fevers Eye:  no recent significant change in vision Ears:  No changes in hearing Nose/Mouth/Throat:  no complaints of nasal congestion, no sore throat Cardiovascular: no chest pain Respiratory:  No shortness of breath Gastrointestinal:  No change in bowel habits GU:  No frequency Integumentary:  no abnormal skin lesions reported Neurologic:  no headaches Endocrine:  denies unexplained weight changes  Exam BP 112/70 (BP Location: Left Arm, Patient Position: Sitting)   Pulse 86   Temp (!) 97.5 F (36.4 C) (Oral)   Resp 16   Ht 5' 7 (1.702 m)   Wt 142 lb  12.8 oz (64.8 kg)   SpO2 96%   BMI 22.37 kg/m  General:  well developed, well nourished, in no apparent distress Skin:  no significant moles, warts, or growths Head:  no masses, lesions, or tenderness Eyes:  pupils equal and round, sclera anicteric without injection Ears:  canals without lesions, TMs shiny without retraction, no obvious effusion, no erythema Nose:  nares patent, mucosa normal Throat/Pharynx:  lips and gingiva without lesion; tongue and uvula midline; non-inflamed pharynx; no exudates or postnasal drainage Lungs:  clear to auscultation, breath sounds equal bilaterally, no respiratory distress Cardio:  regular rate and rhythm, no LE edema or bruits Rectal: Deferred GI: BS+, S, NT, ND, no masses or organomegaly Musculoskeletal:  symmetrical muscle groups noted without atrophy or deformity Neuro:  gait normal; deep tendon reflexes normal and symmetric Psych: well oriented with normal range of affect and appropriate judgment/insight  Assessment and Plan  Well adult exam  Gastroesophageal reflux disease, unspecified whether esophagitis present - Plan: omeprazole  (PRILOSEC) 20 MG capsule  Type 2 diabetes mellitus with diabetic neuropathy, unspecified whether long term insulin  use (HCC) - Plan: Hepatic function panel, Lipid panel, Hemoglobin A1c   Well 83 y.o. male. Counseled on diet and exercise. Advanced directive form requested today.  Other orders as above. Follow up in 6 mo.  The patient voiced understanding and agreement to the plan.  Mabel Mt Shepherd, DO 12/30/23 10:34 AM

## 2023-12-31 ENCOUNTER — Other Ambulatory Visit: Payer: Self-pay

## 2024-01-02 ENCOUNTER — Other Ambulatory Visit (HOSPITAL_BASED_OUTPATIENT_CLINIC_OR_DEPARTMENT_OTHER): Payer: Self-pay

## 2024-01-02 ENCOUNTER — Other Ambulatory Visit: Payer: Self-pay

## 2024-01-05 ENCOUNTER — Other Ambulatory Visit (HOSPITAL_BASED_OUTPATIENT_CLINIC_OR_DEPARTMENT_OTHER): Payer: Self-pay

## 2024-01-05 ENCOUNTER — Other Ambulatory Visit: Payer: Self-pay | Admitting: Family Medicine

## 2024-01-05 MED ORDER — NEFFY 2 MG/0.1ML NA SOLN
1.0000 | Freq: Once | NASAL | 1 refills | Status: AC | PRN
  Filled 2024-01-05: qty 2, 2d supply, fill #0
  Filled 2024-02-03: qty 2, 30d supply, fill #0

## 2024-01-06 ENCOUNTER — Other Ambulatory Visit (HOSPITAL_BASED_OUTPATIENT_CLINIC_OR_DEPARTMENT_OTHER): Payer: Self-pay

## 2024-01-07 ENCOUNTER — Other Ambulatory Visit (HOSPITAL_BASED_OUTPATIENT_CLINIC_OR_DEPARTMENT_OTHER): Payer: Self-pay

## 2024-01-08 ENCOUNTER — Other Ambulatory Visit (HOSPITAL_BASED_OUTPATIENT_CLINIC_OR_DEPARTMENT_OTHER): Payer: Self-pay

## 2024-01-09 ENCOUNTER — Other Ambulatory Visit (HOSPITAL_BASED_OUTPATIENT_CLINIC_OR_DEPARTMENT_OTHER): Payer: Self-pay

## 2024-01-12 ENCOUNTER — Other Ambulatory Visit (HOSPITAL_BASED_OUTPATIENT_CLINIC_OR_DEPARTMENT_OTHER): Payer: Self-pay

## 2024-01-13 ENCOUNTER — Other Ambulatory Visit (HOSPITAL_BASED_OUTPATIENT_CLINIC_OR_DEPARTMENT_OTHER): Payer: Self-pay

## 2024-01-14 ENCOUNTER — Other Ambulatory Visit (HOSPITAL_BASED_OUTPATIENT_CLINIC_OR_DEPARTMENT_OTHER): Payer: Self-pay

## 2024-01-15 ENCOUNTER — Other Ambulatory Visit (HOSPITAL_BASED_OUTPATIENT_CLINIC_OR_DEPARTMENT_OTHER): Payer: Self-pay

## 2024-01-16 ENCOUNTER — Other Ambulatory Visit (HOSPITAL_BASED_OUTPATIENT_CLINIC_OR_DEPARTMENT_OTHER): Payer: Self-pay

## 2024-01-19 ENCOUNTER — Other Ambulatory Visit (HOSPITAL_BASED_OUTPATIENT_CLINIC_OR_DEPARTMENT_OTHER): Payer: Self-pay

## 2024-01-20 ENCOUNTER — Other Ambulatory Visit (HOSPITAL_BASED_OUTPATIENT_CLINIC_OR_DEPARTMENT_OTHER): Payer: Self-pay

## 2024-01-21 ENCOUNTER — Other Ambulatory Visit (HOSPITAL_BASED_OUTPATIENT_CLINIC_OR_DEPARTMENT_OTHER): Payer: Self-pay

## 2024-01-22 ENCOUNTER — Other Ambulatory Visit (HOSPITAL_BASED_OUTPATIENT_CLINIC_OR_DEPARTMENT_OTHER): Payer: Self-pay

## 2024-01-23 ENCOUNTER — Other Ambulatory Visit (HOSPITAL_BASED_OUTPATIENT_CLINIC_OR_DEPARTMENT_OTHER): Payer: Self-pay

## 2024-01-26 ENCOUNTER — Other Ambulatory Visit (HOSPITAL_BASED_OUTPATIENT_CLINIC_OR_DEPARTMENT_OTHER): Payer: Self-pay

## 2024-01-27 ENCOUNTER — Other Ambulatory Visit (HOSPITAL_BASED_OUTPATIENT_CLINIC_OR_DEPARTMENT_OTHER): Payer: Self-pay

## 2024-01-28 ENCOUNTER — Other Ambulatory Visit (HOSPITAL_BASED_OUTPATIENT_CLINIC_OR_DEPARTMENT_OTHER): Payer: Self-pay

## 2024-02-03 ENCOUNTER — Other Ambulatory Visit (HOSPITAL_BASED_OUTPATIENT_CLINIC_OR_DEPARTMENT_OTHER): Payer: Self-pay

## 2024-02-04 ENCOUNTER — Other Ambulatory Visit (HOSPITAL_BASED_OUTPATIENT_CLINIC_OR_DEPARTMENT_OTHER): Payer: Self-pay

## 2024-02-05 ENCOUNTER — Other Ambulatory Visit (HOSPITAL_BASED_OUTPATIENT_CLINIC_OR_DEPARTMENT_OTHER): Payer: Self-pay

## 2024-02-06 ENCOUNTER — Other Ambulatory Visit (HOSPITAL_BASED_OUTPATIENT_CLINIC_OR_DEPARTMENT_OTHER): Payer: Self-pay

## 2024-02-09 ENCOUNTER — Other Ambulatory Visit (HOSPITAL_BASED_OUTPATIENT_CLINIC_OR_DEPARTMENT_OTHER): Payer: Self-pay

## 2024-02-09 ENCOUNTER — Other Ambulatory Visit (INDEPENDENT_AMBULATORY_CARE_PROVIDER_SITE_OTHER)

## 2024-02-09 ENCOUNTER — Other Ambulatory Visit: Payer: Self-pay

## 2024-02-09 ENCOUNTER — Ambulatory Visit: Payer: Self-pay | Admitting: Family Medicine

## 2024-02-09 DIAGNOSIS — E782 Mixed hyperlipidemia: Secondary | ICD-10-CM

## 2024-02-09 DIAGNOSIS — E1140 Type 2 diabetes mellitus with diabetic neuropathy, unspecified: Secondary | ICD-10-CM

## 2024-02-09 LAB — LIPID PANEL
Cholesterol: 94 mg/dL (ref 0–200)
HDL: 28 mg/dL — ABNORMAL LOW (ref >39.00)
LDL Cholesterol: 8 mg/dL (ref 0–99)
NonHDL: 65.52
Total CHOL/HDL Ratio: 3
Triglycerides: 289 mg/dL — ABNORMAL HIGH (ref 0.0–149.0)
VLDL: 57.8 mg/dL — ABNORMAL HIGH (ref 0.0–40.0)

## 2024-02-10 ENCOUNTER — Other Ambulatory Visit (HOSPITAL_BASED_OUTPATIENT_CLINIC_OR_DEPARTMENT_OTHER): Payer: Self-pay

## 2024-02-10 ENCOUNTER — Other Ambulatory Visit: Payer: Self-pay | Admitting: Family Medicine

## 2024-02-10 MED ORDER — FENOFIBRATE 48 MG PO TABS
48.0000 mg | ORAL_TABLET | Freq: Every day | ORAL | 3 refills | Status: DC
  Filled 2024-02-10: qty 30, 30d supply, fill #0
  Filled 2024-03-04: qty 30, 30d supply, fill #1
  Filled 2024-04-05: qty 30, 30d supply, fill #2
  Filled 2024-04-26 – 2024-04-28 (×3): qty 30, 30d supply, fill #3

## 2024-02-16 ENCOUNTER — Other Ambulatory Visit (HOSPITAL_BASED_OUTPATIENT_CLINIC_OR_DEPARTMENT_OTHER): Payer: Self-pay

## 2024-02-17 ENCOUNTER — Other Ambulatory Visit (HOSPITAL_BASED_OUTPATIENT_CLINIC_OR_DEPARTMENT_OTHER): Payer: Self-pay

## 2024-03-09 ENCOUNTER — Other Ambulatory Visit: Payer: Self-pay

## 2024-03-22 ENCOUNTER — Other Ambulatory Visit (INDEPENDENT_AMBULATORY_CARE_PROVIDER_SITE_OTHER)

## 2024-03-22 ENCOUNTER — Ambulatory Visit: Payer: Self-pay | Admitting: Family Medicine

## 2024-03-22 DIAGNOSIS — E782 Mixed hyperlipidemia: Secondary | ICD-10-CM

## 2024-03-22 DIAGNOSIS — E1140 Type 2 diabetes mellitus with diabetic neuropathy, unspecified: Secondary | ICD-10-CM | POA: Diagnosis not present

## 2024-03-22 LAB — LIPID PANEL
Cholesterol: 97 mg/dL (ref 0–200)
HDL: 28.3 mg/dL — ABNORMAL LOW (ref >39.00)
LDL Cholesterol: 33 mg/dL (ref 0–99)
NonHDL: 68.51
Total CHOL/HDL Ratio: 3
Triglycerides: 180 mg/dL — ABNORMAL HIGH (ref 0.0–149.0)
VLDL: 36 mg/dL (ref 0.0–40.0)

## 2024-03-23 ENCOUNTER — Other Ambulatory Visit: Payer: Self-pay | Admitting: Family Medicine

## 2024-03-23 ENCOUNTER — Other Ambulatory Visit (HOSPITAL_BASED_OUTPATIENT_CLINIC_OR_DEPARTMENT_OTHER): Payer: Self-pay

## 2024-03-23 MED ORDER — ATORVASTATIN CALCIUM 80 MG PO TABS
80.0000 mg | ORAL_TABLET | Freq: Every day | ORAL | 3 refills | Status: AC
Start: 2024-03-23 — End: ?
  Filled 2024-03-23: qty 90, 90d supply, fill #0

## 2024-04-05 ENCOUNTER — Other Ambulatory Visit: Payer: Self-pay | Admitting: Family Medicine

## 2024-04-05 ENCOUNTER — Other Ambulatory Visit (HOSPITAL_BASED_OUTPATIENT_CLINIC_OR_DEPARTMENT_OTHER): Payer: Self-pay

## 2024-04-05 DIAGNOSIS — E1140 Type 2 diabetes mellitus with diabetic neuropathy, unspecified: Secondary | ICD-10-CM

## 2024-04-05 MED ORDER — OZEMPIC (0.25 OR 0.5 MG/DOSE) 2 MG/3ML ~~LOC~~ SOPN
0.5000 mg | PEN_INJECTOR | SUBCUTANEOUS | 2 refills | Status: AC
  Filled 2024-04-05: qty 9, 84d supply, fill #0

## 2024-04-05 MED ORDER — EMPAGLIFLOZIN 25 MG PO TABS
25.0000 mg | ORAL_TABLET | Freq: Every day | ORAL | 2 refills | Status: AC
  Filled 2024-04-05: qty 90, 90d supply, fill #0

## 2024-04-13 ENCOUNTER — Other Ambulatory Visit: Payer: Self-pay

## 2024-04-13 ENCOUNTER — Telehealth: Payer: Self-pay | Admitting: Family Medicine

## 2024-04-13 DIAGNOSIS — E1140 Type 2 diabetes mellitus with diabetic neuropathy, unspecified: Secondary | ICD-10-CM

## 2024-04-13 DIAGNOSIS — N183 Chronic kidney disease, stage 3 unspecified: Secondary | ICD-10-CM

## 2024-04-13 NOTE — Telephone Encounter (Signed)
 Patient came in and needs labs added to list for lab appt in January. He states has an appt with kidney doctor and they need labs done but he would like to do it all together.

## 2024-04-13 NOTE — Telephone Encounter (Signed)
 Patient was called and made aware of labs put in for upcoming lab appointment.

## 2024-04-27 ENCOUNTER — Other Ambulatory Visit (HOSPITAL_BASED_OUTPATIENT_CLINIC_OR_DEPARTMENT_OTHER): Payer: Self-pay

## 2024-04-27 ENCOUNTER — Other Ambulatory Visit: Payer: Self-pay

## 2024-04-28 ENCOUNTER — Other Ambulatory Visit: Payer: Self-pay

## 2024-05-05 ENCOUNTER — Other Ambulatory Visit

## 2024-05-05 DIAGNOSIS — N183 Chronic kidney disease, stage 3 unspecified: Secondary | ICD-10-CM | POA: Diagnosis not present

## 2024-05-05 DIAGNOSIS — E1122 Type 2 diabetes mellitus with diabetic chronic kidney disease: Secondary | ICD-10-CM | POA: Diagnosis not present

## 2024-05-05 DIAGNOSIS — E782 Mixed hyperlipidemia: Secondary | ICD-10-CM

## 2024-05-05 DIAGNOSIS — E1140 Type 2 diabetes mellitus with diabetic neuropathy, unspecified: Secondary | ICD-10-CM

## 2024-05-05 LAB — MICROALBUMIN / CREATININE URINE RATIO
Creatinine,U: 136.9 mg/dL
Microalb Creat Ratio: 27 mg/g (ref 0.0–30.0)
Microalb, Ur: 3.7 mg/dL — ABNORMAL HIGH (ref 0.7–1.9)

## 2024-05-05 LAB — HEPATIC FUNCTION PANEL
ALT: 12 U/L (ref 3–53)
AST: 11 U/L (ref 5–37)
Albumin: 4.1 g/dL (ref 3.5–5.2)
Alkaline Phosphatase: 40 U/L (ref 39–117)
Bilirubin, Direct: 0.3 mg/dL (ref 0.1–0.3)
Total Bilirubin: 1.1 mg/dL (ref 0.2–1.2)
Total Protein: 6.2 g/dL (ref 6.0–8.3)

## 2024-05-05 LAB — LIPID PANEL
Cholesterol: 77 mg/dL (ref 28–200)
HDL: 27.8 mg/dL — ABNORMAL LOW
LDL Cholesterol: 26 mg/dL (ref 10–99)
NonHDL: 48.7
Total CHOL/HDL Ratio: 3
Triglycerides: 113 mg/dL (ref 10.0–149.0)
VLDL: 22.6 mg/dL (ref 0.0–40.0)

## 2024-05-05 LAB — HEMOGLOBIN A1C: Hgb A1c MFr Bld: 6.8 % — ABNORMAL HIGH (ref 4.6–6.5)

## 2024-05-06 ENCOUNTER — Encounter: Payer: Self-pay | Admitting: Family Medicine

## 2024-05-06 LAB — OPHTHALMOLOGY REPORT-SCANNED

## 2024-05-06 LAB — PARATHYROID HORMONE, INTACT (NO CA): PTH: 82 pg/mL — ABNORMAL HIGH (ref 16–77)

## 2024-05-07 ENCOUNTER — Ambulatory Visit: Payer: Self-pay | Admitting: Family Medicine

## 2024-05-07 ENCOUNTER — Encounter: Payer: Self-pay | Admitting: Family Medicine

## 2024-05-07 LAB — PROTEIN / CREATININE RATIO, URINE
Creatinine, Urine: 136 mg/dL (ref 20–320)
Protein/Creat Ratio: 279 mg/g{creat} — ABNORMAL HIGH (ref 25–148)
Protein/Creatinine Ratio: 0.279 mg/mg{creat} — ABNORMAL HIGH (ref 0.025–0.148)
Total Protein, Urine: 38 mg/dL — ABNORMAL HIGH (ref 5–25)

## 2024-05-11 ENCOUNTER — Other Ambulatory Visit (HOSPITAL_BASED_OUTPATIENT_CLINIC_OR_DEPARTMENT_OTHER): Payer: Self-pay

## 2024-05-28 ENCOUNTER — Other Ambulatory Visit (HOSPITAL_BASED_OUTPATIENT_CLINIC_OR_DEPARTMENT_OTHER): Payer: Self-pay

## 2024-05-31 ENCOUNTER — Other Ambulatory Visit: Payer: Self-pay | Admitting: Family Medicine

## 2024-05-31 ENCOUNTER — Other Ambulatory Visit (HOSPITAL_BASED_OUTPATIENT_CLINIC_OR_DEPARTMENT_OTHER): Payer: Self-pay

## 2024-05-31 MED ORDER — FENOFIBRATE 48 MG PO TABS
48.0000 mg | ORAL_TABLET | Freq: Every day | ORAL | 1 refills | Status: AC
  Filled 2024-05-31: qty 90, 90d supply, fill #0

## 2024-06-29 ENCOUNTER — Ambulatory Visit: Admitting: Family Medicine

## 2024-12-21 ENCOUNTER — Ambulatory Visit
